# Patient Record
Sex: Female | Born: 1950
Health system: Southern US, Community
[De-identification: ages and names within clinical notes are randomized; demographics above are authoritative.]

## PROBLEM LIST (undated history)

## (undated) DIAGNOSIS — H409 Unspecified glaucoma: Secondary | ICD-10-CM

## (undated) DIAGNOSIS — G709 Myoneural disorder, unspecified: Secondary | ICD-10-CM

## (undated) DIAGNOSIS — M19042 Primary osteoarthritis, left hand: Secondary | ICD-10-CM

## (undated) DIAGNOSIS — I471 Supraventricular tachycardia: Secondary | ICD-10-CM

## (undated) DIAGNOSIS — M19041 Primary osteoarthritis, right hand: Secondary | ICD-10-CM

## (undated) DIAGNOSIS — D869 Sarcoidosis, unspecified: Secondary | ICD-10-CM

## (undated) DIAGNOSIS — M170 Bilateral primary osteoarthritis of knee: Secondary | ICD-10-CM

## (undated) DIAGNOSIS — R000 Tachycardia, unspecified: Secondary | ICD-10-CM

## (undated) DIAGNOSIS — J45909 Unspecified asthma, uncomplicated: Secondary | ICD-10-CM

## (undated) DIAGNOSIS — H3320 Serous retinal detachment, unspecified eye: Secondary | ICD-10-CM

## (undated) DIAGNOSIS — J9801 Acute bronchospasm: Principal | ICD-10-CM

## (undated) DIAGNOSIS — H209 Unspecified iridocyclitis: Secondary | ICD-10-CM

## (undated) DIAGNOSIS — I10 Essential (primary) hypertension: Secondary | ICD-10-CM

## (undated) DIAGNOSIS — T7840XA Allergy, unspecified, initial encounter: Secondary | ICD-10-CM

## (undated) DIAGNOSIS — M5136 Other intervertebral disc degeneration, lumbar region: Secondary | ICD-10-CM

## (undated) DIAGNOSIS — K219 Gastro-esophageal reflux disease without esophagitis: Secondary | ICD-10-CM

## (undated) DIAGNOSIS — I5189 Other ill-defined heart diseases: Secondary | ICD-10-CM

## (undated) HISTORY — DX: Unspecified glaucoma: H40.9

## (undated) HISTORY — DX: Serous retinal detachment, unspecified eye: H33.20

## (undated) HISTORY — DX: Tachycardia, unspecified: R00.0

## (undated) HISTORY — DX: Sarcoidosis, unspecified: D86.9

## (undated) HISTORY — DX: Myoneural disorder, unspecified: G70.9

## (undated) HISTORY — DX: Other intervertebral disc degeneration, lumbar region: M51.36

## (undated) HISTORY — DX: Primary osteoarthritis, right hand: M19.041

## (undated) HISTORY — DX: Primary osteoarthritis, left hand: M19.042

## (undated) HISTORY — PX: CYSTECTOMY: SUR359

## (undated) HISTORY — DX: Acute bronchospasm: J98.01

## (undated) HISTORY — DX: Supraventricular tachycardia: I47.1

## (undated) HISTORY — DX: Bilateral primary osteoarthritis of knee: M17.0

## (undated) HISTORY — DX: Unspecified iridocyclitis: H20.9

## (undated) HISTORY — DX: Unspecified asthma, uncomplicated: J45.909

## (undated) HISTORY — DX: Essential (primary) hypertension: I10

## (undated) HISTORY — DX: Gastro-esophageal reflux disease without esophagitis: K21.9

## (undated) HISTORY — DX: Allergy, unspecified, initial encounter: T78.40XA

## (undated) HISTORY — DX: Other ill-defined heart diseases: I51.89

---
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
## 1982-10-19 HISTORY — PX: TOTAL ABDOMINAL HYSTERECTOMY: SHX209

## 2000-09-20 ENCOUNTER — Other Ambulatory Visit: Admission: RE | Admit: 2000-09-20 | Discharge: 2000-09-20 | Payer: Self-pay | Admitting: *Deleted

## 2001-01-26 ENCOUNTER — Emergency Department (HOSPITAL_COMMUNITY): Admission: EM | Admit: 2001-01-26 | Discharge: 2001-01-27 | Payer: Self-pay | Admitting: Emergency Medicine

## 2001-01-27 ENCOUNTER — Encounter: Payer: Self-pay | Admitting: Emergency Medicine

## 2001-03-18 ENCOUNTER — Emergency Department (HOSPITAL_COMMUNITY): Admission: EM | Admit: 2001-03-18 | Discharge: 2001-03-18 | Payer: Self-pay | Admitting: Emergency Medicine

## 2001-06-29 ENCOUNTER — Encounter: Payer: Self-pay | Admitting: Chiropractic Medicine

## 2001-06-29 ENCOUNTER — Ambulatory Visit (HOSPITAL_COMMUNITY): Admission: RE | Admit: 2001-06-29 | Discharge: 2001-06-29 | Payer: Self-pay | Admitting: Chiropractic Medicine

## 2003-12-10 ENCOUNTER — Encounter
Admission: RE | Admit: 2003-12-10 | Discharge: 2004-03-09 | Payer: Self-pay | Admitting: Physical Medicine and Rehabilitation

## 2003-12-17 ENCOUNTER — Ambulatory Visit (HOSPITAL_COMMUNITY)
Admission: RE | Admit: 2003-12-17 | Discharge: 2003-12-17 | Payer: Self-pay | Admitting: Physical Medicine and Rehabilitation

## 2004-03-18 ENCOUNTER — Encounter
Admission: RE | Admit: 2004-03-18 | Discharge: 2004-06-16 | Payer: Self-pay | Admitting: Physical Medicine and Rehabilitation

## 2004-07-02 ENCOUNTER — Encounter
Admission: RE | Admit: 2004-07-02 | Discharge: 2004-09-30 | Payer: Self-pay | Admitting: Physical Medicine and Rehabilitation

## 2004-07-03 ENCOUNTER — Ambulatory Visit: Payer: Self-pay | Admitting: Physical Medicine & Rehabilitation

## 2004-08-07 ENCOUNTER — Ambulatory Visit: Payer: Self-pay | Admitting: Physical Medicine and Rehabilitation

## 2004-08-19 ENCOUNTER — Encounter
Admission: RE | Admit: 2004-08-19 | Discharge: 2004-11-17 | Payer: Self-pay | Admitting: Physical Medicine and Rehabilitation

## 2004-10-29 ENCOUNTER — Encounter
Admission: RE | Admit: 2004-10-29 | Discharge: 2005-01-27 | Payer: Self-pay | Admitting: Physical Medicine and Rehabilitation

## 2004-10-31 ENCOUNTER — Ambulatory Visit: Payer: Self-pay | Admitting: Physical Medicine and Rehabilitation

## 2004-11-11 ENCOUNTER — Encounter
Admission: RE | Admit: 2004-11-11 | Discharge: 2004-11-11 | Payer: Self-pay | Admitting: Physical Medicine and Rehabilitation

## 2005-02-24 ENCOUNTER — Encounter
Admission: RE | Admit: 2005-02-24 | Discharge: 2005-05-25 | Payer: Self-pay | Admitting: Physical Medicine and Rehabilitation

## 2005-02-26 ENCOUNTER — Ambulatory Visit: Payer: Self-pay | Admitting: Physical Medicine and Rehabilitation

## 2005-02-26 ENCOUNTER — Ambulatory Visit: Payer: Self-pay | Admitting: Internal Medicine

## 2005-03-05 ENCOUNTER — Ambulatory Visit (HOSPITAL_COMMUNITY): Admission: RE | Admit: 2005-03-05 | Discharge: 2005-03-05 | Payer: Self-pay | Admitting: Internal Medicine

## 2005-03-19 ENCOUNTER — Ambulatory Visit: Payer: Self-pay | Admitting: Internal Medicine

## 2005-04-29 ENCOUNTER — Ambulatory Visit: Payer: Self-pay | Admitting: Internal Medicine

## 2005-05-06 ENCOUNTER — Ambulatory Visit: Payer: Self-pay

## 2005-05-18 ENCOUNTER — Encounter
Admission: RE | Admit: 2005-05-18 | Discharge: 2005-08-16 | Payer: Self-pay | Admitting: Physical Medicine and Rehabilitation

## 2005-06-13 ENCOUNTER — Emergency Department (HOSPITAL_COMMUNITY): Admission: EM | Admit: 2005-06-13 | Discharge: 2005-06-13 | Payer: Self-pay | Admitting: Family Medicine

## 2005-07-08 ENCOUNTER — Ambulatory Visit: Payer: Self-pay | Admitting: Physical Medicine and Rehabilitation

## 2005-09-18 ENCOUNTER — Ambulatory Visit: Payer: Self-pay | Admitting: Internal Medicine

## 2005-10-01 ENCOUNTER — Encounter
Admission: RE | Admit: 2005-10-01 | Discharge: 2005-12-30 | Payer: Self-pay | Admitting: Physical Medicine and Rehabilitation

## 2005-12-01 ENCOUNTER — Ambulatory Visit: Payer: Self-pay | Admitting: Physical Medicine and Rehabilitation

## 2006-01-07 ENCOUNTER — Emergency Department (HOSPITAL_COMMUNITY): Admission: EM | Admit: 2006-01-07 | Discharge: 2006-01-07 | Payer: Self-pay | Admitting: Emergency Medicine

## 2006-02-23 ENCOUNTER — Encounter
Admission: RE | Admit: 2006-02-23 | Discharge: 2006-05-24 | Payer: Self-pay | Admitting: Physical Medicine and Rehabilitation

## 2006-02-23 ENCOUNTER — Ambulatory Visit: Payer: Self-pay | Admitting: Physical Medicine and Rehabilitation

## 2006-04-03 ENCOUNTER — Emergency Department (HOSPITAL_COMMUNITY): Admission: EM | Admit: 2006-04-03 | Discharge: 2006-04-03 | Payer: Self-pay | Admitting: Emergency Medicine

## 2006-04-14 ENCOUNTER — Ambulatory Visit: Payer: Self-pay | Admitting: Physical Medicine and Rehabilitation

## 2006-05-25 ENCOUNTER — Encounter
Admission: RE | Admit: 2006-05-25 | Discharge: 2006-08-23 | Payer: Self-pay | Admitting: Physical Medicine & Rehabilitation

## 2007-10-20 HISTORY — PX: LUMBAR SPINE SURGERY: SHX701

## 2008-10-15 ENCOUNTER — Encounter: Admission: RE | Admit: 2008-10-15 | Discharge: 2008-10-15 | Payer: Self-pay | Admitting: Orthopedic Surgery

## 2008-12-27 ENCOUNTER — Ambulatory Visit (HOSPITAL_COMMUNITY): Admission: RE | Admit: 2008-12-27 | Discharge: 2008-12-28 | Payer: Self-pay | Admitting: Orthopedic Surgery

## 2009-03-02 ENCOUNTER — Ambulatory Visit: Payer: Self-pay | Admitting: Diagnostic Radiology

## 2009-03-02 ENCOUNTER — Emergency Department (HOSPITAL_BASED_OUTPATIENT_CLINIC_OR_DEPARTMENT_OTHER): Admission: EM | Admit: 2009-03-02 | Discharge: 2009-03-02 | Payer: Self-pay | Admitting: Emergency Medicine

## 2009-06-07 ENCOUNTER — Emergency Department (HOSPITAL_BASED_OUTPATIENT_CLINIC_OR_DEPARTMENT_OTHER): Admission: EM | Admit: 2009-06-07 | Discharge: 2009-06-07 | Payer: Self-pay | Admitting: Emergency Medicine

## 2009-10-19 HISTORY — PX: ROTATOR CUFF REPAIR: SHX139

## 2010-11-08 ENCOUNTER — Encounter: Payer: Self-pay | Admitting: Physical Medicine and Rehabilitation

## 2011-01-29 LAB — URINALYSIS, ROUTINE W REFLEX MICROSCOPIC
Nitrite: NEGATIVE
Protein, ur: NEGATIVE mg/dL
Urobilinogen, UA: 1 mg/dL (ref 0.0–1.0)

## 2011-01-29 LAB — PROTIME-INR
INR: 0.9 (ref 0.00–1.49)
Prothrombin Time: 11.9 seconds (ref 11.6–15.2)

## 2011-01-29 LAB — COMPREHENSIVE METABOLIC PANEL
AST: 24 U/L (ref 0–37)
CO2: 27 mEq/L (ref 19–32)
Calcium: 9.4 mg/dL (ref 8.4–10.5)
Creatinine, Ser: 0.77 mg/dL (ref 0.4–1.2)
GFR calc Af Amer: >60 mL/min (ref >60)
GFR calc non Af Amer: >60 mL/min (ref >60)

## 2011-01-29 LAB — DIFFERENTIAL
Eosinophils Relative: 1 % (ref 0–5)
Lymphocytes Relative: 33 % (ref 12–46)
Lymphs Abs: 2 10*3/uL (ref 0.7–4.0)

## 2011-01-29 LAB — URINE CULTURE
Colony Count: 4000
Special Requests: NEGATIVE

## 2011-01-29 LAB — CBC
MCHC: 33.5 g/dL (ref 30.0–36.0)
MCV: 88.8 fL (ref 78.0–100.0)
Platelets: 299 10*3/uL (ref 150–400)
RBC: 4.53 MIL/uL (ref 3.87–5.11)

## 2011-01-29 LAB — APTT: aPTT: 26 seconds (ref 24–37)

## 2011-03-03 NOTE — Op Note (Signed)
NAMEKARNISHA, LEFEBRE       ACCOUNT NO.:  [id number]   MEDICAL RECORD NO.:  [id number]          PATIENT TYPE:  AMB   LOCATION:  DAY                          FACILITY:  [facility]   PHYSICIAN:  Georges Lynch. Gioffre, M.D.DATE OF BIRTH:  [birth year]   DATE OF PROCEDURE:  12/27/2008  DATE OF DISCHARGE:                               OPERATIVE REPORT   SURGEON:  Georges Lynch. Darrelyn Hillock, M.D.   ASSISTANT:  Jene Every, M.D.   PREOPERATIVE DIAGNOSES:  Spinal stenosis at L4-5 with severe left leg  pain only.  Note there was a questionable ganglion cyst.   POSTOPERATIVE DIAGNOSES:  Spinal stenosis at L4-5 with severe left leg  pain only.  Note there was a questionable ganglion cyst.  Note, spinal  stenosis at L4-5 with facet overgrowth, and there was no ganglion cyst.   OPERATION:  1. Decompressive lumbar laminectomy at L4-5.  2. Foraminotomies at L4-5 of the 4 and 5 roots.   PROCEDURE:  Under general anesthesia, routine orthopedic prep and  draping of the lower back carried out.  She had 1 g of IV Ancef.  Two  needles were placed in the back for localization purposes and an x-ray  was taken.  At this time an incision was made over the L4-5, L5-S1  space.  Bleeders were identified and cauterized.  The muscle was  stripped from the lamina at the spinous process bilaterally.  Another x-  ray was taken to verify the position.  I then inserted McCullough  retractors and identified the L4 and the L5 space and I then carried out  a central decompressive lumbar laminectomy at L4-5 in the usual fashion.  I brought the microscope in, went down and excised the ligamentum  flavum.  Great care was taken not to injure the underlying dura.  Note,  we first went far out the lateral recess to make sure we had a nice  decompression.  She had marked facet overgrowth at that time.  There was  no ganglion cyst that was thought to the earlier.  I think this is just  a defect from the facet.  Once we had totally  decompressed it  bilaterally, we did foraminotomies.  There was a question about going  down to L5-S1 that appeared on 1 view to be a small defect tear, but on  the other views the S1 root filled out quite nicely, but we were able to  easily pass a hockey-stick down after we did our decompression, all the  way down to the S1 root, and it was totally free, so we did not go into  the L5-S1 space on the left.  We went up proximally, distally, medially  and laterally with a hockey-stick.  We explored the disk.  There was no  disk herniation, and we thoroughly irrigated out the area of the dura  now and roots were free, and we felt that that was a good decompression  that we did at this time, but we did take an x-ray to make sure we were  at the 4-5 space.  That was the third x-ray.  Thoroughly irrigated out  the area,  loosely applied some thrombin-soaked Gelfoam.  I closed the  wound in layers in the usual fashion, except I left the  small deep distal part of the wound open for drainage purposes.  The  skin was closed with metal staples.  Sterile Neosporin dressing was  applied.  The patient left the operating room in satisfactory condition.  Surgeon Dr. Darrelyn Hillock, assistant Dr. Jene Every.           ______________________________  Georges Lynch. Darrelyn Hillock, M.D.     RAG/MEDQ  D:  12/27/2008  T:  12/27/2008  Job:  [id number]

## 2011-03-06 NOTE — Assessment & Plan Note (Signed)
HISTORY:  [name] is a 60 year old black female who is being seen in our  pain and rehabilitative clinic for symptoms related to mild spinal stenosis,  facet arthropathy, short pedicles.   She also has bilateral, predominately left knee pain for which she is being  seen by Dr. Cornelius Moras over at [facility].   She states she has recently had two knee injections.  They have helped her  somewhat.  Also, she has had MRI of bilateral knees and was told she has  arthritis without any meniscal issues, apparently.  This is per her history  today.  I do not have notes regarding this from Dr. Cornelius Moras.   Her average pain is about a 6, mainly localized to the low back, slightly  into the left buttock and hip region.  Pain is typically worse with walking,  standing, sitting.  She gets fair relief with the current meds that she is  on.  Pain is described as aching, dull in nature.   She can walk about 60 minutes at a time.  She is able to drive.  She is  working 40 hours a week.   No new changes in past medical, social, or family history since our last  visit.   EXAMINATION:  VITAL SIGNS:  Blood pressure 140/88, pulse 90, respirations  16, 100% saturation on room air.  GENERAL:  She is a well-developed, well-nourished female who appears her  stated age.  She is oriented x 3.  Her affect is alert, cooperative, and  pleasant.   She transitions from sit to stand without any difficulty.  Her gait is  stable in the room.  Normal heel-toe mechanics are appreciated.   Limitations are noted in lumbar range of motion in all planes.   Reflexes are symmetric and intact in the upper and lower extremities.  Motor  strength is good in the lower extremities.  Straight leg raise is negative.  No abnormal sensory deficits are appreciated.   IMPRESSION:  1.  Chronic low back pain.  2.  History of early spinal stenosis, facet arthropathy, short pedicles.  3.  No evidence of trochanteric bursitis  today.   PLAN:  Will continue the patient on Ultracet on a p.r.n. basis.  She really  takes Ultracet rather infrequently, up to two times a week, and  amitriptyline she uses about twice a week, as well.  She does use a heating  pad.  She states TENS unit has not worked well for her and she does  occasionally use Lidoderm.   For the low back and hip pain, she has undergone L4 epidural injection in  the past.  Her last injection was approximately two years ago.  She is  requesting repeat injection and we will go ahead and have this set up for  her for that left buttock and hip pain.  Will see her back in two months.           ______________________________  Brantley Stage, M.D.    DMK/MedQ  D:  04/19/2006 11:25:02  T:  04/19/2006 11:59:34  Job #:  [id number]

## 2011-03-06 NOTE — Assessment & Plan Note (Signed)
MEDICAL RECORD NUMBER:  [id number]   DATE OF BIRTH:  05-27-1951   REASON FOR EVALUATION:  [name] is a 60 year old black female who is being  seen in our pain and rehabilitative clinic for symptoms related to her mild  spinal stenosis, facet arthropathy, short pedicles; she also has some knee  pain which is being worked up currently by Dr. Charlann Boxer over at [facility].  She has recently had a steroid injection to the knee.   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   She is requesting just a refill on her medications.  She has been using  Lidoderm, occasionally some Elavil, as well as an occasional tramadol.  Over  the last couple of weeks, she has experienced a bit of a worsening in her  low back and left buttock symptoms, averaging about an 8 on a scale of 10.  She describes it as constant aching.  Pain is typically worse when she is up  walking and standing, improves a bit when she is at rest.   FUNCTIONAL STATUS:  Her functional status is very high.  She is independent  with all of her ADLs.  She works 40 hours a week; she is a Child psychotherapist.  She is able to climb stairs and drive.   REVIEW OF SYSTEMS:  No problems regarding review of systems today.   PAST MEDICAL HISTORY:  No new changes since her last visit, which was  December 02, 2005.   SOCIAL HISTORY:  No new changes since her last visit, which was December 02, 2005.   FAMILY HISTORY:  No new changes since her last visit, which was December 02, 2005.   PHYSICAL EXAMINATION:  VITAL SIGNS:  On exam today, blood pressure 110/61,  pulse 86, respirations 16, saturated 99% on room air.  GENERAL:  She is a pleasant black female who appears her stated age.  She is  oriented x3.  Her affect is quite bright, very personable and appropriate.  NEUROMUSCULAR:  She transitions from sit to stand without any difficulty.  She does not display any antalgic gait today.  She  has mild limitations in  lumbar range of motion with extension and end-range forward flexion.  Lateral flexion does not seem to bother her today.   Seated reflexes are 1+ at the patellar tendons, 0 to 1+ at the Achilles  tendons.  No abnormal tone is noted.  Motor strength is good in both lower  extremities.   IMPRESSION:  1.  Chronic low back pain.  2.  History of early spinal stenosis, facet arthropathy, short pedicles.  3.  No evidence of significant trochanteric bursitis or iliotibial band      syndrome on exam today.  No tenderness was palpated over the trochanter      on either right or left.   PLAN:  I will refill the patient's Ultracet.  We will increase it slightly,  one to two p.o. b.i.d. p.r.n. low back and left hip pain, #120.  We will see  her back in 2 months.  If she has continuing worsening in her pain  inventory, may consider epidural injections; she will assess this over the  upcoming months.           ______________________________  Brantley Stage, M.D.     DMK/MedQ  D:  02/24/2006 09:28:55  T:  02/25/2006 05:45:30  Job #:  [id number]

## 2011-06-01 ENCOUNTER — Ambulatory Visit (INDEPENDENT_AMBULATORY_CARE_PROVIDER_SITE_OTHER): Payer: Self-pay

## 2011-06-01 DIAGNOSIS — K5900 Constipation, unspecified: Secondary | ICD-10-CM

## 2011-06-15 ENCOUNTER — Ambulatory Visit (INDEPENDENT_AMBULATORY_CARE_PROVIDER_SITE_OTHER): Payer: Self-pay

## 2011-06-15 DIAGNOSIS — K5900 Constipation, unspecified: Secondary | ICD-10-CM

## 2011-07-13 ENCOUNTER — Ambulatory Visit: Payer: Self-pay

## 2011-07-20 ENCOUNTER — Other Ambulatory Visit: Payer: Self-pay | Admitting: Gastroenterology

## 2011-07-27 ENCOUNTER — Ambulatory Visit
Admission: RE | Admit: 2011-07-27 | Discharge: 2011-07-27 | Disposition: A | Discharge status: Home / Self Care | Payer: 59 | Source: Ambulatory Visit | Attending: Gastroenterology | Admitting: Gastroenterology

## 2011-08-10 ENCOUNTER — Ambulatory Visit (INDEPENDENT_AMBULATORY_CARE_PROVIDER_SITE_OTHER): Payer: Self-pay

## 2011-08-10 DIAGNOSIS — K5900 Constipation, unspecified: Secondary | ICD-10-CM

## 2011-09-07 ENCOUNTER — Ambulatory Visit (INDEPENDENT_AMBULATORY_CARE_PROVIDER_SITE_OTHER): Payer: Self-pay

## 2011-09-07 DIAGNOSIS — K5900 Constipation, unspecified: Secondary | ICD-10-CM

## 2011-09-21 ENCOUNTER — Ambulatory Visit (INDEPENDENT_AMBULATORY_CARE_PROVIDER_SITE_OTHER): Payer: Self-pay

## 2011-09-21 DIAGNOSIS — K5900 Constipation, unspecified: Secondary | ICD-10-CM

## 2011-11-09 ENCOUNTER — Ambulatory Visit: Payer: Self-pay

## 2011-11-16 ENCOUNTER — Ambulatory Visit (INDEPENDENT_AMBULATORY_CARE_PROVIDER_SITE_OTHER): Payer: Self-pay

## 2011-11-16 DIAGNOSIS — K5900 Constipation, unspecified: Secondary | ICD-10-CM

## 2011-11-30 ENCOUNTER — Ambulatory Visit: Payer: Self-pay

## 2011-12-14 ENCOUNTER — Ambulatory Visit: Payer: Self-pay

## 2011-12-28 ENCOUNTER — Ambulatory Visit: Payer: Self-pay

## 2012-01-25 ENCOUNTER — Other Ambulatory Visit: Payer: Self-pay

## 2012-02-22 ENCOUNTER — Ambulatory Visit: Payer: Self-pay

## 2012-04-13 ENCOUNTER — Emergency Department (HOSPITAL_BASED_OUTPATIENT_CLINIC_OR_DEPARTMENT_OTHER)
Admission: EM | Admit: 2012-04-13 | Discharge: 2012-04-13 | Disposition: A | Discharge status: Home / Self Care | Payer: 59 | Attending: Emergency Medicine | Admitting: Emergency Medicine

## 2012-04-13 ENCOUNTER — Encounter (HOSPITAL_BASED_OUTPATIENT_CLINIC_OR_DEPARTMENT_OTHER): Payer: Self-pay | Admitting: *Deleted

## 2012-04-13 ENCOUNTER — Emergency Department (HOSPITAL_BASED_OUTPATIENT_CLINIC_OR_DEPARTMENT_OTHER): Payer: 59

## 2012-04-13 DIAGNOSIS — F411 Generalized anxiety disorder: Secondary | ICD-10-CM | POA: Insufficient documentation

## 2012-04-13 DIAGNOSIS — R079 Chest pain, unspecified: Secondary | ICD-10-CM | POA: Insufficient documentation

## 2012-04-13 DIAGNOSIS — R0789 Other chest pain: Secondary | ICD-10-CM | POA: Insufficient documentation

## 2012-04-13 DIAGNOSIS — R000 Tachycardia, unspecified: Secondary | ICD-10-CM | POA: Insufficient documentation

## 2012-04-13 DIAGNOSIS — R911 Solitary pulmonary nodule: Secondary | ICD-10-CM | POA: Insufficient documentation

## 2012-04-13 LAB — URINALYSIS, ROUTINE W REFLEX MICROSCOPIC
Bilirubin Urine: NEGATIVE
Glucose, UA: NEGATIVE mg/dL
Hgb urine dipstick: NEGATIVE
Ketones, ur: NEGATIVE mg/dL
Protein, ur: NEGATIVE mg/dL
pH: 5.5 (ref 5.0–8.0)

## 2012-04-13 LAB — COMPREHENSIVE METABOLIC PANEL
ALT: 17 U/L (ref 0–35)
Albumin: 4.6 g/dL (ref 3.5–5.2)
Alkaline Phosphatase: 69 U/L (ref 39–117)
BUN: 12 mg/dL (ref 6–23)
Chloride: 105 mEq/L (ref 96–112)
Potassium: 3.7 mEq/L (ref 3.5–5.1)
Sodium: 140 mEq/L (ref 135–145)
Total Bilirubin: 0.3 mg/dL (ref 0.3–1.2)
Total Protein: 7.6 g/dL (ref 6.0–8.3)

## 2012-04-13 LAB — CBC
HCT: 36.4 % (ref 36.0–46.0)
Hemoglobin: 12.5 g/dL (ref 12.0–15.0)
RDW: 15.1 % (ref 11.5–15.5)
WBC: 8.1 10*3/uL (ref 4.0–10.5)

## 2012-04-13 MED ORDER — NITROGLYCERIN 0.4 MG SL SUBL
0.4000 mg | SUBLINGUAL_TABLET | Freq: Once | SUBLINGUAL | Status: AC
  Administered 2012-04-13: 0.4 mg via SUBLINGUAL
  Filled 2012-04-13: qty 25

## 2012-04-13 MED ORDER — SODIUM CHLORIDE 0.9 % IV BOLUS (SEPSIS)
1000.0000 mL | Freq: Once | INTRAVENOUS | Status: AC
  Administered 2012-04-13: 1000 mL via INTRAVENOUS

## 2012-04-13 MED ORDER — ASPIRIN 81 MG PO CHEW
324.0000 mg | CHEWABLE_TABLET | Freq: Once | ORAL | Status: AC
  Administered 2012-04-13: 324 mg via ORAL
  Filled 2012-04-13: qty 4

## 2012-04-13 MED ORDER — IOHEXOL 350 MG/ML SOLN
80.0000 mL | Freq: Once | INTRAVENOUS | Status: AC | PRN
  Administered 2012-04-13: 80 mL via INTRAVENOUS

## 2012-04-13 MED ORDER — NITROGLYCERIN 0.4 MG SL SUBL
0.4000 mg | SUBLINGUAL_TABLET | SUBLINGUAL | Status: DC | PRN
  Administered 2012-04-13: 0.4 mg via SUBLINGUAL
  Filled 2012-04-13: qty 25

## 2012-04-13 NOTE — Discharge Instructions (Signed)
Chest Pain (Nonspecific) It is often hard to give a specific diagnosis for the cause of chest pain. There is always a chance that your pain could be related to something serious, such as a heart attack or a blood clot in the lungs. You need to follow up with your caregiver for further evaluation. CAUSES   Heartburn.   Pneumonia or bronchitis.   Anxiety or stress.   Inflammation around your heart (pericarditis) or lung (pleuritis or pleurisy).   A blood clot in the lung.   A collapsed lung (pneumothorax). It can develop suddenly on its own (spontaneous pneumothorax) or from injury (trauma) to the chest.   Shingles infection (herpes zoster virus).  The chest wall is composed of bones, muscles, and cartilage. Any of these can be the source of the pain.  The bones can be bruised by injury.   The muscles or cartilage can be strained by coughing or overwork.   The cartilage can be affected by inflammation and become sore (costochondritis).  DIAGNOSIS  Lab tests or other studies, such as X-rays, electrocardiography, stress testing, or cardiac imaging, may be needed to find the cause of your pain.  TREATMENT   Treatment depends on what may be causing your chest pain. Treatment may include:   Acid blockers for heartburn.   Anti-inflammatory medicine.   Pain medicine for inflammatory conditions.   Antibiotics if an infection is present.   You may be advised to change lifestyle habits. This includes stopping smoking and avoiding alcohol, caffeine, and chocolate.   You may be advised to keep your head raised (elevated) when sleeping. This reduces the chance of acid going backward from your stomach into your esophagus.   Most of the time, nonspecific chest pain will improve within 2 to 3 days with rest and mild pain medicine.  HOME CARE INSTRUCTIONS   If antibiotics were prescribed, take your antibiotics as directed. Finish them even if you start to feel better.   For the next few  days, avoid physical activities that bring on chest pain. Continue physical activities as directed.   Do not smoke.   Avoid drinking alcohol.   Only take over-the-counter or prescription medicine for pain, discomfort, or fever as directed by your caregiver.   Follow your caregiver's suggestions for further testing if your chest pain does not go away.   Keep any follow-up appointments you made. If you do not go to an appointment, you could develop lasting (chronic) problems with pain. If there is any problem keeping an appointment, you must call to reschedule.  SEEK MEDICAL CARE IF:   You think you are having problems from the medicine you are taking. Read your medicine instructions carefully.   Your chest pain does not go away, even after treatment.   You develop a rash with blisters on your chest.  SEEK IMMEDIATE MEDICAL CARE IF:   You have increased chest pain or pain that spreads to your arm, neck, jaw, back, or abdomen.   You develop shortness of breath, an increasing cough, or you are coughing up blood.   You have severe back or abdominal pain, feel nauseous, or vomit.   You develop severe weakness, fainting, or chills.   You have a fever.  THIS IS AN EMERGENCY. Do not wait to see if the pain will go away. Get medical help at once. Call your local emergency services (911 in [place] Do not drive yourself to the hospital. MAKE SURE YOU:   Understand these instructions.     Will watch your condition.   Will get help right away if you are not doing well or get worse.  Document Released: 07/15/2005 Document Revised: 09/24/2011 Document Reviewed: 05/10/2008 [employer] Patient Information 2012 [employer]. 

## 2012-04-13 NOTE — ED Notes (Signed)
Pt reports chest pain and the sensation of her heart beating fast x 3 days. Pain is intermittant, with some sensation of "like I can't catch my breath". ekg performed while pt being triaged. Dr. Alto Denver at bedside for exam.

## 2012-04-13 NOTE — ED Provider Notes (Signed)
History     CSN: [id number]  Arrival date & time 04/13/12  1239   First MD Initiated Contact with Patient 04/13/12 1256      Chief Complaint  Patient presents with  . Tachycardia  . Chest Pain    (Consider location/radiation/quality/duration/timing/severity/associated sxs/prior treatment) HPI Patient is a [age]-year-old female who presents today complaining of 3 days of intermittent episodes of chest discomfort localized underneath the left breast. Patient reports this is worse with a deep breath. Episodes last only a few minutes. She has history of hypertension but no history of hypercholesterolemia, diabetes, or CAD. Patient has seen a cardiologist in the past for tachycardia but this is under control at this time. Patient reports that normally she has these episodes she is told to come to the cardiologist if they're greater than 125. Normally patient can perform Valsalva maneuver and these episodes stopped. Patient has not had a previous stress test or catheterization. She is unaware of her family history she is adopted. She's not a smoker or former smoker. Patient has no history of DVT or PE. She's not on exogenous estrogen. Patient is not on anything that makes her symptoms better and she reports that they feel mainly like she has some gas in her stomach that she denies abdominal pain, nausea, or vomiting. Patient has tried TUMS in addition to her dexilent for GERD without resolution.There are no other associated or modifying factors.  Past Medical History  Diagnosis Date  . Diabetes mellitus diet controlled per pt.    History reviewed. No pertinent past surgical history.  No family history on file.  History  Substance Use Topics  . Smoking status: Never Smoker   . Smokeless tobacco: Not on file  . Alcohol Use:     OB History    Grav Para Term Preterm Abortions TAB SAB Ect Mult Living                  Review of Systems  Constitutional: Negative.   HENT: Negative.     Eyes: Negative.   Respiratory: Negative.   Cardiovascular: Positive for chest pain.  Gastrointestinal: Negative.   Genitourinary: Negative.   Musculoskeletal: Negative.   Skin: Negative.   Neurological: Negative.   Hematological: Negative.   Psychiatric/Behavioral: The patient is nervous/anxious.   All other systems reviewed and are negative.    Allergies  Codeine; Erythromycin; and Sulfa antibiotics  Home Medications   Current Outpatient Rx  Name Route Sig Dispense Refill  . DEXLANSOPRAZOLE 60 MG PO CPDR Oral Take 60 mg by mouth daily.    Marland Kitchen DOXYCYCLINE HYCLATE 100 MG PO CPEP Oral Take 100 mg by mouth 2 (two) times daily.    Marland Kitchen NAPROXEN SODIUM 220 MG PO TABS Oral Take 220 mg by mouth 2 (two) times daily with a meal. Back pain    . OLMESARTAN MEDOXOMIL 20 MG PO TABS Oral Take 20 mg by mouth daily.    . TRAMADOL HCL 50 MG PO TABS Oral Take 50 mg by mouth every 6 (six) hours as needed. Back pain      BP 156/74  Pulse 97  Temp 97.8 F (36.6 C) (Oral)  Resp 18  Ht 5\' 4"  (1.626 m)  Wt 143 lb (64.864 kg)  BMI 24.55 kg/m2  SpO2 100%  Physical Exam  Nursing note and vitals reviewed. GEN: Well-developed, well-nourished female in no distress HEENT: Atraumatic, normocephalic.  EYES: PERRLA BL, no scleral icterus. NECK: Trachea midline, no meningismus CV: Though tachycardic at triage  patient is no longer tachycardic and has regular rhythm. No murmurs, rubs, or gallops PULM: No respiratory distress.  No crackles, wheezes, or rales. GI: soft, non-tender. No guarding, rebound, or tenderness. + bowel sounds  GU: deferred Neuro: cranial nerves grossly 2-12 intact, no abnormalities of strength or sensation, A and O x 3 MSK: Patient moves all 4 extremities symmetrically, no deformity, edema, or injury noted Skin: No rashes petechiae, purpura, or jaundice Psych: Patient is anxious appearing and agrees that she is currently anxious.  ED Course  Procedures (including critical care  time)   Indication: Chest pain Please note this EKG was reviewed extemporaneously by myself.   Date: 04/13/2012  Rate: 118  Rhythm: sinus tachycardia  QRS Axis: normal  Intervals: normal  ST/T Wave abnormalities: nonspecific T wave changes  Conduction Disutrbances:none  Narrative Interpretation:   Old EKG Reviewed: none available       Labs Reviewed  COMPREHENSIVE METABOLIC PANEL - Abnormal; Notable for the following:    Glucose, Bld 105 (*)     GFR calc non Af Amer 79 (*)     All other components within normal limits  CBC  URINALYSIS, ROUTINE W REFLEX MICROSCOPIC  TROPONIN I  TROPONIN I   Dg Chest 2 View  04/13/2012  *RADIOLOGY REPORT*  Clinical Data: Left chest pain radiating to the upper back.  CHEST - 2 VIEW  Comparison: None.  Findings: Vague density in the right upper chest could represent a summation shadow or tissues of the right chest wall, and is less likely to represent an intrapulmonary lesion.  The left lung appears clear.  Cardiac and mediastinal contours appear unremarkable.  No pleural effusion noted.  IMPRESSION:  1.  Vague density projects over the right upper lobe on the frontal projection.  This is subtle and probably a summation shadow or due to soft tissues of the chest wall.  I do recommend an apical lordotic view of the chest tube confirm the benign/incidental nature of this density.  Original Report Authenticated By: Dellia Cloud, M.D.   Ct Angio Chest W/cm &/or Wo Cm  04/13/2012  *RADIOLOGY REPORT*  Clinical Data: Chest pain  CT ANGIOGRAPHY CHEST  Technique:  Multidetector CT imaging of the chest using the standard protocol during bolus administration of intravenous contrast. Multiplanar reconstructed images including MIPs were obtained and reviewed to evaluate the vascular anatomy.  Contrast: 80mL OMNIPAQUE IOHEXOL 350 MG/ML SOLN  Comparison: None.  Findings: There are no filling defects in the pulmonary arterial tree to suggest acute pulmonary  thromboembolism.  Sub centimeter short axis diameter mediastinal and hilar nodes are present. There is a 12 mm short axis diameter precarinal node.  11 mm short axis diameter subcarinal node.  Small axillary nodes are noted.  Small pleural nodes.    Small ill-defined round opacities in the posterior right upper lobe are noted.  This corresponds to the radiographic abnormality. Some of these areas are nodular.  None are greater than 5 mm.  Dependent atelectasis is associated towards the lung bases.  No acute bony deformity.  IMPRESSION: No evidence of acute pulmonary thromboembolism.  Ill-defined right upper lobe nodular opacities less than 5 mm and borderline mediastinal adenopathy.  Findings may represent atypical infection however noninfectious inflammatory disease such as sarcoidosis is in the differential diagnosis.  Follow-up scan in 6 months is recommended to ensure resolution of these findings. Further testing can be performed as clinically indicated.  Original Report Authenticated By: Donavan Burnet, M.D.  1. Atypical chest pain       MDM  Patient was evaluated by myself. Based on presentation patient did have workup for possible atypical chest pain. Patient had an EKG that was unremarkable aside from tachycardia. Tachycardia had resolved by the time I entered the room. Patient reported that her anxiety was improving at that point. Patient was given aspirin 325 mg by mouth. Patient was given a dose of nitroglycerin and her pain improved with this the patient felt that her "settling herself down" is actually what improve this. Patient required no additional medications. Laboratory workup was unremarkable with no evidence of leukocytosis or anemia. Patient also had normal renal function and negative troponins at 0 and 3 hours. Patient did have finding on chest x-ray was concerning for possible right upper lobe abnormality. Because patient describe some variation in her symptoms with deep breathing  and had somewhat atypical presentation CT and she was performed. This showed no evidence of pulmonary embolus but did show some concerning nodular areas as well as lymphadenopathy. Radiology recommended 6 month followup for this. I discussed this finding with patient notified her recommendation. Patient was told to followup on this with her primary care physician. Patient also has seen cardiology in the past. She was offered admission to CDU protocol for her stress testing but preferred to wait until return from a trip scheduled for tomorrow to [place]. Patient still had a slight twinge beneath her left breast but felt that her symptoms at all but do to anxiety. She understood that I cannot say this for certain without stress testing. Patient will followup with her primary care physician. Of note medical history currently with patient being a diabetic. We discussed this and patient has been told she is prediabetic. Patient was discharged in good condition.        Cyndra Numbers, MD 04/13/12 [phone]

## 2012-04-27 ENCOUNTER — Other Ambulatory Visit: Payer: Self-pay | Admitting: Family Medicine

## 2012-04-27 DIAGNOSIS — I889 Nonspecific lymphadenitis, unspecified: Secondary | ICD-10-CM

## 2012-05-02 ENCOUNTER — Ambulatory Visit
Admission: RE | Admit: 2012-05-02 | Discharge: 2012-05-02 | Disposition: A | Discharge status: Home / Self Care | Payer: 59 | Source: Ambulatory Visit | Attending: Family Medicine | Admitting: Family Medicine

## 2012-05-02 DIAGNOSIS — I889 Nonspecific lymphadenitis, unspecified: Secondary | ICD-10-CM

## 2012-05-02 MED ORDER — IOHEXOL 300 MG/ML  SOLN
75.0000 mL | Freq: Once | INTRAMUSCULAR | Status: AC | PRN
  Administered 2012-05-02: 75 mL via INTRAVENOUS

## 2012-05-16 ENCOUNTER — Other Ambulatory Visit: Payer: Self-pay | Admitting: Otolaryngology

## 2012-05-16 ENCOUNTER — Other Ambulatory Visit (HOSPITAL_COMMUNITY)
Admission: RE | Admit: 2012-05-16 | Discharge: 2012-05-16 | Disposition: A | Discharge status: Home / Self Care | Payer: 59 | Source: Ambulatory Visit | Attending: Otolaryngology | Admitting: Otolaryngology

## 2012-05-16 DIAGNOSIS — R599 Enlarged lymph nodes, unspecified: Secondary | ICD-10-CM | POA: Insufficient documentation

## 2012-05-23 ENCOUNTER — Encounter (INDEPENDENT_AMBULATORY_CARE_PROVIDER_SITE_OTHER): Payer: 59 | Admitting: Ophthalmology

## 2012-05-23 DIAGNOSIS — H30119 Disseminated chorioretinal inflammation of posterior pole, unspecified eye: Secondary | ICD-10-CM

## 2012-05-23 DIAGNOSIS — H35359 Cystoid macular degeneration, unspecified eye: Secondary | ICD-10-CM

## 2012-05-23 DIAGNOSIS — H43819 Vitreous degeneration, unspecified eye: Secondary | ICD-10-CM

## 2012-07-04 ENCOUNTER — Encounter (INDEPENDENT_AMBULATORY_CARE_PROVIDER_SITE_OTHER): Payer: 59 | Admitting: Ophthalmology

## 2012-07-04 DIAGNOSIS — H3020 Posterior cyclitis, unspecified eye: Secondary | ICD-10-CM

## 2012-07-04 DIAGNOSIS — H2510 Age-related nuclear cataract, unspecified eye: Secondary | ICD-10-CM

## 2012-07-04 DIAGNOSIS — H35359 Cystoid macular degeneration, unspecified eye: Secondary | ICD-10-CM

## 2012-07-04 DIAGNOSIS — H43819 Vitreous degeneration, unspecified eye: Secondary | ICD-10-CM

## 2012-07-22 ENCOUNTER — Other Ambulatory Visit: Payer: Self-pay | Admitting: Otolaryngology

## 2012-09-02 ENCOUNTER — Telehealth: Payer: Self-pay | Admitting: Internal Medicine

## 2012-09-02 NOTE — Telephone Encounter (Signed)
Received 4 pages of labs from [facility] Dr. Melvenia Needles sent up for Dr. Freida Gearing to review 09/02/2012 asw

## 2012-09-12 ENCOUNTER — Encounter (INDEPENDENT_AMBULATORY_CARE_PROVIDER_SITE_OTHER): Payer: 59 | Admitting: Ophthalmology

## 2012-09-12 DIAGNOSIS — H43819 Vitreous degeneration, unspecified eye: Secondary | ICD-10-CM

## 2012-09-12 DIAGNOSIS — H3020 Posterior cyclitis, unspecified eye: Secondary | ICD-10-CM

## 2012-09-12 DIAGNOSIS — H2510 Age-related nuclear cataract, unspecified eye: Secondary | ICD-10-CM

## 2012-09-29 ENCOUNTER — Encounter: Payer: Self-pay | Admitting: *Deleted

## 2012-09-30 ENCOUNTER — Encounter: Payer: Self-pay | Admitting: Internal Medicine

## 2012-09-30 ENCOUNTER — Ambulatory Visit (INDEPENDENT_AMBULATORY_CARE_PROVIDER_SITE_OTHER): Payer: 59 | Admitting: Internal Medicine

## 2012-09-30 VITALS — BP 120/80 | HR 86 | Temp 97.6°F | Ht 63.5 in | Wt 144.4 lb

## 2012-09-30 DIAGNOSIS — R000 Tachycardia, unspecified: Secondary | ICD-10-CM | POA: Insufficient documentation

## 2012-09-30 DIAGNOSIS — D869 Sarcoidosis, unspecified: Secondary | ICD-10-CM | POA: Insufficient documentation

## 2012-09-30 DIAGNOSIS — M1480 Arthropathies in other specified diseases classified elsewhere, unspecified site: Secondary | ICD-10-CM

## 2012-09-30 DIAGNOSIS — R918 Other nonspecific abnormal finding of lung field: Secondary | ICD-10-CM | POA: Insufficient documentation

## 2012-09-30 DIAGNOSIS — D8686 Sarcoid arthropathy: Secondary | ICD-10-CM | POA: Insufficient documentation

## 2012-09-30 HISTORY — DX: Other nonspecific abnormal finding of lung field: R91.8

## 2012-09-30 NOTE — Assessment & Plan Note (Signed)
CT June 2013: There is one pulm nodule < 5mm. She is non smoker. Odds of cancer of lung very very very low. Odds of benign nodule very high; sarcoid most likely. Too small to biopsy or to be cause of symptoms. Even if sarcoid, does not meet indication for prednisone/methotrexate from pulmonary standpoint. Will need CT chest July 2014 (1 year) to ensure nodule stability for cancer surveillance; ordered

## 2012-09-30 NOTE — Assessment & Plan Note (Signed)
Uveitis Arthropathy High ACE level + No evidence of pulmonary involvement of significance Cardiac sarcoid needs to be ruled out Will leave Rx decision to Dr Corliss Skains for arthropathy

## 2012-09-30 NOTE — Progress Notes (Signed)
Subjective:    Patient ID: Renee Jones, female    DOB: December 28, 1950, 61 y.o.   MRN: [id number]  HPI  PCP Beverley Fiedler, MD Eye - Dr Jerolyn Center Rheum - Dr Pollyann Savoy ENT -Dr Jearld Fenton Cardiologist - Dr Mendel Ryder  Body mass index is 25.18 kg/(m^2).  reports that she has never smoked. She does not have any smokeless tobacco history on file.    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   Recently diagnosed sarcoidosis  And Vit D Def-   June 2013 - chest pain. [facility] - Ct angio negative but showed small nodules Aug 2013 - Noted to have neck nodes but also eye issues. Uveitis diagnosed. Reportedly stage 3. Also, follows at [facility]. 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 Lab tests: Autoimmune negative per her hx but ACE level high. Plan to start Mtx without prednisone per hx and review of outside chart  Now - referred here.   Reports  fast heart rate x 2 years. On and off. Will happen suddenly. She can feel it. Started seeing Dr Katrinka Blazing x 2 years. Gives hx of holter and stress test a year ago was normal. Of note Dr Katrinka Blazing unaware of sarcoid diagnosis  Reports chest pains x July 2013. Went to ER and PE ruled out. Location is behind left breast and radiating to back. Stress test x July/Aug per hx was normal. Pain can happen anytime. There is associated chest tightness. Sometimes bad that has resulted in taking off from work. Feels like she has to take a deep breath. Improved by relaxing and resting. Brought on by being constantly fast paced.   Reports dyspnea x Aug 2013. Brought on by exertion like walking, climbing stairs or taking a shower or even talking too fast. Improved by keeping quiet and rest. Insidious onset. Constant. Stable since onset.  There is occ cough (mild and dry) No  associated wheezing, hemoptysis, B symptoms, edema . Walking desaturation test on 09/30/2012 185 feet x 3 laps:  did NOT desaturate. Rest pulse ox was 100%, final pulse ox was 100%. HR response 83/min at rest to 94/min at peak exertion. CT chest June 2013 - essentially clear lung fields except 1 small pulm nodule    Review of Systems  Constitutional: Negative for fever and unexpected weight change.  HENT: Negative for ear pain, nosebleeds, congestion, sore throat, rhinorrhea, sneezing, trouble swallowing, dental problem, postnasal drip and sinus pressure.   Eyes: Negative for redness and itching.  Respiratory: Positive for cough and shortness of breath. Negative for chest tightness and wheezing.   Cardiovascular: Positive for chest pain and palpitations. Negative for leg swelling.  Gastrointestinal: Negative for nausea and vomiting.  Genitourinary: Negative for dysuria.  Musculoskeletal: Positive for joint swelling.  Skin: Negative for rash.  Neurological: Negative for headaches.  Hematological: Does not bruise/bleed easily.  Psychiatric/Behavioral: Negative for dysphoric mood. The patient is nervous/anxious.        Objective:   Physical Exam  Vitals reviewed. Constitutional: She is oriented to person, place, and time. She appears well-developed and well-nourished. No distress.  HENT:  Head: Normocephalic and atraumatic.  Right Ear: External ear normal.  Left Ear: External ear normal.  Mouth/Throat: Oropharynx is clear and moist. No oropharyngeal exudate.  Eyes: Conjunctivae  normal and EOM are normal. Pupils are equal, round, and reactive to light. Right eye exhibits no discharge. Left eye exhibits no discharge. No scleral icterus.  Neck: Normal range of motion. Neck supple. No JVD present. No tracheal deviation present. No thyromegaly present.  Cardiovascular: Normal rate, regular rhythm, normal heart sounds and intact distal pulses.  Exam reveals no gallop and no friction rub.   No  murmur heard. Pulmonary/Chest: Effort normal and breath sounds normal. No respiratory distress. She has no wheezes. She has no rales. She exhibits no tenderness.  Abdominal: Soft. Bowel sounds are normal. She exhibits no distension and no mass. There is no tenderness. There is no rebound and no guarding.  Musculoskeletal: Normal range of motion. She exhibits no edema and no tenderness.  Lymphadenopathy:    She has no cervical adenopathy.  Neurological: She is alert and oriented to person, place, and time. She has normal reflexes. No cranial nerve deficit. She exhibits normal muscle tone. Coordination normal.  Skin: Skin is warm and dry. No rash noted. She is not diaphoretic. No erythema. No pallor.  Psychiatric: She has a normal mood and affect. Her behavior is normal. Judgment and thought content normal.          Assessment & Plan:

## 2012-09-30 NOTE — Patient Instructions (Addendum)
#  Pulmonary nodules and lymph gland  - THere is one small pulmonary nodule and small lymph gland enlargement  - this is likely from sarcoid. Unlikely this is from cancer. Too small to biopsy. Best to watch with scan in year   - these are not the cause of your symptoms and too small to bother you right now - there is no indication to treat for sarcoidosis of lung  #Shortness of breath and chest pains  - unclear cause. Walk test normal for oxygen level. - Do PFT breathing test; will call with results  #Fast heart rate  - re-refer Dr Garnette Scheuermann of cardiology to rule out cardiac sarcoid  #Followup  dependin on PFT results

## 2012-09-30 NOTE — Assessment & Plan Note (Signed)
Per Dr Fatima Sanger

## 2012-09-30 NOTE — Assessment & Plan Note (Signed)
Dr Katrinka Blazing unaware of sarcoid diagnosis. She needs re-eval for this because she stil has these symptoms. If cardiac sarcoid present, needs defibrillatior. Ordered a re-referral with DR Katrinka Blazing

## 2012-10-18 ENCOUNTER — Ambulatory Visit (INDEPENDENT_AMBULATORY_CARE_PROVIDER_SITE_OTHER): Payer: 59 | Admitting: Internal Medicine

## 2012-10-18 DIAGNOSIS — D869 Sarcoidosis, unspecified: Secondary | ICD-10-CM

## 2012-10-18 DIAGNOSIS — R918 Other nonspecific abnormal finding of lung field: Secondary | ICD-10-CM

## 2012-10-18 LAB — PULMONARY FUNCTION TEST

## 2012-10-18 NOTE — Progress Notes (Signed)
PFT done today. 

## 2012-10-26 ENCOUNTER — Other Ambulatory Visit (HOSPITAL_COMMUNITY): Payer: Self-pay | Admitting: Interventional Cardiology

## 2012-10-26 DIAGNOSIS — D869 Sarcoidosis, unspecified: Secondary | ICD-10-CM

## 2012-10-27 ENCOUNTER — Encounter: Payer: Self-pay | Admitting: Interventional Cardiology

## 2012-11-02 ENCOUNTER — Telehealth: Payer: Self-pay | Admitting: Internal Medicine

## 2012-11-02 NOTE — Telephone Encounter (Signed)
Let her know pft 10/18/12 is normal. Good news is that sarcoid lung invovlement is not there and not issue. Bad news: do not know why she is short of breath. IF further understanding of shortness of breath isneeded, she needs bike pulmonary stress test (CPST). IF she is willing, please set it up (not ordered). If she has more questions ontest, refer back to me this message and I will call her

## 2012-11-03 ENCOUNTER — Ambulatory Visit (HOSPITAL_COMMUNITY)
Admission: RE | Admit: 2012-11-03 | Discharge: 2012-11-03 | Disposition: A | Discharge status: Home / Self Care | Payer: 59 | Source: Ambulatory Visit | Attending: Interventional Cardiology | Admitting: Interventional Cardiology

## 2012-11-03 DIAGNOSIS — D869 Sarcoidosis, unspecified: Secondary | ICD-10-CM

## 2012-11-03 MED ORDER — GADOBENATE DIMEGLUMINE 529 MG/ML IV SOLN
20.0000 mL | Freq: Once | INTRAVENOUS | Status: AC
  Administered 2012-11-03: 20 mL via INTRAVENOUS

## 2012-11-04 NOTE — Telephone Encounter (Signed)
[email or web address].Masyn Rostro, CMA  

## 2012-11-18 ENCOUNTER — Encounter: Payer: Self-pay | Admitting: Internal Medicine

## 2012-11-24 ENCOUNTER — Telehealth: Payer: Self-pay | Admitting: Internal Medicine

## 2012-11-24 NOTE — Telephone Encounter (Signed)
LMTCBx2 on home and work number. Carron Curie, CMA

## 2012-11-24 NOTE — Telephone Encounter (Signed)
Duplicate. Jennifer Castillo, CMA  

## 2012-11-25 NOTE — Telephone Encounter (Signed)
Thanks & noted

## 2012-11-25 NOTE — Telephone Encounter (Signed)
Patient returning call.

## 2012-11-25 NOTE — Telephone Encounter (Signed)
I spoke with the pt and she states she would rather monitor her SOB and if it gets worse she will call to have cpst scheduled. She does not want to proceed with Cpst at this time.  Carron Curie, CMA

## 2012-11-30 ENCOUNTER — Telehealth: Payer: Self-pay | Admitting: Internal Medicine

## 2012-11-30 NOTE — Telephone Encounter (Signed)
LMTCBx1.Alexys Gassett, CMA  

## 2012-11-30 NOTE — Telephone Encounter (Signed)
Please let Renee Jones of May 19, 1951 know that cardiac MRI on 11/03/2012 was normal and did not show any evidence of sarcoidosis of the heart. She has to follow with Dr. Verdis Prime about this

## 2012-12-06 NOTE — Telephone Encounter (Signed)
LMTCBx2. Aaron Boeh, CMA  

## 2012-12-13 NOTE — Telephone Encounter (Signed)
Pt advised. Tekeyah Santiago, CMA  

## 2013-02-08 ENCOUNTER — Emergency Department (HOSPITAL_BASED_OUTPATIENT_CLINIC_OR_DEPARTMENT_OTHER)
Admission: EM | Admit: 2013-02-08 | Discharge: 2013-02-08 | Disposition: A | Discharge status: Home / Self Care | Payer: 59 | Attending: Emergency Medicine | Admitting: Emergency Medicine

## 2013-02-08 ENCOUNTER — Emergency Department (HOSPITAL_BASED_OUTPATIENT_CLINIC_OR_DEPARTMENT_OTHER): Payer: 59

## 2013-02-08 ENCOUNTER — Encounter (HOSPITAL_BASED_OUTPATIENT_CLINIC_OR_DEPARTMENT_OTHER): Payer: Self-pay

## 2013-02-08 DIAGNOSIS — R059 Cough, unspecified: Secondary | ICD-10-CM | POA: Insufficient documentation

## 2013-02-08 DIAGNOSIS — Z8739 Personal history of other diseases of the musculoskeletal system and connective tissue: Secondary | ICD-10-CM | POA: Insufficient documentation

## 2013-02-08 DIAGNOSIS — Z8679 Personal history of other diseases of the circulatory system: Secondary | ICD-10-CM | POA: Insufficient documentation

## 2013-02-08 DIAGNOSIS — E119 Type 2 diabetes mellitus without complications: Secondary | ICD-10-CM | POA: Insufficient documentation

## 2013-02-08 DIAGNOSIS — K219 Gastro-esophageal reflux disease without esophagitis: Secondary | ICD-10-CM | POA: Insufficient documentation

## 2013-02-08 DIAGNOSIS — J3489 Other specified disorders of nose and nasal sinuses: Secondary | ICD-10-CM | POA: Insufficient documentation

## 2013-02-08 DIAGNOSIS — Z79899 Other long term (current) drug therapy: Secondary | ICD-10-CM | POA: Insufficient documentation

## 2013-02-08 DIAGNOSIS — Z8619 Personal history of other infectious and parasitic diseases: Secondary | ICD-10-CM | POA: Insufficient documentation

## 2013-02-08 DIAGNOSIS — J069 Acute upper respiratory infection, unspecified: Secondary | ICD-10-CM | POA: Insufficient documentation

## 2013-02-08 DIAGNOSIS — R0982 Postnasal drip: Secondary | ICD-10-CM | POA: Insufficient documentation

## 2013-02-08 DIAGNOSIS — I10 Essential (primary) hypertension: Secondary | ICD-10-CM | POA: Insufficient documentation

## 2013-02-08 DIAGNOSIS — R05 Cough: Secondary | ICD-10-CM | POA: Insufficient documentation

## 2013-02-08 DIAGNOSIS — R0602 Shortness of breath: Secondary | ICD-10-CM | POA: Insufficient documentation

## 2013-02-08 LAB — RAPID STREP SCREEN (MED CTR MEBANE ONLY): Streptococcus, Group A Screen (Direct): NEGATIVE

## 2013-02-08 NOTE — ED Provider Notes (Signed)
History     CSN: [id number]  Arrival date & time 02/08/13  1146   First MD Initiated Contact with Patient 02/08/13 1319      Chief Complaint  Patient presents with  . Sore Throat  . Cough    (Consider location/radiation/quality/duration/timing/severity/associated sxs/prior treatment) Patient is a 62 y.o. female presenting with pharyngitis and cough.  Sore Throat  Cough  Pt with history of sarcoidosis reports 3-4 days of nasal congestion, post-nasal drip, sore throat and cough. Minimal SOB. No CP or fever. Some improvement with OTC meds, but still coughing. She takes weekly methotrexate, but not currently on steroids.   Past Medical History  Diagnosis Date  . Diabetes mellitus diet controlled per pt.  . Hypertension   . GERD (gastroesophageal reflux disease)   . Lumbar disc disease   . Tachycardia   . Sarcoidosis     Past Surgical History  Procedure Laterality Date  . Lumbar spine surgery    . Total abdominal hysterectomy    . Rotator cuff repair      Family History  Problem Relation Age of Onset  . Adopted: Yes  . Scleroderma Daughter     History  Substance Use Topics  . Smoking status: Never Smoker   . Smokeless tobacco: Not on file  . Alcohol Use: No    OB History   Grav Para Term Preterm Abortions TAB SAB Ect Mult Living                  Review of Systems  Respiratory: Positive for cough.    All other systems reviewed and are negative except as noted in HPI.   Allergies  Codeine; Erythromycin; and Sulfa antibiotics  Home Medications   Current Outpatient Rx  Name  Route  Sig  Dispense  Refill  . Cholecalciferol (VITAMIN D3) 50000 UNITS CAPS   Oral   Take 1 capsule by mouth once a week.         Marland Kitchen dexlansoprazole (DEXILANT) 60 MG capsule   Oral   Take 60 mg by mouth daily.         . folic acid (FOLVITE) 1 MG tablet   Oral   Take 1 mg by mouth daily.         . methotrexate (RHEUMATREX) 2.5 MG tablet   Oral   Take 10 mg by mouth  once a week. Caution:Chemotherapy. Protect from light.         . olmesartan (BENICAR) 20 MG tablet   Oral   Take 20 mg by mouth daily.         . traMADol (ULTRAM) 50 MG tablet   Oral   Take 50 mg by mouth every 6 (six) hours as needed. Back pain           BP 159/99  Pulse 102  Temp(Src) 98.1 F (36.7 C) (Oral)  Resp 18  Ht 5\' 4"  (1.626 m)  Wt 144 lb (65.318 kg)  BMI 24.71 kg/m2  SpO2 97%  Physical Exam  Nursing note and vitals reviewed. Constitutional: She is oriented to person, place, and time. She appears well-developed and well-nourished.  HENT:  Head: Normocephalic and atraumatic.  Mouth/Throat: No oropharyngeal exudate.  posterior pharynx is erythematous, surgically absent tonsils  Eyes: EOM are normal. Pupils are equal, round, and reactive to light.  Neck: Normal range of motion. Neck supple.  Cardiovascular: Normal rate, normal heart sounds and intact distal pulses.   Pulmonary/Chest: Effort normal and breath sounds normal.  Abdominal: Bowel sounds are normal. She exhibits no distension. There is no tenderness.  Musculoskeletal: Normal range of motion. She exhibits no edema and no tenderness.  Neurological: She is alert and oriented to person, place, and time. She has normal strength. No cranial nerve deficit or sensory deficit.  Skin: Skin is warm and dry. No rash noted.  Psychiatric: She has a normal mood and affect.    ED Course  Procedures (including critical care time)  Labs Reviewed  RAPID STREP SCREEN   Dg Chest 2 View  02/08/2013  *RADIOLOGY REPORT*  Clinical Data: Cough and congestion.  CHEST - 2 VIEW  Comparison: CT and plain films of the chest 04/13/2012.  Findings: Lungs are clear.  Heart size normal.  No pneumothorax or pleural effusion.  IMPRESSION: Negative exam.   Original Report Authenticated By: Holley Dexter, M.D.      1. URI, acute       MDM  CXR clear, no infection or significant hilar adenopathy. Will hold off on steroids  for now. Advised continued symptomatic care at home. Tussionex for cough at night. She has Rheum follow up next week.        Charles B. Bernette Mayers, MD 02/08/13 1436

## 2013-02-08 NOTE — ED Notes (Signed)
Patient transported to X-ray 

## 2013-02-08 NOTE — ED Notes (Signed)
Pt reports cough and sore throat since Monday

## 2013-03-28 ENCOUNTER — Telehealth: Payer: Self-pay | Admitting: Internal Medicine

## 2013-03-28 NOTE — Telephone Encounter (Signed)
I received the forms from the pt and advised taht MR is not in the office until 04-03-13 and he would be the one that needs to complete the form. I advised the pt that he may require a visit since last seen on 09-2012. Also the form states that the job description is attached and for the doctor to review this to make determination about work place, but this info is not attached so I advised the pt that we will need this and she states she will fax this to me so I provided her the triage fax. Will await fax. Carron Curie, CMA

## 2013-03-30 NOTE — Telephone Encounter (Signed)
No sign of fax in triage today Jenn, did you every receive this? Will be happy to call pt for you if not

## 2013-03-31 NOTE — Telephone Encounter (Signed)
Please advise, thank you.

## 2013-03-31 NOTE — Telephone Encounter (Signed)
LMTCBx1 because I have not received the job description fax from the pt. Carron Curie, CMA

## 2013-04-03 NOTE — Telephone Encounter (Signed)
LMOMTCB x 2   Will check with Victorino Dike to see if she has received this paperwork yet. Please advise. Thanks.

## 2013-04-04 NOTE — Telephone Encounter (Signed)
lmomtcb for pt to see if she can refax form -- as of Friday, Jenn hadn't received it.

## 2013-04-05 NOTE — Telephone Encounter (Signed)
ATC - Unable to leave message - Will try back later 

## 2013-04-07 NOTE — Telephone Encounter (Signed)
Pt returned call and can be reached @ [phone]. Renee Jones

## 2013-04-07 NOTE — Telephone Encounter (Signed)
LMTCB on pt home and work number. Pt will need an appt to discuss paperwork. I have not received the job description forms. Pt last seen 09-2012, so needs an appt to f/u and discuss. Carron Curie, CMA

## 2013-04-11 NOTE — Telephone Encounter (Signed)
Spoke with pt and she states that she has seen her primary  Care in the mean time and they filled out the paperwork that she needed.  No other forms are needed for MR.

## 2013-05-01 ENCOUNTER — Ambulatory Visit (INDEPENDENT_AMBULATORY_CARE_PROVIDER_SITE_OTHER)
Admission: RE | Admit: 2013-05-01 | Discharge: 2013-05-01 | Disposition: A | Discharge status: Home / Self Care | Payer: 59 | Source: Ambulatory Visit | Attending: Internal Medicine | Admitting: Internal Medicine

## 2013-05-01 DIAGNOSIS — D869 Sarcoidosis, unspecified: Secondary | ICD-10-CM

## 2013-05-01 DIAGNOSIS — R918 Other nonspecific abnormal finding of lung field: Secondary | ICD-10-CM

## 2013-05-03 ENCOUNTER — Telehealth: Payer: Self-pay | Admitting: Internal Medicine

## 2013-05-03 NOTE — Telephone Encounter (Signed)
Ct July 2014 shows stable 3mm nodule. No new findings. So no more CT needed eso because one from a year ago was the same.   She does not need fu unless still having dyspnea. At last phone conversation despite normal CT, norml PFT and normal cardiac mri she was having dyspnea but refused cpst.   Please check wit her  Dr. Kalman Shan, M.D., [facility].C.P Pulmonary and Critical Care Medicine Staff Physician [facility] Pulmonary and Critical Care Pager: [phone], If no answer or between  15:00h - 7:00h: call [phone]  05/03/2013 5:53 AM

## 2013-05-04 ENCOUNTER — Telehealth: Payer: Self-pay | Admitting: Internal Medicine

## 2013-05-04 NOTE — Telephone Encounter (Signed)
Spoke with pt and advised MR has not reviewed the ct chest yet, but will return to the office next wk and once he reviews we will give her a call. Pt verbalized understanding Please advise on ct results, thanks!

## 2013-05-04 NOTE — Telephone Encounter (Signed)
This is a duplicate message. MR reviewed results in phone note dated 05-03-13. Pt is aware. Carron Curie, CMA

## 2013-05-04 NOTE — Telephone Encounter (Signed)
Pt aware of results. Pt is asking for a letter stating she can resume her previous activities at work since she is doing well. 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 Pt would like to go back to going to clients homes and just needs a letter stating it is ok to do so. Please advise. Carron Curie, CMA  Pt wants letter faxed to [phone].

## 2013-05-05 NOTE — Telephone Encounter (Signed)
Ok to do such a letter to go back to work   Dr. Kalman Shan, M.D., [facility].C.P Pulmonary and Critical Care Medicine Staff Physician [facility] Pulmonary and Critical Care Pager: [phone], If no answer or between  15:00h - 7:00h: call [phone]  05/05/2013 4:56 AM

## 2013-05-10 ENCOUNTER — Encounter: Payer: Self-pay | Admitting: *Deleted

## 2013-05-10 NOTE — Telephone Encounter (Signed)
Letter completed and faxed to number given. Jarelyn Bambach, CMA  

## 2013-09-18 ENCOUNTER — Ambulatory Visit (INDEPENDENT_AMBULATORY_CARE_PROVIDER_SITE_OTHER): Payer: 59 | Admitting: Ophthalmology

## 2013-10-30 ENCOUNTER — Ambulatory Visit (INDEPENDENT_AMBULATORY_CARE_PROVIDER_SITE_OTHER): Payer: 59 | Admitting: Ophthalmology

## 2013-10-30 DIAGNOSIS — H209 Unspecified iridocyclitis: Secondary | ICD-10-CM

## 2013-10-30 DIAGNOSIS — H43819 Vitreous degeneration, unspecified eye: Secondary | ICD-10-CM

## 2014-03-15 ENCOUNTER — Other Ambulatory Visit: Payer: Self-pay | Admitting: Rheumatology

## 2014-03-15 ENCOUNTER — Ambulatory Visit
Admission: RE | Admit: 2014-03-15 | Discharge: 2014-03-15 | Disposition: A | Discharge status: Home / Self Care | Payer: 59 | Source: Ambulatory Visit | Attending: Rheumatology | Admitting: Rheumatology

## 2014-03-15 DIAGNOSIS — D869 Sarcoidosis, unspecified: Secondary | ICD-10-CM

## 2014-07-07 ENCOUNTER — Encounter (HOSPITAL_BASED_OUTPATIENT_CLINIC_OR_DEPARTMENT_OTHER): Payer: Self-pay | Admitting: Emergency Medicine

## 2014-07-07 ENCOUNTER — Emergency Department (HOSPITAL_BASED_OUTPATIENT_CLINIC_OR_DEPARTMENT_OTHER)
Admission: EM | Admit: 2014-07-07 | Discharge: 2014-07-07 | Disposition: A | Discharge status: Home / Self Care | Payer: 59 | Attending: Emergency Medicine | Admitting: Emergency Medicine

## 2014-07-07 DIAGNOSIS — E119 Type 2 diabetes mellitus without complications: Secondary | ICD-10-CM | POA: Insufficient documentation

## 2014-07-07 DIAGNOSIS — Z8619 Personal history of other infectious and parasitic diseases: Secondary | ICD-10-CM | POA: Diagnosis not present

## 2014-07-07 DIAGNOSIS — I10 Essential (primary) hypertension: Secondary | ICD-10-CM | POA: Insufficient documentation

## 2014-07-07 DIAGNOSIS — L03114 Cellulitis of left upper limb: Secondary | ICD-10-CM

## 2014-07-07 DIAGNOSIS — Z79899 Other long term (current) drug therapy: Secondary | ICD-10-CM | POA: Insufficient documentation

## 2014-07-07 DIAGNOSIS — Z8739 Personal history of other diseases of the musculoskeletal system and connective tissue: Secondary | ICD-10-CM | POA: Diagnosis not present

## 2014-07-07 DIAGNOSIS — IMO0002 Reserved for concepts with insufficient information to code with codable children: Secondary | ICD-10-CM | POA: Insufficient documentation

## 2014-07-07 DIAGNOSIS — K219 Gastro-esophageal reflux disease without esophagitis: Secondary | ICD-10-CM | POA: Insufficient documentation

## 2014-07-07 MED ORDER — CEPHALEXIN 250 MG PO CAPS
500.0000 mg | ORAL_CAPSULE | Freq: Once | ORAL | Status: AC
  Administered 2014-07-07: 500 mg via ORAL
  Filled 2014-07-07: qty 2

## 2014-07-07 MED ORDER — CEPHALEXIN 500 MG PO CAPS: 500.0000 mg | ORAL_CAPSULE | Freq: Four times a day (QID) | ORAL | Status: DC

## 2014-07-07 NOTE — ED Notes (Signed)
Pt reports rash to left lower arm with insect bite that she noticed yesterday.

## 2014-07-07 NOTE — ED Provider Notes (Signed)
Medical screening examination/treatment/procedure(s) were performed by non-physician practitioner and as supervising physician I was immediately available for consultation/collaboration.  Toy Cookey, MD 07/07/14 [phone]

## 2014-07-07 NOTE — ED Notes (Signed)
Induration of cellulitis marked

## 2014-07-07 NOTE — ED Provider Notes (Signed)
CSN: [id number]     Arrival date & time 07/07/14  1745 History   First MD Initiated Contact with Patient 07/07/14 1956     Chief Complaint  Patient presents with  . Cellulitis     (Consider location/radiation/quality/duration/timing/severity/associated sxs/prior Treatment) Patient is a 63 y.o. female presenting with rash. The history is provided by the patient. No language interpreter was used.  Rash Location:  Shoulder/arm Shoulder/arm rash location:  L arm Quality: painful and redness   Severity:  Moderate Associated symptoms: no fever, no myalgias, no nausea, no shortness of breath and not vomiting   Associated symptoms comment:  Painful area of redness and swelling to left proximal forearm since yesterday. No known injury. She was concerned she might have been bitten by something that is now infected. No fever, nausea, vomiting.    Past Medical History  Diagnosis Date  . Diabetes mellitus diet controlled per pt.  . Hypertension   . GERD (gastroesophageal reflux disease)   . Lumbar disc disease   . Tachycardia   . Sarcoidosis    Past Surgical History  Procedure Laterality Date  . Lumbar spine surgery    . Total abdominal hysterectomy    . Rotator cuff repair     Family History  Problem Relation Age of Onset  . Adopted: Yes  . Scleroderma Daughter    History  Substance Use Topics  . Smoking status: Never Smoker   . Smokeless tobacco: Not on file  . Alcohol Use: No   OB History   Grav Para Term Preterm Abortions TAB SAB Ect Mult Living                 Review of Systems  Constitutional: Negative for fever and chills.  HENT: Negative.  Negative for trouble swallowing.   Respiratory: Negative.  Negative for shortness of breath.   Cardiovascular: Negative.   Gastrointestinal: Negative.  Negative for nausea and vomiting.  Musculoskeletal: Negative.  Negative for myalgias.  Skin: Positive for rash.  Neurological: Negative.  Negative for weakness and numbness.       Allergies  Codeine; Erythromycin; and Sulfa antibiotics  Home Medications   Prior to Admission medications   Medication Sig Start Date End Date Taking? Authorizing Provider  folic acid (FOLVITE) 1 MG tablet Take 2 mg by mouth daily.    Yes Historical Provider, MD  methotrexate (RHEUMATREX) 2.5 MG tablet Take 25 mg by mouth once a week. Caution:Chemotherapy. Protect from light. 10/14/12  Yes Historical Provider, MD  olmesartan (BENICAR) 20 MG tablet Take 20 mg by mouth daily.   Yes Historical Provider, MD  Cholecalciferol (VITAMIN D3) 50000 UNITS CAPS Take 1 capsule by mouth once a week.    Historical Provider, MD  dexlansoprazole (DEXILANT) 60 MG capsule Take 60 mg by mouth daily.    Historical Provider, MD  traMADol (ULTRAM) 50 MG tablet Take 50 mg by mouth every 6 (six) hours as needed. Back pain    Historical Provider, MD   BP 137/78  Pulse 75  Temp(Src) 97.9 F (36.6 C) (Oral)  Resp 16  Ht 5\' 3"  (1.6 m)  Wt 144 lb (65.318 kg)  BMI 25.51 kg/m2  SpO2 99% Physical Exam  Constitutional: She appears well-developed and well-nourished. No distress.  Musculoskeletal:  FROM left UE. No joint swelling or pain with joint movement.   Skin:  Erythematous area proximal and medial left forearm. Minimally warm to touch. Tender in the central area only. No wound or drainage. No streaking redness  away from the forearm.     ED Course  Procedures (including critical care time) Labs Review Labs Reviewed - No data to display  Imaging Review No results found.   EKG Interpretation None      MDM   Final diagnoses:  None    1. Cellulitis  DDx: cellulitis vs insect bite with inflammatory response. She is currently taking methotrexate for sarcoid. Will cover with antibiotics and treat as early cellulitis with close PCP follow up in 2 days. Return precautions given.     Arnoldo Hooker, PA-C 07/07/14 2045

## 2014-07-07 NOTE — Discharge Instructions (Signed)

## 2014-11-16 ENCOUNTER — Ambulatory Visit
Admission: RE | Admit: 2014-11-16 | Discharge: 2014-11-16 | Disposition: A | Discharge status: Home / Self Care | Payer: 59 | Source: Ambulatory Visit | Attending: Family Medicine | Admitting: Family Medicine

## 2014-11-16 ENCOUNTER — Other Ambulatory Visit: Payer: Self-pay | Admitting: Family Medicine

## 2014-11-16 DIAGNOSIS — D869 Sarcoidosis, unspecified: Secondary | ICD-10-CM

## 2014-11-29 ENCOUNTER — Ambulatory Visit (INDEPENDENT_AMBULATORY_CARE_PROVIDER_SITE_OTHER): Payer: 59

## 2014-11-29 ENCOUNTER — Ambulatory Visit (INDEPENDENT_AMBULATORY_CARE_PROVIDER_SITE_OTHER): Payer: 59 | Admitting: Family Medicine

## 2014-11-29 VITALS — BP 175/82 | HR 80 | Temp 98.1°F | Resp 20 | Ht 63.0 in | Wt 143.4 lb

## 2014-11-29 DIAGNOSIS — S161XXA Strain of muscle, fascia and tendon at neck level, initial encounter: Secondary | ICD-10-CM

## 2014-11-29 DIAGNOSIS — S39012A Strain of muscle, fascia and tendon of lower back, initial encounter: Secondary | ICD-10-CM

## 2014-11-29 DIAGNOSIS — M542 Cervicalgia: Secondary | ICD-10-CM

## 2014-11-29 MED ORDER — CYCLOBENZAPRINE HCL 5 MG PO TABS: 5.0000 mg | ORAL_TABLET | Freq: Three times a day (TID) | ORAL | Status: DC | PRN
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
## 2014-12-20 ENCOUNTER — Encounter (INDEPENDENT_AMBULATORY_CARE_PROVIDER_SITE_OTHER): Payer: 59 | Admitting: Ophthalmology

## 2014-12-20 DIAGNOSIS — I10 Essential (primary) hypertension: Secondary | ICD-10-CM | POA: Diagnosis not present

## 2014-12-20 DIAGNOSIS — H35033 Hypertensive retinopathy, bilateral: Secondary | ICD-10-CM

## 2014-12-20 DIAGNOSIS — H3093 Unspecified chorioretinal inflammation, bilateral: Secondary | ICD-10-CM | POA: Diagnosis not present

## 2014-12-20 DIAGNOSIS — H2513 Age-related nuclear cataract, bilateral: Secondary | ICD-10-CM | POA: Diagnosis not present

## 2014-12-20 DIAGNOSIS — H43813 Vitreous degeneration, bilateral: Secondary | ICD-10-CM | POA: Diagnosis not present

## 2015-03-08 ENCOUNTER — Encounter (HOSPITAL_COMMUNITY): Payer: Self-pay | Admitting: *Deleted

## 2015-03-08 ENCOUNTER — Emergency Department (HOSPITAL_COMMUNITY)
Admission: EM | Admit: 2015-03-08 | Discharge: 2015-03-08 | Disposition: A | Discharge status: Home / Self Care | Payer: PRIVATE HEALTH INSURANCE | Attending: Emergency Medicine | Admitting: Emergency Medicine

## 2015-03-08 DIAGNOSIS — H538 Other visual disturbances: Secondary | ICD-10-CM | POA: Diagnosis not present

## 2015-03-08 DIAGNOSIS — I10 Essential (primary) hypertension: Secondary | ICD-10-CM | POA: Insufficient documentation

## 2015-03-08 DIAGNOSIS — I160 Hypertensive urgency: Secondary | ICD-10-CM

## 2015-03-08 DIAGNOSIS — Z791 Long term (current) use of non-steroidal anti-inflammatories (NSAID): Secondary | ICD-10-CM | POA: Diagnosis not present

## 2015-03-08 DIAGNOSIS — Z79899 Other long term (current) drug therapy: Secondary | ICD-10-CM | POA: Diagnosis not present

## 2015-03-08 DIAGNOSIS — Z862 Personal history of diseases of the blood and blood-forming organs and certain disorders involving the immune mechanism: Secondary | ICD-10-CM | POA: Diagnosis not present

## 2015-03-08 DIAGNOSIS — J45909 Unspecified asthma, uncomplicated: Secondary | ICD-10-CM | POA: Diagnosis not present

## 2015-03-08 DIAGNOSIS — K219 Gastro-esophageal reflux disease without esophagitis: Secondary | ICD-10-CM | POA: Insufficient documentation

## 2015-03-08 LAB — BASIC METABOLIC PANEL
Anion gap: 9 (ref 5–15)
BUN: 13 mg/dL (ref 6–20)
CALCIUM: 9.4 mg/dL (ref 8.9–10.3)
CO2: 26 mmol/L (ref 22–32)
Chloride: 106 mmol/L (ref 101–111)
Creatinine, Ser: 0.88 mg/dL (ref 0.44–1.00)
Glucose, Bld: 129 mg/dL — ABNORMAL HIGH (ref 65–99)
Potassium: 3.9 mmol/L (ref 3.5–5.1)
SODIUM: 141 mmol/L (ref 135–145)

## 2015-03-08 LAB — URINALYSIS, ROUTINE W REFLEX MICROSCOPIC
BILIRUBIN URINE: NEGATIVE
Glucose, UA: NEGATIVE mg/dL
HGB URINE DIPSTICK: NEGATIVE
KETONES UR: NEGATIVE mg/dL
Leukocytes, UA: NEGATIVE
Nitrite: NEGATIVE
PH: 5.5 (ref 5.0–8.0)
Protein, ur: NEGATIVE mg/dL
SPECIFIC GRAVITY, URINE: 1.012 (ref 1.005–1.030)
Urobilinogen, UA: 1 mg/dL (ref 0.0–1.0)

## 2015-03-08 LAB — CBC WITH DIFFERENTIAL/PLATELET
BASOS PCT: 0 % (ref 0–1)
Basophils Absolute: 0 10*3/uL (ref 0.0–0.1)
EOS PCT: 1 % (ref 0–5)
Eosinophils Absolute: 0.1 10*3/uL (ref 0.0–0.7)
HCT: 41.3 % (ref 36.0–46.0)
Hemoglobin: 14.1 g/dL (ref 12.0–15.0)
LYMPHS PCT: 22 % (ref 12–46)
Lymphs Abs: 1.5 10*3/uL (ref 0.7–4.0)
MCH: 29.5 pg (ref 26.0–34.0)
MCHC: 34.1 g/dL (ref 30.0–36.0)
MCV: 86.4 fL (ref 78.0–100.0)
MONO ABS: 0.3 10*3/uL (ref 0.1–1.0)
Monocytes Relative: 5 % (ref 3–12)
NEUTROS ABS: 4.7 10*3/uL (ref 1.7–7.7)
NEUTROS PCT: 72 % (ref 43–77)
Platelets: 285 10*3/uL (ref 150–400)
RBC: 4.78 MIL/uL (ref 3.87–5.11)
RDW: 13.6 % (ref 11.5–15.5)
WBC: 6.6 10*3/uL (ref 4.0–10.5)

## 2015-03-08 MED ORDER — HYDRALAZINE HCL 20 MG/ML IJ SOLN
10.0000 mg | INTRAMUSCULAR | Status: AC
  Administered 2015-03-08: 10 mg via INTRAVENOUS
  Filled 2015-03-08: qty 1

## 2015-03-08 NOTE — ED Notes (Signed)
Pt reports going to eye dr today and sent here due to bp 197/120. Reports hx of htn and taking meds as directed. Reports mild discomfort to back of head today but no neuro deficits noted at triage.

## 2015-03-08 NOTE — Discharge Instructions (Signed)
As discussed, your evaluation today has been largely reassuring.  But, it is important that you monitor your condition carefully, and do not hesitate to return to the ED if you develop new, or concerning changes in your condition.  Otherwise, please follow-up with your physician for appropriate ongoing care.  Hypertension Hypertension, commonly called high blood pressure, is when the force of blood pumping through your arteries is too strong. Your arteries are the blood vessels that carry blood from your heart throughout your body. A blood pressure reading consists of a higher number over a lower number, such as 110/72. The higher number (systolic) is the pressure inside your arteries when your heart pumps. The lower number (diastolic) is the pressure inside your arteries when your heart relaxes. Ideally you want your blood pressure below 120/80. Hypertension forces your heart to work harder to pump blood. Your arteries may become narrow or stiff. Having hypertension puts you at risk for heart disease, stroke, and other problems.  RISK FACTORS Some risk factors for high blood pressure are controllable. Others are not.  Risk factors you cannot control include:   Race. You may be at higher risk if you are African American.  Age. Risk increases with age.  Gender. Men are at higher risk than women before age 73 years. After age 43, women are at higher risk than men. Risk factors you can control include:  Not getting enough exercise or physical activity.  Being overweight.  Getting too much fat, sugar, calories, or salt in your diet.  Drinking too much alcohol. SIGNS AND SYMPTOMS Hypertension does not usually cause signs or symptoms. Extremely high blood pressure (hypertensive crisis) may cause headache, anxiety, shortness of breath, and nosebleed. DIAGNOSIS  To check if you have hypertension, your health care provider will measure your blood pressure while you are seated, with your arm held  at the level of your heart. It should be measured at least twice using the same arm. Certain conditions can cause a difference in blood pressure between your right and left arms. A blood pressure reading that is higher than normal on one occasion does not mean that you need treatment. If one blood pressure reading is high, ask your health care provider about having it checked again. TREATMENT  Treating high blood pressure includes making lifestyle changes and possibly taking medicine. Living a healthy lifestyle can help lower high blood pressure. You may need to change some of your habits. Lifestyle changes may include:  Following the DASH diet. This diet is high in fruits, vegetables, and whole grains. It is low in salt, red meat, and added sugars.  Getting at least 2 hours of brisk physical activity every week.  Losing weight if necessary.  Not smoking.  Limiting alcoholic beverages.  Learning ways to reduce stress. If lifestyle changes are not enough to get your blood pressure under control, your health care provider may prescribe medicine. You may need to take more than one. Work closely with your health care provider to understand the risks and benefits. HOME CARE INSTRUCTIONS  Have your blood pressure rechecked as directed by your health care provider.   Take medicines only as directed by your health care provider. Follow the directions carefully. Blood pressure medicines must be taken as prescribed. The medicine does not work as well when you skip doses. Skipping doses also puts you at risk for problems.   Do not smoke.   Monitor your blood pressure at home as directed by your health care provider.  SEEK MEDICAL CARE IF:   You think you are having a reaction to medicines taken.  You have recurrent headaches or feel dizzy.  You have swelling in your ankles.  You have trouble with your vision. SEEK IMMEDIATE MEDICAL CARE IF:  You develop a severe headache or  confusion.  You have unusual weakness, numbness, or feel faint.  You have severe chest or abdominal pain.  You vomit repeatedly.  You have trouble breathing. MAKE SURE YOU:   Understand these instructions.  Will watch your condition.  Will get help right away if you are not doing well or get worse. Document Released: 10/05/2005 Document Revised: 02/19/2014 Document Reviewed: 07/28/2013 [facility] Patient Information 2015 [place], [place]. This information is not intended to replace advice given to you by your health care provider. Make sure you discuss any questions you have with your health care provider.

## 2015-03-08 NOTE — ED Provider Notes (Signed)
CSN: [id number]     Arrival date & time 03/08/15  1451 History   First MD Initiated Contact with Patient 03/08/15 1656     Chief Complaint  Patient presents with  . Hypertension     (Consider location/radiation/quality/duration/timing/severity/associated sxs/prior Treatment) HPI Patient presents with concern of hypertension. Patient has a history of hypertension, and addition to sarcoidosis. Patient today the patient was evaluated by to the physicians, primarily for progression of her sarcoidosis, including progression to the eyes bilaterally. The second physician, her ophthalmologist referred her here which was found to be hypertensive. Currently she denies chest pain, dyspnea, belly pain, nausea, vomiting, weakness. She does acknowledge mild discomfort about both eyes, with intermittent blurriness of vision. She has been diagnosed with progression of sarcoid into her eyes, with increasing fluid retention bilaterally. No new ophthalmologic medication today. No other new medication recently. She takes all medication as directed.  Past Medical History  Diagnosis Date  . Hypertension   . GERD (gastroesophageal reflux disease)   . Lumbar disc disease   . Tachycardia   . Sarcoidosis   . Allergy   . Asthma   . Neuromuscular disorder    Past Surgical History  Procedure Laterality Date  . Lumbar spine surgery    . Total abdominal hysterectomy    . Rotator cuff repair     Family History  Problem Relation Age of Onset  . Adopted: Yes  . Scleroderma Daughter    History  Substance Use Topics  . Smoking status: Never Smoker   . Smokeless tobacco: Never Used  . Alcohol Use: No   OB History    No data available     Review of Systems  Constitutional:       Per HPI, otherwise negative  HENT:       Per HPI, otherwise negative  Eyes: Positive for visual disturbance.  Respiratory:       Per HPI, otherwise negative  Cardiovascular:       Per HPI, otherwise negative   Gastrointestinal: Negative for vomiting.  Endocrine:       Negative aside from HPI  Genitourinary:       Neg aside from HPI   Musculoskeletal:       Per HPI, otherwise negative  Skin: Negative.   Neurological: Negative for syncope.      Allergies  Codeine; Erythromycin; Sulfa antibiotics; and Tramadol  Home Medications   Prior to Admission medications   Medication Sig Start Date End Date Taking? Authorizing Provider  albuterol (PROVENTIL HFA;VENTOLIN HFA) 108 (90 BASE) MCG/ACT inhaler Inhale 2 puffs into the lungs every 6 (six) hours as needed for wheezing or shortness of breath.   Yes Historical Provider, MD  cyclobenzaprine (FLEXERIL) 5 MG tablet Take 1 tablet (5 mg total) by mouth 3 (three) times daily as needed for muscle spasms. 11/29/14  Yes Ethelda Chick, MD  folic acid (FOLVITE) 1 MG tablet Take 2 mg by mouth daily.    Yes Historical Provider, MD  losartan (COZAAR) 50 MG tablet Take 50 mg by mouth daily. 02/14/15  Yes Historical Provider, MD  methotrexate (RHEUMATREX) 2.5 MG tablet Take 25 mg by mouth once a week. Caution:Chemotherapy. Protect from light. (Friday) 10/14/12  Yes Historical Provider, MD  naproxen sodium (ANAPROX) 220 MG tablet Take 440 mg by mouth daily as needed (pain).   Yes Historical Provider, MD  pantoprazole (PROTONIX) 40 MG tablet Take 40 mg by mouth daily. 02/14/15  Yes Historical Provider, MD  celecoxib (CELEBREX) 200  MG capsule Take 200 mg by mouth daily. 03/08/15   Historical Provider, MD   BP 147/66 mmHg  Pulse 106  Temp(Src) 97.6 F (36.4 C) (Oral)  Resp 22  Ht 5\' 3"  (1.6 m)  Wt 140 lb (63.504 kg)  BMI 24.81 kg/m2  SpO2 99% Physical Exam  Constitutional: She is oriented to person, place, and time. She appears well-developed and well-nourished. No distress.  HENT:  Head: Normocephalic and atraumatic.  Eyes: Conjunctivae and EOM are normal.  Cardiovascular: Normal rate and regular rhythm.   Pulmonary/Chest: Effort normal and breath sounds  normal. No stridor. No respiratory distress.  Abdominal: She exhibits no distension.  Musculoskeletal: She exhibits no edema.  Neurological: She is alert and oriented to person, place, and time. No cranial nerve deficit.  Skin: Skin is warm and dry.  Psychiatric: She has a normal mood and affect.  Nursing note and vitals reviewed.   ED Course  Procedures (including critical care time) Labs Review Labs Reviewed  BASIC METABOLIC PANEL - Abnormal; Notable for the following:    Glucose, Bld 129 (*)    All other components within normal limits  CBC WITH DIFFERENTIAL/PLATELET  URINALYSIS, ROUTINE W REFLEX MICROSCOPIC    Imaging Review No results found.   EKG Interpretation   Date/Time:  Friday Mar 08 2015 17:09:41 EDT Ventricular Rate:  92 PR Interval:  160 QRS Duration: 68 QT Interval:  380 QTC Calculation: 470 R Axis:   63 Text Interpretation:  Sinus rhythm Minimal ST elevation, inferior leads  Sinus rhythm ST-t wave abnormality Artifact Abnormal ekg Confirmed by  11-19-1988  MD ([id number]) on 03/08/2015 6:34:33 PM     Pulse ox 100% room air normal Cardiac 75 sinus rhythm normal On repeat exam the patient appears generally well.   Update: On repeat exam the patient appears better, blood pressure is diminished 25%. We had a lengthy conversation about the need to follow-up with rheumatology, and ophthalmology teams, consideration of additional antihypertensives, as needed after the sarcoid issue has improved. MDM   Final diagnoses:  Hypertensive urgency   Patient presents with ongoing concerns of progression of her sarcoidosis, as well as new concerns of hypertension. Here, no evidence for end organ effects of hypertension, and the patient improved symptomatically in the emergency department. Given his reassuring findings, and low suspicion for occult new cause for blood pressure beyond likely contribution from her circumflex would flare, the patient was discharged in  stable condition.  03/10/2015, MD 03/08/15 [phone]

## 2015-04-23 ENCOUNTER — Ambulatory Visit (INDEPENDENT_AMBULATORY_CARE_PROVIDER_SITE_OTHER): Payer: 59 | Admitting: Internal Medicine

## 2015-04-23 ENCOUNTER — Encounter: Payer: Self-pay | Admitting: Internal Medicine

## 2015-04-23 VITALS — BP 138/76 | HR 98 | Ht 63.0 in | Wt 143.0 lb

## 2015-04-23 DIAGNOSIS — R0689 Other abnormalities of breathing: Secondary | ICD-10-CM | POA: Diagnosis not present

## 2015-04-23 DIAGNOSIS — R0600 Dyspnea, unspecified: Secondary | ICD-10-CM | POA: Diagnosis not present

## 2015-04-23 NOTE — Progress Notes (Signed)
Subjective:    Patient ID: Renee Jones, female    DOB: [birth year], 64 y.o.   MRN: [id number] PCP Beverley Fiedler, MD  HPI  PCP Beverley Fiedler, MD Eye - Dr Jerolyn Center Rheum - Dr Pollyann Savoy ENT -Dr Jearld Fenton Cardiologist - Dr Mendel Ryder  Body mass index is 25.18 kg/(m^2).  reports that she has never smoked. She does not have any smokeless tobacco history on file.    IOV 09/30/2012 60 year old. 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   Recently diagnosed sarcoidosis And Vit D Def-   June 2013 - chest pain. [facility] Ct angio negative but showed small nodules Aug 2013 - Noted to have neck nodes but also eye issues. Uveitis diagnosed. Reportedly stage 3. Also, follows at [facility]. 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 Lab tests: Autoimmune negative per her hx but ACE level high. Plan to start Mtx without prednisone per hx and review of outside chart  Now - referred here.   Reports fast heart rate x 2 years. On and off. Will happen suddenly. She can feel it. Started seeing Dr Katrinka Blazing x 2 years. Gives hx of holter and stress test a year ago was normal. Of note Dr Katrinka Blazing unaware of sarcoid diagnosis  Reports chest pains x July 2013. Went to ER and PE ruled out. Location is behind left breast and radiating to back. Stress test x July/Aug per hx was normal. Pain can happen anytime. There is associated chest tightness. Sometimes bad that has resulted in taking off from work. Feels like she has to take a deep breath. Improved by relaxing and resting. Brought on by being constantly fast paced.   Reports dyspnea x Aug 2013. Brought on by exertion like walking, climbing stairs or taking a shower or even talking too fast. Improved by keeping quiet and rest. Insidious onset. Constant. Stable since onset. There is  occ cough (mild and dry) No associated wheezing, hemoptysis, B symptoms, edema . Walking desaturation test on 09/30/2012 185 feet x 3 laps: did NOT desaturate. Rest pulse ox was 100%, final pulse ox was 100%. HR response 83/min at rest to 94/min at peak exertion. CT chest June 2013 - essentially clear lung fields except 1 small pulm nodule  OV 04/23/2015  Chief Complaint  Patient presents with  . Follow-up    Pt last seen by MR on 09/30/2012. Pt had PFT after OV and pt saw cardiology in 2013. Pt referred by Dr. Corliss Skains for sarcoid. Pt stated she is going to [facility] for Sarcoid of both eyes. Pt c/o DOE. Pt denies significant cough and CP.    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 At that time poor function tests, cardiac MRI, CT scan of the chest was essentially normal. Repeat CT scan of the chest July 2014 was also normal with stable micronodule. She was still having dyspnea but deferred further workup such as  pulmonary stress test. Now she returns 2 and half years later because the dyspnea persists. Reports dyspnea is mild. She notices dyspnea when she gets tachycardic. She gets tachycardic at rest randomly and these are episodic. Unsure if it happens at night but she denies paroxysmal nocturnal dyspnea or orthopnea. She also notices  tachycardia when she exerts herself and in association with this she gets dyspneic. Since December 2013 the course is progressive although overall severe disease still mild. Most recent chest x-ray February 2016 better personally visualized is still clear. She denies any associated cough or wheezing or edema or hemoptysis  In the interim she says her ocular sarcoid is significantly worse. She is on methotrexate injections for ocular sarcoid but these administered by our local rheumatologist. She is also requiring extensive eyedrops. She says the ocular sarcoidosis worsening was  diagnosed 2 years ago after she saw local retinal specialist. She now follows at [facility] Dr Dorette Grate  She has a pending cardiac reevaluation with Dr. Garnette Scheuermann pending. Suspect she will have echocardiogram and Holter monitors  Lab work 03/08/15   - hgb normal 14gm% and creat normal 0.88mg %   Current outpatient prescriptions:  .  albuterol (PROVENTIL HFA;VENTOLIN HFA) 108 (90 BASE) MCG/ACT inhaler, Inhale 2 puffs into the lungs every 6 (six) hours as needed for wheezing or shortness of breath., Disp: , Rfl:  .  celecoxib (CELEBREX) 200 MG capsule, Take 200 mg by mouth daily., Disp: , Rfl:  .  cyclobenzaprine (FLEXERIL) 5 MG tablet, Take 1 tablet (5 mg total) by mouth 3 (three) times daily as needed for muscle spasms., Disp: 40 tablet, Rfl: 0 .  folic acid (FOLVITE) 1 MG tablet, Take 2 mg by mouth daily. , Disp: , Rfl:  .  losartan (COZAAR) 50 MG tablet, Take 50 mg by mouth daily., Disp: , Rfl: 1 .  methotrexate (50 MG/ML) 1 G injection, Inject into the vein once a week., Disp: , Rfl:  .  pantoprazole (PROTONIX) 40 MG tablet, Take 40 mg by mouth daily., Disp: , Rfl: 5 .  prednisoLONE acetate (PRED FORTE) 1 % ophthalmic suspension, 1 drop 4 (four) times daily., Disp: , Rfl:    Review of Systems  Constitutional: Negative for fever and unexpected weight change.  HENT: Negative for congestion, dental problem, ear pain, nosebleeds, postnasal drip, rhinorrhea, sinus pressure, sneezing, sore throat and trouble swallowing.   Eyes: Negative for redness and itching.  Respiratory: Positive for shortness of breath. Negative for cough, chest tightness and wheezing.   Cardiovascular: Negative for palpitations and leg swelling.  Gastrointestinal: Negative for nausea and vomiting.  Genitourinary: Negative for dysuria.  Musculoskeletal: Negative for joint swelling.  Skin: Negative for rash.  Neurological: Negative for headaches.  Hematological: Does not bruise/bleed easily.  Psychiatric/Behavioral:  Negative for dysphoric mood. The patient is not nervous/anxious.        Objective:   Physical Exam  Constitutional: She is oriented to person, place, and time. She appears well-developed and well-nourished. No distress.  HENT:  Head: Normocephalic and atraumatic.  Right Ear: External ear normal.  Left Ear: External ear normal.  Mouth/Throat: Oropharynx is clear and moist. No oropharyngeal exudate.  Eyes: Conjunctivae and EOM are normal. Pupils are equal, round, and reactive to light. Right eye exhibits no discharge. Left eye exhibits no discharge. No scleral icterus.  Neck: Normal range of motion. Neck supple. No JVD present. No tracheal deviation present. No thyromegaly present.  Cardiovascular: Normal rate, regular rhythm, normal heart sounds and intact distal pulses.  Exam reveals no gallop and no friction rub.   No murmur heard. Pulmonary/Chest: Effort normal and breath sounds normal. No respiratory distress. She has no wheezes. She has no rales. She exhibits no tenderness.  Abdominal: Soft. Bowel sounds are normal. She exhibits no distension and no mass. There is  no tenderness. There is no rebound and no guarding.  Musculoskeletal: Normal range of motion. She exhibits no edema or tenderness.  Lymphadenopathy:    She has no cervical adenopathy.  Neurological: She is alert and oriented to person, place, and time. She has normal reflexes. No cranial nerve deficit. She exhibits normal muscle tone. Coordination normal.  Skin: Skin is warm and dry. No rash noted. She is not diaphoretic. No erythema. No pallor.  Psychiatric: She has a normal mood and affect. Her behavior is normal. Judgment and thought content normal.  Vitals reviewed.   Filed Vitals:   04/23/15 1356  BP: 138/76  Pulse: 98  Height: 5\' 3"  (1.6 m)  Weight: 143 lb (64.864 kg)  SpO2: 97%         Assessment & Plan:     ICD-9-CM ICD-10-CM   1. Dyspnea and respiratory abnormality 786.09 R06.00 Cardiopulmonary  exercise test    R06.89    - worsened clinically  PLAN Do CPST test with EIB challenge by 06-02-1969 at [facility] Await cardiac workup with Dr. [facility] - will need echocardiogram for pulmonary hypertension and Holter monitor  Followup  - after above with Dr Garnette Scheuermann   Future Appointments Date Time Provider Department Center  04/30/2015 3:00 PM [facility] LAB [facility]     Dr. 07/01/2015, M.D., [facility].C.P Pulmonary and Critical Care Medicine Staff Physician [facility] [place] [facility] Pager: [phone], If no answer or between  15:00h - 7:00h: call [phone]  04/23/2015 2:24 PM

## 2015-04-23 NOTE — Patient Instructions (Addendum)
ICD-9-CM ICD-10-CM   1. Dyspnea and respiratory abnormality 786.09 R06.00     R06.89     Do CPST test with EIB challenge by Lesia Hausen at [facility] Await cardiac workup with Dr. Garnette Scheuermann - will need echocardiogram for pulmonary hypertension and Holter monitor  Followup  - after above with Dr Emanuelle Gearing

## 2015-04-30 ENCOUNTER — Ambulatory Visit (HOSPITAL_COMMUNITY): Payer: 59 | Attending: Internal Medicine

## 2015-04-30 DIAGNOSIS — R0600 Dyspnea, unspecified: Secondary | ICD-10-CM | POA: Diagnosis not present

## 2015-04-30 DIAGNOSIS — R0689 Other abnormalities of breathing: Secondary | ICD-10-CM

## 2015-05-12 ENCOUNTER — Telehealth: Payer: Self-pay | Admitting: Internal Medicine

## 2015-05-12 DIAGNOSIS — R0600 Dyspnea, unspecified: Secondary | ICD-10-CM | POA: Diagnosis not present

## 2015-05-12 NOTE — Telephone Encounter (Signed)
Giver her first avail to discuss cpst report

## 2015-05-13 NOTE — Telephone Encounter (Signed)
lmtcb for pt.  

## 2015-05-14 NOTE — Telephone Encounter (Signed)
Spoke with the pt and scheduled first available with MR 07/10/15

## 2015-05-17 ENCOUNTER — Telehealth: Payer: Self-pay | Admitting: Cardiovascular Disease

## 2015-05-17 NOTE — Telephone Encounter (Signed)
Received records from [facility] for appointment on 05/21/15 with Dr Duke Salvia.  Records given to [facility] (medical records) for Dr Leonides Sake schedule on 05/21/15. lp

## 2015-05-17 NOTE — Telephone Encounter (Signed)
Received records

## 2015-05-20 NOTE — Progress Notes (Signed)
Cardiology Office Note   Date:  05/21/2015   ID:  Renee Jones, DOB 1951-03-12, MRN [id number]  PCP:  Renee Fiedler, MD  Cardiologist:   Renee Hook, MD   Chief Complaint  Patient presents with  . New Evaluation    Renee Jones referral - tachycardia and elevated BP; vibration sensation in chest since end of May; complains of a heaviness in chest/fullness and has to take a deep breath; complains of lightheadedness/dizziness sometimes noted with fast heart rate      History of Present Illness: Renee Jones is a 64 y.o. female paroxysmal SVT, HTN and sarcoid who presents for evaluation of palpitations.  Renee Jones reports episodes of rapid heart rate that occur out of nowhere.  Episodes occur 5-6 times daily and last for about 5 minutes at a time.  She reports feeling lightheaded and SOB, but denies CP or syncope.  At times her chest feels tight when it occurs.  She checks her HR at home and the highest noted HR is 122.  She feels tired after the episode and can hear the pounding in her ear.  She especially notes it at night when laying in bed  Renee Jones reports having this episodes for as long as she can remember.  She previously took a beta blocker (?corguard), which helped her symptoms.  However she decided to take it over 20 years ago b/c she didn't think she needed it anymore.  Since then the symptoms have recurred and are becoming more frequent and noticeable.  Renee Jones was previously a patient of Dr. Michaelle Jones.  She remembers him telling her to use Valsalva maneuvers when these episodes occur.  Renee Jones was evaluated in the ED 5/20 with elevated BP to 197/120.  She received IV medication with improvement.  She followed up with her PCP, Dr. Barbaraann Jones, who switched Olmesartan 20mg   to Losartan 50mg .  Since then her BP has been better controlled.  Ms. had a cardiac MRI 10/2012 that did not reveal any delayed enhancement concerning for cardiac involvement of her  sarcoid.   Past Medical History  Diagnosis Date  . Hypertension   . GERD (gastroesophageal reflux disease)   . Lumbar disc disease   . Tachycardia   . Sarcoidosis   . Allergy   . Asthma   . Neuromuscular disorder     Past Surgical History  Procedure Laterality Date  . Lumbar spine surgery    . Total abdominal hysterectomy    . Rotator cuff repair       Current Outpatient Prescriptions  Medication Sig Dispense Refill  . albuterol (PROVENTIL HFA;VENTOLIN HFA) 108 (90 BASE) MCG/ACT inhaler Inhale 2 puffs into the lungs every 6 (six) hours as needed for wheezing or shortness of breath.    . benzonatate (TESSALON) 100 MG capsule Take by mouth 3 (three) times daily as needed for cough.    . celecoxib (CELEBREX) 200 MG capsule Take 200 mg by mouth daily.    . cyclobenzaprine (FLEXERIL) 5 MG tablet Take 1 tablet (5 mg total) by mouth 3 (three) times daily as needed for muscle spasms. 40 tablet 0  . fluticasone (FLONASE) 50 MCG/ACT nasal spray Place 2 sprays into both nostrils daily as needed for allergies or rhinitis.    . folic acid (FOLVITE) 1 MG tablet Take 2 mg by mouth daily.     Renee Jones losartan (COZAAR) 50 MG tablet Take 50 mg by mouth daily.  1  . methotrexate (50  MG/ML) 1 G injection Inject into the vein once a week.    . pantoprazole (PROTONIX) 40 MG tablet Take 40 mg by mouth daily.  5  . Polyethyl Glycol-Propyl Glycol (SYSTANE OP) Apply to eye daily.    . prednisoLONE acetate (PRED FORTE) 1 % ophthalmic suspension 1 drop 4 (four) times daily.    . tapentadol (NUCYNTA) 50 MG TABS tablet Take 50 mg by mouth 3 (three) times daily as needed.    . metoprolol tartrate (LOPRESSOR) 25 MG tablet Take 0.5 tablets (12.5 mg total) by mouth 2 (two) times daily. 30 tablet 11   No current facility-administered medications for this visit.    Allergies:   Codeine; Erythromycin; Sulfa antibiotics; and Tramadol    Social History:  The patient  reports that she has never smoked. She has never  used smokeless tobacco. She reports that she does not drink alcohol or use illicit drugs.   Family History:  The patient's family history includes Scleroderma in her daughter. She was adopted.    ROS:  Please see the history of present illness.   Otherwise, review of systems are positive for none.   All other systems are reviewed and negative.    PHYSICAL EXAM: VS:  BP 132/76 mmHg  Pulse 88  Ht 5' 3.5" (1.613 m)  Wt 64.139 kg (141 lb 6.4 oz)  BMI 24.65 kg/m2 , BMI Body mass index is 24.65 kg/(m^2). GENERAL:  Well appearing HEENT:  Pupils equal round and reactive, fundi not visualized, oral mucosa unremarkable NECK:  No jugular venous distention, waveform within normal limits, carotid upstroke brisk and symmetric, no bruits, no thyromegaly LYMPHATICS:  No cervical adenopathy LUNGS:  Clear to auscultation bilaterally CHEST:  Unremarkable HEART:  PMI not displaced or sustained,S1 and S2 within normal limits, no S3, no S4, no clicks, no rubs, no murmurs ABD:  Flat, positive bowel sounds normal in frequency in pitch, no bruits, no rebound, no guarding, no midline pulsatile mass, no hepatomegaly, no splenomegaly EXT:  2 plus pulses throughout, no edema, no cyanosis no clubbing SKIN:  No rashes no nodules NEURO:  Cranial nerves II through XII grossly intact, motor grossly intact throughout PSYCH:  Cognitively intact, oriented to person place and time    EKG:  EKG is ordered today. The ekg ordered today demonstrates sinus rhythm, 88 bpm.     Recent Labs: 03/08/2015: BUN 13; Creatinine, Ser 0.88; Hemoglobin 14.1; Platelets 285; Potassium 3.9; Sodium 141    Lipid Panel No results found for: CHOL, TRIG, HDL, CHOLHDL, VLDL, LDLCALC, LDLDIRECT    Wt Readings from Last 3 Encounters:  05/21/15 64.139 kg (141 lb 6.4 oz)  04/23/15 64.864 kg (143 lb)  03/08/15 63.504 kg (140 lb)      Other studies Reviewed: Additional studies/ records that were reviewed today include: PCP note  Review  of the above records demonstrates:  Please see elsewhere in the note.     ASSESSMENT AND PLAN:  # Palpitations/SVT: Renee Jones has a history of recurrent palpitations that are reportedly SVT, though I am unable to find documentation today.  She was previously seen by Dr. Katrinka Blazing at [facility], and these records have not been migrated.  We will obtain these records to better define her arrhythmia.  She reportedly was well-controlled with a  Beta blocker and is interested in trying this again. - prior cardiology records requested - Metoprolol tartrate 12.5mg  q12h - If the etiology/nature of her arrhythmia is not clear upon further record review, will  obtain 24h holter - Consider ablation if symptoms don't improve with beta blocker   Current medicines are reviewed at length with the patient today.  The patient does not have concerns regarding medicines.  The following changes have been made:  Start metoprolol 12.5mg  q12h  Labs/ tests ordered today include: none.  Will obtain 24h Holter if needed after outside record review. No orders of the defined types were placed in this encounter.     Disposition:   FU with Dr. Elmarie Shiley C. [place] in 3 months    Signed, Renee Hook, MD  05/21/2015 1:07 PM    [facility]

## 2015-05-21 ENCOUNTER — Encounter: Payer: Self-pay | Admitting: Cardiovascular Disease

## 2015-05-21 ENCOUNTER — Telehealth: Payer: Self-pay | Admitting: Cardiovascular Disease

## 2015-05-21 ENCOUNTER — Ambulatory Visit (INDEPENDENT_AMBULATORY_CARE_PROVIDER_SITE_OTHER): Payer: 59 | Admitting: Cardiovascular Disease

## 2015-05-21 VITALS — BP 132/76 | HR 88 | Ht 63.5 in | Wt 141.4 lb

## 2015-05-21 DIAGNOSIS — R002 Palpitations: Secondary | ICD-10-CM

## 2015-05-21 MED ORDER — METOPROLOL TARTRATE 25 MG PO TABS
12.5000 mg | ORAL_TABLET | Freq: Two times a day (BID) | ORAL | Status: DC
Start: 2015-05-21 — End: 2015-10-28

## 2015-05-21 NOTE — Telephone Encounter (Signed)
Faxed signed Release to [facility] for records requested by Dr Duke Salvia.  Faxed on 05/21/15. lp

## 2015-05-21 NOTE — Patient Instructions (Signed)
Dr Duke Salvia has recommended making the following medication changes: START Metoprolol Tartrate 25 mg - take 0.5 tablet (12.5 mg total) by mouth twice daily  Dr Duke Salvia recommends that you schedule a follow-up appointment in 3 months.

## 2015-05-23 ENCOUNTER — Telehealth: Payer: Self-pay | Admitting: Cardiovascular Disease

## 2015-05-23 NOTE — Telephone Encounter (Signed)
05/23/2015 Received medical records on patient requested from Dr. Duke Salvia from [facility] records put in box to be delivered to Dr. Duke Salvia.

## 2015-07-10 ENCOUNTER — Ambulatory Visit: Payer: 59 | Admitting: Internal Medicine

## 2015-07-19 ENCOUNTER — Ambulatory Visit (INDEPENDENT_AMBULATORY_CARE_PROVIDER_SITE_OTHER): Payer: 59 | Admitting: Internal Medicine

## 2015-07-19 ENCOUNTER — Other Ambulatory Visit (HOSPITAL_COMMUNITY): Payer: Self-pay | Admitting: *Deleted

## 2015-07-19 ENCOUNTER — Encounter: Payer: Self-pay | Admitting: Internal Medicine

## 2015-07-19 VITALS — BP 150/74 | HR 83 | Ht 63.5 in | Wt 143.0 lb

## 2015-07-19 DIAGNOSIS — R0689 Other abnormalities of breathing: Secondary | ICD-10-CM | POA: Diagnosis not present

## 2015-07-19 DIAGNOSIS — R0600 Dyspnea, unspecified: Secondary | ICD-10-CM | POA: Diagnosis not present

## 2015-07-19 NOTE — Patient Instructions (Signed)
ICD-9-CM ICD-10-CM   1. Dyspnea and respiratory abnormality 786.09 R06.00     R06.89    - shortness of breath cardiac/circulatory related - I am glad that lopressor is helping  PLAN = no active followup at [facility] - return as needed

## 2015-07-19 NOTE — Progress Notes (Signed)
Subjective:    Patient ID: Renee Jones, female    DOB: Jan 02, 1951, 64 y.o.   MRN: [id number]  HPI     OV 07/19/2015  Chief Complaint  Patient presents with  . Follow-up    Pt here after CPST. Pt states her breathing has improved since last OV. Pt states she was started on Metoprolol and now she does not feel as SOB. Pt denies CP/tightness and cough.     Follow-up dyspnea. She has known pulmonary but ocular sarcoid. Since last visit she tells me she is going to start TNF alpha blockade infusions. Her pulmonary stress test done in mid July 2016 shows a diminished work capacity to 70%. Normal anaerobic threshold. She reached peak heart rate without any heart rate reserve [off note tachycardia is a complaint follow-up]. The stroke volume was flat. This fits in with diastolic dysfunction as a common etiology. The conclusion is that she had circulatory limitation for dyspnea  In the interim review of the chart shows that she saw Dr. Duke Salvia cardiologist. Geronimo Running been started on low-dose Lopressor. She tells me this has helped her dyspnea immensely and she is back to feeling dyspnea free  No other complaints   Current outpatient prescriptions:  .  celecoxib (CELEBREX) 200 MG capsule, Take 200 mg by mouth daily., Disp: , Rfl:  .  fluticasone (FLONASE) 50 MCG/ACT nasal spray, Place 2 sprays into both nostrils daily as needed for allergies or rhinitis., Disp: , Rfl:  .  folic acid (FOLVITE) 1 MG tablet, Take 2 mg by mouth daily. , Disp: , Rfl:  .  losartan (COZAAR) 50 MG tablet, Take 50 mg by mouth daily., Disp: , Rfl: 1 .  methotrexate (50 MG/ML) 1 G injection, Inject into the vein once a week., Disp: , Rfl:  .  metoprolol tartrate (LOPRESSOR) 25 MG tablet, Take 0.5 tablets (12.5 mg total) by mouth 2 (two) times daily., Disp: 30 tablet, Rfl: 11 .  pantoprazole (PROTONIX) 40 MG tablet, Take 40 mg by mouth daily., Disp: , Rfl: 5 .  Polyethyl Glycol-Propyl Glycol (SYSTANE OP), Apply to eye  daily., Disp: , Rfl:  .  tapentadol (NUCYNTA) 50 MG TABS tablet, Take 50 mg by mouth 3 (three) times daily as needed., Disp: , Rfl:  .  albuterol (PROVENTIL HFA;VENTOLIN HFA) 108 (90 BASE) MCG/ACT inhaler, Inhale 2 puffs into the lungs every 6 (six) hours as needed for wheezing or shortness of breath., Disp: , Rfl:   Immunization History  Administered Date(s) Administered  . Influenza Split 07/19/2014  . Pneumococcal Polysaccharide-23 09/19/2012  . Pneumococcal-Unspecified 01/18/2015  . Varicella 09/19/2012       Review of Systems Discussion only visit    Objective:   Physical Exam  Filed Vitals:   07/19/15 1014  BP: 150/74  Pulse: 83  Height: 5' 3.5" (1.613 m)  Weight: 143 lb (64.864 kg)  SpO2: 98%   Discussion only visit      Assessment & Plan:     ICD-9-CM ICD-10-CM   1. Dyspnea and respiratory abnormality 786.09 R06.00     R06.89    - Cardiopulmonary stress test shows circulatory limitation for dyspnea. Coincidentally Lopressor has helped. She does not have any active pulmonary issues. We will discharge her from follow-up. I will send this note to her rheumatologist Dr D and cardiologist Dr. Duke Salvia Urgent scan  Time spent > 50% of 10 min visit spent on face to face counseling  Dr. Kalman Shan, M.D., [facility].C.P Pulmonary and Critical  Care Medicine Staff Physician [facility] [place] [facility] Pager: [phone], If no answer or between  15:00h - 7:00h: call [phone]  07/19/2015 10:29 AM

## 2015-07-22 ENCOUNTER — Encounter (HOSPITAL_COMMUNITY)
Admission: RE | Admit: 2015-07-22 | Discharge: 2015-07-22 | Disposition: A | Discharge status: Home / Self Care | Payer: 59 | Source: Ambulatory Visit | Attending: Rheumatology | Admitting: Rheumatology

## 2015-07-22 DIAGNOSIS — H20029 Recurrent acute iridocyclitis, unspecified eye: Secondary | ICD-10-CM | POA: Diagnosis not present

## 2015-07-22 LAB — COMPREHENSIVE METABOLIC PANEL
ALT: 16 U/L (ref 14–54)
AST: 24 U/L (ref 15–41)
Albumin: 4 g/dL (ref 3.5–5.0)
Alkaline Phosphatase: 59 U/L (ref 38–126)
Anion gap: 9 (ref 5–15)
BUN: 17 mg/dL (ref 6–20)
CHLORIDE: 105 mmol/L (ref 101–111)
CO2: 25 mmol/L (ref 22–32)
CREATININE: 0.98 mg/dL (ref 0.44–1.00)
Calcium: 9 mg/dL (ref 8.9–10.3)
GFR calc Af Amer: >60 mL/min (ref >60)
Glucose, Bld: 199 mg/dL — ABNORMAL HIGH (ref 65–99)
Potassium: 3.8 mmol/L (ref 3.5–5.1)
Sodium: 139 mmol/L (ref 135–145)
Total Bilirubin: 0.9 mg/dL (ref 0.3–1.2)
Total Protein: 6.6 g/dL (ref 6.5–8.1)

## 2015-07-22 LAB — CBC WITH DIFFERENTIAL/PLATELET
BASOS ABS: 0 10*3/uL (ref 0.0–0.1)
Basophils Relative: 0 %
EOS PCT: 2 %
Eosinophils Absolute: 0.1 10*3/uL (ref 0.0–0.7)
HEMATOCRIT: 39.5 % (ref 36.0–46.0)
HEMOGLOBIN: 13.3 g/dL (ref 12.0–15.0)
LYMPHS PCT: 27 %
Lymphs Abs: 1.6 10*3/uL (ref 0.7–4.0)
MCH: 29.8 pg (ref 26.0–34.0)
MCHC: 33.7 g/dL (ref 30.0–36.0)
MCV: 88.6 fL (ref 78.0–100.0)
Monocytes Absolute: 0.4 10*3/uL (ref 0.1–1.0)
Monocytes Relative: 6 %
NEUTROS ABS: 3.8 10*3/uL (ref 1.7–7.7)
NEUTROS PCT: 65 %
PLATELETS: 291 10*3/uL (ref 150–400)
RBC: 4.46 MIL/uL (ref 3.87–5.11)
RDW: 13.7 % (ref 11.5–15.5)
WBC: 5.9 10*3/uL (ref 4.0–10.5)

## 2015-07-22 MED ORDER — DIPHENHYDRAMINE HCL 25 MG PO CAPS
25.0000 mg | ORAL_CAPSULE | ORAL | Status: DC
  Administered 2015-07-22: 25 mg via ORAL

## 2015-07-22 MED ORDER — ACETAMINOPHEN 325 MG PO TABS
ORAL_TABLET | ORAL | Status: AC
  Filled 2015-07-22: qty 2

## 2015-07-22 MED ORDER — SODIUM CHLORIDE 0.9 % IV SOLN
INTRAVENOUS | Status: DC
  Administered 2015-07-22: 11:00:00 via INTRAVENOUS

## 2015-07-22 MED ORDER — DIPHENHYDRAMINE HCL 25 MG PO CAPS
ORAL_CAPSULE | ORAL | Status: AC
  Filled 2015-07-22: qty 1

## 2015-07-22 MED ORDER — INFLIXIMAB 100 MG IV SOLR
3.0000 mg/kg | INTRAVENOUS | Status: DC
  Administered 2015-07-22: 200 mg via INTRAVENOUS
  Filled 2015-07-22: qty 20

## 2015-07-22 MED ORDER — ACETAMINOPHEN 325 MG PO TABS
650.0000 mg | ORAL_TABLET | ORAL | Status: DC
  Administered 2015-07-22: 650 mg via ORAL

## 2015-08-05 ENCOUNTER — Ambulatory Visit (HOSPITAL_COMMUNITY)
Admission: RE | Admit: 2015-08-05 | Discharge: 2015-08-05 | Disposition: A | Discharge status: Home / Self Care | Payer: 59 | Source: Ambulatory Visit | Attending: Rheumatology | Admitting: Rheumatology

## 2015-08-05 DIAGNOSIS — H20029 Recurrent acute iridocyclitis, unspecified eye: Secondary | ICD-10-CM | POA: Insufficient documentation

## 2015-08-05 MED ORDER — DIPHENHYDRAMINE HCL 25 MG PO CAPS
ORAL_CAPSULE | ORAL | Status: AC
  Administered 2015-08-05: 25 mg
  Filled 2015-08-05: qty 1

## 2015-08-05 MED ORDER — ACETAMINOPHEN 325 MG PO TABS
ORAL_TABLET | ORAL | Status: AC
  Administered 2015-08-05: 650 mg
  Filled 2015-08-05: qty 2

## 2015-08-05 MED ORDER — SODIUM CHLORIDE 0.9 % IV SOLN
200.0000 mg | INTRAVENOUS | Status: DC
  Administered 2015-08-05: 200 mg via INTRAVENOUS
  Filled 2015-08-05: qty 20

## 2015-08-05 MED ORDER — ACETAMINOPHEN 325 MG PO TABS: 650.0000 mg | ORAL_TABLET | ORAL | Status: DC

## 2015-08-05 MED ORDER — SODIUM CHLORIDE 0.9 % IV SOLN: INTRAVENOUS | Status: DC

## 2015-08-05 MED ORDER — DIPHENHYDRAMINE HCL 25 MG PO CAPS: 25.0000 mg | ORAL_CAPSULE | ORAL | Status: DC

## 2015-08-27 NOTE — Progress Notes (Signed)
Cardiology Office Note   Date:  08/28/2015   ID:  Renee Jones, DOB 30-Jun-1951, MRN [id number]  PCP:  Beverley Fiedler, MD  Cardiologist:   Madilyn Hook, MD   Chief Complaint  Patient presents with  . Follow-up    pt c/o no lightheadedness or dizziness  . Chest Pain    pt states she has no pain in her chest but ometimes feels like a squeeze but it isn't painful  . Shortness of Breath    sometimes, not as much as usual  . Edema    no edema    Patient ID:   Renee Jones is a 64 y.o. female paroxysmal SVT, HTN and sarcoidosis.    Interval History 08/28/15: Upon review of Renee Jones's records from Dr. Katrinka Blazing, Renee Jones does have a documented history of SVT.  At her last appointment she was started on metoprolol. She thinks that this has improved her shortness of breath. She continues to have occasional episodes of SVT, though they last for just a few seconds at a time. She has on average one or 2 per month. In the past she has been nearly every day. When she has these episodes she feels a squeezing sensation in her chest and into her neck. This is associated with an inability to catch her breath until the episode subsides. She denies exertional chest pain or palpitations. She does continue to have some mild shortness of breath with exertion. She underwent cardiopulmonary exercise testing in July that was suggestive of diastolic dysfunction. She followed up with her pulmonologist, Dr.Ramaswamy, that her shortness of breath was unrelated to pulmonary sarcoidosis. He discharged her from pulmonary clinic.  Renee Jones walks for exercise daily for about 30 minutes each day.  Renee Jones rheumatologist checks her labs every 2 weeks. She's been getting Remicade infusions. At her last appointment she found out that her lipids and glucose was slightly elevated. She was instructed to work on her diet and is scheduled to have these repeated in one to 2 weeks.     History of  Present Illness: Renee Jones reports episodes of rapid heart rate that occur out of nowhere.  Episodes occur 5-6 times daily and last for about 5 minutes at a time.  She reports feeling lightheaded and SOB, but denies CP or syncope.  At times her chest feels tight when it occurs.  She checks her HR at home and the highest noted HR is 122.  She feels tired after the episode and can hear the pounding in her ear.  She especially notes it at night when laying in bed.  Renee Jones reports having this episodes for as long as she can remember.  She previously took a beta blocker (?corguard), which helped her symptoms.  However she decided to take it over 20 years ago b/c she didn't think she needed it anymore.  Since then the symptoms have recurred and are becoming more frequent and noticeable.  Renee Jones was previously a patient of Dr. Michaelle Copas.  She remembers him telling her to use Valsalva maneuvers when these episodes occur.  Renee Jones was evaluated in the ED 5/20 with elevated BP to 197/120.  She received IV medication with improvement.  She followed up with her PCP, Dr. Barbaraann Barthel, who switched Olmesartan 20mg   to Losartan 50mg .  Since then her BP has been better controlled.  Ms. had a cardiac MRI 10/2012 that did not reveal any delayed enhancement concerning for cardiac involvement  of her sarcoid.   Past Medical History  Diagnosis Date  . Hypertension   . GERD (gastroesophageal reflux disease)   . Lumbar disc disease   . Tachycardia   . Sarcoidosis (HCC)   . Allergy   . Asthma   . Neuromuscular disorder (HCC)   . SVT (supraventricular tachycardia) (HCC) 08/28/2015  . Essential hypertension 08/28/2015  . Diastolic dysfunction 08/28/2015    Past Surgical History  Procedure Laterality Date  . Lumbar spine surgery    . Total abdominal hysterectomy    . Rotator cuff repair       Current Outpatient Prescriptions  Medication Sig Dispense Refill  . albuterol (PROVENTIL HFA;VENTOLIN HFA) 108 (90  BASE) MCG/ACT inhaler Inhale 2 puffs into the lungs every 6 (six) hours as needed for wheezing or shortness of breath.    . celecoxib (CELEBREX) 200 MG capsule Take 200 mg by mouth daily.    . fluticasone (FLONASE) 50 MCG/ACT nasal spray Place 2 sprays into both nostrils daily as needed for allergies or rhinitis.    . folic acid (FOLVITE) 1 MG tablet Take 2 mg by mouth daily.     Marland Kitchen losartan (COZAAR) 50 MG tablet Take 50 mg by mouth daily.  1  . methotrexate (50 MG/ML) 1 G injection Inject into the vein once a week.    . metoprolol tartrate (LOPRESSOR) 25 MG tablet Take 0.5 tablets (12.5 mg total) by mouth 2 (two) times daily. 30 tablet 11  . ondansetron (ZOFRAN) 4 MG tablet Take 4 mg by mouth as needed.  1  . pantoprazole (PROTONIX) 40 MG tablet Take 40 mg by mouth daily.  5  . Polyethyl Glycol-Propyl Glycol (SYSTANE OP) Apply to eye daily.    . tapentadol (NUCYNTA) 50 MG TABS tablet Take 50 mg by mouth 3 (three) times daily as needed.     No current facility-administered medications for this visit.    Allergies:   Codeine; Erythromycin; Sulfa antibiotics; and Tramadol    Social History:  The patient  reports that she has never smoked. She has never used smokeless tobacco. She reports that she does not drink alcohol or use illicit drugs.   Family History:  The patient's family history includes Scleroderma in her daughter. She was adopted.    ROS:  Please see the history of present illness.   Otherwise, review of systems are positive for none.   All other systems are reviewed and negative.    PHYSICAL EXAM: VS:  BP 136/84 mmHg  Pulse 83  Ht 5' 3.5" (1.613 m)  Wt 64.003 kg (141 lb 1.6 oz)  BMI 24.60 kg/m2 , BMI Body mass index is 24.6 kg/(m^2). GENERAL:  Well appearing HEENT:  Pupils equal round and reactive, fundi not visualized, oral mucosa unremarkable NECK:  No jugular venous distention, waveform within normal limits, carotid upstroke brisk and symmetric, no bruits, no  thyromegaly LYMPHATICS:  No cervical adenopathy LUNGS:  Clear to auscultation bilaterally CHEST:  Unremarkable HEART:  PMI not displaced or sustained,S1 and S2 within normal limits, no S3, no S4, no clicks, no rubs, no murmurs ABD:  Flat, positive bowel sounds normal in frequency in pitch, no bruits, no rebound, no guarding, no midline pulsatile mass, no hepatomegaly, no splenomegaly EXT:  2 plus pulses throughout, no edema, no cyanosis no clubbing SKIN:  No rashes no nodules NEURO:  Cranial nerves II through XII grossly intact, motor grossly intact throughout PSYCH:  Cognitively intact, oriented to person place and time  EKG:  EKG is not ordered today.   Recent Labs: 07/22/2015: ALT 16; BUN 17; Creatinine, Ser 0.98; Hemoglobin 13.3; Platelets 291; Potassium 3.8; Sodium 139    Lipid Panel No results found for: CHOL, TRIG, HDL, CHOLHDL, VLDL, LDLCALC, LDLDIRECT    Wt Readings from Last 3 Encounters:  08/28/15 64.003 kg (141 lb 1.6 oz)  08/05/15 63.957 kg (141 lb)  07/22/15 63.957 kg (141 lb)      Other studies Reviewed: Additional studies/ records that were reviewed today include: PCP note  Review of the above records demonstrates:  Please see elsewhere in the note.     CPX 05/01/15: Conclusion: Exercise testing with gas-exchange demonstrates a mild functional impairment when compared to matched sedentary norms. There is no significant evidence of EIB. The combination of reduced PVO2, hypertensive BP, elevated VE/VCO2 slope, and flat O2 pulse suggests possible circulatory limitations (specifically diastolic dysfunction).   ASSESSMENT AND PLAN:  Chronic diastolic heart failure: CPX was suggestive of diastolic dysfunction and her symptoms have improved with metoprolol. We will echo diastolic function. She has grade 1-2 diastolic dysfunction we will likely increase her metoprolol to increase early diastolic filling time. At that time we will switch to metoprolol succinate,  likely 50 mg daily.  # SVT: Symptoms have improved and episodes occur, aspirin one since starting metoprolol. We will check an echo as above and likely increase the dose based on the degree of diastolic dysfunction is noted. Given that her symptoms are improved we will not refer her for consideration of catheter ablation at this time.    Current medicines are reviewed at length with the patient today.  The patient does not have concerns regarding medicines.  The following changes have been made:  Start metoprolol 12.5mg  q12h  Labs/ tests ordered today include: none.  Will obtain 24h Holter if needed after outside record review.  Orders Placed This Encounter  Procedures  . ECHOCARDIOGRAM COMPLETE     Disposition:   FU with Dr. Elmarie Shiley C. [place] in 6 months    Signed, Madilyn Hook, MD  08/28/2015 2:33 PM    [facility]

## 2015-08-28 ENCOUNTER — Encounter: Payer: Self-pay | Admitting: Cardiovascular Disease

## 2015-08-28 ENCOUNTER — Ambulatory Visit (INDEPENDENT_AMBULATORY_CARE_PROVIDER_SITE_OTHER): Payer: 59 | Admitting: Cardiovascular Disease

## 2015-08-28 VITALS — BP 136/84 | HR 83 | Ht 63.5 in | Wt 141.1 lb

## 2015-08-28 DIAGNOSIS — I519 Heart disease, unspecified: Secondary | ICD-10-CM

## 2015-08-28 DIAGNOSIS — R0609 Other forms of dyspnea: Secondary | ICD-10-CM

## 2015-08-28 DIAGNOSIS — I471 Supraventricular tachycardia, unspecified: Secondary | ICD-10-CM

## 2015-08-28 DIAGNOSIS — I10 Essential (primary) hypertension: Secondary | ICD-10-CM

## 2015-08-28 DIAGNOSIS — I5189 Other ill-defined heart diseases: Secondary | ICD-10-CM

## 2015-08-28 HISTORY — DX: Supraventricular tachycardia: I47.1

## 2015-08-28 HISTORY — DX: Supraventricular tachycardia, unspecified: I47.10

## 2015-08-28 HISTORY — DX: Essential (primary) hypertension: I10

## 2015-08-28 HISTORY — DX: Other ill-defined heart diseases: I51.89

## 2015-08-28 NOTE — Patient Instructions (Signed)
Your physician wants you to follow-up in 6 months with Dr  Duke Salvia.  You will receive a reminder letter in the mail two months in advance. If you don't receive a letter, please call our office to schedule the follow-up appointment.  Your physician has requested that you have an echocardiogram at [address]  -- [address]. Echocardiography is a painless test that uses sound waves to create images of your heart. It provides your doctor with information about the size and shape of your heart and how well your heart's chambers and valves are working. This procedure takes approximately one hour. There are no restrictions for this procedure.   Will contact you with results  If you need a refill on your cardiac medications before your next appointment, please call your pharmacy.

## 2015-09-02 ENCOUNTER — Ambulatory Visit (HOSPITAL_COMMUNITY)
Admission: RE | Admit: 2015-09-02 | Discharge: 2015-09-02 | Disposition: A | Discharge status: Home / Self Care | Payer: 59 | Source: Ambulatory Visit | Attending: Rheumatology | Admitting: Rheumatology

## 2015-09-02 DIAGNOSIS — H20029 Recurrent acute iridocyclitis, unspecified eye: Secondary | ICD-10-CM | POA: Diagnosis not present

## 2015-09-02 LAB — CBC WITH DIFFERENTIAL/PLATELET
BASOS ABS: 0 10*3/uL (ref 0.0–0.1)
BASOS PCT: 0 %
EOS PCT: 1 %
Eosinophils Absolute: 0.1 10*3/uL (ref 0.0–0.7)
HEMATOCRIT: 42 % (ref 36.0–46.0)
Hemoglobin: 14.5 g/dL (ref 12.0–15.0)
Lymphocytes Relative: 32 %
Lymphs Abs: 2.6 10*3/uL (ref 0.7–4.0)
MCH: 30.5 pg (ref 26.0–34.0)
MCHC: 34.5 g/dL (ref 30.0–36.0)
MCV: 88.4 fL (ref 78.0–100.0)
MONO ABS: 0.4 10*3/uL (ref 0.1–1.0)
MONOS PCT: 5 %
Neutro Abs: 5 10*3/uL (ref 1.7–7.7)
Neutrophils Relative %: 62 %
PLATELETS: 306 10*3/uL (ref 150–400)
RBC: 4.75 MIL/uL (ref 3.87–5.11)
RDW: 13.8 % (ref 11.5–15.5)
WBC: 8 10*3/uL (ref 4.0–10.5)

## 2015-09-02 LAB — COMPREHENSIVE METABOLIC PANEL
ALT: 21 U/L (ref 14–54)
ANION GAP: 8 (ref 5–15)
AST: 26 U/L (ref 15–41)
Albumin: 4.5 g/dL (ref 3.5–5.0)
Alkaline Phosphatase: 65 U/L (ref 38–126)
BILIRUBIN TOTAL: 0.9 mg/dL (ref 0.3–1.2)
BUN: 17 mg/dL (ref 6–20)
CHLORIDE: 108 mmol/L (ref 101–111)
CO2: 25 mmol/L (ref 22–32)
Calcium: 9.4 mg/dL (ref 8.9–10.3)
Creatinine, Ser: 0.9 mg/dL (ref 0.44–1.00)
GFR calc Af Amer: >60 mL/min (ref >60)
Glucose, Bld: 121 mg/dL — ABNORMAL HIGH (ref 65–99)
POTASSIUM: 3.9 mmol/L (ref 3.5–5.1)
Sodium: 141 mmol/L (ref 135–145)
TOTAL PROTEIN: 7.2 g/dL (ref 6.5–8.1)

## 2015-09-02 MED ORDER — ACETAMINOPHEN 325 MG PO TABS
ORAL_TABLET | ORAL | Status: AC
  Administered 2015-09-02: 650 mg
  Filled 2015-09-02: qty 2

## 2015-09-02 MED ORDER — ACETAMINOPHEN 325 MG PO TABS: 650.0000 mg | ORAL_TABLET | Freq: Once | ORAL | Status: DC

## 2015-09-02 MED ORDER — DIPHENHYDRAMINE HCL 25 MG PO TABS
25.0000 mg | ORAL_TABLET | Freq: Once | ORAL | Status: DC
  Filled 2015-09-02: qty 1

## 2015-09-02 MED ORDER — SODIUM CHLORIDE 0.9 % IV SOLN
3.0000 mg/kg | INTRAVENOUS | Status: DC
  Administered 2015-09-02: 200 mg via INTRAVENOUS
  Filled 2015-09-02: qty 20

## 2015-09-02 MED ORDER — DIPHENHYDRAMINE HCL 25 MG PO CAPS
ORAL_CAPSULE | ORAL | Status: AC
Start: 2015-09-02 — End: 2015-09-02
  Administered 2015-09-02: 25 mg
  Filled 2015-09-02: qty 1

## 2015-09-02 MED ORDER — LORATADINE 10 MG PO TABS: 10.0000 mg | ORAL_TABLET | Freq: Once | ORAL | Status: DC

## 2015-09-03 ENCOUNTER — Ambulatory Visit (HOSPITAL_COMMUNITY): Payer: 59 | Attending: Cardiology

## 2015-09-03 ENCOUNTER — Other Ambulatory Visit: Payer: Self-pay

## 2015-09-03 DIAGNOSIS — I340 Nonrheumatic mitral (valve) insufficiency: Secondary | ICD-10-CM | POA: Insufficient documentation

## 2015-09-03 DIAGNOSIS — R0609 Other forms of dyspnea: Secondary | ICD-10-CM | POA: Insufficient documentation

## 2015-09-03 DIAGNOSIS — I517 Cardiomegaly: Secondary | ICD-10-CM | POA: Insufficient documentation

## 2015-09-03 DIAGNOSIS — I10 Essential (primary) hypertension: Secondary | ICD-10-CM | POA: Insufficient documentation

## 2015-09-03 DIAGNOSIS — I5189 Other ill-defined heart diseases: Secondary | ICD-10-CM | POA: Diagnosis not present

## 2015-09-04 ENCOUNTER — Telehealth: Payer: Self-pay | Admitting: *Deleted

## 2015-09-04 NOTE — Telephone Encounter (Signed)
-----   Message from Chilton Si, MD sent at 09/04/2015  8:00 AM EST ----- Echo showed that her heart does not relax completely.  This is what we suspected from the CPX test.  It was otherwise normal.  Continue metoprolol as planned.

## 2015-09-04 NOTE — Telephone Encounter (Signed)
LEFT MESSAGE TO CALL BACK

## 2015-09-05 NOTE — Telephone Encounter (Signed)
Spoke to patient. Result given . Verbalized understanding  

## 2015-10-11 ENCOUNTER — Other Ambulatory Visit (HOSPITAL_COMMUNITY): Payer: Self-pay | Admitting: *Deleted

## 2015-10-15 ENCOUNTER — Encounter (HOSPITAL_COMMUNITY)
Admission: RE | Admit: 2015-10-15 | Discharge: 2015-10-15 | Disposition: A | Discharge status: Home / Self Care | Payer: 59 | Source: Ambulatory Visit | Attending: Rheumatology | Admitting: Rheumatology

## 2015-10-15 DIAGNOSIS — H20029 Recurrent acute iridocyclitis, unspecified eye: Secondary | ICD-10-CM | POA: Insufficient documentation

## 2015-10-15 MED ORDER — DIPHENHYDRAMINE HCL 25 MG PO CAPS
ORAL_CAPSULE | ORAL | Status: AC
  Administered 2015-10-15: 25 mg via ORAL
  Filled 2015-10-15: qty 1

## 2015-10-15 MED ORDER — DIPHENHYDRAMINE HCL 25 MG PO TABS
25.0000 mg | ORAL_TABLET | ORAL | Status: DC
  Administered 2015-10-15: 25 mg via ORAL
  Filled 2015-10-15: qty 1

## 2015-10-15 MED ORDER — INFLIXIMAB 100 MG IV SOLR
200.0000 mg | INTRAVENOUS | Status: DC
  Administered 2015-10-15: 200 mg via INTRAVENOUS
  Filled 2015-10-15: qty 20

## 2015-10-15 MED ORDER — SODIUM CHLORIDE 0.9 % IV SOLN
INTRAVENOUS | Status: DC
  Administered 2015-10-15: 12:00:00 via INTRAVENOUS

## 2015-10-15 MED ORDER — ACETAMINOPHEN 325 MG PO TABS
650.0000 mg | ORAL_TABLET | ORAL | Status: DC
  Administered 2015-10-15: 650 mg via ORAL

## 2015-10-15 MED ORDER — ACETAMINOPHEN 325 MG PO TABS
ORAL_TABLET | ORAL | Status: AC
  Administered 2015-10-15: 650 mg via ORAL
  Filled 2015-10-15: qty 2

## 2015-10-28 ENCOUNTER — Other Ambulatory Visit: Payer: Self-pay | Admitting: *Deleted

## 2015-10-28 MED ORDER — METOPROLOL TARTRATE 25 MG PO TABS: 12.5000 mg | ORAL_TABLET | Freq: Two times a day (BID) | ORAL | Status: DC

## 2015-11-22 ENCOUNTER — Other Ambulatory Visit (HOSPITAL_COMMUNITY): Payer: Self-pay | Admitting: *Deleted

## 2015-11-25 ENCOUNTER — Ambulatory Visit (HOSPITAL_COMMUNITY)
Admission: RE | Admit: 2015-11-25 | Discharge: 2015-11-25 | Disposition: A | Discharge status: Home / Self Care | Payer: 59 | Source: Ambulatory Visit | Attending: Rheumatology | Admitting: Rheumatology

## 2015-11-25 DIAGNOSIS — H20029 Recurrent acute iridocyclitis, unspecified eye: Secondary | ICD-10-CM | POA: Insufficient documentation

## 2015-11-25 LAB — COMPREHENSIVE METABOLIC PANEL
ALBUMIN: 4 g/dL (ref 3.5–5.0)
ALK PHOS: 68 U/L (ref 38–126)
ALT: 17 U/L (ref 14–54)
ANION GAP: 11 (ref 5–15)
AST: 23 U/L (ref 15–41)
BILIRUBIN TOTAL: 0.6 mg/dL (ref 0.3–1.2)
BUN: 15 mg/dL (ref 6–20)
CALCIUM: 9 mg/dL (ref 8.9–10.3)
CO2: 23 mmol/L (ref 22–32)
Chloride: 108 mmol/L (ref 101–111)
Creatinine, Ser: 0.93 mg/dL (ref 0.44–1.00)
GFR calc Af Amer: >60 mL/min (ref >60)
GLUCOSE: 198 mg/dL — AB (ref 65–99)
Potassium: 3.7 mmol/L (ref 3.5–5.1)
Sodium: 142 mmol/L (ref 135–145)
TOTAL PROTEIN: 6.3 g/dL — AB (ref 6.5–8.1)

## 2015-11-25 LAB — DIFFERENTIAL
BASOS PCT: 0 %
Basophils Absolute: 0 10*3/uL (ref 0.0–0.1)
EOS PCT: 3 %
Eosinophils Absolute: 0.2 10*3/uL (ref 0.0–0.7)
Lymphocytes Relative: 42 %
Lymphs Abs: 2.3 10*3/uL (ref 0.7–4.0)
MONO ABS: 0.4 10*3/uL (ref 0.1–1.0)
Monocytes Relative: 7 %
NEUTROS ABS: 2.7 10*3/uL (ref 1.7–7.7)
NEUTROS PCT: 48 %

## 2015-11-25 LAB — CBC
HEMATOCRIT: 37.2 % (ref 36.0–46.0)
HEMOGLOBIN: 12.8 g/dL (ref 12.0–15.0)
MCH: 30.6 pg (ref 26.0–34.0)
MCHC: 34.4 g/dL (ref 30.0–36.0)
MCV: 89 fL (ref 78.0–100.0)
Platelets: 247 10*3/uL (ref 150–400)
RBC: 4.18 MIL/uL (ref 3.87–5.11)
RDW: 13.7 % (ref 11.5–15.5)
WBC: 5.5 10*3/uL (ref 4.0–10.5)

## 2015-11-25 MED ORDER — SODIUM CHLORIDE 0.9 % IV SOLN
INTRAVENOUS | Status: DC
  Administered 2015-11-25: 11:00:00 via INTRAVENOUS

## 2015-11-25 MED ORDER — DIPHENHYDRAMINE HCL 25 MG PO CAPS
ORAL_CAPSULE | ORAL | Status: AC
  Filled 2015-11-25: qty 1

## 2015-11-25 MED ORDER — DIPHENHYDRAMINE HCL 25 MG PO CAPS
25.0000 mg | ORAL_CAPSULE | ORAL | Status: DC
  Administered 2015-11-25: 25 mg via ORAL

## 2015-11-25 MED ORDER — ACETAMINOPHEN 325 MG PO TABS
ORAL_TABLET | ORAL | Status: AC
  Filled 2015-11-25: qty 2

## 2015-11-25 MED ORDER — ACETAMINOPHEN 325 MG PO TABS
650.0000 mg | ORAL_TABLET | ORAL | Status: DC
  Administered 2015-11-25: 650 mg via ORAL

## 2015-11-25 MED ORDER — SODIUM CHLORIDE 0.9 % IV SOLN
3.0000 mg/kg | INTRAVENOUS | Status: DC
  Administered 2015-11-25: 200 mg via INTRAVENOUS
  Filled 2015-11-25: qty 20

## 2016-01-03 ENCOUNTER — Other Ambulatory Visit (HOSPITAL_COMMUNITY): Payer: Self-pay | Admitting: *Deleted

## 2016-01-06 ENCOUNTER — Ambulatory Visit (HOSPITAL_COMMUNITY)
Admission: RE | Admit: 2016-01-06 | Discharge: 2016-01-06 | Disposition: A | Discharge status: Home / Self Care | Payer: 59 | Source: Ambulatory Visit | Attending: Rheumatology | Admitting: Rheumatology

## 2016-01-06 DIAGNOSIS — H20029 Recurrent acute iridocyclitis, unspecified eye: Secondary | ICD-10-CM | POA: Diagnosis not present

## 2016-01-06 LAB — CBC WITH DIFFERENTIAL/PLATELET
BASOS ABS: 0 10*3/uL (ref 0.0–0.1)
Basophils Relative: 0 %
EOS PCT: 2 %
Eosinophils Absolute: 0.2 10*3/uL (ref 0.0–0.7)
HEMATOCRIT: 41.5 % (ref 36.0–46.0)
Hemoglobin: 14.2 g/dL (ref 12.0–15.0)
LYMPHS PCT: 33 %
Lymphs Abs: 2.5 10*3/uL (ref 0.7–4.0)
MCH: 30.3 pg (ref 26.0–34.0)
MCHC: 34.2 g/dL (ref 30.0–36.0)
MCV: 88.5 fL (ref 78.0–100.0)
MONO ABS: 0.2 10*3/uL (ref 0.1–1.0)
MONOS PCT: 2 %
Neutro Abs: 4.7 10*3/uL (ref 1.7–7.7)
Neutrophils Relative %: 63 %
PLATELETS: 252 10*3/uL (ref 150–400)
RBC: 4.69 MIL/uL (ref 3.87–5.11)
RDW: 13.3 % (ref 11.5–15.5)
WBC: 7.5 10*3/uL (ref 4.0–10.5)

## 2016-01-06 LAB — COMPREHENSIVE METABOLIC PANEL
ALT: 18 U/L (ref 14–54)
ANION GAP: 8 (ref 5–15)
AST: 25 U/L (ref 15–41)
Albumin: 4.1 g/dL (ref 3.5–5.0)
Alkaline Phosphatase: 69 U/L (ref 38–126)
BILIRUBIN TOTAL: 0.6 mg/dL (ref 0.3–1.2)
BUN: 17 mg/dL (ref 6–20)
CHLORIDE: 108 mmol/L (ref 101–111)
CO2: 25 mmol/L (ref 22–32)
Calcium: 9.3 mg/dL (ref 8.9–10.3)
Creatinine, Ser: 0.8 mg/dL (ref 0.44–1.00)
Glucose, Bld: 209 mg/dL — ABNORMAL HIGH (ref 65–99)
POTASSIUM: 4 mmol/L (ref 3.5–5.1)
Sodium: 141 mmol/L (ref 135–145)
Total Protein: 7.2 g/dL (ref 6.5–8.1)

## 2016-01-06 MED ORDER — ACETAMINOPHEN 325 MG PO TABS
650.0000 mg | ORAL_TABLET | ORAL | Status: DC
  Administered 2016-01-06: 650 mg via ORAL

## 2016-01-06 MED ORDER — ACETAMINOPHEN 325 MG PO TABS
ORAL_TABLET | ORAL | Status: AC
  Filled 2016-01-06: qty 2

## 2016-01-06 MED ORDER — SODIUM CHLORIDE 0.9 % IV SOLN
INTRAVENOUS | Status: DC
  Administered 2016-01-06: 11:00:00 via INTRAVENOUS

## 2016-01-06 MED ORDER — DIPHENHYDRAMINE HCL 25 MG PO CAPS
25.0000 mg | ORAL_CAPSULE | ORAL | Status: DC
  Administered 2016-01-06: 25 mg via ORAL

## 2016-01-06 MED ORDER — SODIUM CHLORIDE 0.9 % IV SOLN
3.0000 mg/kg | INTRAVENOUS | Status: DC
  Administered 2016-01-06: 200 mg via INTRAVENOUS
  Filled 2016-01-06: qty 20

## 2016-01-06 MED ORDER — DIPHENHYDRAMINE HCL 25 MG PO CAPS
ORAL_CAPSULE | ORAL | Status: AC
  Filled 2016-01-06: qty 1

## 2016-02-14 ENCOUNTER — Other Ambulatory Visit (HOSPITAL_COMMUNITY): Payer: Self-pay | Admitting: *Deleted

## 2016-02-17 ENCOUNTER — Ambulatory Visit (HOSPITAL_COMMUNITY)
Admission: RE | Admit: 2016-02-17 | Discharge: 2016-02-17 | Disposition: A | Discharge status: Home / Self Care | Payer: 59 | Source: Ambulatory Visit | Attending: Rheumatology | Admitting: Rheumatology

## 2016-02-17 DIAGNOSIS — H209 Unspecified iridocyclitis: Secondary | ICD-10-CM | POA: Diagnosis not present

## 2016-02-17 LAB — COMPREHENSIVE METABOLIC PANEL
ALT: 19 U/L (ref 14–54)
AST: 25 U/L (ref 15–41)
Albumin: 4.1 g/dL (ref 3.5–5.0)
Alkaline Phosphatase: 65 U/L (ref 38–126)
Anion gap: 9 (ref 5–15)
BILIRUBIN TOTAL: 0.9 mg/dL (ref 0.3–1.2)
BUN: 15 mg/dL (ref 6–20)
CO2: 23 mmol/L (ref 22–32)
CREATININE: 0.82 mg/dL (ref 0.44–1.00)
Calcium: 9.1 mg/dL (ref 8.9–10.3)
Chloride: 110 mmol/L (ref 101–111)
Glucose, Bld: 116 mg/dL — ABNORMAL HIGH (ref 65–99)
Potassium: 3.9 mmol/L (ref 3.5–5.1)
Sodium: 142 mmol/L (ref 135–145)
TOTAL PROTEIN: 6.9 g/dL (ref 6.5–8.1)

## 2016-02-17 LAB — CBC WITH DIFFERENTIAL/PLATELET
BASOS PCT: 0 %
Basophils Absolute: 0 10*3/uL (ref 0.0–0.1)
Eosinophils Absolute: 0.1 10*3/uL (ref 0.0–0.7)
Eosinophils Relative: 2 %
HEMATOCRIT: 38.3 % (ref 36.0–46.0)
Hemoglobin: 13 g/dL (ref 12.0–15.0)
LYMPHS ABS: 1.7 10*3/uL (ref 0.7–4.0)
LYMPHS PCT: 25 %
MCH: 29.2 pg (ref 26.0–34.0)
MCHC: 33.9 g/dL (ref 30.0–36.0)
MCV: 86.1 fL (ref 78.0–100.0)
MONO ABS: 0.5 10*3/uL (ref 0.1–1.0)
MONOS PCT: 7 %
NEUTROS ABS: 4.3 10*3/uL (ref 1.7–7.7)
NEUTROS PCT: 66 %
PLATELETS: 294 10*3/uL (ref 150–400)
RBC: 4.45 MIL/uL (ref 3.87–5.11)
RDW: 13.6 % (ref 11.5–15.5)
WBC: 6.7 10*3/uL (ref 4.0–10.5)

## 2016-02-17 MED ORDER — ACETAMINOPHEN 325 MG PO TABS
ORAL_TABLET | ORAL | Status: AC
  Administered 2016-02-17: 650 mg via ORAL
  Filled 2016-02-17: qty 2

## 2016-02-17 MED ORDER — SODIUM CHLORIDE 0.9 % IV SOLN
INTRAVENOUS | Status: DC
  Administered 2016-02-17: 12:00:00 via INTRAVENOUS

## 2016-02-17 MED ORDER — ACETAMINOPHEN 325 MG PO TABS
650.0000 mg | ORAL_TABLET | ORAL | Status: DC
  Administered 2016-02-17: 650 mg via ORAL

## 2016-02-17 MED ORDER — SODIUM CHLORIDE 0.9 % IV SOLN
3.0000 mg/kg | INTRAVENOUS | Status: DC
  Administered 2016-02-17: 200 mg via INTRAVENOUS
  Filled 2016-02-17: qty 20

## 2016-02-17 MED ORDER — DIPHENHYDRAMINE HCL 25 MG PO CAPS
25.0000 mg | ORAL_CAPSULE | ORAL | Status: DC
  Administered 2016-02-17: 25 mg via ORAL

## 2016-02-17 MED ORDER — DIPHENHYDRAMINE HCL 25 MG PO CAPS
ORAL_CAPSULE | ORAL | Status: AC
  Administered 2016-02-17: 25 mg via ORAL
  Filled 2016-02-17: qty 1

## 2016-03-27 ENCOUNTER — Other Ambulatory Visit (HOSPITAL_COMMUNITY): Payer: Self-pay | Admitting: *Deleted

## 2016-03-30 ENCOUNTER — Ambulatory Visit (HOSPITAL_COMMUNITY)
Admission: RE | Admit: 2016-03-30 | Discharge: 2016-03-30 | Disposition: A | Discharge status: Home / Self Care | Payer: 59 | Source: Ambulatory Visit | Attending: Rheumatology | Admitting: Rheumatology

## 2016-03-30 DIAGNOSIS — H209 Unspecified iridocyclitis: Secondary | ICD-10-CM | POA: Diagnosis not present

## 2016-03-30 MED ORDER — DIPHENHYDRAMINE HCL 25 MG PO CAPS
ORAL_CAPSULE | ORAL | Status: AC
  Administered 2016-03-30: 25 mg
  Filled 2016-03-30: qty 1

## 2016-03-30 MED ORDER — INFLIXIMAB 100 MG IV SOLR
3.0000 mg/kg | INTRAVENOUS | Status: DC
  Administered 2016-03-30: 200 mg via INTRAVENOUS
  Filled 2016-03-30: qty 20

## 2016-03-30 MED ORDER — ACETAMINOPHEN 325 MG PO TABS: 650.0000 mg | ORAL_TABLET | ORAL | Status: DC

## 2016-03-30 MED ORDER — DIPHENHYDRAMINE HCL 25 MG PO CAPS: 25.0000 mg | ORAL_CAPSULE | ORAL | Status: DC

## 2016-03-30 MED ORDER — ACETAMINOPHEN 325 MG PO TABS
ORAL_TABLET | ORAL | Status: AC
  Administered 2016-03-30: 650 mg
  Filled 2016-03-30: qty 2

## 2016-03-30 MED ORDER — SODIUM CHLORIDE 0.9 % IV SOLN
INTRAVENOUS | Status: DC
  Administered 2016-03-30: 11:00:00 via INTRAVENOUS

## 2016-05-04 ENCOUNTER — Ambulatory Visit (INDEPENDENT_AMBULATORY_CARE_PROVIDER_SITE_OTHER): Payer: 59 | Admitting: Cardiovascular Disease

## 2016-05-04 ENCOUNTER — Encounter: Payer: Self-pay | Admitting: Cardiovascular Disease

## 2016-05-04 VITALS — BP 140/84 | HR 74 | Ht 63.0 in | Wt 138.8 lb

## 2016-05-04 DIAGNOSIS — I5189 Other ill-defined heart diseases: Secondary | ICD-10-CM

## 2016-05-04 DIAGNOSIS — I10 Essential (primary) hypertension: Secondary | ICD-10-CM | POA: Diagnosis not present

## 2016-05-04 DIAGNOSIS — I471 Supraventricular tachycardia: Secondary | ICD-10-CM

## 2016-05-04 DIAGNOSIS — I519 Heart disease, unspecified: Secondary | ICD-10-CM

## 2016-05-04 MED ORDER — METOPROLOL TARTRATE 25 MG PO TABS: 25.0000 mg | ORAL_TABLET | Freq: Two times a day (BID) | ORAL | Status: DC

## 2016-05-04 NOTE — Patient Instructions (Signed)
Medication Instructions:  INCREASE YOUR METOPROLOL TO 25 MG TWICE A DAY   Labwork: NONE  Testing/Procedures: NONE   Follow-Up: Your physician wants you to follow-up in: 3 MONTH OV You will receive a reminder letter in the mail two months in advance. If you don't receive a letter, please call our office to schedule the follow-up appointment.  Any Other Special Instructions Will Be Listed Below (If Applicable). WILL ARRANGE FOR YOU TO SEE ONE OF THE EP PHYSICIANS AT [address] IF YOU DO NOT HEAR FROM THEM BY MID WEEK PLEASE CALL THE OFFICE   If you need a refill on your cardiac medications before your next appointment, please call your pharmacy.

## 2016-05-04 NOTE — Progress Notes (Signed)
Cardiology Office Note   Date:  05/04/2016   ID:  Renee Jones, DOB 12-17-1950, MRN [id number]  PCP:  Renee Jones  Cardiologist:   Renee Jones   Chief Complaint  Patient presents with  . Follow-up    cramping; in legs    Patient ID:   Renee Jones is a 65 y.o. female paroxysmal SVT, HTN and sarcoidosis.    History of Present Illness: [name] reports episodes of SVT that have been occurring for as long as she can remember.  She previously took a beta blocker which helped her symptoms but stopped taking it 20 years ago because she didn't think she needed it anymore.  [name] was previously a patient of Renee Jones and had a Holter monitor documenting SVT.  She remembers him telling her to use Valsalva maneuvers when these episodes occur.  [name] was evaluated in the ED 02/2015 with elevated BP to 197/120.  She received IV medication with improvement.  She followed up with her PCP, Renee Jones, who switched Olmesartan 20mg   to Losartan 50mg .  Since then her BP has been better controlled.  [name] had a cardiac MRI 10/2012 that did not reveal any delayed enhancement concerning for cardiac involvement of her sarcoid.  She underwent CPX testing to evaluate shortness of breath and it was suggestive of diastolic dysfunction.  She was started on metoprolol for SVT and it also helped her shortness of breath.  She was referred for an echo 08/2015 that revealed LVEF 55-60% with grade 1 diastolic dysfunction.  Since her last appointment her husband died. He had struggled with metastatic prostate cancer.  She had a hard time living alone after this happened so she moved in with her daughter and granddaughter.  She continues to Have episodes of SVT approximately 3-4 times per week. When she does have a mid happens 2 or 3 times per day. Each episode lasts for several minutes.  She had an episode of SVT while getting her Remicaid infusion.  The staff was alarmed because  her heart rate sustained at 1 24 bpm. She was able to get it to stop with vagal maneuvers and deep breathing.   After these episodes she feels extremely exhausted and short of breath.  She denies chest pain, lower extremity edea, orthopnea or PND.   Past Medical History  Diagnosis Date  . Hypertension   . GERD (gastroesophageal reflux disease)   . Lumbar disc disease   . Tachycardia   . Sarcoidosis (HCC)   . Allergy   . Asthma   . Neuromuscular disorder (HCC)   . SVT (supraventricular tachycardia) (HCC) 08/28/2015  . Essential hypertension 08/28/2015  . Diastolic dysfunction 08/28/2015    Past Surgical History  Procedure Laterality Date  . Lumbar spine surgery    . Total abdominal hysterectomy    . Rotator cuff repair       Current Outpatient Prescriptions  Medication Sig Dispense Refill  . albuterol (PROVENTIL HFA;VENTOLIN HFA) 108 (90 BASE) MCG/ACT inhaler Inhale 2 puffs into the lungs every 6 (six) hours as needed for wheezing or shortness of breath.    . celecoxib (CELEBREX) 200 MG capsule Take 200 mg by mouth daily.    . chlorhexidine (PERIDEX) 0.12 % solution RINSE TWICE A DAY AS DIRECTED  0  . fluticasone (FLONASE) 50 MCG/ACT nasal spray Place 2 sprays into both nostrils daily as needed for allergies or rhinitis.    . folic acid (  FOLVITE) 1 MG tablet Take 2 mg by mouth daily.     Marland Kitchen losartan (COZAAR) 50 MG tablet Take 50 mg by mouth daily.  1  . metoprolol tartrate (LOPRESSOR) 25 MG tablet Take 1 tablet (25 mg total) by mouth 2 (two) times daily. 180 tablet 1  . ondansetron (ZOFRAN) 4 MG tablet Take 4 mg by mouth as needed.  1  . pantoprazole (PROTONIX) 40 MG tablet Take 40 mg by mouth daily.  5  . Polyethyl Glycol-Propyl Glycol (SYSTANE OP) Apply to eye daily.    . tapentadol (NUCYNTA) 50 MG TABS tablet Take 50 mg by mouth 3 (three) times daily as needed.     No current facility-administered medications for this visit.    Allergies:   Codeine; Erythromycin; Sulfa  antibiotics; and Tramadol    Social History:  The patient  reports that she has never smoked. She has never used smokeless tobacco. She reports that she does not drink alcohol or use illicit drugs.   Family History:  The patient's family history includes Scleroderma in her daughter. She was adopted.    ROS:  Please see the history of present illness.   Otherwise, review of systems are positive for none.   All other systems are reviewed and negative.    PHYSICAL EXAM: VS:  BP 140/84 mmHg  Pulse 74  Ht 5\' 3"  (1.6 m)  Wt 138 lb 12.8 oz (62.959 kg)  BMI 24.59 kg/m2 , BMI Body mass index is 24.59 kg/(m^2). GENERAL:  Well appearing HEENT:  Pupils equal round and reactive, fundi not visualized, oral mucosa unremarkable NECK:  No jugular venous distention, waveform within normal limits, carotid upstroke brisk and symmetric, no bruits, no thyromegaly LYMPHATICS:  No cervical adenopathy LUNGS:  Clear to auscultation bilaterally CHEST:  Unremarkable HEART:  PMI not displaced or sustained,S1 and S2 within normal limits, no S3, no S4, no clicks, no rubs, no murmurs ABD:  Flat, positive bowel sounds normal in frequency in pitch, no bruits, no rebound, no guarding, no midline pulsatile mass, no hepatomegaly, no splenomegaly EXT:  2 plus pulses throughout, no edema, no cyanosis no clubbing SKIN:  No rashes no nodules NEURO:  Cranial nerves II through XII grossly intact, motor grossly intact throughout PSYCH:  Cognitively intact, oriented to person place and time  EKG:  EKG is ordered today.  05/04/16:Sinus rhythm.  Rate 74 bpm.  Inferior TWI.   Recent Labs: 02/17/2016: ALT 19; BUN 15; Creatinine, Ser 0.82; Hemoglobin 13.0; Platelets 294; Potassium 3.9; Sodium 142    Lipid Panel No results found for: CHOL, TRIG, HDL, CHOLHDL, VLDL, LDLCALC, LDLDIRECT    Wt Readings from Last 3 Encounters:  05/04/16 138 lb 12.8 oz (62.959 kg)  03/30/16 140 lb (63.504 kg)  02/17/16 141 lb (63.957 kg)       Other studies Reviewed: Additional studies/ records that were reviewed today include: PCP note  Review of the above records demonstrates:  Please see elsewhere in the note.     CPX 05/01/15: Conclusion: Exercise testing with gas-exchange demonstrates a mild functional impairment when compared to matched sedentary norms. There is no significant evidence of EIB. The combination of reduced PVO2, hypertensive BP, elevated VE/VCO2 slope, and flat O2 pulse suggests possible circulatory limitations (specifically diastolic dysfunction).   ASSESSMENT AND PLAN:  # SVT: Ms. 05/03/15 continues to have frequent episodes of SVT on metoprolol.   We will increase metoprolol to 25 mg twice daily.  We will also refer her to electrophysiology, and  she would like to know more about catheter ablation.   # Diastolic dysfunction: CPX was suggestive of diastolic dysfunction and her symptoms have improved with metoprolol.  However, her echo only showed grade 1 diastolic function and she does not clinically appear to have heart failure. We will increase her metoprolol as above.  # Hypertension: BP well-controlled.  Continue losartan and increase metoprolol as above.   # Sarcoidosis: No evidence of cardiac involvement on MRI 10/2012.   Current medicines are reviewed at length with the patient today.  The patient does not have concerns regarding medicines.  The following changes have been made:  Increase metoprolol to 25 mg q12h  Labs/ tests ordered today include: none.  Orders Placed This Encounter  Procedures  . Ambulatory referral to Cardiac Electrophysiology  . EKG 12-Lead     Disposition:   FU with Dr. Elmarie Shiley C. [place] in 3 months    Signed, Renee Jones  05/04/2016 2:26 PM    [facility]

## 2016-05-05 ENCOUNTER — Telehealth: Payer: Self-pay | Admitting: *Deleted

## 2016-05-05 ENCOUNTER — Encounter: Payer: Self-pay | Admitting: *Deleted

## 2016-05-08 ENCOUNTER — Other Ambulatory Visit (HOSPITAL_COMMUNITY): Payer: Self-pay | Admitting: *Deleted

## 2016-05-11 ENCOUNTER — Ambulatory Visit (HOSPITAL_COMMUNITY)
Admission: RE | Admit: 2016-05-11 | Discharge: 2016-05-11 | Disposition: A | Discharge status: Home / Self Care | Payer: 59 | Source: Ambulatory Visit | Attending: Rheumatology | Admitting: Rheumatology

## 2016-05-11 DIAGNOSIS — H209 Unspecified iridocyclitis: Secondary | ICD-10-CM | POA: Diagnosis not present

## 2016-05-11 NOTE — Progress Notes (Signed)
Pt came in today for Remicade.  Pt has been on Antibiotics for a infected tooth.  She stopped taking antibiotics last Thursday 05/07/2016.  I called Dr Corliss Skains, she said to have patient reschedule when she has been off for 2 full weeks

## 2016-05-25 ENCOUNTER — Institutional Professional Consult (permissible substitution): Payer: 59 | Admitting: Cardiology

## 2016-05-31 NOTE — Progress Notes (Signed)
Electrophysiology Office Note   Date:  06/01/2016   ID:  [name], DOB Feb 26, 1951, MRN [id number]  PCP:  Clayborn Heron, MD  Cardiologist:  Duke Salvia Primary Electrophysiologist:  Chizara Mena Jorja Loa, MD    Chief Complaint  Patient presents with  . Advice Only    SVT     History of Present Illness: Renee Jones is a 65 y.o. female who presents today for electrophysiology evaluation.   Referred for evaluation of SVT.   SVT has been occurring for as long as she can remember.  Previously took a beta blocker which helped her symptoms but stopped taking it 20 years ago because she didn't think she needed it anymore.  Had a Holter monitor documenting SVT.  She remembers him telling her to use Valsalva maneuvers when these episodes occur.  Cardiac MRI 10/2012 that did not reveal any delayed enhancement concerning for cardiac involvement of her sarcoid.  She underwent CPX testing to evaluate shortness of breath and it was suggestive of diastolic dysfunction.  She was started on metoprolol for SVT and it also helped her shortness of breath.  She was referred for an echo 08/2015 that revealed LVEF 55-60% with grade 1 diastolic dysfunction.  Recently had her husband die of prostate cancer.  Living alone at the time but moved in with her daughter. Has SVT episodes 2-3 episodes per week.  Last several minutes.  Stops with vagal maneuvers and deep breathing. She has associated SOB.    Today, she denies symptoms of palpitations, chest pain, shortness of breath, orthopnea, PND, lower extremity edema, claudication, dizziness, presyncope, syncope, bleeding, or neurologic sequela. The patient is tolerating medications without difficulties and is otherwise without complaint today. She says that she gets episodes of palpitations multiple times a week. She says that she feels like there is a fluttering in her chest almost like a vibrating sensation. She says that the only thing that makes it  feel better or when she does vagal maneuvers. When she is not having palpitations, she feels well without major complaint.   Past Medical History:  Diagnosis Date  . Allergy   . Asthma   . Diastolic dysfunction 08/28/2015  . Essential hypertension 08/28/2015  . GERD (gastroesophageal reflux disease)   . Hypertension   . Lumbar disc disease   . Neuromuscular disorder (HCC)   . Sarcoidosis (HCC)   . SVT (supraventricular tachycardia) (HCC) 08/28/2015  . Tachycardia    Past Surgical History:  Procedure Laterality Date  . LUMBAR SPINE SURGERY    . ROTATOR CUFF REPAIR    . TOTAL ABDOMINAL HYSTERECTOMY       Current Outpatient Prescriptions  Medication Sig Dispense Refill  . albuterol (PROVENTIL HFA;VENTOLIN HFA) 108 (90 BASE) MCG/ACT inhaler Inhale 2 puffs into the lungs every 6 (six) hours as needed for wheezing or shortness of breath.    . fluticasone (FLONASE) 50 MCG/ACT nasal spray Place 2 sprays into both nostrils daily as needed for allergies or rhinitis.    . folic acid (FOLVITE) 1 MG tablet Take 2 mg by mouth daily.     Marland Kitchen inFLIXimab in sodium chloride 0.9 % Inject into the vein. Every 2 weeks as directed    . losartan (COZAAR) 50 MG tablet Take 50 mg by mouth daily.  1  . ondansetron (ZOFRAN) 4 MG tablet Take 4 mg by mouth as needed for nausea or vomiting.    . pantoprazole (PROTONIX) 40 MG tablet Take 40 mg by mouth daily.  5  . Polyethyl Glycol-Propyl Glycol (SYSTANE OP) Apply to eye daily.    . tapentadol (NUCYNTA) 50 MG TABS tablet Take 50 mg by mouth 3 (three) times daily as needed.    . metoprolol (LOPRESSOR) 50 MG tablet Take 1 tablet (50 mg total) by mouth 2 (two) times daily. 180 tablet 3   No current facility-administered medications for this visit.     Allergies:   Codeine; Erythromycin; Sulfa antibiotics; and Tramadol   Social History:  The patient  reports that she has never smoked. She has never used smokeless tobacco. She reports that she does not drink  alcohol or use drugs.   Family History:  The patient's family history includes Scleroderma in her daughter. She was adopted.    ROS:  Please see the history of present illness.   Otherwise, review of systems is positive for constipation, back pain, muscle pain.   All other systems are reviewed and negative.    PHYSICAL EXAM: VS:  BP (!) 144/86   Pulse 78   Ht 5\' 3"  (1.6 m)   Wt 140 lb 12.8 oz (63.9 kg)   BMI 24.94 kg/m  , BMI Body mass index is 24.94 kg/m. GEN: Well nourished, well developed, in no acute distress  HEENT: normal  Neck: no JVD, carotid bruits, or masses Cardiac: RRR; no murmurs, rubs, or gallops,no edema  Respiratory:  clear to auscultation bilaterally, normal work of breathing GI: soft, nontender, nondistended, + BS MS: no deformity or atrophy  Skin: warm and dry Neuro:  Strength and sensation are intact Psych: euthymic mood, full affect  EKG:  EKG is not ordered today. Personal review of the ekg ordered 7/17/17shows sinus rhythm, inferior TWI  Recent Labs: 02/17/2016: ALT 19; BUN 15; Creatinine, Ser 0.82; Hemoglobin 13.0; Platelets 294; Potassium 3.9; Sodium 142    Lipid Panel  No results found for: CHOL, TRIG, HDL, CHOLHDL, VLDL, LDLCALC, LDLDIRECT   Wt Readings from Last 3 Encounters:  06/01/16 140 lb 12.8 oz (63.9 kg)  05/11/16 138 lb (62.6 kg)  05/04/16 138 lb 12.8 oz (63 kg)      Other studies Reviewed: Additional studies/ records that were reviewed today include: TTE 09/03/15  Review of the above records today demonstrates:  - Normal LV systolic function; grade 1 diastolic dysfunction; mild   MR.  2014 CMRI 1. Normal LV size and systolic function, EF 69%.  2. Normal RV size and systolic function.  3. No myocardial delayed enhancement, so no definitive evidence for prior MI, infiltrative disease, or myocarditis.  4. No evidence on this exam for cardiac sarcoidosis.  ASSESSMENT AND PLAN:  1.  SVT: on metoprolol but having  breakthrough symptoms.  Controlled at times with vagal maneuvers.  Her EKGs in the system showed episodes of heart rate of around 120 with obvious P waves and a long PR tachycardia. It is possible that this is due to either atrial tachycardia or inappropriate sinus tachycardia. At this time, she is do not feel that invasive management is appropriate. We Karron Goens increase her metoprolol to 50 mg twice a day. We'll see her back in 3 months.  2. Hypertension: Currently elevated, Celest Reitz increase metoprolol.  3. Diastolic dysfunction: Grade one, metoprolol recently increased   Current medicines are reviewed at length with the patient today.   The patient does not have concerns regarding her medicines.  The following changes were made today:  Increase metoprolol to 50 mg BID  Labs/ tests ordered today include:  No orders  of the defined types were placed in this encounter.    Disposition:   FU with Hero Mccathern 3 months  Signed, Marrianne Sica Jorja Loa, MD  06/01/2016 12:06 PM     [facility] [address] [place] [address] [phone] (office) [phone] (fax)

## 2016-06-01 ENCOUNTER — Ambulatory Visit (INDEPENDENT_AMBULATORY_CARE_PROVIDER_SITE_OTHER): Payer: 59 | Admitting: Cardiology

## 2016-06-01 ENCOUNTER — Encounter (INDEPENDENT_AMBULATORY_CARE_PROVIDER_SITE_OTHER): Payer: Self-pay

## 2016-06-01 ENCOUNTER — Encounter: Payer: Self-pay | Admitting: Cardiology

## 2016-06-01 ENCOUNTER — Other Ambulatory Visit (HOSPITAL_COMMUNITY): Payer: Self-pay | Admitting: *Deleted

## 2016-06-01 VITALS — BP 144/86 | HR 78 | Ht 63.0 in | Wt 140.8 lb

## 2016-06-01 DIAGNOSIS — I471 Supraventricular tachycardia: Secondary | ICD-10-CM

## 2016-06-01 MED ORDER — METOPROLOL TARTRATE 50 MG PO TABS: 50.0000 mg | ORAL_TABLET | Freq: Two times a day (BID) | ORAL | 3 refills | Status: DC

## 2016-06-01 NOTE — Patient Instructions (Signed)
Your physician has recommended you make the following change in your medication:  1.) change metoprolol to 50 mg twice a day  Your physician recommends that you schedule a follow-up appointment in: 3 months with Dr. Elberta Fortis.

## 2016-06-02 ENCOUNTER — Encounter (HOSPITAL_COMMUNITY)
Admission: RE | Admit: 2016-06-02 | Discharge: 2016-06-02 | Disposition: A | Discharge status: Home / Self Care | Payer: 59 | Source: Ambulatory Visit | Attending: Rheumatology | Admitting: Rheumatology

## 2016-06-02 DIAGNOSIS — H209 Unspecified iridocyclitis: Secondary | ICD-10-CM | POA: Diagnosis not present

## 2016-06-02 LAB — CBC
HCT: 41.1 % (ref 36.0–46.0)
Hemoglobin: 13.7 g/dL (ref 12.0–15.0)
MCH: 30 pg (ref 26.0–34.0)
MCHC: 33.3 g/dL (ref 30.0–36.0)
MCV: 89.9 fL (ref 78.0–100.0)
Platelets: 334 10*3/uL (ref 150–400)
RBC: 4.57 MIL/uL (ref 3.87–5.11)
RDW: 13.9 % (ref 11.5–15.5)
WBC: 6.2 10*3/uL (ref 4.0–10.5)

## 2016-06-02 LAB — DIFFERENTIAL
Basophils Absolute: 0 10*3/uL (ref 0.0–0.1)
Basophils Relative: 0 %
EOS PCT: 3 %
Eosinophils Absolute: 0.2 10*3/uL (ref 0.0–0.7)
LYMPHS PCT: 31 %
Lymphs Abs: 2 10*3/uL (ref 0.7–4.0)
MONO ABS: 0.3 10*3/uL (ref 0.1–1.0)
MONOS PCT: 5 %
NEUTROS ABS: 3.8 10*3/uL (ref 1.7–7.7)
Neutrophils Relative %: 61 %

## 2016-06-02 LAB — COMPREHENSIVE METABOLIC PANEL
ALBUMIN: 4.3 g/dL (ref 3.5–5.0)
ALK PHOS: 74 U/L (ref 38–126)
ALT: 26 U/L (ref 14–54)
ANION GAP: 9 (ref 5–15)
AST: 30 U/L (ref 15–41)
BILIRUBIN TOTAL: 0.7 mg/dL (ref 0.3–1.2)
BUN: 19 mg/dL (ref 6–20)
CO2: 24 mmol/L (ref 22–32)
Calcium: 9.3 mg/dL (ref 8.9–10.3)
Chloride: 108 mmol/L (ref 101–111)
Creatinine, Ser: 0.83 mg/dL (ref 0.44–1.00)
GFR calc Af Amer: >60 mL/min (ref >60)
GFR calc non Af Amer: >60 mL/min (ref >60)
GLUCOSE: 137 mg/dL — AB (ref 65–99)
POTASSIUM: 4.2 mmol/L (ref 3.5–5.1)
SODIUM: 141 mmol/L (ref 135–145)
TOTAL PROTEIN: 7.3 g/dL (ref 6.5–8.1)

## 2016-06-02 MED ORDER — SODIUM CHLORIDE 0.9 % IV SOLN
INTRAVENOUS | Status: DC
  Administered 2016-06-02 (×2): via INTRAVENOUS

## 2016-06-02 MED ORDER — DIPHENHYDRAMINE HCL 25 MG PO CAPS
ORAL_CAPSULE | ORAL | Status: AC
  Filled 2016-06-02: qty 1

## 2016-06-02 MED ORDER — ACETAMINOPHEN 325 MG PO TABS
650.0000 mg | ORAL_TABLET | ORAL | Status: DC
  Administered 2016-06-02: 650 mg via ORAL

## 2016-06-02 MED ORDER — SODIUM CHLORIDE 0.9 % IV SOLN
3.0000 mg/kg | INTRAVENOUS | Status: DC
  Administered 2016-06-02: 200 mg via INTRAVENOUS
  Filled 2016-06-02: qty 20

## 2016-06-02 MED ORDER — DIPHENHYDRAMINE HCL 25 MG PO CAPS
25.0000 mg | ORAL_CAPSULE | ORAL | Status: DC
  Administered 2016-06-02: 25 mg via ORAL

## 2016-06-02 MED ORDER — ACETAMINOPHEN 325 MG PO TABS
ORAL_TABLET | ORAL | Status: AC
  Filled 2016-06-02: qty 2

## 2016-06-06 ENCOUNTER — Encounter: Payer: Self-pay | Admitting: Radiology

## 2016-06-22 DIAGNOSIS — H209 Unspecified iridocyclitis: Secondary | ICD-10-CM
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
## 2016-07-13 ENCOUNTER — Other Ambulatory Visit (HOSPITAL_COMMUNITY): Payer: Self-pay | Admitting: *Deleted

## 2016-07-14 ENCOUNTER — Ambulatory Visit (HOSPITAL_COMMUNITY)
Admission: RE | Admit: 2016-07-14 | Discharge: 2016-07-14 | Disposition: A | Discharge status: Home / Self Care | Payer: 59 | Source: Ambulatory Visit | Attending: Rheumatology | Admitting: Rheumatology

## 2016-07-14 DIAGNOSIS — Z79899 Other long term (current) drug therapy: Secondary | ICD-10-CM | POA: Insufficient documentation

## 2016-07-14 DIAGNOSIS — H209 Unspecified iridocyclitis: Secondary | ICD-10-CM | POA: Insufficient documentation

## 2016-07-14 MED ORDER — SODIUM CHLORIDE 0.9 % IV SOLN
3.0000 mg/kg | INTRAVENOUS | Status: DC
  Administered 2016-07-14: 200 mg via INTRAVENOUS
  Filled 2016-07-14: qty 20

## 2016-07-14 MED ORDER — DIPHENHYDRAMINE HCL 25 MG PO CAPS
25.0000 mg | ORAL_CAPSULE | ORAL | Status: DC
  Administered 2016-07-14: 25 mg via ORAL

## 2016-07-14 MED ORDER — ACETAMINOPHEN 325 MG PO TABS
ORAL_TABLET | ORAL | Status: AC
  Filled 2016-07-14: qty 2

## 2016-07-14 MED ORDER — SODIUM CHLORIDE 0.9 % IV SOLN
INTRAVENOUS | Status: DC
  Administered 2016-07-14 (×2): via INTRAVENOUS

## 2016-07-14 MED ORDER — DIPHENHYDRAMINE HCL 25 MG PO CAPS
ORAL_CAPSULE | ORAL | Status: AC
  Filled 2016-07-14: qty 1

## 2016-07-14 MED ORDER — ACETAMINOPHEN 325 MG PO TABS
650.0000 mg | ORAL_TABLET | ORAL | Status: DC
  Administered 2016-07-14: 650 mg via ORAL

## 2016-07-17 LAB — QUANTIFERON TB GOLD ASSAY (BLOOD)

## 2016-07-17 LAB — QUANTIFERON IN TUBE
QFT TB AG MINUS NIL VALUE: <0 IU/mL
QUANTIFERON MITOGEN VALUE: 9.32 [IU]/mL
QUANTIFERON NIL VALUE: 0.08 [IU]/mL
QUANTIFERON TB AG VALUE: 0.05 [IU]/mL
QUANTIFERON TB GOLD: NEGATIVE

## 2016-07-20 DIAGNOSIS — M542 Cervicalgia: Secondary | ICD-10-CM | POA: Diagnosis not present

## 2016-07-23 DIAGNOSIS — M542 Cervicalgia: Secondary | ICD-10-CM | POA: Diagnosis not present

## 2016-07-24 DIAGNOSIS — D869 Sarcoidosis, unspecified: Secondary | ICD-10-CM | POA: Diagnosis not present

## 2016-07-24 DIAGNOSIS — E559 Vitamin D deficiency, unspecified: Secondary | ICD-10-CM | POA: Diagnosis not present

## 2016-07-24 DIAGNOSIS — Z23 Encounter for immunization: Secondary | ICD-10-CM | POA: Diagnosis not present

## 2016-07-24 DIAGNOSIS — K219 Gastro-esophageal reflux disease without esophagitis: Secondary | ICD-10-CM | POA: Diagnosis not present

## 2016-07-24 DIAGNOSIS — R7303 Prediabetes: Secondary | ICD-10-CM | POA: Diagnosis not present

## 2016-07-24 DIAGNOSIS — I10 Essential (primary) hypertension: Secondary | ICD-10-CM | POA: Diagnosis not present

## 2016-07-24 DIAGNOSIS — E7800 Pure hypercholesterolemia, unspecified: Secondary | ICD-10-CM | POA: Diagnosis not present

## 2016-07-27 DIAGNOSIS — M542 Cervicalgia: Secondary | ICD-10-CM | POA: Diagnosis not present

## 2016-07-30 DIAGNOSIS — M542 Cervicalgia: Secondary | ICD-10-CM | POA: Diagnosis not present

## 2016-08-24 ENCOUNTER — Other Ambulatory Visit (HOSPITAL_COMMUNITY): Payer: Self-pay | Admitting: *Deleted

## 2016-08-25 ENCOUNTER — Ambulatory Visit (HOSPITAL_COMMUNITY)
Admission: RE | Admit: 2016-08-25 | Discharge: 2016-08-25 | Disposition: A | Discharge status: Home / Self Care | Payer: Medicare Other | Source: Ambulatory Visit | Attending: Rheumatology | Admitting: Rheumatology

## 2016-08-25 DIAGNOSIS — M542 Cervicalgia: Secondary | ICD-10-CM | POA: Diagnosis not present

## 2016-08-25 DIAGNOSIS — H209 Unspecified iridocyclitis: Secondary | ICD-10-CM | POA: Insufficient documentation

## 2016-08-25 LAB — CBC
HEMATOCRIT: 39.6 % (ref 36.0–46.0)
Hemoglobin: 13.6 g/dL (ref 12.0–15.0)
MCH: 30.5 pg (ref 26.0–34.0)
MCHC: 34.3 g/dL (ref 30.0–36.0)
MCV: 88.8 fL (ref 78.0–100.0)
PLATELETS: 314 10*3/uL (ref 150–400)
RBC: 4.46 MIL/uL (ref 3.87–5.11)
RDW: 13.6 % (ref 11.5–15.5)
WBC: 6.4 10*3/uL (ref 4.0–10.5)

## 2016-08-25 LAB — COMPREHENSIVE METABOLIC PANEL
ALT: 16 U/L (ref 14–54)
AST: 21 U/L (ref 15–41)
Albumin: 4.2 g/dL (ref 3.5–5.0)
Alkaline Phosphatase: 58 U/L (ref 38–126)
Anion gap: 5 (ref 5–15)
BILIRUBIN TOTAL: 0.7 mg/dL (ref 0.3–1.2)
BUN: 14 mg/dL (ref 6–20)
CHLORIDE: 111 mmol/L (ref 101–111)
CO2: 26 mmol/L (ref 22–32)
CREATININE: 0.95 mg/dL (ref 0.44–1.00)
Calcium: 9.6 mg/dL (ref 8.9–10.3)
Glucose, Bld: 120 mg/dL — ABNORMAL HIGH (ref 65–99)
Potassium: 4.2 mmol/L (ref 3.5–5.1)
Sodium: 142 mmol/L (ref 135–145)
TOTAL PROTEIN: 7 g/dL (ref 6.5–8.1)

## 2016-08-25 LAB — DIFFERENTIAL
BASOS ABS: 0 10*3/uL (ref 0.0–0.1)
BASOS PCT: 0 %
EOS ABS: 0.1 10*3/uL (ref 0.0–0.7)
EOS PCT: 2 %
LYMPHS ABS: 2.4 10*3/uL (ref 0.7–4.0)
Lymphocytes Relative: 38 %
Monocytes Absolute: 0.4 10*3/uL (ref 0.1–1.0)
Monocytes Relative: 6 %
NEUTROS PCT: 54 %
Neutro Abs: 3.5 10*3/uL (ref 1.7–7.7)

## 2016-08-25 MED ORDER — ACETAMINOPHEN 325 MG PO TABS
ORAL_TABLET | ORAL | Status: AC
  Filled 2016-08-25: qty 2

## 2016-08-25 MED ORDER — SODIUM CHLORIDE 0.9 % IV SOLN
3.0000 mg/kg | INTRAVENOUS | Status: AC
  Administered 2016-08-25: 200 mg via INTRAVENOUS
  Filled 2016-08-25: qty 20

## 2016-08-25 MED ORDER — SODIUM CHLORIDE 0.9 % IV SOLN
INTRAVENOUS | Status: DC
  Administered 2016-08-25: 12:00:00 via INTRAVENOUS

## 2016-08-25 MED ORDER — DIPHENHYDRAMINE HCL 25 MG PO CAPS
25.0000 mg | ORAL_CAPSULE | ORAL | Status: AC
  Administered 2016-08-25: 25 mg via ORAL

## 2016-08-25 MED ORDER — ACETAMINOPHEN 325 MG PO TABS
650.0000 mg | ORAL_TABLET | ORAL | Status: AC
  Administered 2016-08-25: 650 mg via ORAL

## 2016-08-25 MED ORDER — DIPHENHYDRAMINE HCL 25 MG PO CAPS
ORAL_CAPSULE | ORAL | Status: AC
  Filled 2016-08-25: qty 1

## 2016-08-26 ENCOUNTER — Telehealth: Payer: Self-pay | Admitting: Radiology

## 2016-08-26 NOTE — Telephone Encounter (Signed)
I have called patient to advise labs are normal  

## 2016-08-26 NOTE — Telephone Encounter (Signed)
-----   Message from Pollyann Savoy, MD sent at 08/25/2016  1:47 PM EST ----- Labs normal, no change in treatment

## 2016-08-28 DIAGNOSIS — M5416 Radiculopathy, lumbar region: Secondary | ICD-10-CM | POA: Diagnosis not present

## 2016-08-28 DIAGNOSIS — M542 Cervicalgia: Secondary | ICD-10-CM | POA: Diagnosis not present

## 2016-08-28 DIAGNOSIS — M961 Postlaminectomy syndrome, not elsewhere classified: Secondary | ICD-10-CM | POA: Diagnosis not present

## 2016-09-01 ENCOUNTER — Encounter (INDEPENDENT_AMBULATORY_CARE_PROVIDER_SITE_OTHER): Payer: Self-pay

## 2016-09-01 ENCOUNTER — Ambulatory Visit (INDEPENDENT_AMBULATORY_CARE_PROVIDER_SITE_OTHER): Payer: Medicare Other | Admitting: Cardiology

## 2016-09-01 ENCOUNTER — Encounter: Payer: Self-pay | Admitting: Cardiology

## 2016-09-01 VITALS — BP 120/72 | HR 62 | Ht 63.0 in | Wt 145.2 lb

## 2016-09-01 DIAGNOSIS — I471 Supraventricular tachycardia: Secondary | ICD-10-CM | POA: Diagnosis not present

## 2016-09-01 NOTE — Patient Instructions (Addendum)
Medication Instructions: - Your physician recommends that you continue on your current medications as directed. Please refer to the Current Medication list given to you today.  Labwork: - none ordered  Procedures/Testing: - none ordered  Follow-Up: - Your physician wants you to follow-up in: 6 months with Dr. Camnitz. You will receive a reminder letter in the mail two months in advance. If you don't receive a letter, please call our office to schedule the follow-up appointment.  Any Additional Special Instructions Will Be Listed Below (If Applicable).     If you need a refill on your cardiac medications before your next appointment, please call your pharmacy.   

## 2016-09-01 NOTE — Progress Notes (Signed)
Electrophysiology Office Note   Date:  09/01/2016   ID:  Renee Jones, DOB 10-14-1951, MRN [id number]  PCP:  Clayborn Heron, MD  Cardiologist:  Duke Salvia Primary Electrophysiologist:  Rosebud Koenen Jorja Loa, MD    Chief Complaint  Patient presents with  . Follow-up    SVT     History of Present Illness: Renee Jones is a 65 y.o. female who presents today for electrophysiology evaluation.   Referred for evaluation of SVT. Was started on metoprolol at the last visit for a long RP tachycardia.  Since that time, she is felt well without any major complaints. She says that she only has occasional episodes of palpitations, but no further extended periods.  Today, she denies symptoms of palpitations, chest pain, shortness of breath, orthopnea, PND, lower extremity edema, claudication, dizziness, presyncope, syncope, bleeding, or neurologic sequela. The patient is tolerating medications without difficulties and is otherwise without complaint today.    Past Medical History:  Diagnosis Date  . Allergy   . Asthma   . Diastolic dysfunction 08/28/2015  . Essential hypertension 08/28/2015  . GERD (gastroesophageal reflux disease)   . Hypertension   . Lumbar disc disease   . Neuromuscular disorder (HCC)   . Sarcoidosis (HCC)   . Sarcoidosis (HCC)    with +lymph node biopsy  . SVT (supraventricular tachycardia) (HCC) 08/28/2015  . Tachycardia   . Uveitis    Past Surgical History:  Procedure Laterality Date  . LUMBAR SPINE SURGERY    . ROTATOR CUFF REPAIR    . TOTAL ABDOMINAL HYSTERECTOMY       Current Outpatient Prescriptions  Medication Sig Dispense Refill  . acetaminophen (TYLENOL) 325 MG tablet Take 650 mg by mouth once. One hour prior to infusion    . albuterol (PROVENTIL HFA;VENTOLIN HFA) 108 (90 BASE) MCG/ACT inhaler Inhale 2 puffs into the lungs every 6 (six) hours as needed for wheezing or shortness of breath.    . diphenhydrAMINE (BENADRYL) 25 mg  capsule Take 25 mg by mouth once.    . fluticasone (FLONASE) 50 MCG/ACT nasal spray Place 2 sprays into both nostrils daily as needed for allergies or rhinitis.    . folic acid (FOLVITE) 1 MG tablet Take 2 mg by mouth daily.     Marland Kitchen inFLIXimab (REMICADE) 100 MG injection Inject 3 mg/kg into the vein every 6 (six) weeks.    . inFLIXimab in sodium chloride 0.9 % Inject 3 mg/kg into the vein every 6 (six) weeks. Remicade 3mg /kg infuse 200mg  (2 vials) over 2 hours every 6 weeks for uveitis    . losartan (COZAAR) 50 MG tablet Take 50 mg by mouth daily.  1  . ondansetron (ZOFRAN) 4 MG tablet Take 4 mg by mouth as needed for nausea or vomiting.    . pantoprazole (PROTONIX) 40 MG tablet Take 40 mg by mouth daily.  5  . Polyethyl Glycol-Propyl Glycol (SYSTANE OP) Apply to eye daily.    . tapentadol (NUCYNTA) 50 MG TABS tablet Take 50 mg by mouth 3 (three) times daily as needed.    . metoprolol (LOPRESSOR) 50 MG tablet Take 1 tablet (50 mg total) by mouth 2 (two) times daily. 180 tablet 3   No current facility-administered medications for this visit.     Allergies:   Codeine; Erythromycin; Sulfa antibiotics; and Tramadol   Social History:  The patient  reports that she has never smoked. She has never used smokeless tobacco. She reports that she does not drink alcohol  or use drugs.   Family History:  The patient's family history includes Scleroderma in her daughter. She was adopted.    ROS:  Please see the history of present illness.   Otherwise, review of systems is positive for back pain.   All other systems are reviewed and negative.    PHYSICAL EXAM: VS:  BP 120/72   Pulse 62   Ht 5\' 3"  (1.6 m)   Wt 145 lb 3.2 oz (65.9 kg)   BMI 25.72 kg/m  , BMI Body mass index is 25.72 kg/m. GEN: Well nourished, well developed, in no acute distress  HEENT: normal  Neck: no JVD, carotid bruits, or masses Cardiac: RRR; no murmurs, rubs, or gallops,no edema  Respiratory:  clear to auscultation bilaterally,  normal work of breathing GI: soft, nontender, nondistended, + BS MS: no deformity or atrophy  Skin: warm and dry Neuro:  Strength and sensation are intact Psych: euthymic mood, full affect  EKG:  EKG is not ordered today. Personal review of the ekg ordered 7/17/17shows sinus rhythm, inferior TWI  Recent Labs: 08/25/2016: ALT 16; BUN 14; Creatinine, Ser 0.95; Hemoglobin 13.6; Platelets 314; Potassium 4.2; Sodium 142    Lipid Panel  No results found for: CHOL, TRIG, HDL, CHOLHDL, VLDL, LDLCALC, LDLDIRECT   Wt Readings from Last 3 Encounters:  09/01/16 145 lb 3.2 oz (65.9 kg)  08/25/16 140 lb (63.5 kg)  07/14/16 139 lb (63 kg)      Other studies Reviewed: Additional studies/ records that were reviewed today include: TTE 09/03/15  Review of the above records today demonstrates:  - Normal LV systolic function; grade 1 diastolic dysfunction; mild   MR.  2014 CMRI 1. Normal LV size and systolic function, EF 69%.  2. Normal RV size and systolic function.  3. No myocardial delayed enhancement, so no definitive evidence for prior MI, infiltrative disease, or myocarditis.  4. No evidence on this exam for cardiac sarcoidosis.  ASSESSMENT AND PLAN:  1.  SVT: No longer having symptoms from her long RP tachycardia on her metoprolol at the current dose. I have told her that if she has any further symptoms that last extended period of time, that she can take an extra half dose of metoprolol.  2. Hypertension: Currently elevated, Yuri Flener increase metoprolol.  3. Diastolic dysfunction: Grade one, metoprolol recently increased   Current medicines are reviewed at length with the patient today.   The patient does not have concerns regarding her medicines.  The following changes were made today:  none  Labs/ tests ordered today include:  No orders of the defined types were placed in this encounter.    Disposition:   FU with Ifeoluwa Bartz 6 months  Signed, Leatha Rohner 09/05/15, MD    09/01/2016 2:39 PM     [facility] [address] [place] [phone] (office) [phone] (fax)

## 2016-09-14 ENCOUNTER — Encounter: Payer: Self-pay | Admitting: *Deleted

## 2016-09-14 DIAGNOSIS — M170 Bilateral primary osteoarthritis of knee: Secondary | ICD-10-CM | POA: Insufficient documentation

## 2016-09-14 DIAGNOSIS — J9801 Acute bronchospasm: Secondary | ICD-10-CM

## 2016-09-14 DIAGNOSIS — M5136 Other intervertebral disc degeneration, lumbar region: Secondary | ICD-10-CM

## 2016-09-14 DIAGNOSIS — K219 Gastro-esophageal reflux disease without esophagitis: Secondary | ICD-10-CM | POA: Insufficient documentation

## 2016-09-14 DIAGNOSIS — M19041 Primary osteoarthritis, right hand: Secondary | ICD-10-CM

## 2016-09-14 DIAGNOSIS — M19042 Primary osteoarthritis, left hand: Secondary | ICD-10-CM | POA: Insufficient documentation

## 2016-09-14 DIAGNOSIS — M51369 Other intervertebral disc degeneration, lumbar region without mention of lumbar back pain or lower extremity pain: Secondary | ICD-10-CM | POA: Insufficient documentation

## 2016-09-14 DIAGNOSIS — H3320 Serous retinal detachment, unspecified eye: Secondary | ICD-10-CM

## 2016-09-14 DIAGNOSIS — H409 Unspecified glaucoma: Secondary | ICD-10-CM

## 2016-09-14 HISTORY — DX: Acute bronchospasm: J98.01

## 2016-09-14 HISTORY — DX: Other intervertebral disc degeneration, lumbar region: M51.36

## 2016-09-14 HISTORY — DX: Unspecified glaucoma: H40.9

## 2016-09-14 HISTORY — DX: Primary osteoarthritis, left hand: M19.042

## 2016-09-14 HISTORY — DX: Bilateral primary osteoarthritis of knee: M17.0

## 2016-09-14 HISTORY — DX: Primary osteoarthritis, right hand: M19.041

## 2016-09-14 HISTORY — DX: Serous retinal detachment, unspecified eye: H33.20

## 2016-09-14 HISTORY — DX: Other intervertebral disc degeneration, lumbar region without mention of lumbar back pain or lower extremity pain: M51.369

## 2016-09-15 ENCOUNTER — Encounter: Payer: Self-pay | Admitting: Rheumatology

## 2016-09-15 ENCOUNTER — Ambulatory Visit (INDEPENDENT_AMBULATORY_CARE_PROVIDER_SITE_OTHER): Payer: Medicare Other | Admitting: Rheumatology

## 2016-09-15 VITALS — BP 140/71 | HR 80 | Resp 14 | Ht 63.0 in | Wt 143.0 lb

## 2016-09-15 DIAGNOSIS — D869 Sarcoidosis, unspecified: Secondary | ICD-10-CM | POA: Diagnosis not present

## 2016-09-15 DIAGNOSIS — Z79899 Other long term (current) drug therapy: Secondary | ICD-10-CM

## 2016-09-15 DIAGNOSIS — D8686 Sarcoid arthropathy: Secondary | ICD-10-CM | POA: Diagnosis not present

## 2016-09-15 DIAGNOSIS — H209 Unspecified iridocyclitis: Secondary | ICD-10-CM | POA: Diagnosis not present

## 2016-09-15 MED ORDER — DICLOFENAC SODIUM 1 % TD GEL: TRANSDERMAL | 3 refills | Status: DC

## 2016-09-15 NOTE — Progress Notes (Signed)
Office Visit Note  Patient: Renee Jones             Date of Birth: 1951/02/03           MRN: [id number]             PCP: Clayborn Heron, MD Referring: Clayborn Heron, MD Visit Date: 09/15/2016 Occupation: @GUAROCC @    Subjective:  No chief complaint on file. Follow up on sarcoidosis, uveitis, high risk prescription.  History of Present Illness: Renee Jones is a 65 y.o. female  Last seen 04/28/2016 for sarcoidosis. She is doing really well with the sarcoidosis. No shortness of breath.  She has a history of uveitis and according to the patient it has flared around June 2017 for about 2 days when she was having blurred vision. She has an appointment to see her eye doctor about January 2018. Currently her vision is all normal.  She does take Remicade at 3 mg/kg every 6 weeks at [facility] and getting adequate response Also taking methotrexate 10 pills every week on Sundays and doing well with that. Taking folic acid 2 pills every day Takes pretreatment with Benadryl Tylenol and Zyrtec.  Currently having some pain with change of weather to her hands.  Also gets labs with infusions   She starting to have some knee discomfort and once Voltaren gel refill for that. She has a history of OA of the knee joint and we gave her Hyalgan 5 both knees finished a treatment 05/29/2015 and patient is doing relatively well with currently starting to have some knee discomfort. She states that she probably would benefit from repeat of the and injections and I told her we will apply for the medication late January early February. Patient is agreeable. She will be 78 and she will coordinate this with her insurance company to be sure that her planned pace 4 and she is able to afford it.  Activities of Daily Living:  Patient reports morning stiffness for 30 minutes.   Patient Denies nocturnal pain.  Difficulty dressing/grooming: Denies Difficulty climbing  stairs: Denies Difficulty getting out of chair: Denies Difficulty using hands for taps, buttons, cutlery, and/or writing: Denies   Review of Systems  Constitutional: Negative for fatigue.  HENT: Negative for mouth sores and mouth dryness.   Eyes: Negative for dryness.  Respiratory: Negative for shortness of breath.   Gastrointestinal: Negative for constipation and diarrhea.  Musculoskeletal: Negative for myalgias and myalgias.  Skin: Negative for sensitivity to sunlight.  Psychiatric/Behavioral: Negative for decreased concentration and sleep disturbance.    PMFS History:  Patient Active Problem List   Diagnosis Date Noted  . GERD (gastroesophageal reflux disease) 09/14/2016  . Bronchial spasm 09/14/2016  . Glaucoma 09/14/2016  . Detached retina 09/14/2016  . Osteoarthritis of both hands 09/14/2016  . Osteoarthritis of both knees 09/14/2016  . DDD (degenerative disc disease), lumbar 09/14/2016  . Uveitis 06/22/2016  . SVT (supraventricular tachycardia) (HCC) 08/28/2015  . Essential hypertension 08/28/2015  . Diastolic dysfunction 08/28/2015  . Dyspnea and respiratory abnormality 04/23/2015  . Sarcoidosis (HCC) 09/30/2012  . Pulmonary nodules 09/30/2012  . Tachycardia 09/30/2012  . Sarcoid arthropathy (HCC) 09/30/2012    Past Medical History:  Diagnosis Date  . Allergy   . Asthma   . Bronchial spasm 09/14/2016  . DDD (degenerative disc disease), lumbar 09/14/2016  . Detached retina 09/14/2016  . Diastolic dysfunction 08/28/2015  . Essential hypertension 08/28/2015  . GERD (gastroesophageal reflux disease)   .  Glaucoma 09/14/2016  . Hypertension   . Lumbar disc disease   . Neuromuscular disorder (HCC)   . Osteoarthritis of both hands 09/14/2016  . Osteoarthritis of both knees 09/14/2016  . Sarcoidosis (HCC)   . Sarcoidosis (HCC)    with +lymph node biopsy  . SVT (supraventricular tachycardia) (HCC) 08/28/2015  . Tachycardia   . Uveitis     Family History  Problem  Relation Age of Onset  . Adopted: Yes  . Scleroderma Daughter    Past Surgical History:  Procedure Laterality Date  . CYSTECTOMY    . LUMBAR SPINE SURGERY    . ROTATOR CUFF REPAIR    . TOTAL ABDOMINAL HYSTERECTOMY     Social History   Social History Narrative  . No narrative on file     Objective: Vital Signs: BP 140/71 (BP Location: Left Arm, Patient Position: Sitting, Cuff Size: Large)   Pulse 80   Resp 14   Ht 5\' 3"  (1.6 m)   Wt 143 lb (64.9 kg)   BMI 25.33 kg/m    Physical Exam  Constitutional: She is oriented to person, place, and time. She appears well-developed and well-nourished.  HENT:  Head: Normocephalic and atraumatic.  Eyes: EOM are normal. Pupils are equal, round, and reactive to light.  Cardiovascular: Normal rate, regular rhythm and normal heart sounds.  Exam reveals no gallop and no friction rub.   No murmur heard. Pulmonary/Chest: Effort normal and breath sounds normal. She has no wheezes. She has no rales.  Abdominal: Soft. Bowel sounds are normal. She exhibits no distension. There is no tenderness. There is no guarding. No hernia.  Musculoskeletal: Normal range of motion. She exhibits no edema, tenderness or deformity.  Lymphadenopathy:    She has no cervical adenopathy.  Neurological: She is alert and oriented to person, place, and time. Coordination normal.  Skin: Skin is warm and dry. Capillary refill takes less than 2 seconds. No rash noted.  Psychiatric: She has a normal mood and affect. Her behavior is normal.     Musculoskeletal Exam:  Full range of motion of all joints Grip strength is equal and strong bilaterally Fibromyalgia tender points are all absent  CDAI Exam: CDAI Homunculus Exam:   Joint Counts:  CDAI Tender Joint count: 0 CDAI Swollen Joint count: 0  Global Assessments:  Patient Global Assessment: 3 Provider Global Assessment: 3  CDAI Calculated Score: 6  No synovitis on exam  Investigation: No additional  findings. TB gold done 07/14/2016 was negative at [facility]. Note the patient gets Remicade infusions and she gets her TB gold as well as her labs on at the appropriate times when she gets her infusions every 6 weeks or so.  Imaging: No results found.  Speciality Comments: No specialty comments available.    Procedures:  No procedures performed Allergies: Codeine; Erythromycin; Sulfa antibiotics; and Tramadol   Assessment / Plan:     Visit Diagnoses: Sarcoid arthropathy (HCC)  Uveitis  Sarcoidosis (HCC)  High risk medications (not anticoagulants) long-term use   Patient will get heated glass from [email or web address] because she has more pain when the weather changes Sure he got copper gloves but they're not effective at all  Continue Remicade and methotrexate. Patient is doing well with that. She does not need a refill at this time.  Preauthorization for Hyalgan 5 bilateral knees in jANUARY/February and started injections in February/March of possible.  Labs done with every infusion. TB goal is negative as of September October 2017  Orders: No orders of the defined types were placed in this encounter.  Meds ordered this encounter  Medications  . diclofenac sodium (VOLTAREN) 1 % GEL    Sig: Voltaren Gel 3 grams to 3 large joints upto TID 3 TUBES with 3 refills    Dispense:  3 Tube    Refill:  3    Order Specific Question:   Supervising Provider    Answer:   Vanessa Kick    Face-to-face time spent with patient was 30 minutes. 50% of time was spent in counseling and coordination of care.  Follow-Up Instructions: Return in about 4 months (around 01/13/2017) for sarcoidosis, remicade, mtx, uveitis, oakj.   Tawni Pummel, PA-C   I examined and evaluated the patient with Tawni Pummel PA. The plan of care was discussed as noted above.  Pollyann Savoy, MD

## 2016-09-18 ENCOUNTER — Telehealth: Payer: Self-pay | Admitting: Radiology

## 2016-09-18 NOTE — Telephone Encounter (Signed)
[facility] 

## 2016-09-18 NOTE — Telephone Encounter (Signed)
Have completed PA request via cover my meds  

## 2016-09-18 NOTE — Telephone Encounter (Signed)
PA request received via fax for Voltaren gel  [phone]

## 2016-09-25 DIAGNOSIS — M961 Postlaminectomy syndrome, not elsewhere classified: Secondary | ICD-10-CM | POA: Diagnosis not present

## 2016-09-25 DIAGNOSIS — M5416 Radiculopathy, lumbar region: Secondary | ICD-10-CM | POA: Diagnosis not present

## 2016-09-25 DIAGNOSIS — G894 Chronic pain syndrome: Secondary | ICD-10-CM | POA: Diagnosis not present

## 2016-10-02 ENCOUNTER — Other Ambulatory Visit: Payer: Self-pay | Admitting: Rheumatology

## 2016-10-02 NOTE — Telephone Encounter (Signed)
Patient needs a refill of MTX and Folic acid sent to [facility] [address] in [place]

## 2016-10-02 NOTE — Telephone Encounter (Signed)
Last Visit: 09/15/16 Next Visit: 01/14/16 Labs: 08/25/16 WNL  Okay to refill MTX and Folic acid?

## 2016-10-04 ENCOUNTER — Other Ambulatory Visit: Payer: Self-pay | Admitting: Rheumatology

## 2016-10-05 ENCOUNTER — Telehealth: Payer: Self-pay | Admitting: Rheumatology

## 2016-10-05 ENCOUNTER — Other Ambulatory Visit: Payer: Self-pay | Admitting: Radiology

## 2016-10-05 DIAGNOSIS — H209 Unspecified iridocyclitis: Secondary | ICD-10-CM

## 2016-10-05 MED ORDER — METHOTREXATE 2.5 MG PO TABS: 25.0000 mg | ORAL_TABLET | ORAL | 0 refills | Status: DC

## 2016-10-05 MED ORDER — FOLIC ACID 1 MG PO TABS: 2.0000 mg | ORAL_TABLET | Freq: Every day | ORAL | 3 refills | Status: DC

## 2016-10-05 NOTE — Progress Notes (Signed)
09/15/16 last visit Next visit 01/13/17 TB 07/15/15

## 2016-10-05 NOTE — Telephone Encounter (Signed)
Orders placed in Epic by Amy L.

## 2016-10-05 NOTE — Telephone Encounter (Signed)
Renee Jones w/[facility] cb# [phone] needs [employer] infusion orders put in for tomorrows appointment

## 2016-10-06 ENCOUNTER — Ambulatory Visit (HOSPITAL_COMMUNITY)
Admission: RE | Admit: 2016-10-06 | Discharge: 2016-10-06 | Disposition: A | Discharge status: Home / Self Care | Payer: Medicare Other | Source: Ambulatory Visit | Attending: Rheumatology | Admitting: Rheumatology

## 2016-10-06 DIAGNOSIS — H209 Unspecified iridocyclitis: Secondary | ICD-10-CM | POA: Insufficient documentation

## 2016-10-06 MED ORDER — ACETAMINOPHEN 325 MG PO TABS
650.0000 mg | ORAL_TABLET | Freq: Once | ORAL | Status: AC
  Administered 2016-10-06: 650 mg via ORAL

## 2016-10-06 MED ORDER — DIPHENHYDRAMINE HCL 25 MG PO CAPS
25.0000 mg | ORAL_CAPSULE | Freq: Once | ORAL | Status: AC
  Administered 2016-10-06: 25 mg via ORAL

## 2016-10-06 MED ORDER — SODIUM CHLORIDE 0.9 % IV SOLN
3.0000 mg/kg | INTRAVENOUS | Status: DC
  Administered 2016-10-06: 200 mg via INTRAVENOUS
  Filled 2016-10-06: qty 10

## 2016-10-06 MED ORDER — ACETAMINOPHEN 325 MG PO TABS
ORAL_TABLET | ORAL | Status: AC
  Administered 2016-10-06: 650 mg via ORAL
  Filled 2016-10-06: qty 2

## 2016-10-06 MED ORDER — DIPHENHYDRAMINE HCL 25 MG PO CAPS
ORAL_CAPSULE | ORAL | Status: AC
  Administered 2016-10-06: 25 mg via ORAL
  Filled 2016-10-06: qty 1

## 2016-10-19 HISTORY — PX: EYE SURGERY: SHX253

## 2016-10-19 HISTORY — PX: CATARACT EXTRACTION, BILATERAL: SHX1313

## 2016-10-26 DIAGNOSIS — J019 Acute sinusitis, unspecified: Secondary | ICD-10-CM | POA: Diagnosis not present

## 2016-11-16 ENCOUNTER — Ambulatory Visit: Payer: Medicare Other | Admitting: Cardiovascular Disease

## 2016-11-16 ENCOUNTER — Other Ambulatory Visit: Payer: Self-pay | Admitting: *Deleted

## 2016-11-16 ENCOUNTER — Other Ambulatory Visit (HOSPITAL_COMMUNITY): Payer: Self-pay | Admitting: *Deleted

## 2016-11-16 ENCOUNTER — Telehealth: Payer: Self-pay | Admitting: Rheumatology

## 2016-11-16 DIAGNOSIS — H209 Unspecified iridocyclitis: Secondary | ICD-10-CM

## 2016-11-16 NOTE — Telephone Encounter (Signed)
Renee w/[facility], [phone] states patient is having remicaid infusion tomorrow and there isn't any orders in the system.

## 2016-11-16 NOTE — Telephone Encounter (Signed)
Orders placed in the computer. 

## 2016-11-16 NOTE — Progress Notes (Deleted)
Cardiology Office Note   Date:  11/16/2016   ID:  Kirklyn Sun, DOB 04/26/1951, MRN [id number]  PCP:  Clayborn Heron, MD  Cardiologist:   Chilton Si, MD   No chief complaint on file.   Patient ID:   Renee Jones is a 66 y.o. female paroxysmal SVT, HTN and sarcoidosis.    History of Present Illness: Renee Jones reports episodes of SVT that have been occurring for as long as she can remember.  She previously took a beta blocker which helped her symptoms but stopped taking it 20 years ago because she didn't think she needed it anymore.  Renee Jones was previously a patient of Dr. Michaelle Copas and had a Holter monitor documenting SVT.  She remembers him telling her to use Valsalva maneuvers when these episodes occur.  Renee Jones was evaluated in the ED 02/2015 with elevated BP to 197/120.  She received IV medication with improvement.  She followed up with her PCP, Dr. Barbaraann Barthel, who switched Olmesartan 20mg   to Losartan 50mg .  Since then her BP has been better controlled.  Renee Jones had a cardiac MRI 10/2012 that did not reveal any delayed enhancement concerning for cardiac involvement of her sarcoid.  She underwent CPX testing to evaluate shortness of breath and it was suggestive of diastolic dysfunction.  She was started on metoprolol for SVT and it also helped her shortness of breath.  She was referred for an echo 08/2015 that revealed LVEF 55-60% with grade 1 diastolic dysfunction.  Since her last appointment her husband died. He had struggled with metastatic prostate cancer.  She had a hard time living alone after this happened so she moved in with her daughter and granddaughter.  She continues to Have episodes of SVT approximately 3-4 times per week. When she does have a mid happens 2 or 3 times per day. Each episode lasts for several minutes.  She had an episode of SVT while getting her Remicaid infusion.  The staff was alarmed because her heart rate sustained at 1 24 bpm. She  was able to get it to stop with vagal maneuvers and deep breathing.   After these episodes she feels extremely exhausted and short of breath.  She denies chest pain, lower extremity edea, orthopnea or PND.   Since her last appointment Renee Jones has followed up with Dr. Elberta Fortis.  Her metoprolol was increased to 50 mg bid..   Past Medical History:  Diagnosis Date  . Allergy   . Asthma   . Bronchial spasm 09/14/2016  . DDD (degenerative disc disease), lumbar 09/14/2016  . Detached retina 09/14/2016  . Diastolic dysfunction 08/28/2015  . Essential hypertension 08/28/2015  . GERD (gastroesophageal reflux disease)   . Glaucoma 09/14/2016  . Hypertension   . Lumbar disc disease   . Neuromuscular disorder (HCC)   . Osteoarthritis of both hands 09/14/2016  . Osteoarthritis of both knees 09/14/2016  . Sarcoidosis (HCC)   . Sarcoidosis (HCC)    with +lymph node biopsy  . SVT (supraventricular tachycardia) (HCC) 08/28/2015  . Tachycardia   . Uveitis     Past Surgical History:  Procedure Laterality Date  . CYSTECTOMY    . LUMBAR SPINE SURGERY    . ROTATOR CUFF REPAIR    . TOTAL ABDOMINAL HYSTERECTOMY       Current Outpatient Prescriptions  Medication Sig Dispense Refill  . albuterol (PROVENTIL HFA;VENTOLIN HFA) 108 (90 BASE) MCG/ACT inhaler Inhale 2 puffs into the lungs every 6 (  six) hours as needed for wheezing or shortness of breath.    . cholecalciferol (VITAMIN D) 1000 units tablet Take 1,000 Units by mouth daily.    . diclofenac (VOLTAREN) 75 MG EC tablet Take 75 mg by mouth 2 (two) times daily.    . diclofenac sodium (VOLTAREN) 1 % GEL Voltaren Gel 3 grams to 3 large joints upto TID 3 TUBES with 3 refills 3 Tube 3  . fluticasone (FLONASE) 50 MCG/ACT nasal spray Place 2 sprays into both nostrils daily as needed for allergies or rhinitis.    . folic acid (FOLVITE) 1 MG tablet Take 2 tablets (2 mg total) by mouth daily. 180 tablet 3  . losartan (COZAAR) 50 MG tablet Take 50 mg  by mouth daily.  1  . methotrexate (RHEUMATREX) 2.5 MG tablet Take 10 tablets (25 mg total) by mouth once a week. Caution:Chemotherapy. Protect from light. 120 tablet 0  . metoprolol (LOPRESSOR) 50 MG tablet Take 1 tablet (50 mg total) by mouth 2 (two) times daily. 180 tablet 3  . Multiple Vitamin (MULTIVITAMIN WITH MINERALS) TABS tablet Take 1 tablet by mouth daily.    . naproxen sodium (ANAPROX) 220 MG tablet Take 220 mg by mouth 2 (two) times daily with a meal.    . olmesartan (BENICAR) 20 MG tablet Take 20 mg by mouth daily.    . ondansetron (ZOFRAN) 4 MG tablet Take 4 mg by mouth as needed for nausea or vomiting.    . pantoprazole (PROTONIX) 40 MG tablet Take 40 mg by mouth daily.  5  . Polyethyl Glycol-Propyl Glycol (SYSTANE OP) Apply to eye daily.    . tapentadol (NUCYNTA) 50 MG TABS tablet Take 50 mg by mouth 3 (three) times daily as needed.     No current facility-administered medications for this visit.     Allergies:   Codeine; Erythromycin; Sulfa antibiotics; and Tramadol    Social History:  The patient  reports that she has never smoked. She has never used smokeless tobacco. She reports that she does not drink alcohol or use drugs.   Family History:  The patient's family history includes Scleroderma in her daughter. She was adopted.    ROS:  Please see the history of present illness.   Otherwise, review of systems are positive for none.   All other systems are reviewed and negative.    PHYSICAL EXAM: VS:  There were no vitals taken for this visit. , BMI There is no height or weight on file to calculate BMI. GENERAL:  Well appearing HEENT:  Pupils equal round and reactive, fundi not visualized, oral mucosa unremarkable NECK:  No jugular venous distention, waveform within normal limits, carotid upstroke brisk and symmetric, no bruits, no thyromegaly LYMPHATICS:  No cervical adenopathy LUNGS:  Clear to auscultation bilaterally CHEST:  Unremarkable HEART:  PMI not displaced  or sustained,S1 and S2 within normal limits, no S3, no S4, no clicks, no rubs, no murmurs ABD:  Flat, positive bowel sounds normal in frequency in pitch, no bruits, no rebound, no guarding, no midline pulsatile mass, no hepatomegaly, no splenomegaly EXT:  2 plus pulses throughout, no edema, no cyanosis no clubbing SKIN:  No rashes no nodules NEURO:  Cranial nerves II through XII grossly intact, motor grossly intact throughout PSYCH:  Cognitively intact, oriented to person place and time  EKG:  EKG is ordered today.  05/04/16:Sinus rhythm.  Rate 74 bpm.  Inferior TWI.   Recent Labs: 08/25/2016: ALT 16; BUN 14; Creatinine, Ser 0.95;  Hemoglobin 13.6; Platelets 314; Potassium 4.2; Sodium 142    Lipid Panel No results found for: CHOL, TRIG, HDL, CHOLHDL, VLDL, LDLCALC, LDLDIRECT    Wt Readings from Last 3 Encounters:  10/06/16 63.5 kg (140 lb)  09/15/16 64.9 kg (143 lb)  04/28/16 63.5 kg (140 lb)      Other studies Reviewed: Additional studies/ records that were reviewed today include: PCP note  Review of the above records demonstrates:  Please see elsewhere in the note.     CPX 05/01/15: Conclusion: Exercise testing with gas-exchange demonstrates a mild functional impairment when compared to matched sedentary norms. There is no significant evidence of EIB. The combination of reduced PVO2, hypertensive BP, elevated VE/VCO2 slope, and flat O2 pulse suggests possible circulatory limitations (specifically diastolic dysfunction).   2014 CMRI 1. Normal LV size and systolic function, EF 69%.  2. Normal RV size and systolic function.  3. No myocardial delayed enhancement, so no definitive evidence for prior MI, infiltrative disease, or myocarditis.  4. No evidence on this exam for cardiac sarcoidosis.   ASSESSMENT AND PLAN:  # SVT: Renee Jones continues to have frequent episodes of SVT on metoprolol.   We will increase metoprolol to 25 mg twice daily.  We will also refer her to  electrophysiology, and she would like to know more about catheter ablation.   # Diastolic dysfunction: CPX was suggestive of diastolic dysfunction and her symptoms have improved with metoprolol.  However, her echo only showed grade 1 diastolic function and she does not clinically appear to have heart failure. We will increase her metoprolol as above.  # Hypertension: BP well-controlled.  Continue losartan and increase metoprolol as above.   # Sarcoidosis: No evidence of cardiac involvement on MRI 10/2012.   Current medicines are reviewed at length with the patient today.  The patient does not have concerns regarding medicines.  The following changes have been made:  Increase metoprolol to 25 mg q12h  Labs/ tests ordered today include: none.  No orders of the defined types were placed in this encounter.    Disposition:   FU with Dr. Elmarie Shiley C. [place] in 3 months    Signed, Chilton Si, MD  11/16/2016 6:02 AM     [facility]

## 2016-11-17 ENCOUNTER — Telehealth: Payer: Self-pay | Admitting: Radiology

## 2016-11-17 ENCOUNTER — Ambulatory Visit (HOSPITAL_COMMUNITY)
Admission: RE | Admit: 2016-11-17 | Discharge: 2016-11-17 | Disposition: A | Discharge status: Home / Self Care | Payer: Medicare Other | Source: Ambulatory Visit | Attending: Rheumatology | Admitting: Rheumatology

## 2016-11-17 DIAGNOSIS — H209 Unspecified iridocyclitis: Secondary | ICD-10-CM | POA: Diagnosis not present

## 2016-11-17 LAB — COMPREHENSIVE METABOLIC PANEL
ALT: 22 U/L (ref 14–54)
AST: 27 U/L (ref 15–41)
Albumin: 3.9 g/dL (ref 3.5–5.0)
Alkaline Phosphatase: 61 U/L (ref 38–126)
Anion gap: 5 (ref 5–15)
BUN: 13 mg/dL (ref 6–20)
CHLORIDE: 107 mmol/L (ref 101–111)
CO2: 26 mmol/L (ref 22–32)
Calcium: 8.9 mg/dL (ref 8.9–10.3)
Creatinine, Ser: 0.92 mg/dL (ref 0.44–1.00)
Glucose, Bld: 182 mg/dL — ABNORMAL HIGH (ref 65–99)
Potassium: 4.2 mmol/L (ref 3.5–5.1)
Sodium: 138 mmol/L (ref 135–145)
Total Bilirubin: 0.8 mg/dL (ref 0.3–1.2)
Total Protein: 6.4 g/dL — ABNORMAL LOW (ref 6.5–8.1)

## 2016-11-17 LAB — CBC
HEMATOCRIT: 38 % (ref 36.0–46.0)
HEMOGLOBIN: 12.7 g/dL (ref 12.0–15.0)
MCH: 30.1 pg (ref 26.0–34.0)
MCHC: 33.4 g/dL (ref 30.0–36.0)
MCV: 90 fL (ref 78.0–100.0)
Platelets: 269 10*3/uL (ref 150–400)
RBC: 4.22 MIL/uL (ref 3.87–5.11)
RDW: 14.6 % (ref 11.5–15.5)
WBC: 5.8 10*3/uL (ref 4.0–10.5)

## 2016-11-17 MED ORDER — ACETAMINOPHEN 325 MG PO TABS
650.0000 mg | ORAL_TABLET | ORAL | Status: DC
  Administered 2016-11-17: 650 mg via ORAL

## 2016-11-17 MED ORDER — SODIUM CHLORIDE 0.9 % IV SOLN
3.0000 mg/kg | INTRAVENOUS | Status: DC
  Administered 2016-11-17: 200 mg via INTRAVENOUS
  Filled 2016-11-17: qty 20

## 2016-11-17 MED ORDER — DIPHENHYDRAMINE HCL 25 MG PO CAPS
25.0000 mg | ORAL_CAPSULE | ORAL | Status: DC
  Administered 2016-11-17: 25 mg via ORAL

## 2016-11-17 MED ORDER — ACETAMINOPHEN 325 MG PO TABS
ORAL_TABLET | ORAL | Status: AC
  Filled 2016-11-17: qty 2

## 2016-11-17 MED ORDER — DIPHENHYDRAMINE HCL 25 MG PO CAPS
ORAL_CAPSULE | ORAL | Status: AC
  Filled 2016-11-17: qty 1

## 2016-11-17 NOTE — Telephone Encounter (Signed)
I have called patient to advise of lab results. Left message asked her to call back if she wants to discuss or needs the number.

## 2016-11-17 NOTE — Telephone Encounter (Signed)
-----   Message from Pollyann Savoy, MD sent at 11/17/2016  1:13 PM EST ----- Glucose high, rest of the labs are normal

## 2016-11-17 NOTE — Progress Notes (Signed)
Glucose high, rest of the labs are normal

## 2016-11-18 ENCOUNTER — Encounter: Payer: Self-pay | Admitting: Cardiovascular Disease

## 2016-11-18 ENCOUNTER — Ambulatory Visit (INDEPENDENT_AMBULATORY_CARE_PROVIDER_SITE_OTHER): Payer: Medicare Other | Admitting: Cardiovascular Disease

## 2016-11-18 VITALS — BP 149/82 | HR 72 | Ht 63.0 in | Wt 141.8 lb

## 2016-11-18 DIAGNOSIS — R0789 Other chest pain: Secondary | ICD-10-CM | POA: Diagnosis not present

## 2016-11-18 DIAGNOSIS — I471 Supraventricular tachycardia: Secondary | ICD-10-CM

## 2016-11-18 DIAGNOSIS — I10 Essential (primary) hypertension: Secondary | ICD-10-CM

## 2016-11-18 MED ORDER — LOSARTAN POTASSIUM 25 MG PO TABS: ORAL_TABLET | ORAL | 5 refills | Status: DC

## 2016-11-18 NOTE — Patient Instructions (Signed)
Medication Instructions:  INCREASE YOUR LOSARTAN TO 75 MG DAILY (1 50 MG AND 1 25 MG DAILY)  Labwork: NONE  Testing/Procedures: NONE  Follow-Up: Your physician recommends that you schedule a follow-up appointment in: 1 MONTH OV  If you need a refill on your cardiac medications before your next appointment, please call your pharmacy.

## 2016-11-18 NOTE — Progress Notes (Signed)
Cardiology Office Note   Date:  11/20/2016   ID:  Renee Jones, DOB March 24, 1951, MRN [id number]  PCP:  Clayborn Heron, MD  Cardiologist:   Chilton Si, MD   Chief Complaint  Patient presents with  . Chest Pain    chest pain going into back.    Patient ID:   Renee Jones is a 66 y.o. female paroxysmal SVT, HTN and sarcoidosis.    History of Present Illness: Renee Jones reports episodes of SVT that have been occurring for as long as she can remember.  She previously took a beta blocker which helped her symptoms but stopped taking it 20 years ago because she didn't think she needed it anymore.  Renee Jones was previously a patient of Dr. Michaelle Copas and had a Holter monitor documenting SVT.  Renee Jones was evaluated in the ED 02/2015 with elevated BP to 197/120.  She received IV medication with improvement.  Her ARB was chang in her blood pressure has been well controlled since that time.ed   Renee Jones had a cardiac MRI 10/2012 that did not reveal any delayed enhancement concerning for cardiac involvement of her sarcoid.  She underwent CPX testing to evaluate shortness of breath and it was suggestive of diastolic dysfunction.  She was started on metoprolol for SVT and it also helped her shortness of breath.  She was referred for an echo 08/2015 that revealed LVEF 55-60% with grade 1 diastolic dysfunction.  She is a recurrent episode of SVT while getting a R infusion. She is able to stop it with vagal maneuvers.   Since her last appointment Renee Jones has followed up with Dr. Elberta Fortis.  Her metoprolol was increased to 50 mg bid. She thinks that this has helped.  Occasionally she feels tightness and spasm in her chest.  This occurs infrequently and last time it lasted for two days.  The symptoms occurred intermittently for two days.  It occurs both at rest or with exertion. It was worse when she laid down and radiated into her back. There is no nausea, shortness of breath, or  diaphoresis. She does report a history of gastroesophageal reflux disease.   Renee Jones's husband died last year.  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 She will report to joining a gym soon.   Past Medical History:  Diagnosis Date  . Allergy   . Asthma   . Bronchial spasm 09/14/2016  . DDD (degenerative disc disease), lumbar 09/14/2016  . Detached retina 09/14/2016  . Diastolic dysfunction 08/28/2015  . Essential hypertension 08/28/2015  . GERD (gastroesophageal reflux disease)   . Glaucoma 09/14/2016  . Hypertension   . Lumbar disc disease   . Neuromuscular disorder (HCC)   . Osteoarthritis of both hands 09/14/2016  . Osteoarthritis of both knees 09/14/2016  . Sarcoidosis (HCC)   . Sarcoidosis (HCC)    with +lymph node biopsy  . SVT (supraventricular tachycardia) (HCC) 08/28/2015  . Tachycardia   . Uveitis     Past Surgical History:  Procedure Laterality Date  . CYSTECTOMY    . LUMBAR SPINE SURGERY    . ROTATOR CUFF REPAIR    . TOTAL ABDOMINAL HYSTERECTOMY       Current Outpatient Prescriptions  Medication Sig Dispense Refill  . albuterol (PROVENTIL HFA;VENTOLIN HFA) 108 (90 BASE) MCG/ACT inhaler Inhale 2 puffs into the lungs  every 6 (six) hours as needed for wheezing or shortness of breath.    . cholecalciferol (VITAMIN D) 1000 units tablet Take 1,000 Units by mouth daily.    . diclofenac sodium (VOLTAREN) 1 % GEL Voltaren Gel 3 grams to 3 large joints upto TID 3 TUBES with 3 refills 3 Tube 3  . fluticasone (FLONASE) 50 MCG/ACT nasal spray Place 2 sprays into both nostrils daily as needed for allergies or rhinitis.    . folic acid (FOLVITE) 1 MG tablet Take 2 tablets (2 mg total) by mouth daily. 180 tablet 3  . losartan (COZAAR) 50 MG tablet Take 50 mg by mouth daily.  1  . methotrexate (RHEUMATREX) 2.5 MG tablet Take 10 tablets (25 mg  total) by mouth once a week. Caution:Chemotherapy. Protect from light. 120 tablet 0  . Multiple Vitamin (MULTIVITAMIN WITH MINERALS) TABS tablet Take 1 tablet by mouth daily.    . ondansetron (ZOFRAN) 4 MG tablet Take 4 mg by mouth as needed for nausea or vomiting.    . pantoprazole (PROTONIX) 40 MG tablet Take 40 mg by mouth daily.  5  . tapentadol (NUCYNTA) 50 MG TABS tablet Take 50 mg by mouth 3 (three) times daily as needed.    Marland Kitchen losartan (COZAAR) 25 MG tablet TAKE 1 TABLET BY MOUTH DAILY IN ADDITION TO THE 50 MG TABLET 30 tablet 5  . metoprolol (LOPRESSOR) 50 MG tablet Take 1 tablet (50 mg total) by mouth 2 (two) times daily. 180 tablet 3   No current facility-administered medications for this visit.     Allergies:   Codeine; Erythromycin; Sulfa antibiotics; and Tramadol    Social History:  The patient  reports that she has never smoked. She has never used smokeless tobacco. She reports that she does not drink alcohol or use drugs.   Family History:  The patient's family history includes Scleroderma in her daughter. She was adopted.    ROS:  Please see the history of present illness.   Otherwise, review of systems are positive for none.   All other systems are reviewed and negative.    PHYSICAL EXAM: VS:  BP (!) 149/82   Pulse 72   Ht 5\' 3"  (1.6 m)   Wt 64.3 kg (141 lb 12.8 oz)   BMI 25.12 kg/m  , BMI Body mass index is 25.12 kg/m. GENERAL:  Well appearing HEENT:  Pupils equal round and reactive, fundi not visualized, oral mucosa unremarkable NECK:  No jugular venous distention, waveform within normal limits, carotid upstroke brisk and symmetric, no bruits, no thyromegaly LYMPHATICS:  No cervical adenopathy LUNGS:  Clear to auscultation bilaterally CHEST:  Unremarkable HEART:  PMI not displaced or sustained,S1 and S2 within normal limits, no S3, no S4, no clicks, no rubs, no murmurs ABD:  Flat, positive bowel sounds normal in frequency in pitch, no bruits, no rebound, no  guarding, no midline pulsatile mass, no hepatomegaly, no splenomegaly EXT:  2 plus pulses throughout, no edema, no cyanosis no clubbing SKIN:  No rashes no nodules NEURO:  Cranial nerves II through XII grossly intact, motor grossly intact throughout PSYCH:  Cognitively intact, oriented to person place and time  EKG:  EKG is ordered today. 05/04/16:Sinus rhythm.  Rate 74 bpm.  Inferior TWI. 11/18/16: Sinus rhythm. Rate 72 bpm. We will monitor   Recent Labs: 11/17/2016: ALT 22; BUN 13; Creatinine, Ser 0.92; Hemoglobin 12.7; Platelets 269; Potassium 4.2; Sodium 138    Lipid Panel No results found for: CHOL, TRIG, HDL,  CHOLHDL, VLDL, LDLCALC, LDLDIRECT    Wt Readings from Last 3 Encounters:  11/18/16 64.3 kg (141 lb 12.8 oz)  11/17/16 63.5 kg (140 lb)  10/06/16 63.5 kg (140 lb)      Other studies Reviewed: Additional studies/ records that were reviewed today include: PCP note  Review of the above records demonstrates:  Please see elsewhere in the note.     CPX 05/01/15: Conclusion: Exercise testing with gas-exchange demonstrates a mild functional impairment when compared to matched sedentary norms. There is no significant evidence of EIB. The combination of reduced PVO2, hypertensive BP, elevated VE/VCO2 slope, and flat O2 pulse suggests possible circulatory limitations (specifically diastolic dysfunction).   2014 CMRI 1. Normal LV size and systolic function, EF 69%.  2. Normal RV size and systolic function.  3. No myocardial delayed enhancement, so no definitive evidence for prior MI, infiltrative disease, or myocarditis.  4. No evidence on this exam for cardiac sarcoidosis.   ASSESSMENT AND PLAN:  # SVT: No recent episodes of SVT.  Continue metoprolol.    # Diastolic dysfunction: CPX was suggestive of diastolic dysfunction and her symptoms have improved with metoprolol.  However, her echo only showed grade 1 diastolic function and she does not clinically appear to  have heart failure. Continue metoprolol and losartan.   # Hypertension: BP is above goal. Continue metoprolol and increase losartan to 75 mg daily.  Repeat BMP at follow-up appointment.  # Sarcoidosis: No evidence of cardiac involvement on MRI 10/2012.  # Chest pain: Renee Jones symptoms seem to be most consistent with esophageal spasm. I recommended that if this occurs again she takes Protonix twice daily. Should her symptoms are nonexertional and not associated with any shortness of breath or other concerning cardiac symptoms.   Current medicines are reviewed at length with the patient today.  The patient does not have concerns regarding medicines.  The following changes have been made:  Increase losartan to 75 mg daily.  Labs/ tests ordered today include: none.  Orders Placed This Encounter  Procedures  . EKG 12-Lead     Disposition:   FU with Dr. Elmarie Shiley C. Jones in 1 months    Signed, Chilton Si, MD  11/20/2016 7:52 AM    [facility]

## 2016-11-24 DIAGNOSIS — E559 Vitamin D deficiency, unspecified: Secondary | ICD-10-CM | POA: Diagnosis not present

## 2016-11-24 DIAGNOSIS — R7303 Prediabetes: Secondary | ICD-10-CM | POA: Diagnosis not present

## 2016-11-24 DIAGNOSIS — I10 Essential (primary) hypertension: Secondary | ICD-10-CM | POA: Diagnosis not present

## 2016-11-24 DIAGNOSIS — H209 Unspecified iridocyclitis: Secondary | ICD-10-CM | POA: Diagnosis not present

## 2016-11-24 DIAGNOSIS — K219 Gastro-esophageal reflux disease without esophagitis: Secondary | ICD-10-CM | POA: Diagnosis not present

## 2016-11-24 DIAGNOSIS — D869 Sarcoidosis, unspecified: Secondary | ICD-10-CM | POA: Diagnosis not present

## 2016-11-24 DIAGNOSIS — E7800 Pure hypercholesterolemia, unspecified: Secondary | ICD-10-CM | POA: Diagnosis not present

## 2016-12-15 ENCOUNTER — Other Ambulatory Visit: Payer: Self-pay | Admitting: Radiology

## 2016-12-15 NOTE — Progress Notes (Signed)
Lab orders placed for infusions every 2 months. Previous orders with infusion now are not available.

## 2016-12-16 ENCOUNTER — Encounter: Payer: Self-pay | Admitting: Cardiovascular Disease

## 2016-12-16 ENCOUNTER — Ambulatory Visit (INDEPENDENT_AMBULATORY_CARE_PROVIDER_SITE_OTHER): Payer: Medicare Other | Admitting: Cardiovascular Disease

## 2016-12-16 VITALS — BP 142/74 | HR 67 | Ht 63.0 in | Wt 139.2 lb

## 2016-12-16 DIAGNOSIS — I471 Supraventricular tachycardia: Secondary | ICD-10-CM | POA: Diagnosis not present

## 2016-12-16 DIAGNOSIS — I10 Essential (primary) hypertension: Secondary | ICD-10-CM

## 2016-12-16 MED ORDER — HYDROCHLOROTHIAZIDE 12.5 MG PO CAPS: 12.5000 mg | ORAL_CAPSULE | Freq: Every day | ORAL | 5 refills | Status: DC

## 2016-12-16 NOTE — Progress Notes (Signed)
Cardiology Office Note   Date:  12/16/2016   ID:  Renee Jones, DOB 10-Jul-1951, MRN [id number]  PCP:  Clayborn Heron, MD  Cardiologist:   Chilton Si, MD  EP: Loman Brooklyn, MD   Chief Complaint  Patient presents with  . Follow-up    1 month; Pt states no Sx    Patient ID:   Renee Jones is a 66 y.o. female paroxysmal SVT, HTN and sarcoidosis.    History of Present Illness: Renee Jones reports episodes of SVT that have been occurring for as long as she can remember.  She previously took a beta blocker which helped her symptoms but stopped taking it 20 years ago because she didn't think she needed it anymore.  Renee Jones was previously a patient of Dr. Michaelle Copas and had a Holter monitor documenting SVT.  Renee Jones was evaluated in the ED 02/2015 with elevated BP to 197/120.  She received IV medication with improvement. Renee Jones had a cardiac MRI 10/2012 that did not reveal any delayed enhancement concerning for cardiac involvement of her sarcoid.  She underwent CPX testing to evaluate shortness of breath and it was suggestive of diastolic dysfunction.  She was started on metoprolol for SVT and it also helped her shortness of breath.  She was referred for an echo 08/2015 that revealed LVEF 55-60% with grade 1 diastolic dysfunction.  She is a recurrent episode of SVT while getting a R infusion. She is able to stop it with vagal maneuvers. She saw Dr. Elberta Fortis and has not been interested in ablation. Metoprolol was increased with an improvement in her symptoms. At her last appointment she did report chest tightness that was felt to be related to gastroesophageal reflux disease. She Followed up with PCP who recommended that she elevate the head of her bed and does not eat late in the evening. This has helped her symptoms. She denies any recurrent chest pain. She also hasn't had any palpitations lately. She brings a log of her blood pressures that have ranged from  119-155/60s-80s.  She is also started back exercising 3 times weekly. She is without complaint at this time.  Renee Jones's husband died last year.  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 She will report to joining a gym soon.   Past Medical History:  Diagnosis Date  . Allergy   . Asthma   . Bronchial spasm 09/14/2016  . DDD (degenerative disc disease), lumbar 09/14/2016  . Detached retina 09/14/2016  . Diastolic dysfunction 08/28/2015  . Essential hypertension 08/28/2015  . GERD (gastroesophageal reflux disease)   . Glaucoma 09/14/2016  . Hypertension   . Lumbar disc disease   . Neuromuscular disorder (HCC)   . Osteoarthritis of both hands 09/14/2016  . Osteoarthritis of both knees 09/14/2016  . Sarcoidosis (HCC)   . Sarcoidosis (HCC)    with +lymph node biopsy  . SVT (supraventricular tachycardia) (HCC) 08/28/2015  . Tachycardia   . Uveitis     Past Surgical History:  Procedure Laterality Date  . CYSTECTOMY    . LUMBAR SPINE SURGERY    . ROTATOR CUFF REPAIR    . TOTAL ABDOMINAL HYSTERECTOMY       Current Outpatient Prescriptions  Medication Sig Dispense Refill  . albuterol (PROVENTIL HFA;VENTOLIN HFA) 108 (90 BASE) MCG/ACT inhaler Inhale 2 puffs into the lungs every 6 (six)  hours as needed for wheezing or shortness of breath.    . cholecalciferol (VITAMIN D) 1000 units tablet Take 1,000 Units by mouth daily.    . fluticasone (FLONASE) 50 MCG/ACT nasal spray Place 2 sprays into both nostrils daily as needed for allergies or rhinitis.    . folic acid (FOLVITE) 1 MG tablet Take 2 tablets (2 mg total) by mouth daily. 180 tablet 3  . losartan (COZAAR) 50 MG tablet Take 50 mg by mouth daily.  1  . methotrexate (RHEUMATREX) 2.5 MG tablet Take 10 tablets (25 mg total) by mouth once a week. Caution:Chemotherapy. Protect from light. 120 tablet 0  .  metoprolol (LOPRESSOR) 50 MG tablet Take 1 tablet (50 mg total) by mouth 2 (two) times daily. 180 tablet 3  . Multiple Vitamin (MULTIVITAMIN WITH MINERALS) TABS tablet Take 1 tablet by mouth daily.    . ondansetron (ZOFRAN) 4 MG tablet Take 4 mg by mouth as needed for nausea or vomiting.    . pantoprazole (PROTONIX) 40 MG tablet Take 40 mg by mouth daily.  5  . hydrochlorothiazide (MICROZIDE) 12.5 MG capsule Take 1 capsule (12.5 mg total) by mouth daily. 30 capsule 5   No current facility-administered medications for this visit.     Allergies:   Codeine; Erythromycin; Sulfa antibiotics; and Tramadol    Social History:  The patient  reports that she has never smoked. She has never used smokeless tobacco. She reports that she does not drink alcohol or use drugs.   Family History:  The patient's family history includes Scleroderma in her daughter. She was adopted.    ROS:  Please see the history of present illness.   Otherwise, review of systems are positive for none.   All other systems are reviewed and negative.    PHYSICAL EXAM: VS:  BP (!) 142/74   Pulse 67   Ht 5\' 3"  (1.6 m)   Wt 63.1 kg (139 lb 3.2 oz)   BMI 24.66 kg/m  , BMI Body mass index is 24.66 kg/m. GENERAL:  Well appearing HEENT:  Pupils equal round and reactive, fundi not visualized, oral mucosa unremarkable NECK:  No jugular venous distention, waveform within normal limits, carotid upstroke brisk and symmetric, no bruits LYMPHATICS:  No cervical adenopathy LUNGS:  Clear to auscultation bilaterally CHEST:  Unremarkable HEART:  PMI not displaced or sustained,S1 and S2 within normal limits, no S3, no S4, no clicks, no rubs, no murmurs ABD:  Flat, positive bowel sounds normal in frequency in pitch, no bruits, no rebound, no guarding, no midline pulsatile mass, no hepatomegaly, no splenomegaly EXT:  2 plus pulses throughout, no edema, no cyanosis no clubbing SKIN:  No rashes no nodules NEURO:  Cranial nerves II through  XII grossly intact, motor grossly intact throughout PSYCH:  Cognitively intact, oriented to person place and time  EKG:  EKG is not ordered today. 05/04/16:Sinus rhythm.  Rate 74 bpm.  Inferior TWI. 11/18/16: Sinus rhythm. Rate 72 bpm. We will monitor   Recent Labs: 11/17/2016: ALT 22; BUN 13; Creatinine, Ser 0.92; Hemoglobin 12.7; Platelets 269; Potassium 4.2; Sodium 138    Lipid Panel No results found for: CHOL, TRIG, HDL, CHOLHDL, VLDL, LDLCALC, LDLDIRECT    Wt Readings from Last 3 Encounters:  12/16/16 63.1 kg (139 lb 3.2 oz)  11/18/16 64.3 kg (141 lb 12.8 oz)  11/17/16 63.5 kg (140 lb)      Other studies Reviewed: Additional studies/ records that were reviewed today include: PCP note  Review  of the above records demonstrates:  Please see elsewhere in the note.     CPX 05/01/15: Conclusion: Exercise testing with gas-exchange demonstrates a mild functional impairment when compared to matched sedentary norms. There is no significant evidence of EIB. The combination of reduced PVO2, hypertensive BP, elevated VE/VCO2 slope, and flat O2 pulse suggests possible circulatory limitations (specifically diastolic dysfunction).   2014 CMRI 1. Normal LV size and systolic function, EF 69%.  2. Normal RV size and systolic function.  3. No myocardial delayed enhancement, so no definitive evidence for prior MI, infiltrative disease, or myocarditis.  4. No evidence on this exam for cardiac sarcoidosis.   ASSESSMENT AND PLAN:  # SVT: No recent episodes of SVT.  Continue metoprolol.    # Diastolic dysfunction: CPX was suggestive of diastolic dysfunction and her symptoms have improved with metoprolol.  However, her echo only showed grade 1 diastolic function and she does not clinically appear to have heart failure. Continue metoprolol and losartan.   # Hypertension: BP is above goal.  Reduce losartan to 50 mg daily and add hydrochlorothiazide 12.5 mg daily.  # Sarcoidosis: No  evidence of cardiac involvement on MRI 10/2012.  # Chest pain: Resolved with treatment of GERD   Current medicines are reviewed at length with the patient today.  The patient does not have concerns regarding medicines.  The following changes have been made:  Reduce losartan to 50mg  daily and add HCTZ 12.5 mg daily   Labs/ tests ordered today include: none.  No orders of the defined types were placed in this encounter.    Disposition:   FU with Dr. Elmarie Shiley C. [place] in 1 months    Signed, Chilton Si, MD  12/16/2016 2:39 PM    [facility]

## 2016-12-16 NOTE — Patient Instructions (Signed)
Medication Instructions: Decrease Losartan to 50mg  daily Start Hydrochlorothiazide 12.5mg  daily  Labwork: None  Procedures/Testing: None  Follow-Up: Your physician recommends that you schedule a follow-up appointment in: ONE MONTH WITH DR. [place]  MONITOR YOUR BLOOD PRESSURE AT HOME DAILY. CALL THE OFFICE IN ONE WEEK IF NOT BELOW 130/80.  If you need a refill on your cardiac medications before your next appointment, please call your pharmacy.

## 2016-12-28 ENCOUNTER — Other Ambulatory Visit (HOSPITAL_COMMUNITY): Payer: Self-pay | Admitting: *Deleted

## 2016-12-29 ENCOUNTER — Ambulatory Visit (HOSPITAL_COMMUNITY)
Admission: RE | Admit: 2016-12-29 | Discharge: 2016-12-29 | Disposition: A | Discharge status: Home / Self Care | Payer: Medicare Other | Source: Ambulatory Visit | Attending: Rheumatology | Admitting: Rheumatology

## 2016-12-29 DIAGNOSIS — I10 Essential (primary) hypertension: Secondary | ICD-10-CM | POA: Diagnosis not present

## 2016-12-29 DIAGNOSIS — R079 Chest pain, unspecified: Secondary | ICD-10-CM | POA: Insufficient documentation

## 2016-12-29 DIAGNOSIS — I471 Supraventricular tachycardia: Secondary | ICD-10-CM | POA: Insufficient documentation

## 2016-12-29 DIAGNOSIS — D869 Sarcoidosis, unspecified: Secondary | ICD-10-CM | POA: Insufficient documentation

## 2016-12-29 MED ORDER — ACETAMINOPHEN 325 MG PO TABS
650.0000 mg | ORAL_TABLET | ORAL | Status: DC
  Administered 2016-12-29: 650 mg via ORAL

## 2016-12-29 MED ORDER — SODIUM CHLORIDE 0.9 % IV SOLN
3.0000 mg/kg | INTRAVENOUS | Status: AC
  Administered 2016-12-29: 200 mg via INTRAVENOUS
  Filled 2016-12-29: qty 20

## 2016-12-29 MED ORDER — ACETAMINOPHEN 325 MG PO TABS
ORAL_TABLET | ORAL | Status: AC
  Filled 2016-12-29: qty 2

## 2016-12-29 MED ORDER — DIPHENHYDRAMINE HCL 25 MG PO CAPS
25.0000 mg | ORAL_CAPSULE | ORAL | Status: DC
  Administered 2016-12-29: 25 mg via ORAL

## 2016-12-29 MED ORDER — DIPHENHYDRAMINE HCL 25 MG PO CAPS
ORAL_CAPSULE | ORAL | Status: AC
  Filled 2016-12-29: qty 1

## 2016-12-31 DIAGNOSIS — L820 Inflamed seborrheic keratosis: Secondary | ICD-10-CM | POA: Diagnosis not present

## 2017-01-01 ENCOUNTER — Other Ambulatory Visit: Payer: Self-pay | Admitting: Rheumatology

## 2017-01-01 ENCOUNTER — Telehealth: Payer: Self-pay | Admitting: Cardiovascular Disease

## 2017-01-01 NOTE — Telephone Encounter (Signed)
Patient advised prescription sent to the pharmacy.  

## 2017-01-01 NOTE — Telephone Encounter (Signed)
Last Visit: 09/15/16 Next Visit: 01/13/17 Labs: 11/17/16 Elevated glucose  Okay to refill MTX?

## 2017-01-01 NOTE — Telephone Encounter (Signed)
°*  STAT* If patient is at the pharmacy, call can be transferred to refill team.   1. Which medications need to be refilled? (please list name of each medication and dose if known) Metoprolol 50mg   2. Which pharmacy/location (including street and city if local pharmacy) is medication to be sent to?[facility] - [place], [place] - [address]  3. Do they need a 30 day or 90 day supply? 90

## 2017-01-01 NOTE — Telephone Encounter (Signed)
Patient left a message requesting a refill on her MTX.  It is due on Sunday.  CB#[phone].  Thank you.

## 2017-01-04 MED ORDER — METOPROLOL TARTRATE 50 MG PO TABS: 50.0000 mg | ORAL_TABLET | Freq: Two times a day (BID) | ORAL | 3 refills | Status: DC

## 2017-01-06 ENCOUNTER — Telehealth: Payer: Self-pay | Admitting: Rheumatology

## 2017-01-06 NOTE — Telephone Encounter (Signed)
Patient would like to start the hyalgan injection series. Please advise.

## 2017-01-07 DIAGNOSIS — R10816 Epigastric abdominal tenderness: Secondary | ICD-10-CM | POA: Diagnosis not present

## 2017-01-07 DIAGNOSIS — Z1211 Encounter for screening for malignant neoplasm of colon: Secondary | ICD-10-CM | POA: Diagnosis not present

## 2017-01-07 DIAGNOSIS — R194 Change in bowel habit: Secondary | ICD-10-CM | POA: Diagnosis not present

## 2017-01-07 DIAGNOSIS — R197 Diarrhea, unspecified: Secondary | ICD-10-CM | POA: Diagnosis not present

## 2017-01-13 ENCOUNTER — Encounter: Payer: Self-pay | Admitting: Rheumatology

## 2017-01-13 ENCOUNTER — Ambulatory Visit (INDEPENDENT_AMBULATORY_CARE_PROVIDER_SITE_OTHER): Payer: Medicare Other | Admitting: Rheumatology

## 2017-01-13 VITALS — BP 115/67 | HR 92 | Resp 15 | Ht 63.5 in | Wt 140.0 lb

## 2017-01-13 DIAGNOSIS — M170 Bilateral primary osteoarthritis of knee: Secondary | ICD-10-CM

## 2017-01-13 DIAGNOSIS — D869 Sarcoidosis, unspecified: Secondary | ICD-10-CM

## 2017-01-13 DIAGNOSIS — H209 Unspecified iridocyclitis: Secondary | ICD-10-CM | POA: Diagnosis not present

## 2017-01-13 DIAGNOSIS — Z79899 Other long term (current) drug therapy: Secondary | ICD-10-CM | POA: Diagnosis not present

## 2017-01-13 DIAGNOSIS — D8686 Sarcoid arthropathy: Secondary | ICD-10-CM | POA: Diagnosis not present

## 2017-01-13 MED ORDER — FOLIC ACID 1 MG PO TABS: 2.0000 mg | ORAL_TABLET | Freq: Every day | ORAL | 4 refills | Status: DC

## 2017-01-13 MED ORDER — FOLIC ACID 1 MG PO TABS: 2.0000 mg | ORAL_TABLET | Freq: Every day | ORAL | 3 refills | Status: DC

## 2017-01-13 NOTE — Progress Notes (Signed)
Office Visit Note  Patient: Renee Jones             Date of Birth: May 04, 1951           MRN: [id number]             PCP: Aretta Nip, MD Referring: Aretta Nip, MD Visit Date: 01/13/2017 Occupation: '@GUAROCC'$ @    Subjective:  Follow-up   History of Present Illness: Renee Jones is a 66 y.o. female  Follow up on sarcoidosis and uveitis. Taking Remicade by infusion every 6 weeks and methotrexate 10 pills per week and folic acid from over-the-counter per pharmacy technician at the advice.  Patient is doing really well with her treatment. No joint pain swelling or stiffness. No shortness of breath.  She does have bilateral knee joint pain and we are applying for Hyalgan for both knees 5 but we'll be happy with Euflexxa or Orthovisc if insurance company prefers does over Hyalgan.  Her pain to her knees are rated 4 on a scale of 0-10.  She has not seen Dr. Chase Caller in over 2 years since she is doing really well with her lungs. I did ask the patient to follow with Dr. Chase Caller to maintain her good outcome.  She is also not seen [facility] ophthalmology clinic since her eyes are doing really well. She has an appointment with her eye doctor tomorrow and if he finds any abnormalities, he usually centered directly to [facility] for evaluation and treatment. She will inform us if this happens.  Patient was prescribed folic acid 2 mg daily. When she went to pick it up at her pharmacy, the pharmacy technician advised her to use the over-the-counter product. She bought 329 g folic acid and has been taking 2 regularly. She asks [place] today if this is the adequate dose and I reminded her that she would need 5 of those small pills to equal our Rx strength prescription. She will start taking 5 over-the-counter folic acid until all gone and then she will convert to prescription strength folic acid and she'll take 2 daily.   Activities of Daily Living:    Patient reports morning stiffness for 15 minutes.   Patient Denies nocturnal pain.  Difficulty dressing/grooming: Denies Difficulty climbing stairs: Denies Difficulty getting out of chair: Denies Difficulty using hands for taps, buttons, cutlery, and/or writing: Denies   Review of Systems  Constitutional: Negative for fatigue.  HENT: Negative for mouth sores and mouth dryness.   Eyes: Negative for dryness.  Respiratory: Negative for shortness of breath.   Gastrointestinal: Negative for constipation and diarrhea.  Musculoskeletal: Negative for myalgias and myalgias.  Skin: Negative for sensitivity to sunlight.  Psychiatric/Behavioral: Negative for decreased concentration and sleep disturbance.    PMFS History:  Patient Active Problem List   Diagnosis Date Noted  . High risk medications (not anticoagulants) long-term use 01/13/2017  . GERD (gastroesophageal reflux disease) 09/14/2016  . Bronchial spasm 09/14/2016  . Glaucoma 09/14/2016  . Detached retina 09/14/2016  . Osteoarthritis of both hands 09/14/2016  . Osteoarthritis of both knees 09/14/2016  . DDD (degenerative disc disease), lumbar 09/14/2016  . Uveitis 06/22/2016  . SVT (supraventricular tachycardia) ([place]) 08/28/2015  . Essential hypertension 08/28/2015  . Diastolic dysfunction 92/42/6834  . Dyspnea and respiratory abnormality 04/23/2015  . Sarcoidosis ([place]) 09/30/2012  . Pulmonary nodules 09/30/2012  . Tachycardia 09/30/2012  . Sarcoid arthropathy ([place]) 09/30/2012    Past Medical History:  Diagnosis Date  . Allergy   .  Asthma   . Bronchial spasm 09/14/2016  . DDD (degenerative disc disease), lumbar 09/14/2016  . Detached retina 09/14/2016  . Diastolic dysfunction 81/10/5724  . Essential hypertension 08/28/2015  . GERD (gastroesophageal reflux disease)   . Glaucoma 09/14/2016  . Hypertension   . Lumbar disc disease   . Neuromuscular disorder ([place])   . Osteoarthritis of both hands 09/14/2016  .  Osteoarthritis of both knees 09/14/2016  . Sarcoidosis ([place])   . Sarcoidosis ([place])    with +lymph node biopsy  . SVT (supraventricular tachycardia) ([place]) 08/28/2015  . Tachycardia   . Uveitis     Family History  Problem Relation Age of Onset  . Adopted: Yes  . Scleroderma Daughter    Past Surgical History:  Procedure Laterality Date  . CYSTECTOMY    . LUMBAR SPINE SURGERY    . ROTATOR CUFF REPAIR    . TOTAL ABDOMINAL HYSTERECTOMY     Social History   Social History Narrative  . No narrative on file     Objective: Vital Signs: BP 115/67   Pulse 92   Resp 15   Ht 5' 3.5" (1.613 m)   Wt 140 lb (63.5 kg)   BMI 24.41 kg/m    Physical Exam  Constitutional: She is oriented to person, place, and time. She appears well-developed and well-nourished.  HENT:  Head: Normocephalic and atraumatic.  Eyes: EOM are normal. Pupils are equal, round, and reactive to light.  Cardiovascular: Normal rate, regular rhythm and normal heart sounds.  Exam reveals no gallop and no friction rub.   No murmur heard. Pulmonary/Chest: Effort normal and breath sounds normal. She has no wheezes. She has no rales.  Abdominal: Soft. Bowel sounds are normal. She exhibits no distension. There is no tenderness. There is no guarding. No hernia.  Musculoskeletal: Normal range of motion. She exhibits no edema, tenderness or deformity.  Lymphadenopathy:    She has no cervical adenopathy.  Neurological: She is alert and oriented to person, place, and time. Coordination normal.  Skin: Skin is warm and dry. Capillary refill takes less than 2 seconds. No rash noted.  Psychiatric: She has a normal mood and affect. Her behavior is normal.  Nursing note and vitals reviewed.    Musculoskeletal Exam:  Full range of motion of all joints Grip strength is equal and strong bilaterally Fiber myalgia tender points are all absent  CDAI Exam: CDAI Homunculus Exam:   Joint Counts:  CDAI Tender Joint count: 0 CDAI  Swollen Joint count: 0  Global Assessments:  Patient Global Assessment: 4 Provider Global Assessment: 4  CDAI Calculated Score: 8    Investigation: Findings:  TB gold done 07/14/2016 was negative at [facility]. Note the patient gets Remicade infusions and she gets her TB gold as well as her labs on at the appropriate times when she gets her infusions every 6 weeks or so  TB goal is negative as of September October 2017  Preauthorization for Hyalgan 5 bilateral knees in jANUARY/February and started injections in February/March of possible.   Hospital Outpatient Visit on 11/17/2016  Component Date Value Ref Range Status  . WBC 11/17/2016 5.8  4.0 - 10.5 K/uL Final  . RBC 11/17/2016 4.22  3.87 - 5.11 MIL/uL Final  . Hemoglobin 11/17/2016 12.7  12.0 - 15.0 g/dL Final  . HCT 11/17/2016 38.0  36.0 - 46.0 % Final  . MCV 11/17/2016 90.0  78.0 - 100.0 fL Final  . MCH 11/17/2016 30.1  26.0 - 34.0  pg Final  . MCHC 11/17/2016 33.4  30.0 - 36.0 g/dL Final  . RDW 11/17/2016 14.6  11.5 - 15.5 % Final  . Platelets 11/17/2016 269  150 - 400 K/uL Final  . Sodium 11/17/2016 138  135 - 145 mmol/L Final  . Potassium 11/17/2016 4.2  3.5 - 5.1 mmol/L Final  . Chloride 11/17/2016 107  101 - 111 mmol/L Final  . CO2 11/17/2016 26  22 - 32 mmol/L Final  . Glucose, Bld 11/17/2016 182* 65 - 99 mg/dL Final  . BUN 11/17/2016 13  6 - 20 mg/dL Final  . Creatinine, Ser 11/17/2016 0.92  0.44 - 1.00 mg/dL Final  . Calcium 11/17/2016 8.9  8.9 - 10.3 mg/dL Final  . Total Protein 11/17/2016 6.4* 6.5 - 8.1 g/dL Final  . Albumin 11/17/2016 3.9  3.5 - 5.0 g/dL Final  . AST 11/17/2016 27  15 - 41 U/L Final  . ALT 11/17/2016 22  14 - 54 U/L Final  . Alkaline Phosphatase 11/17/2016 61  38 - 126 U/L Final  . Total Bilirubin 11/17/2016 0.8  0.3 - 1.2 mg/dL Final  . GFR calc non Af Amer 11/17/2016 >60  >60 mL/min Final  . GFR calc Af Amer 11/17/2016 >60  >60 mL/min Final   Comment: (NOTE) The eGFR has  been calculated using the CKD EPI equation. This calculation has not been validated in all clinical situations. eGFR's persistently <60 mL/min signify possible Chronic Kidney Disease.   Georgiann Hahn gap 11/17/2016 5  5 - 15 Final  Hospital Outpatient Visit on 08/25/2016  Component Date Value Ref Range Status  . WBC 08/25/2016 6.4  4.0 - 10.5 K/uL Final  . RBC 08/25/2016 4.46  3.87 - 5.11 MIL/uL Final  . Hemoglobin 08/25/2016 13.6  12.0 - 15.0 g/dL Final  . HCT 08/25/2016 39.6  36.0 - 46.0 % Final  . MCV 08/25/2016 88.8  78.0 - 100.0 fL Final  . MCH 08/25/2016 30.5  26.0 - 34.0 pg Final  . MCHC 08/25/2016 34.3  30.0 - 36.0 g/dL Final  . RDW 08/25/2016 13.6  11.5 - 15.5 % Final  . Platelets 08/25/2016 314  150 - 400 K/uL Final  . Sodium 08/25/2016 142  135 - 145 mmol/L Final  . Potassium 08/25/2016 4.2  3.5 - 5.1 mmol/L Final  . Chloride 08/25/2016 111  101 - 111 mmol/L Final  . CO2 08/25/2016 26  22 - 32 mmol/L Final  . Glucose, Bld 08/25/2016 120* 65 - 99 mg/dL Final  . BUN 08/25/2016 14  6 - 20 mg/dL Final  . Creatinine, Ser 08/25/2016 0.95  0.44 - 1.00 mg/dL Final  . Calcium 08/25/2016 9.6  8.9 - 10.3 mg/dL Final  . Total Protein 08/25/2016 7.0  6.5 - 8.1 g/dL Final  . Albumin 08/25/2016 4.2  3.5 - 5.0 g/dL Final  . AST 08/25/2016 21  15 - 41 U/L Final  . ALT 08/25/2016 16  14 - 54 U/L Final  . Alkaline Phosphatase 08/25/2016 58  38 - 126 U/L Final  . Total Bilirubin 08/25/2016 0.7  0.3 - 1.2 mg/dL Final  . GFR calc non Af Amer 08/25/2016 >60  >60 mL/min Final  . GFR calc Af Amer 08/25/2016 >60  >60 mL/min Final   Comment: (NOTE) The eGFR has been calculated using the CKD EPI equation. This calculation has not been validated in all clinical situations. eGFR's persistently <60 mL/min signify possible Chronic Kidney Disease.   . Anion gap 08/25/2016 5  5 -  15 Final  . Neutrophils Relative % 08/25/2016 54  % Final  . Neutro Abs 08/25/2016 3.5  1.7 - 7.7 K/uL Final  . Lymphocytes  Relative 08/25/2016 38  % Final  . Lymphs Abs 08/25/2016 2.4  0.7 - 4.0 K/uL Final  . Monocytes Relative 08/25/2016 6  % Final  . Monocytes Absolute 08/25/2016 0.4  0.1 - 1.0 K/uL Final  . Eosinophils Relative 08/25/2016 2  % Final  . Eosinophils Absolute 08/25/2016 0.1  0.0 - 0.7 K/uL Final  . Basophils Relative 08/25/2016 0  % Final  . Basophils Absolute 08/25/2016 0.0  0.0 - 0.1 K/uL Final      Imaging: No results found.  Speciality Comments: No specialty comments available.    Procedures:  No procedures performed Allergies: Augmentin [amoxicillin-pot clavulanate]; Codeine; Erythromycin; Sulfa antibiotics; and Tramadol   Assessment / Plan:     Visit Diagnoses: Sarcoid arthropathy ([place])  Uveitis  Sarcoidosis ([place])  High risk medications (not anticoagulants) long-term use - 01/13/2017: MTX- 10 tabs/wk  ///Remicade q 6 wks //adequate response./// off of plq since 2017    Plan: #1: Sarcoidosis. Doing really well. Having very good relief response to Remicade 3 mg/kg every 6 weeks. Last dose was about 3 weeks ago. 12/29/2016 Also taking methotrexate, 10 tablets every week Folic acid 2 mg every day Patient has been doing so well she has not had to follow up with [facility]. She does see her ophthalmologist on a regular basis and she has an appointment tomorrow. If there is any abnormal findings in the ophthalmology visit, he sent her directly to [facility].  She has not had a uveitis flare in a long time. No shortness of breath.  Lab work from January 2018 done at the infusion time is normal. Please see today's chart #2: History of sarcoidosis. The last time she saw Dr. Chase Caller was about 2 years ago. She's been doing well but I've encouraged the patient to go back and see him on a regular basis to make sure that the sarcoidosis is also evaluated by Dr. Chase Caller since he is a pulmonologist. Patient is agreeable.  No joint pain swelling and stiffness.  Apply for  Hyalgan 5 to both knees. I have sent a message to [place].  Orders: No orders of the defined types were placed in this encounter.  No orders of the defined types were placed in this encounter.   Face-to-face time spent with patient was 30 minutes. 50% of time was spent in counseling and coordination of care.  Follow-Up Instructions: No Follow-up on file.   Eliezer Lofts, PA-C  Note - This record has been created using [employer].  Chart creation errors have been sought, but may not always  have been located. Such creation errors do not reflect on  the standard of medical care.

## 2017-01-14 DIAGNOSIS — H2513 Age-related nuclear cataract, bilateral: Secondary | ICD-10-CM | POA: Diagnosis not present

## 2017-01-14 DIAGNOSIS — H04123 Dry eye syndrome of bilateral lacrimal glands: Secondary | ICD-10-CM | POA: Diagnosis not present

## 2017-01-14 DIAGNOSIS — H11423 Conjunctival edema, bilateral: Secondary | ICD-10-CM | POA: Diagnosis not present

## 2017-01-14 DIAGNOSIS — H11153 Pinguecula, bilateral: Secondary | ICD-10-CM | POA: Diagnosis not present

## 2017-01-14 DIAGNOSIS — H18413 Arcus senilis, bilateral: Secondary | ICD-10-CM | POA: Diagnosis not present

## 2017-01-14 DIAGNOSIS — H40012 Open angle with borderline findings, low risk, left eye: Secondary | ICD-10-CM | POA: Diagnosis not present

## 2017-01-15 ENCOUNTER — Encounter: Payer: Self-pay | Admitting: Cardiovascular Disease

## 2017-01-15 ENCOUNTER — Ambulatory Visit (INDEPENDENT_AMBULATORY_CARE_PROVIDER_SITE_OTHER): Payer: Medicare Other | Admitting: Cardiovascular Disease

## 2017-01-15 VITALS — BP 112/74 | HR 62 | Ht 63.5 in | Wt 138.0 lb

## 2017-01-15 DIAGNOSIS — I519 Heart disease, unspecified: Secondary | ICD-10-CM

## 2017-01-15 DIAGNOSIS — I10 Essential (primary) hypertension: Secondary | ICD-10-CM | POA: Diagnosis not present

## 2017-01-15 DIAGNOSIS — I471 Supraventricular tachycardia: Secondary | ICD-10-CM

## 2017-01-15 DIAGNOSIS — I5189 Other ill-defined heart diseases: Secondary | ICD-10-CM

## 2017-01-15 MED ORDER — METOPROLOL TARTRATE 50 MG PO TABS: ORAL_TABLET | ORAL | 11 refills | Status: DC

## 2017-01-15 NOTE — Progress Notes (Signed)
Cardiology Office Note   Date:  01/15/2017   ID:  Renee Jones, DOB Oct 25, 1950, MRN [id number]  PCP:  Clayborn Heron, MD  Cardiologist:   Chilton Si, MD  EP: Loman Brooklyn, MD   Chief Complaint  Patient presents with  . Follow-up    Patient ID:   Renee Jones is a 66 y.o. female paroxysmal SVT, HTN, GERD, and sarcoidosis.    History of Present Illness: [name] reports episodes of SVT that have been occurring for as long as she can remember.  She previously took a beta blocker which helped her symptoms but stopped taking it 20 years ago because she didn't think she needed it anymore.  [name] was previously a patient of Dr. Michaelle Copas and had a Holter monitor documenting SVT.  [name] was evaluated in the ED 02/2015 with elevated BP to 197/120.   She had a cardiac MRI 10/2012 that did not reveal any delayed enhancement concerning for cardiac involvement of her sarcoid.  She underwent CPX testing to evaluate shortness of breath and it was suggestive of diastolic dysfunction.  She was started on metoprolol for SVT and it also helped her shortness of breath.  She was referred for an echo 08/2015 that revealed LVEF 55-60% with grade 1 diastolic dysfunction. She saw Dr. Elberta Fortis and has not been interested in ablation. Metoprolol was increased with an improvement in her symptoms.  Since her last appointment [name] has been doing well. She denies chest pain or shortness of breath. Her palpitations are much better controlled but not gone completely. She continues to exercise regularly and thinks that overall, this initially contributes to her improvement in the palpitations.  She denies lower extremity edema, orthopnea, or PND.  She is finally feeling settled after the death of her husband one year ago.  She retired from her job and is feeling less stress. She also thinks this has contributed to improvement in her palpitations and her blood pressure. At her last  appointment her blood pressure was poorly-controlled. Hydrochlorothiazide was added and losartan was reduced to 50 mg. She brings a log of her blood pressure showing that it has ranged from the 110s-150s/70s-80s.  Her heart rate ranges from the 60s-100s.  On average her BP is around 130s systolic.   [name]'s husband died last year.  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   Past Medical History:  Diagnosis Date  . Allergy   . Asthma   . Bronchial spasm 09/14/2016  . DDD (degenerative disc disease), lumbar 09/14/2016  . Detached retina 09/14/2016  . Diastolic dysfunction 08/28/2015  . Essential hypertension 08/28/2015  . GERD (gastroesophageal reflux disease)   . Glaucoma 09/14/2016  . Hypertension   . Lumbar disc disease   . Neuromuscular disorder (HCC)   . Osteoarthritis of both hands 09/14/2016  . Osteoarthritis of both knees 09/14/2016  . Sarcoidosis (HCC)   . Sarcoidosis (HCC)    with +lymph node biopsy  . SVT (supraventricular tachycardia) (HCC) 08/28/2015  . Tachycardia   . Uveitis     Past Surgical History:  Procedure Laterality Date  . CYSTECTOMY    . LUMBAR SPINE SURGERY    . ROTATOR CUFF REPAIR    . TOTAL ABDOMINAL HYSTERECTOMY       Current Outpatient Prescriptions  Medication Sig Dispense Refill  . albuterol (PROVENTIL HFA;VENTOLIN HFA) 108 (90 BASE)  MCG/ACT inhaler Inhale 2 puffs into the lungs every 6 (six) hours as needed for wheezing or shortness of breath.    . cholecalciferol (VITAMIN D) 1000 units tablet Take 1,000 Units by mouth daily.    . fluticasone (FLONASE) 50 MCG/ACT nasal spray Place 2 sprays into both nostrils daily as needed for allergies or rhinitis.    . folic acid (FOLVITE) 1 MG tablet Take 2 tablets (2 mg total) by mouth daily. 180 tablet 4  . hydrochlorothiazide (MICROZIDE) 12.5 MG capsule Take 1 capsule (12.5 mg total) by mouth daily. 30 capsule 5    . losartan (COZAAR) 50 MG tablet Take 50 mg by mouth daily.  1  . methotrexate (RHEUMATREX) 2.5 MG tablet TAKE 10 TABLETS BY MOUTH ONCE A WEEK 120 tablet 0  . metoprolol (LOPRESSOR) 50 MG tablet 1 AND 1/2 TABLET BY MOUTH TWICE A DAY 90 tablet 11  . Multiple Vitamin (MULTIVITAMIN WITH MINERALS) TABS tablet Take 1 tablet by mouth daily.    . ondansetron (ZOFRAN) 4 MG tablet Take 4 mg by mouth as needed for nausea or vomiting.    . pantoprazole (PROTONIX) 40 MG tablet Take 40 mg by mouth daily.  5   No current facility-administered medications for this visit.     Allergies:   Augmentin [amoxicillin-pot clavulanate]; Codeine; Erythromycin; Sulfa antibiotics; and Tramadol    Social History:  The patient  reports that she has never smoked. She has never used smokeless tobacco. She reports that she does not drink alcohol or use drugs.   Family History:  The patient's family history includes Scleroderma in her daughter. She was adopted.    ROS:  Please see the history of present illness.   Otherwise, review of systems are positive for none.   All other systems are reviewed and negative.    PHYSICAL EXAM: VS:  BP 112/74   Pulse 62   Ht 5' 3.5" (1.613 m)   Wt 62.6 kg (138 lb)   BMI 24.06 kg/m  , BMI Body mass index is 24.06 kg/m. GENERAL:  Well appearing HEENT:  Pupils equal round and reactive, fundi not visualized, oral mucosa unremarkable NECK:  No jugular venous distention, waveform within normal limits, carotid upstroke brisk and symmetric, no bruits LYMPHATICS:  No cervical adenopathy LUNGS:  Clear to auscultation bilaterally CHEST:  Unremarkable HEART:  PMI not displaced or sustained,S1 and S2 within normal limits, no S3, no S4, no clicks, no rubs, no murmurs ABD:  Flat, positive bowel sounds normal in frequency in pitch, no bruits, no rebound, no guarding, no midline pulsatile mass, no hepatomegaly, no splenomegaly EXT:  2 plus pulses throughout, no edema, no cyanosis no  clubbing SKIN:  No rashes no nodules NEURO:  Cranial nerves II through XII grossly intact, motor grossly intact throughout PSYCH:  Cognitively intact, oriented to person place and time  EKG:  EKG is not ordered today. 05/04/16:Sinus rhythm.  Rate 74 bpm.  Inferior TWI. 11/18/16: Sinus rhythm. Rate 72 bpm. We will monitor   Recent Labs: 11/17/2016: ALT 22; BUN 13; Creatinine, Ser 0.92; Hemoglobin 12.7; Platelets 269; Potassium 4.2; Sodium 138    Lipid Panel No results found for: CHOL, TRIG, HDL, CHOLHDL, VLDL, LDLCALC, LDLDIRECT    Wt Readings from Last 3 Encounters:  01/15/17 62.6 kg (138 lb)  01/13/17 63.5 kg (140 lb)  12/29/16 63.5 kg (140 lb)      Other studies Reviewed: Additional studies/ records that were reviewed today include: PCP note  Review of the  above records demonstrates:  Please see elsewhere in the note.     CPX 05/01/15: Conclusion: Exercise testing with gas-exchange demonstrates a mild functional impairment when compared to matched sedentary norms. There is no significant evidence of EIB. The combination of reduced PVO2, hypertensive BP, elevated VE/VCO2 slope, and flat O2 pulse suggests possible circulatory limitations (specifically diastolic dysfunction).   2014 CMRI 1. Normal LV size and systolic function, EF 69%.  2. Normal RV size and systolic function.  3. No myocardial delayed enhancement, so no definitive evidence for prior MI, infiltrative disease, or myocarditis.  4. No evidence on this exam for cardiac sarcoidosis.   ASSESSMENT AND PLAN:  # SVT: No recent episodes of persistent SVT.  She continues to have intermittent palpitations, though much better than in the past.  We will increase metoprolol to 75 mg bid.  She is not interested in ablation.   # Diastolic dysfunction: CPX was suggestive of diastolic dysfunction and her symptoms have improved with metoprolol.  However, her echo only showed grade 1 diastolic function and she does not  clinically appear to have heart failure. Continue metoprolol and losartan.   # Hypertension: BP is great today.   It has been above goal at home but labile.  Continue losartan and HCTZ.  Increase metoprolol to 75 mg bid.  # Sarcoidosis: No evidence of cardiac involvement on MRI 10/2012.  # Chest pain: Resolved with treatment of GERD   Current medicines are reviewed at length with the patient today.  The patient does not have concerns regarding medicines.  The following changes have been made:  Increase metoprolol to 75 mg bid.  Labs/ tests ordered today include: none.  No orders of the defined types were placed in this encounter.    Disposition:   FU with Dr. Elmarie Shiley C. Brice in 6 months    Signed, Chilton Si, MD  01/15/2017 12:26 PM    [facility]

## 2017-01-15 NOTE — Patient Instructions (Signed)
Medication Instructions:  INCREASE YOUR METOPROLOL TO 75 MG TWICE A DAY   Labwork: NONE  Testing/Procedures: NONE  Follow-Up: Your physician wants you to follow-up in: 6 MONTH OV You will receive a reminder letter in the mail two months in advance. If you don't receive a letter, please call our office to schedule the follow-up appointment.  If you need a refill on your cardiac medications before your next appointment, please call your pharmacy.

## 2017-02-08 ENCOUNTER — Other Ambulatory Visit (HOSPITAL_COMMUNITY): Payer: Self-pay | Admitting: *Deleted

## 2017-02-09 ENCOUNTER — Encounter (HOSPITAL_COMMUNITY)
Admission: RE | Admit: 2017-02-09 | Discharge: 2017-02-09 | Disposition: A | Discharge status: Home / Self Care | Payer: Medicare Other | Source: Ambulatory Visit | Attending: Rheumatology | Admitting: Rheumatology

## 2017-02-09 ENCOUNTER — Telehealth: Payer: Self-pay | Admitting: Radiology

## 2017-02-09 DIAGNOSIS — H209 Unspecified iridocyclitis: Secondary | ICD-10-CM | POA: Insufficient documentation

## 2017-02-09 LAB — COMPREHENSIVE METABOLIC PANEL
ALBUMIN: 4.6 g/dL (ref 3.5–5.0)
ALT: 19 U/L (ref 14–54)
AST: 24 U/L (ref 15–41)
Alkaline Phosphatase: 56 U/L (ref 38–126)
Anion gap: 6 (ref 5–15)
BILIRUBIN TOTAL: 0.5 mg/dL (ref 0.3–1.2)
BUN: 15 mg/dL (ref 6–20)
CHLORIDE: 108 mmol/L (ref 101–111)
CO2: 27 mmol/L (ref 22–32)
CREATININE: 0.87 mg/dL (ref 0.44–1.00)
Calcium: 9.4 mg/dL (ref 8.9–10.3)
GFR calc Af Amer: >60 mL/min (ref >60)
GLUCOSE: 125 mg/dL — AB (ref 65–99)
Potassium: 3.8 mmol/L (ref 3.5–5.1)
Sodium: 141 mmol/L (ref 135–145)
TOTAL PROTEIN: 7.3 g/dL (ref 6.5–8.1)

## 2017-02-09 LAB — CBC WITH DIFFERENTIAL/PLATELET
BASOS ABS: 0 10*3/uL (ref 0.0–0.1)
BASOS PCT: 0 %
Eosinophils Absolute: 0.1 10*3/uL (ref 0.0–0.7)
Eosinophils Relative: 2 %
HEMATOCRIT: 40.8 % (ref 36.0–46.0)
Hemoglobin: 14 g/dL (ref 12.0–15.0)
LYMPHS PCT: 48 %
Lymphs Abs: 3 10*3/uL (ref 0.7–4.0)
MCH: 30.4 pg (ref 26.0–34.0)
MCHC: 34.3 g/dL (ref 30.0–36.0)
MCV: 88.5 fL (ref 78.0–100.0)
Monocytes Absolute: 0.2 10*3/uL (ref 0.1–1.0)
Monocytes Relative: 4 %
NEUTROS ABS: 2.8 10*3/uL (ref 1.7–7.7)
NEUTROS PCT: 46 %
Platelets: 333 10*3/uL (ref 150–400)
RBC: 4.61 MIL/uL (ref 3.87–5.11)
RDW: 13.9 % (ref 11.5–15.5)
WBC: 6.1 10*3/uL (ref 4.0–10.5)

## 2017-02-09 MED ORDER — DIPHENHYDRAMINE HCL 25 MG PO CAPS
25.0000 mg | ORAL_CAPSULE | Freq: Once | ORAL | Status: AC
  Administered 2017-02-09: 25 mg via ORAL

## 2017-02-09 MED ORDER — SODIUM CHLORIDE 0.9 % IV SOLN
Freq: Once | INTRAVENOUS | Status: AC
  Administered 2017-02-09: 11:00:00 via INTRAVENOUS

## 2017-02-09 MED ORDER — SODIUM CHLORIDE 0.9 % IV SOLN
3.0000 mg/kg | Freq: Once | INTRAVENOUS | Status: AC
  Administered 2017-02-09: 200 mg via INTRAVENOUS
  Filled 2017-02-09: qty 20

## 2017-02-09 MED ORDER — ACETAMINOPHEN 325 MG PO TABS
650.0000 mg | ORAL_TABLET | Freq: Once | ORAL | Status: AC
  Administered 2017-02-09: 650 mg via ORAL

## 2017-02-09 MED ORDER — DIPHENHYDRAMINE HCL 25 MG PO CAPS
ORAL_CAPSULE | ORAL | Status: AC
  Filled 2017-02-09: qty 1

## 2017-02-09 MED ORDER — ACETAMINOPHEN 325 MG PO TABS
ORAL_TABLET | ORAL | Status: AC
  Filled 2017-02-09: qty 2

## 2017-02-09 NOTE — Telephone Encounter (Signed)
I have called patient to advise labs are normal  

## 2017-02-09 NOTE — Progress Notes (Signed)
Labs normal.

## 2017-02-09 NOTE — Telephone Encounter (Signed)
-----   Message from Pollyann Savoy, MD sent at 02/09/2017 12:17 PM EDT ----- Labs normal

## 2017-02-23 ENCOUNTER — Ambulatory Visit: Payer: Medicare Other | Admitting: Internal Medicine

## 2017-02-23 DIAGNOSIS — Z1211 Encounter for screening for malignant neoplasm of colon: Secondary | ICD-10-CM | POA: Diagnosis not present

## 2017-02-23 DIAGNOSIS — K640 First degree hemorrhoids: Secondary | ICD-10-CM | POA: Diagnosis not present

## 2017-02-24 ENCOUNTER — Encounter: Payer: Self-pay | Admitting: Internal Medicine

## 2017-02-24 ENCOUNTER — Telehealth: Payer: Self-pay | Admitting: Internal Medicine

## 2017-02-24 ENCOUNTER — Ambulatory Visit (INDEPENDENT_AMBULATORY_CARE_PROVIDER_SITE_OTHER): Payer: Medicare Other | Admitting: Internal Medicine

## 2017-02-24 ENCOUNTER — Ambulatory Visit (INDEPENDENT_AMBULATORY_CARE_PROVIDER_SITE_OTHER)
Admission: RE | Admit: 2017-02-24 | Discharge: 2017-02-24 | Disposition: A | Discharge status: Home / Self Care | Payer: Medicare Other | Source: Ambulatory Visit | Attending: Internal Medicine | Admitting: Internal Medicine

## 2017-02-24 DIAGNOSIS — D869 Sarcoidosis, unspecified: Secondary | ICD-10-CM

## 2017-02-24 DIAGNOSIS — I10 Essential (primary) hypertension: Secondary | ICD-10-CM | POA: Diagnosis not present

## 2017-02-24 NOTE — Patient Instructions (Signed)
Sarcoidosis Clinically no evidence of pulmonary sarcoid  Plan cxr 2 view in light of immune suppression  Followup Based on  cxr results; if clear then only as needed

## 2017-02-24 NOTE — Assessment & Plan Note (Addendum)
Clinically no evidence of pulmonary sarcoid  Plan cxr 2 view in light of immune suppression  Followup Based on  cxr results; if clear then only as needed

## 2017-02-24 NOTE — Progress Notes (Signed)
Subjective:     Patient ID: Renee Jones, female   DOB: 03/30/1951, 66 y.o.   MRN: [id number]  HPI    OV 07/19/2015  Chief Complaint  Patient presents with  . Follow-up    Pt here after CPST. Pt states her breathing has improved since last OV. Pt states she was started on Metoprolol and now she does not feel as SOB. Pt denies CP/tightness and cough.     Follow-up dyspnea. She has known pulmonary but ocular sarcoid. Since last visit she tells me she is going to start TNF alpha blockade infusions. Her pulmonary stress test done in mid July 2016 shows a diminished work capacity to 70%. Normal anaerobic threshold. She reached peak heart rate without any heart rate reserve [off note tachycardia is a complaint follow-up]. The stroke volume was flat. This fits in with diastolic dysfunction as a common etiology. The conclusion is that she had circulatory limitation for dyspnea  In the interim review of the chart shows that she saw Dr. Duke Salvia cardiologist. Geronimo Running been started on low-dose Lopressor. She tells me this has helped her dyspnea immensely and she is back to feeling dyspnea free  No other complaints   OV 02/24/2017  Chief Complaint  Patient presents with  . Follow-up    Pt last seen in 2016 and advised to f/u as needed. Pt states Dr. Corliss Skains wanted her to d/u with MR since it has been a while. Pt denies any complaints at this time.     Follow-up sarcoidosis  66 year old female who states that her sarcoidosis started in the pulmonary region with nodules but subsequently pulmonary sarcoidosis went into remission at some point in time in the past. Her main issues with sarcoidosis uveitis and arthropathy that is severe enough that she is now on TNF alpha blockade along with methotrexate again remission. She is followed by Dr. D rheumatologist whom maintains eye immune modulator therapy. I last saw the patient September 2016. Since then she's been in excellent health without any  respiratory complaints or systemic complaints. She feels a sarcoidosis under remission because of her treatment regimen. She's been sent back test to make sure pulmonary issues are normal. She's not had a chest x-ray since January 2016 and she is open to having one particularly in the setting of immunosuppression.   has a past medical history of Allergy; Asthma; Bronchial spasm (09/14/2016); DDD (degenerative disc disease), lumbar (09/14/2016); Detached retina (09/14/2016); Diastolic dysfunction (08/28/2015); Essential hypertension (08/28/2015); GERD (gastroesophageal reflux disease); Glaucoma (09/14/2016); Hypertension; Lumbar disc disease; Neuromuscular disorder (HCC); Osteoarthritis of both hands (09/14/2016); Osteoarthritis of both knees (09/14/2016); Sarcoidosis; Sarcoidosis; SVT (supraventricular tachycardia) (HCC) (08/28/2015); Tachycardia; and Uveitis.   reports that she has never smoked. She has never used smokeless tobacco.  Past Surgical History:  Procedure Laterality Date  . CYSTECTOMY    . LUMBAR SPINE SURGERY    . ROTATOR CUFF REPAIR    . TOTAL ABDOMINAL HYSTERECTOMY      Allergies  Allergen Reactions  . Augmentin [Amoxicillin-Pot Clavulanate]   . Codeine Itching  . Erythromycin Itching  . Sulfa Antibiotics Itching     vomiting  . Tramadol Itching    Immunization History  Administered Date(s) Administered  . DTaP 07/26/2010  . Influenza Split 07/19/2014  . Influenza,inj,Quad PF,36+ Mos 07/19/2016  . Pneumococcal Polysaccharide-23 09/19/2012  . Pneumococcal-Unspecified 07/26/2012, 01/18/2015  . Varicella 09/19/2012  . Zoster 07/26/2012    Family History  Problem Relation Age of Onset  . Adopted: Yes  .  Scleroderma Daughter      Current Outpatient Prescriptions:  .  albuterol (PROVENTIL HFA;VENTOLIN HFA) 108 (90 BASE) MCG/ACT inhaler, Inhale 2 puffs into the lungs every 6 (six) hours as needed for wheezing or shortness of breath., Disp: , Rfl:  .  cholecalciferol  (VITAMIN D) 1000 units tablet, Take 1,000 Units by mouth daily., Disp: , Rfl:  .  fluticasone (FLONASE) 50 MCG/ACT nasal spray, Place 2 sprays into both nostrils daily as needed for allergies or rhinitis., Disp: , Rfl:  .  folic acid (FOLVITE) 1 MG tablet, Take 2 tablets (2 mg total) by mouth daily., Disp: 180 tablet, Rfl: 4 .  hydrochlorothiazide (MICROZIDE) 12.5 MG capsule, Take 1 capsule (12.5 mg total) by mouth daily., Disp: 30 capsule, Rfl: 5 .  InFLIXimab (REMICADE IV), Inject into the vein every 6 (six) weeks., Disp: , Rfl:  .  losartan (COZAAR) 50 MG tablet, Take 50 mg by mouth daily., Disp: , Rfl: 1 .  methotrexate (RHEUMATREX) 2.5 MG tablet, TAKE 10 TABLETS BY MOUTH ONCE A WEEK, Disp: 120 tablet, Rfl: 0 .  metoprolol (LOPRESSOR) 50 MG tablet, 1 AND 1/2 TABLET BY MOUTH TWICE A DAY, Disp: 90 tablet, Rfl: 11 .  Multiple Vitamin (MULTIVITAMIN WITH MINERALS) TABS tablet, Take 1 tablet by mouth daily., Disp: , Rfl:  .  ondansetron (ZOFRAN) 4 MG tablet, Take 4 mg by mouth as needed for nausea or vomiting., Disp: , Rfl:  .  pantoprazole (PROTONIX) 40 MG tablet, Take 40 mg by mouth daily., Disp: , Rfl: 5   Review of Systems     Objective:   Physical Exam  Constitutional: She is oriented to person, place, and time. She appears well-developed and well-nourished. No distress.  HENT:  Head: Normocephalic and atraumatic.  Right Ear: External ear normal.  Left Ear: External ear normal.  Mouth/Throat: Oropharynx is clear and moist. No oropharyngeal exudate.  Eyes: Conjunctivae and EOM are normal. Pupils are equal, round, and reactive to light. Right eye exhibits no discharge. Left eye exhibits no discharge. No scleral icterus.  Neck: Normal range of motion. Neck supple. No JVD present. No tracheal deviation present. No thyromegaly present.  Cardiovascular: Normal rate, regular rhythm, normal heart sounds and intact distal pulses.  Exam reveals no gallop and no friction rub.   No murmur  heard. Pulmonary/Chest: Effort normal and breath sounds normal. No respiratory distress. She has no wheezes. She has no rales. She exhibits no tenderness.  Abdominal: Soft. Bowel sounds are normal. She exhibits no distension and no mass. There is no tenderness. There is no rebound and no guarding.  Musculoskeletal: Normal range of motion. She exhibits no edema or tenderness.  Lymphadenopathy:    She has no cervical adenopathy.  Neurological: She is alert and oriented to person, place, and time. She has normal reflexes. No cranial nerve deficit. She exhibits normal muscle tone. Coordination normal.  Skin: Skin is warm and dry. No rash noted. She is not diaphoretic. No erythema. No pallor.  Psychiatric: She has a normal mood and affect. Her behavior is normal. Judgment and thought content normal.  Vitals reviewed.  Vitals:   02/24/17 1342  BP: 130/70  Pulse: 70  SpO2: 97%  Weight: 138 lb 6.4 oz (62.8 kg)  Height: 5\' 3"  (1.6 m)    Estimated body mass index is 24.52 kg/m as calculated from the following:   Height as of this encounter: 5\' 3"  (1.6 m).   Weight as of this encounter: 138 lb 6.4 oz (62.8  kg).     Assessment:       ICD-9-CM ICD-10-CM   1. Sarcoidosis 135 D86.9 DG Chest 2 View       Plan:     Sarcoidosis Clinically no evidence of pulmonary sarcoid  Plan cxr 2 view in light of immune suppression  Followup Based on  cxr results; if clear then only as needed    Dr. Kalman Shan, M.D., [facility].C.P Pulmonary and Critical Care Medicine Staff Physician [facility]  Pulmonary and Critical Care Pager: [phone], If no answer or between  15:00h - 7:00h: call [phone]  02/24/2017 2:05 PM

## 2017-02-24 NOTE — Telephone Encounter (Signed)
cxr clear - let her know and follow as needed  Dr. Kalman Shan, M.D., [facility].C.P Pulmonary and Critical Care Medicine Staff Physician  [facility] and Critical Care Pager: [phone], If no answer or between  15:00h - 7:00h: call [phone]  02/24/2017 2:40 PM

## 2017-02-25 NOTE — Telephone Encounter (Signed)
LMOMTCB x 1 

## 2017-02-26 NOTE — Telephone Encounter (Signed)
Pt called back and she is aware of results  

## 2017-03-05 ENCOUNTER — Telehealth (INDEPENDENT_AMBULATORY_CARE_PROVIDER_SITE_OTHER): Payer: Self-pay | Admitting: Radiology

## 2017-03-05 NOTE — Telephone Encounter (Signed)
Ok for buy and bill.

## 2017-03-05 NOTE — Telephone Encounter (Signed)
Is this B/B, or P/P? Thank you.

## 2017-03-05 NOTE — Telephone Encounter (Signed)
Please call to schedule Hyalgan injections, bilateral knees, thanks.   Medicare covers 80% and secondary covers 20%.  Ok to schedule.

## 2017-03-09 NOTE — Telephone Encounter (Signed)
Left message on machine for patient to call back to schedule injections.  

## 2017-03-22 ENCOUNTER — Other Ambulatory Visit (HOSPITAL_COMMUNITY): Payer: Self-pay | Admitting: *Deleted

## 2017-03-22 ENCOUNTER — Other Ambulatory Visit: Payer: Self-pay | Admitting: Radiology

## 2017-03-22 ENCOUNTER — Telehealth: Payer: Self-pay | Admitting: Rheumatology

## 2017-03-22 DIAGNOSIS — H209 Unspecified iridocyclitis: Secondary | ICD-10-CM

## 2017-03-22 NOTE — Telephone Encounter (Signed)
Renee from [facility] called this morning and is needing orders for the patient's Iv Remicade,  CB#[phone]

## 2017-03-22 NOTE — Progress Notes (Signed)
Infusion orders updated today including CBC CMP Tylenol and Benadryl appointments are up to date and follow up appointment is scheduled TB gold is due on 07/14/16

## 2017-03-23 ENCOUNTER — Ambulatory Visit (HOSPITAL_COMMUNITY)
Admission: RE | Admit: 2017-03-23 | Discharge: 2017-03-23 | Disposition: A | Discharge status: Home / Self Care | Payer: Medicare Other | Source: Ambulatory Visit | Attending: Rheumatology | Admitting: Rheumatology

## 2017-03-23 DIAGNOSIS — H209 Unspecified iridocyclitis: Secondary | ICD-10-CM | POA: Insufficient documentation

## 2017-03-23 LAB — CBC WITH DIFFERENTIAL/PLATELET
BASOS ABS: 0 10*3/uL (ref 0.0–0.1)
Basophils Relative: 0 %
EOS ABS: 0.1 10*3/uL (ref 0.0–0.7)
EOS PCT: 2 %
HCT: 40.4 % (ref 36.0–46.0)
HEMOGLOBIN: 13.6 g/dL (ref 12.0–15.0)
LYMPHS ABS: 2.5 10*3/uL (ref 0.7–4.0)
LYMPHS PCT: 38 %
MCH: 30.3 pg (ref 26.0–34.0)
MCHC: 33.7 g/dL (ref 30.0–36.0)
MCV: 90 fL (ref 78.0–100.0)
Monocytes Absolute: 0.6 10*3/uL (ref 0.1–1.0)
Monocytes Relative: 9 %
Neutro Abs: 3.3 10*3/uL (ref 1.7–7.7)
Neutrophils Relative %: 51 %
PLATELETS: 360 10*3/uL (ref 150–400)
RBC: 4.49 MIL/uL (ref 3.87–5.11)
RDW: 14.2 % (ref 11.5–15.5)
WBC: 6.6 10*3/uL (ref 4.0–10.5)

## 2017-03-23 LAB — COMPREHENSIVE METABOLIC PANEL
ALK PHOS: 54 U/L (ref 38–126)
ALT: 16 U/L (ref 14–54)
AST: 24 U/L (ref 15–41)
Albumin: 4.2 g/dL (ref 3.5–5.0)
Anion gap: 8 (ref 5–15)
BUN: 15 mg/dL (ref 6–20)
CHLORIDE: 106 mmol/L (ref 101–111)
CO2: 26 mmol/L (ref 22–32)
CREATININE: 0.91 mg/dL (ref 0.44–1.00)
Calcium: 9.1 mg/dL (ref 8.9–10.3)
GFR calc Af Amer: >60 mL/min (ref >60)
GFR calc non Af Amer: >60 mL/min (ref >60)
Glucose, Bld: 129 mg/dL — ABNORMAL HIGH (ref 65–99)
Potassium: 3.8 mmol/L (ref 3.5–5.1)
SODIUM: 140 mmol/L (ref 135–145)
Total Bilirubin: 1.1 mg/dL (ref 0.3–1.2)
Total Protein: 6.7 g/dL (ref 6.5–8.1)

## 2017-03-23 MED ORDER — DIPHENHYDRAMINE HCL 25 MG PO CAPS
ORAL_CAPSULE | ORAL | Status: AC
  Administered 2017-03-23: 25 mg via ORAL
  Filled 2017-03-23: qty 1

## 2017-03-23 MED ORDER — ACETAMINOPHEN 325 MG PO TABS
650.0000 mg | ORAL_TABLET | ORAL | Status: DC
  Administered 2017-03-23: 650 mg via ORAL

## 2017-03-23 MED ORDER — DIPHENHYDRAMINE HCL 25 MG PO CAPS
25.0000 mg | ORAL_CAPSULE | ORAL | Status: DC
  Administered 2017-03-23: 25 mg via ORAL

## 2017-03-23 MED ORDER — ACETAMINOPHEN 325 MG PO TABS
ORAL_TABLET | ORAL | Status: AC
  Administered 2017-03-23: 650 mg via ORAL
  Filled 2017-03-23: qty 2

## 2017-03-23 MED ORDER — SODIUM CHLORIDE 0.9 % IV SOLN
3.0000 mg/kg | INTRAVENOUS | Status: DC
  Administered 2017-03-23: 200 mg via INTRAVENOUS
  Filled 2017-03-23: qty 20

## 2017-03-23 NOTE — Progress Notes (Signed)
WNL. Glu elevated

## 2017-03-26 ENCOUNTER — Telehealth: Payer: Self-pay | Admitting: Radiology

## 2017-03-26 NOTE — Telephone Encounter (Signed)
-----   Message from Pollyann Savoy, MD sent at 03/23/2017 12:36 PM EDT ----- WNL. Glu elevated

## 2017-03-26 NOTE — Telephone Encounter (Signed)
I have called patient to advise labs are normal except for elevation of non fasting glucose

## 2017-04-02 ENCOUNTER — Other Ambulatory Visit: Payer: Self-pay | Admitting: Rheumatology

## 2017-04-02 NOTE — Telephone Encounter (Signed)
ok 

## 2017-04-02 NOTE — Telephone Encounter (Signed)
Last Visit: 03/23/17 Next Visit:06/15/17 Labs: 03/23/17 WNL elevated glucose   Okay to refill MTX?

## 2017-04-27 ENCOUNTER — Other Ambulatory Visit: Payer: Self-pay | Admitting: Radiology

## 2017-04-27 DIAGNOSIS — H209 Unspecified iridocyclitis: Secondary | ICD-10-CM

## 2017-04-29 DIAGNOSIS — E559 Vitamin D deficiency, unspecified: Secondary | ICD-10-CM | POA: Diagnosis not present

## 2017-04-29 DIAGNOSIS — H209 Unspecified iridocyclitis: Secondary | ICD-10-CM | POA: Diagnosis not present

## 2017-04-29 DIAGNOSIS — I10 Essential (primary) hypertension: Secondary | ICD-10-CM | POA: Diagnosis not present

## 2017-04-29 DIAGNOSIS — D869 Sarcoidosis, unspecified: Secondary | ICD-10-CM | POA: Diagnosis not present

## 2017-04-29 DIAGNOSIS — E7800 Pure hypercholesterolemia, unspecified: Secondary | ICD-10-CM | POA: Diagnosis not present

## 2017-04-29 DIAGNOSIS — R7303 Prediabetes: Secondary | ICD-10-CM | POA: Diagnosis not present

## 2017-05-04 ENCOUNTER — Encounter (HOSPITAL_COMMUNITY)
Admission: RE | Admit: 2017-05-04 | Discharge: 2017-05-04 | Disposition: A | Discharge status: Home / Self Care | Payer: Medicare Other | Source: Ambulatory Visit | Attending: Rheumatology | Admitting: Rheumatology

## 2017-05-04 DIAGNOSIS — H209 Unspecified iridocyclitis: Secondary | ICD-10-CM | POA: Diagnosis not present

## 2017-05-04 MED ORDER — DIPHENHYDRAMINE HCL 25 MG PO CAPS
ORAL_CAPSULE | ORAL | Status: AC
  Filled 2017-05-04: qty 1

## 2017-05-04 MED ORDER — DIPHENHYDRAMINE HCL 25 MG PO CAPS
25.0000 mg | ORAL_CAPSULE | ORAL | Status: DC
  Administered 2017-05-04: 25 mg via ORAL

## 2017-05-04 MED ORDER — ACETAMINOPHEN 325 MG PO TABS
650.0000 mg | ORAL_TABLET | ORAL | Status: DC
  Administered 2017-05-04: 650 mg via ORAL

## 2017-05-04 MED ORDER — ACETAMINOPHEN 325 MG PO TABS
ORAL_TABLET | ORAL | Status: AC
  Filled 2017-05-04: qty 2

## 2017-05-04 MED ORDER — SODIUM CHLORIDE 0.9 % IV SOLN
3.0000 mg/kg | INTRAVENOUS | Status: DC
  Administered 2017-05-04: 200 mg via INTRAVENOUS
  Filled 2017-05-04: qty 20

## 2017-05-25 ENCOUNTER — Other Ambulatory Visit: Payer: Self-pay | Admitting: Radiology

## 2017-05-25 DIAGNOSIS — H18411 Arcus senilis, right eye: Secondary | ICD-10-CM | POA: Diagnosis not present

## 2017-05-25 DIAGNOSIS — H02839 Dermatochalasis of unspecified eye, unspecified eyelid: Secondary | ICD-10-CM | POA: Diagnosis not present

## 2017-05-25 DIAGNOSIS — H18412 Arcus senilis, left eye: Secondary | ICD-10-CM | POA: Diagnosis not present

## 2017-05-25 DIAGNOSIS — H2513 Age-related nuclear cataract, bilateral: Secondary | ICD-10-CM | POA: Diagnosis not present

## 2017-05-25 DIAGNOSIS — H2511 Age-related nuclear cataract, right eye: Secondary | ICD-10-CM | POA: Diagnosis not present

## 2017-05-25 DIAGNOSIS — H209 Unspecified iridocyclitis: Secondary | ICD-10-CM

## 2017-05-25 NOTE — Progress Notes (Signed)
06/15/2017 next visit  Infusion orders updated today including Tylenol and Benadryl appointments are up to date and a follow up appointment is scheduled/ TB gold is due/ so it was also ordered   CBC CMP were still active from July

## 2017-05-27 NOTE — Progress Notes (Signed)
   Procedure Note  Patient: Renee Jones             Date of Birth: 12-Oct-1951           MRN: [id number]             Visit Date: 06/02/2017  Procedures: Visit Diagnoses: Primary osteoarthritis of both knees - Plan: Large Joint Injection/Arthrocentesis, Large Joint Injection/Arthrocentesis  Bilateral knees Hyalgan #1 Large Joint Inj Date/Time: 06/02/2017 2:59 PM Performed by: Pollyann Savoy Authorized by: Pollyann Savoy   Consent Given by:  Patient Site marked: the procedure site was marked   Timeout: prior to procedure the correct patient, procedure, and site was verified   Indications:  Pain Location:  Knee Site:  L knee Prep: patient was prepped and draped in usual sterile fashion   Needle Size:  27 G Needle Length:  1.5 inches Ultrasound Guidance: No   Fluoroscopic Guidance: No   Arthrogram: No   Medications:  20 mg Sodium Hyaluronate 20 MG/2ML; 1.5 mL lidocaine 1 % Aspiration Attempted: Yes   Aspirate amount (mL):  0 Patient tolerance:  Patient tolerated the procedure well with no immediate complications Large Joint Inj Date/Time: 06/02/2017 3:00 PM Performed by: Pollyann Savoy Authorized by: Pollyann Savoy   Consent Given by:  Patient Site marked: the procedure site was marked   Timeout: prior to procedure the correct patient, procedure, and site was verified   Indications:  Pain Location:  Knee Site:  R knee Prep: patient was prepped and draped in usual sterile fashion   Needle Size:  27 G Needle Length:  1.5 inches Ultrasound Guidance: No   Fluoroscopic Guidance: No   Arthrogram: No   Medications:  20 mg Sodium Hyaluronate 20 MG/2ML; 1.5 mL lidocaine 1 % Aspiration Attempted: Yes   Aspirate amount (mL):  0 Patient tolerance:  Patient tolerated the procedure well with no immediate complications   Pollyann Savoy, MD

## 2017-06-02 ENCOUNTER — Ambulatory Visit (INDEPENDENT_AMBULATORY_CARE_PROVIDER_SITE_OTHER): Payer: Medicare Other | Admitting: Rheumatology

## 2017-06-02 ENCOUNTER — Ambulatory Visit: Payer: PRIVATE HEALTH INSURANCE | Admitting: Rheumatology

## 2017-06-02 DIAGNOSIS — M170 Bilateral primary osteoarthritis of knee: Secondary | ICD-10-CM

## 2017-06-02 MED ORDER — SODIUM HYALURONATE (VISCOSUP) 20 MG/2ML IX SOSY
20.0000 mg | PREFILLED_SYRINGE | INTRA_ARTICULAR | Status: AC | PRN
  Administered 2017-06-02: 20 mg via INTRA_ARTICULAR

## 2017-06-02 MED ORDER — LIDOCAINE HCL 1 % IJ SOLN
1.5000 mL | INTRAMUSCULAR | Status: AC | PRN
  Administered 2017-06-02: 1.5 mL

## 2017-06-09 ENCOUNTER — Ambulatory Visit: Payer: PRIVATE HEALTH INSURANCE | Admitting: Rheumatology

## 2017-06-09 ENCOUNTER — Encounter: Payer: Self-pay | Admitting: Rheumatology

## 2017-06-09 ENCOUNTER — Ambulatory Visit (INDEPENDENT_AMBULATORY_CARE_PROVIDER_SITE_OTHER): Payer: Medicare Other | Admitting: Rheumatology

## 2017-06-09 VITALS — BP 129/73 | HR 65 | Resp 14 | Wt 140.0 lb

## 2017-06-09 DIAGNOSIS — G8929 Other chronic pain: Secondary | ICD-10-CM | POA: Diagnosis not present

## 2017-06-09 DIAGNOSIS — M170 Bilateral primary osteoarthritis of knee: Secondary | ICD-10-CM | POA: Diagnosis not present

## 2017-06-09 DIAGNOSIS — H209 Unspecified iridocyclitis: Secondary | ICD-10-CM

## 2017-06-09 DIAGNOSIS — D8686 Sarcoid arthropathy: Secondary | ICD-10-CM

## 2017-06-09 DIAGNOSIS — Z79899 Other long term (current) drug therapy: Secondary | ICD-10-CM

## 2017-06-09 DIAGNOSIS — M25561 Pain in right knee: Secondary | ICD-10-CM | POA: Diagnosis not present

## 2017-06-09 DIAGNOSIS — M25562 Pain in left knee: Secondary | ICD-10-CM | POA: Diagnosis not present

## 2017-06-09 MED ORDER — LIDOCAINE HCL 1 % IJ SOLN
1.5000 mL | INTRAMUSCULAR | Status: AC | PRN
  Administered 2017-06-09: 1.5 mL

## 2017-06-09 MED ORDER — METHOTREXATE 2.5 MG PO TABS: 25.0000 mg | ORAL_TABLET | ORAL | 0 refills | Status: DC

## 2017-06-09 MED ORDER — SODIUM HYALURONATE (VISCOSUP) 20 MG/2ML IX SOSY
20.0000 mg | PREFILLED_SYRINGE | INTRA_ARTICULAR | Status: AC | PRN
  Administered 2017-06-09: 20 mg via INTRA_ARTICULAR

## 2017-06-09 MED ORDER — FOLIC ACID 1 MG PO TABS: 2.0000 mg | ORAL_TABLET | Freq: Every day | ORAL | 4 refills | Status: DC

## 2017-06-09 NOTE — Progress Notes (Signed)
Office Visit Note  Patient: Renee Jones             Date of Birth: 01/28/1951           MRN: [id number]             PCP: Aretta Nip, MD Referring: Aretta Nip, MD Visit Date: 06/09/2017 Occupation: _0 @    Subjective:  Medication Management   History of Present Illness: Renee Jones is a 66 y.o. female  Was last seen in our office May 2018 for sarcoidosis and high-risk prescription. Currently patient is taking Remicade infusions every 6 weeks She's taking methotrexate 10 pills every week Folic acid 2 mg every day. Adequate response. No problem. She also reports that she has not had a flare for uveitis. No shortness of breath.  Last visit with pulmonologist, ramaswamy, was about may 2018.  Exam w/ pulmonologist was fine.  Patient reports that she is having cataract surgery approximately September. They will do one eye and then if that all goes well built of the opposite eye.  Last TB gold was July 14 2016; patient is getting Remicade infusion next week and will get TB gold as well as her CBC with differential and CMP with GFR at that time.  Activities of Daily Living:  Patient reports morning stiffness for 10 minutes.   Patient Reports nocturnal pain.  Difficulty dressing/grooming: Denies Difficulty climbing stairs: Reports Difficulty getting out of chair: Reports Difficulty using hands for taps, buttons, cutlery, and/or writing: Reports   Review of Systems  Constitutional: Negative for fatigue.  HENT: Negative for mouth sores and mouth dryness.   Eyes: Negative for redness and dryness.  Respiratory: Negative for shortness of breath.   Gastrointestinal: Negative for constipation and diarrhea.  Musculoskeletal: Negative for myalgias and myalgias.  Skin: Negative for sensitivity to sunlight.  Psychiatric/Behavioral: Negative for decreased concentration and sleep disturbance.    PMFS History:  Patient Active Problem List     Diagnosis Date Noted  . High risk medications (not anticoagulants) long-term use 01/13/2017  . GERD (gastroesophageal reflux disease) 09/14/2016  . Bronchial spasm 09/14/2016  . Glaucoma 09/14/2016  . Detached retina 09/14/2016  . Osteoarthritis of both hands 09/14/2016  . Osteoarthritis of both knees 09/14/2016  . DDD (degenerative disc disease), lumbar 09/14/2016  . Uveitis 06/22/2016  . SVT (supraventricular tachycardia) ([place]) 08/28/2015  . Essential hypertension 08/28/2015  . Diastolic dysfunction 53/64/6803  . Sarcoidosis 09/30/2012  . Pulmonary nodules 09/30/2012  . Tachycardia 09/30/2012  . Sarcoid arthropathy 09/30/2012    Past Medical History:  Diagnosis Date  . Allergy   . Asthma   . Bronchial spasm 09/14/2016  . DDD (degenerative disc disease), lumbar 09/14/2016  . Detached retina 09/14/2016  . Diastolic dysfunction 21/11/2480  . Essential hypertension 08/28/2015  . GERD (gastroesophageal reflux disease)   . Glaucoma 09/14/2016  . Hypertension   . Lumbar disc disease   . Neuromuscular disorder ([place])   . Osteoarthritis of both hands 09/14/2016  . Osteoarthritis of both knees 09/14/2016  . Sarcoidosis   . Sarcoidosis    with +lymph node biopsy  . SVT (supraventricular tachycardia) ([place]) 08/28/2015  . Tachycardia   . Uveitis     Family History  Problem Relation Age of Onset  . Adopted: Yes  . Scleroderma Daughter    Past Surgical History:  Procedure Laterality Date  . CYSTECTOMY    . LUMBAR SPINE SURGERY    . ROTATOR CUFF REPAIR    .  TOTAL ABDOMINAL HYSTERECTOMY     Social History   Social History Narrative  . No narrative on file     Objective: Vital Signs: BP 129/73   Pulse 65   Resp 14   Wt 140 lb (63.5 kg)   BMI 24.80 kg/m    Physical Exam  Constitutional: She is oriented to person, place, and time. She appears well-developed and well-nourished.  HENT:  Head: Normocephalic and atraumatic.  Eyes: Pupils are equal, round, and reactive  to light. EOM are normal.  Cardiovascular: Normal rate, regular rhythm and normal heart sounds.  Exam reveals no gallop and no friction rub.   No murmur heard. Pulmonary/Chest: Effort normal and breath sounds normal. She has no wheezes. She has no rales.  Abdominal: Soft. Bowel sounds are normal. She exhibits no distension. There is no tenderness. There is no guarding. No hernia.  Musculoskeletal: Normal range of motion. She exhibits no edema, tenderness or deformity.  Lymphadenopathy:    She has no cervical adenopathy.  Neurological: She is alert and oriented to person, place, and time. Coordination normal.  Skin: Skin is warm and dry. Capillary refill takes less than 2 seconds. No rash noted.  Psychiatric: She has a normal mood and affect. Her behavior is normal.  Nursing note and vitals reviewed.    Musculoskeletal Exam:  Full range of motion of all joints Grip strength is equal and strong bilaterally Fiber myalgia points are all absent  CDAI Exam: CDAI Homunculus Exam:   Joint Counts:  CDAI Tender Joint count: 0 CDAI Swollen Joint count: 0  Global Assessments:  Patient Global Assessment: 4 Provider Global Assessment: 4  CDAI Calculated Score: 8    Investigation: No additional findings. Hospital Outpatient Visit on 03/23/2017  Component Date Value Ref Range Status  . WBC 03/23/2017 6.6  4.0 - 10.5 K/uL Final  . RBC 03/23/2017 4.49  3.87 - 5.11 MIL/uL Final  . Hemoglobin 03/23/2017 13.6  12.0 - 15.0 g/dL Final  . HCT 03/23/2017 40.4  36.0 - 46.0 % Final  . MCV 03/23/2017 90.0  78.0 - 100.0 fL Final  . MCH 03/23/2017 30.3  26.0 - 34.0 pg Final  . MCHC 03/23/2017 33.7  30.0 - 36.0 g/dL Final  . RDW 03/23/2017 14.2  11.5 - 15.5 % Final  . Platelets 03/23/2017 360  150 - 400 K/uL Final  . Neutrophils Relative % 03/23/2017 51  % Final  . Neutro Abs 03/23/2017 3.3  1.7 - 7.7 K/uL Final  . Lymphocytes Relative 03/23/2017 38  % Final  . Lymphs Abs 03/23/2017 2.5  0.7 - 4.0  K/uL Final  . Monocytes Relative 03/23/2017 9  % Final  . Monocytes Absolute 03/23/2017 0.6  0.1 - 1.0 K/uL Final  . Eosinophils Relative 03/23/2017 2  % Final  . Eosinophils Absolute 03/23/2017 0.1  0.0 - 0.7 K/uL Final  . Basophils Relative 03/23/2017 0  % Final  . Basophils Absolute 03/23/2017 0.0  0.0 - 0.1 K/uL Final  . Sodium 03/23/2017 140  135 - 145 mmol/L Final  . Potassium 03/23/2017 3.8  3.5 - 5.1 mmol/L Final  . Chloride 03/23/2017 106  101 - 111 mmol/L Final  . CO2 03/23/2017 26  22 - 32 mmol/L Final  . Glucose, Bld 03/23/2017 129* 65 - 99 mg/dL Final  . BUN 03/23/2017 15  6 - 20 mg/dL Final  . Creatinine, Ser 03/23/2017 0.91  0.44 - 1.00 mg/dL Final  . Calcium 03/23/2017 9.1  8.9 - 10.3 mg/dL  Final  . Total Protein 03/23/2017 6.7  6.5 - 8.1 g/dL Final  . Albumin 03/23/2017 4.2  3.5 - 5.0 g/dL Final  . AST 03/23/2017 24  15 - 41 U/L Final  . ALT 03/23/2017 16  14 - 54 U/L Final  . Alkaline Phosphatase 03/23/2017 54  38 - 126 U/L Final  . Total Bilirubin 03/23/2017 1.1  0.3 - 1.2 mg/dL Final  . GFR calc non Af Amer 03/23/2017 >60  >60 mL/min Final  . GFR calc Af Amer 03/23/2017 >60  >60 mL/min Final   Comment: (NOTE) The eGFR has been calculated using the CKD EPI equation. This calculation has not been validated in all clinical situations. eGFR's persistently <60 mL/min signify possible Chronic Kidney Disease.   Georgiann Hahn gap 03/23/2017 8  5 - 15 Final  Hospital Outpatient Visit on 02/09/2017  Component Date Value Ref Range Status  . WBC 02/09/2017 6.1  4.0 - 10.5 K/uL Final  . RBC 02/09/2017 4.61  3.87 - 5.11 MIL/uL Final  . Hemoglobin 02/09/2017 14.0  12.0 - 15.0 g/dL Final  . HCT 02/09/2017 40.8  36.0 - 46.0 % Final  . MCV 02/09/2017 88.5  78.0 - 100.0 fL Final  . MCH 02/09/2017 30.4  26.0 - 34.0 pg Final  . MCHC 02/09/2017 34.3  30.0 - 36.0 g/dL Final  . RDW 02/09/2017 13.9  11.5 - 15.5 % Final  . Platelets 02/09/2017 333  150 - 400 K/uL Final  . Neutrophils  Relative % 02/09/2017 46  % Final  . Neutro Abs 02/09/2017 2.8  1.7 - 7.7 K/uL Final  . Lymphocytes Relative 02/09/2017 48  % Final  . Lymphs Abs 02/09/2017 3.0  0.7 - 4.0 K/uL Final  . Monocytes Relative 02/09/2017 4  % Final  . Monocytes Absolute 02/09/2017 0.2  0.1 - 1.0 K/uL Final  . Eosinophils Relative 02/09/2017 2  % Final  . Eosinophils Absolute 02/09/2017 0.1  0.0 - 0.7 K/uL Final  . Basophils Relative 02/09/2017 0  % Final  . Basophils Absolute 02/09/2017 0.0  0.0 - 0.1 K/uL Final  . Sodium 02/09/2017 141  135 - 145 mmol/L Final  . Potassium 02/09/2017 3.8  3.5 - 5.1 mmol/L Final  . Chloride 02/09/2017 108  101 - 111 mmol/L Final  . CO2 02/09/2017 27  22 - 32 mmol/L Final  . Glucose, Bld 02/09/2017 125* 65 - 99 mg/dL Final  . BUN 02/09/2017 15  6 - 20 mg/dL Final  . Creatinine, Ser 02/09/2017 0.87  0.44 - 1.00 mg/dL Final  . Calcium 02/09/2017 9.4  8.9 - 10.3 mg/dL Final  . Total Protein 02/09/2017 7.3  6.5 - 8.1 g/dL Final  . Albumin 02/09/2017 4.6  3.5 - 5.0 g/dL Final  . AST 02/09/2017 24  15 - 41 U/L Final  . ALT 02/09/2017 19  14 - 54 U/L Final  . Alkaline Phosphatase 02/09/2017 56  38 - 126 U/L Final  . Total Bilirubin 02/09/2017 0.5  0.3 - 1.2 mg/dL Final  . GFR calc non Af Amer 02/09/2017 >60  >60 mL/min Final  . GFR calc Af Amer 02/09/2017 >60  >60 mL/min Final   Comment: (NOTE) The eGFR has been calculated using the CKD EPI equation. This calculation has not been validated in all clinical situations. eGFR's persistently <60 mL/min signify possible Chronic Kidney Disease.   . Anion gap 02/09/2017 6  5 - 15 Final     Imaging: No results found.  Speciality Comments: No specialty  comments available.    Procedures:  Large Joint Inj Date/Time: 06/09/2017 4:22 PM Performed by: Eliezer Lofts Authorized by: Eliezer Lofts   Consent Given by:  Patient Site marked: the procedure site was marked   Timeout: prior to procedure the correct patient, procedure,  and site was verified   Indications:  Pain and joint swelling Location:  Knee Site:  R knee Prep: patient was prepped and draped in usual sterile fashion   Needle Size:  27 G Needle Length:  1.5 inches Approach:  Medial Ultrasound Guidance: No   Fluoroscopic Guidance: No   Arthrogram: No   Medications:  20 mg Sodium Hyaluronate 20 MG/2ML; 1.5 mL lidocaine 1 % Aspiration Attempted: Yes   Aspirate amount (mL):  0 Patient tolerance:  Patient tolerated the procedure well with no immediate complications  Large Joint Inj Date/Time: 06/09/2017 4:49 PM Performed by: Eliezer Lofts Authorized by: Eliezer Lofts   Consent Given by:  Patient Site marked: the procedure site was marked   Timeout: prior to procedure the correct patient, procedure, and site was verified   Indications:  Pain and joint swelling Location:  Knee Site:  L knee Prep: patient was prepped and draped in usual sterile fashion   Needle Size:  27 G Needle Length:  1.5 inches Approach:  Medial Ultrasound Guidance: No   Fluoroscopic Guidance: No   Arthrogram: No   Medications:  1.5 mL lidocaine 1 %; 20 mg Sodium Hyaluronate 20 MG/2ML Aspiration Attempted: Yes   Aspirate amount (mL):  0 Patient tolerance:  Patient tolerated the procedure well with no immediate complications   Allergies: Augmentin [amoxicillin-pot clavulanate]; Codeine; Erythromycin; Sulfa antibiotics; and Tramadol   Assessment / Plan:     Visit Diagnoses: Sarcoid arthropathy  Uveitis  High risk medications (not anticoagulants) long-term use  Primary osteoarthritis of both knees  Bilateral chronic knee pain   Plan: #1: Sarcoidosis. No flare. Doing well. No shortness of breath; saw Dr. Chase Caller in May 2018 and sarcoidosis was normal according to the patient.  #2: High risk prescription Remicade infusion every 6 weeks And methotrexate 10 pills per week Folic acid 2 mg daily. Adequate response. No flare.  #3: Uveitis. No  flare.  #4: OA of bilateral knees. Today, patient will be getting her second Hyalgan injections. Buy and Bill Hyalgan.  #5: Return to clinic next week for Hyalgan No. 3 both knees  #6: Return to clinic in 5 months for sarcoidosis follow-up.   Orders: Orders Placed This Encounter  Procedures  . Large Joint Injection/Arthrocentesis   Meds ordered this encounter  Medications  . methotrexate (RHEUMATREX) 2.5 MG tablet    Sig: Take 10 tablets (25 mg total) by mouth once a week. Caution:Chemotherapy. Protect from light.    Dispense:  120 tablet    Refill:  0    Order Specific Question:   Supervising Provider    Answer:   Bo Merino [[id number]]  . folic acid (FOLVITE) 1 MG tablet    Sig: Take 2 tablets (2 mg total) by mouth daily.    Dispense:  180 tablet    Refill:  4    Order Specific Question:   Supervising Provider    Answer:   Bo Merino [phone]    Face-to-face time spent with patient was 40 minutes. 50% of time was spent in counseling and coordination of care.  Follow-Up Instructions: No Follow-up on file.   Eliezer Lofts, PA-C  Note - This record has been created using [employer].  Chart  creation errors have been sought, but may not always  have been located. Such creation errors do not reflect on  the standard of medical care.

## 2017-06-15 ENCOUNTER — Ambulatory Visit: Payer: Medicare Other | Admitting: Rheumatology

## 2017-06-15 ENCOUNTER — Other Ambulatory Visit (HOSPITAL_COMMUNITY): Payer: Self-pay | Admitting: *Deleted

## 2017-06-15 ENCOUNTER — Encounter (HOSPITAL_COMMUNITY): Payer: Medicare Other

## 2017-06-16 ENCOUNTER — Ambulatory Visit: Payer: PRIVATE HEALTH INSURANCE | Admitting: Rheumatology

## 2017-06-16 ENCOUNTER — Encounter (HOSPITAL_COMMUNITY)
Admission: RE | Admit: 2017-06-16 | Discharge: 2017-06-16 | Disposition: A | Discharge status: Home / Self Care | Payer: Medicare Other | Source: Ambulatory Visit | Attending: Rheumatology | Admitting: Rheumatology

## 2017-06-16 ENCOUNTER — Ambulatory Visit (INDEPENDENT_AMBULATORY_CARE_PROVIDER_SITE_OTHER): Payer: Medicare Other | Admitting: Rheumatology

## 2017-06-16 DIAGNOSIS — M170 Bilateral primary osteoarthritis of knee: Secondary | ICD-10-CM

## 2017-06-16 DIAGNOSIS — H209 Unspecified iridocyclitis: Secondary | ICD-10-CM

## 2017-06-16 LAB — COMPREHENSIVE METABOLIC PANEL
ALK PHOS: 53 U/L (ref 38–126)
ALT: 21 U/L (ref 14–54)
AST: 28 U/L (ref 15–41)
Albumin: 4.4 g/dL (ref 3.5–5.0)
Anion gap: 7 (ref 5–15)
BILIRUBIN TOTAL: 1 mg/dL (ref 0.3–1.2)
BUN: 14 mg/dL (ref 6–20)
CALCIUM: 9.4 mg/dL (ref 8.9–10.3)
CO2: 24 mmol/L (ref 22–32)
Chloride: 109 mmol/L (ref 101–111)
Creatinine, Ser: 0.83 mg/dL (ref 0.44–1.00)
GFR calc Af Amer: >60 mL/min (ref >60)
Glucose, Bld: 109 mg/dL — ABNORMAL HIGH (ref 65–99)
Potassium: 4.4 mmol/L (ref 3.5–5.1)
Sodium: 140 mmol/L (ref 135–145)
TOTAL PROTEIN: 7.1 g/dL (ref 6.5–8.1)

## 2017-06-16 LAB — CBC WITH DIFFERENTIAL/PLATELET
Basophils Absolute: 0 10*3/uL (ref 0.0–0.1)
Basophils Relative: 0 %
EOS PCT: 2 %
Eosinophils Absolute: 0.1 10*3/uL (ref 0.0–0.7)
HCT: 41.1 % (ref 36.0–46.0)
Hemoglobin: 14.2 g/dL (ref 12.0–15.0)
LYMPHS ABS: 3.2 10*3/uL (ref 0.7–4.0)
Lymphocytes Relative: 44 %
MCH: 30.7 pg (ref 26.0–34.0)
MCHC: 34.5 g/dL (ref 30.0–36.0)
MCV: 88.8 fL (ref 78.0–100.0)
Monocytes Absolute: 0.3 10*3/uL (ref 0.1–1.0)
Monocytes Relative: 4 %
Neutro Abs: 3.6 10*3/uL (ref 1.7–7.7)
Neutrophils Relative %: 50 %
Platelets: 300 10*3/uL (ref 150–400)
RBC: 4.63 MIL/uL (ref 3.87–5.11)
RDW: 13.7 % (ref 11.5–15.5)
WBC: 7.2 10*3/uL (ref 4.0–10.5)

## 2017-06-16 MED ORDER — DIPHENHYDRAMINE HCL 25 MG PO CAPS
25.0000 mg | ORAL_CAPSULE | ORAL | Status: DC
  Administered 2017-06-16: 25 mg via ORAL

## 2017-06-16 MED ORDER — LIDOCAINE HCL 1 % IJ SOLN
1.5000 mL | INTRAMUSCULAR | Status: AC | PRN
  Administered 2017-06-16: 1.5 mL

## 2017-06-16 MED ORDER — ACETAMINOPHEN 325 MG PO TABS
ORAL_TABLET | ORAL | Status: AC
  Filled 2017-06-16: qty 2

## 2017-06-16 MED ORDER — SODIUM HYALURONATE (VISCOSUP) 20 MG/2ML IX SOSY
20.0000 mg | PREFILLED_SYRINGE | INTRA_ARTICULAR | Status: AC | PRN
  Administered 2017-06-16: 20 mg via INTRA_ARTICULAR

## 2017-06-16 MED ORDER — ACETAMINOPHEN 325 MG PO TABS
650.0000 mg | ORAL_TABLET | ORAL | Status: DC
  Administered 2017-06-16: 650 mg via ORAL

## 2017-06-16 MED ORDER — DIPHENHYDRAMINE HCL 25 MG PO CAPS
ORAL_CAPSULE | ORAL | Status: AC
  Filled 2017-06-16: qty 1

## 2017-06-16 MED ORDER — SODIUM CHLORIDE 0.9 % IV SOLN
3.0000 mg/kg | INTRAVENOUS | Status: DC
  Administered 2017-06-16: 200 mg via INTRAVENOUS
  Filled 2017-06-16: qty 20

## 2017-06-16 NOTE — Progress Notes (Signed)
   Procedure Note  Patient: Renee Jones             Date of Birth: [birth year]           MRN: [id number]             Visit Date: 06/16/2017  Procedures: Visit Diagnoses: Primary osteoarthritis of both knees - Plan: Large Joint Injection/Arthrocentesis, Large Joint Injection/Arthrocentesis Hyalgan #3 bilateral knees  Large Joint Inj Date/Time: 06/16/2017 3:40 PM Performed by: Pollyann Savoy Authorized by: Pollyann Savoy   Consent Given by:  Patient Site marked: the procedure site was marked   Timeout: prior to procedure the correct patient, procedure, and site was verified   Indications:  Pain Location:  Knee Site:  R knee Prep: patient was prepped and draped in usual sterile fashion   Needle Size:  27 G Needle Length:  1.5 inches Ultrasound Guidance: No   Fluoroscopic Guidance: No   Arthrogram: No   Medications:  1.5 mL lidocaine 1 %; 20 mg Sodium Hyaluronate 20 MG/2ML Aspiration Attempted: Yes   Patient tolerance:  Patient tolerated the procedure well with no immediate complications Large Joint Inj Date/Time: 06/16/2017 3:40 PM Performed by: Pollyann Savoy Authorized by: Pollyann Savoy   Consent Given by:  Patient Site marked: the procedure site was marked   Timeout: prior to procedure the correct patient, procedure, and site was verified   Indications:  Pain Location:  Knee Site:  L knee Prep: patient was prepped and draped in usual sterile fashion   Needle Size:  27 G Needle Length:  1.5 inches Ultrasound Guidance: No   Fluoroscopic Guidance: No   Arthrogram: No   Medications:  1.5 mL lidocaine 1 %; 20 mg Sodium Hyaluronate 20 MG/2ML Aspiration Attempted: Yes   Patient tolerance:  Patient tolerated the procedure well with no immediate complications   Pollyann Savoy, MD

## 2017-06-18 NOTE — Progress Notes (Signed)
   Procedure Note  Patient: Renee Jones             Date of Birth: 01-09-1951           MRN: [id number]             Visit Date: 06/23/2017  Procedures: Visit Diagnoses: Primary osteoarthritis of both knees - Plan: Large Joint Injection/Arthrocentesis, Large Joint Injection/Arthrocentesis Hyalgan #4 bilateral knees  Large Joint Inj Date/Time: 06/23/2017 1:59 PM Performed by: Pollyann Savoy Authorized by: Pollyann Savoy   Consent Given by:  Patient Site marked: the procedure site was marked   Timeout: prior to procedure the correct patient, procedure, and site was verified   Indications:  Pain Location:  Knee Site:  R knee Prep: patient was prepped and draped in usual sterile fashion   Needle Size:  27 G Needle Length:  1.5 inches Ultrasound Guidance: No   Fluoroscopic Guidance: No   Arthrogram: No   Medications:  1.5 mL lidocaine 1 %; 20 mg Sodium Hyaluronate 20 MG/2ML Aspiration Attempted: Yes   Patient tolerance:  Patient tolerated the procedure well with no immediate complications Large Joint Inj Date/Time: 06/23/2017 2:00 PM Performed by: Pollyann Savoy Authorized by: Pollyann Savoy   Consent Given by:  Patient Site marked: the procedure site was marked   Timeout: prior to procedure the correct patient, procedure, and site was verified   Indications:  Pain Location:  Knee Site:  L knee Prep: patient was prepped and draped in usual sterile fashion   Needle Size:  27 G Needle Length:  1.5 inches Ultrasound Guidance: No   Fluoroscopic Guidance: No   Arthrogram: No   Medications:  1.5 mL lidocaine 1 %; 20 mg Sodium Hyaluronate 20 MG/2ML Aspiration Attempted: Yes   Patient tolerance:  Patient tolerated the procedure well with no immediate complications   Pollyann Savoy, MD

## 2017-06-21 LAB — QUANTIFERON IN TUBE
QFT TB AG MINUS NIL VALUE: <0 IU/mL
QUANTIFERON MITOGEN VALUE: >10 IU/mL
QUANTIFERON NIL VALUE: 0.06 [IU]/mL
QUANTIFERON TB AG VALUE: 0.05 IU/mL
QUANTIFERON TB GOLD: NEGATIVE

## 2017-06-21 LAB — QUANTIFERON TB GOLD ASSAY (BLOOD)

## 2017-06-22 NOTE — Progress Notes (Signed)
WNL is stressed 64

## 2017-06-23 ENCOUNTER — Ambulatory Visit (INDEPENDENT_AMBULATORY_CARE_PROVIDER_SITE_OTHER): Payer: Medicare Other | Admitting: Rheumatology

## 2017-06-23 ENCOUNTER — Ambulatory Visit: Payer: PRIVATE HEALTH INSURANCE | Admitting: Rheumatology

## 2017-06-23 DIAGNOSIS — M170 Bilateral primary osteoarthritis of knee: Secondary | ICD-10-CM | POA: Diagnosis not present

## 2017-06-23 MED ORDER — SODIUM HYALURONATE (VISCOSUP) 20 MG/2ML IX SOSY
20.0000 mg | PREFILLED_SYRINGE | INTRA_ARTICULAR | Status: AC | PRN
  Administered 2017-06-23: 20 mg via INTRA_ARTICULAR

## 2017-06-23 MED ORDER — LIDOCAINE HCL 1 % IJ SOLN
1.5000 mL | INTRAMUSCULAR | Status: AC | PRN
  Administered 2017-06-23: 1.5 mL

## 2017-06-23 MED ORDER — LIDOCAINE HCL 1 % IJ SOLN
1.5000 mL | INTRAMUSCULAR | Status: AC | PRN
Start: 2017-06-23 — End: 2017-06-23
  Administered 2017-06-23: 1.5 mL

## 2017-06-30 ENCOUNTER — Ambulatory Visit (INDEPENDENT_AMBULATORY_CARE_PROVIDER_SITE_OTHER): Payer: Medicare Other | Admitting: Rheumatology

## 2017-06-30 DIAGNOSIS — M170 Bilateral primary osteoarthritis of knee: Secondary | ICD-10-CM

## 2017-06-30 MED ORDER — SODIUM HYALURONATE (VISCOSUP) 20 MG/2ML IX SOSY
20.0000 mg | PREFILLED_SYRINGE | INTRA_ARTICULAR | Status: AC | PRN
  Administered 2017-06-30: 20 mg via INTRA_ARTICULAR

## 2017-06-30 MED ORDER — LIDOCAINE HCL 1 % IJ SOLN
1.5000 mL | INTRAMUSCULAR | Status: AC | PRN
Start: 2017-06-30 — End: 2017-06-30
  Administered 2017-06-30: 1.5 mL

## 2017-06-30 NOTE — Progress Notes (Signed)
   Procedure Note  Patient: Renee Jones             Date of Birth: 22-Aug-1951           MRN: [id number]             Visit Date: 06/30/2017  Procedures: Visit Diagnoses: Primary osteoarthritis of both knees Hyalgan # 5 bilateral knee joints B/B Large Joint Inj Date/Time: 06/30/2017 4:33 PM Performed by: Pollyann Savoy Authorized by: Pollyann Savoy   Consent Given by:  Patient Site marked: the procedure site was marked   Timeout: prior to procedure the correct patient, procedure, and site was verified   Indications:  Pain and joint swelling Location:  Knee Site:  R knee Prep: patient was prepped and draped in usual sterile fashion   Needle Size:  27 G Needle Length:  1.5 inches Approach:  Medial Ultrasound Guidance: No   Fluoroscopic Guidance: No   Arthrogram: No   Medications:  1.5 mL lidocaine 1 %; 20 mg Sodium Hyaluronate 20 MG/2ML Aspiration Attempted: Yes   Aspirate amount (mL):  0 Patient tolerance:  Patient tolerated the procedure well with no immediate complications Large Joint Inj Date/Time: 06/30/2017 4:34 PM Performed by: Pollyann Savoy Authorized by: Pollyann Savoy   Consent Given by:  Patient Site marked: the procedure site was marked   Timeout: prior to procedure the correct patient, procedure, and site was verified   Indications:  Pain and joint swelling Location:  Knee Site:  L knee Prep: patient was prepped and draped in usual sterile fashion   Needle Size:  27 G Needle Length:  1.5 inches Approach:  Medial Ultrasound Guidance: No   Fluoroscopic Guidance: No   Arthrogram: No   Medications:  1.5 mL lidocaine 1 %; 20 mg Sodium Hyaluronate 20 MG/2ML Aspiration Attempted: Yes   Aspirate amount (mL):  0 Patient tolerance:  Patient tolerated the procedure well with no immediate complications   Pollyann Savoy, MD

## 2017-07-05 DIAGNOSIS — H2511 Age-related nuclear cataract, right eye: Secondary | ICD-10-CM | POA: Diagnosis not present

## 2017-07-06 DIAGNOSIS — H2512 Age-related nuclear cataract, left eye: Secondary | ICD-10-CM | POA: Diagnosis not present

## 2017-07-14 ENCOUNTER — Encounter: Payer: Self-pay | Admitting: Cardiology

## 2017-07-14 ENCOUNTER — Encounter (INDEPENDENT_AMBULATORY_CARE_PROVIDER_SITE_OTHER): Payer: Self-pay

## 2017-07-14 ENCOUNTER — Ambulatory Visit (INDEPENDENT_AMBULATORY_CARE_PROVIDER_SITE_OTHER): Payer: Medicare Other | Admitting: Cardiology

## 2017-07-14 VITALS — BP 134/76 | HR 67 | Ht 63.0 in | Wt 140.4 lb

## 2017-07-14 DIAGNOSIS — I10 Essential (primary) hypertension: Secondary | ICD-10-CM

## 2017-07-14 DIAGNOSIS — I5032 Chronic diastolic (congestive) heart failure: Secondary | ICD-10-CM

## 2017-07-14 DIAGNOSIS — I471 Supraventricular tachycardia: Secondary | ICD-10-CM | POA: Diagnosis not present

## 2017-07-14 NOTE — Patient Instructions (Signed)
Medication Instructions:  Your physician recommends that you continue on your current medications as directed. Please refer to the Current Medication list given to you today.  If you need a refill on your cardiac medications before your next appointment, please call your pharmacy.   Labwork: None ordered  Testing/Procedures: None ordered  Follow-Up: Your physician wants you to follow-up in: 1 year with Dr. Camnitz.  You will receive a reminder letter in the mail two months in advance. If you don't receive a letter, please call our office to schedule the follow-up appointment.  Thank you for choosing [facility]!!   Raydin Bielinski, RN [phone]  Any Other Special Instructions Will Be Listed Below (If Applicable).        

## 2017-07-14 NOTE — Progress Notes (Signed)
Electrophysiology Office Note   Date:  07/14/2017   ID:  Renee Jones, DOB 09/21/1951, MRN [id number]  PCP:  Clayborn Heron, MD  Cardiologist:  Duke Salvia Primary Electrophysiologist:  Tomasita Beevers Jorja Loa, MD    Chief Complaint  Patient presents with  . Follow-up    SVT     History of Present Illness: Renee Jones is a 66 y.o. female who presents today for electrophysiology evaluation.   Referred for evaluation of SVT. She was started on metoprolol for a long RP tachycardia.   Today, denies symptoms of palpitations, chest pain, shortness of breath, orthopnea, PND, lower extremity edema, claudication, dizziness, presyncope, syncope, bleeding, or neurologic sequela. The patient is tolerating medications without difficulties.  She has not had any further symptoms of palpitations. She is tolerating her medications without issue. She continues to take her metoprolol without complaint. She says that she is sleeping much better lately.    Past Medical History:  Diagnosis Date  . Allergy   . Asthma   . Bronchial spasm 09/14/2016  . DDD (degenerative disc disease), lumbar 09/14/2016  . Detached retina 09/14/2016  . Diastolic dysfunction 08/28/2015  . Essential hypertension 08/28/2015  . GERD (gastroesophageal reflux disease)   . Glaucoma 09/14/2016  . Hypertension   . Lumbar disc disease   . Neuromuscular disorder (HCC)   . Osteoarthritis of both hands 09/14/2016  . Osteoarthritis of both knees 09/14/2016  . Sarcoidosis   . Sarcoidosis    with +lymph node biopsy  . SVT (supraventricular tachycardia) (HCC) 08/28/2015  . Tachycardia   . Uveitis    Past Surgical History:  Procedure Laterality Date  . CYSTECTOMY    . LUMBAR SPINE SURGERY    . ROTATOR CUFF REPAIR    . TOTAL ABDOMINAL HYSTERECTOMY       Current Outpatient Prescriptions  Medication Sig Dispense Refill  . albuterol (PROVENTIL HFA;VENTOLIN HFA) 108 (90 BASE) MCG/ACT inhaler Inhale 2 puffs  into the lungs every 6 (six) hours as needed for wheezing or shortness of breath.    . cholecalciferol (VITAMIN D) 1000 units tablet Take 1,000 Units by mouth daily.    . fluticasone (FLONASE) 50 MCG/ACT nasal spray Place 2 sprays into both nostrils daily as needed for allergies or rhinitis.    . folic acid (FOLVITE) 1 MG tablet Take 2 tablets (2 mg total) by mouth daily. 180 tablet 4  . hydrochlorothiazide (MICROZIDE) 12.5 MG capsule Take 1 capsule (12.5 mg total) by mouth daily. 30 capsule 5  . hydrochlorothiazide (MICROZIDE) 12.5 MG capsule   5  . InFLIXimab (REMICADE IV) Inject into the vein every 6 (six) weeks.    . methotrexate (RHEUMATREX) 2.5 MG tablet Take 10 tablets (25 mg total) by mouth once a week. Caution:Chemotherapy. Protect from light. 120 tablet 0  . metoprolol (LOPRESSOR) 50 MG tablet 1 AND 1/2 TABLET BY MOUTH TWICE A DAY 90 tablet 11  . Multiple Vitamin (MULTIVITAMIN WITH MINERALS) TABS tablet Take 1 tablet by mouth daily.    . ondansetron (ZOFRAN) 4 MG tablet Take 4 mg by mouth as needed for nausea or vomiting.    . pantoprazole (PROTONIX) 40 MG tablet Take 40 mg by mouth daily.  5  . losartan (COZAAR) 50 MG tablet Take 50 mg by mouth daily.  1   No current facility-administered medications for this visit.     Allergies:   Augmentin [amoxicillin-pot clavulanate]; Codeine; Erythromycin; Sulfa antibiotics; and Tramadol   Social History:  The patient  reports that she has never smoked. She has never used smokeless tobacco. She reports that she does not drink alcohol or use drugs.   Family History:  The patient's family history includes Scleroderma in her daughter. She was adopted.    ROS:  Please see the history of present illness.   Otherwise, review of systems is positive for visual changes.   All other systems are reviewed and negative.   PHYSICAL EXAM: VS:  BP 134/76   Pulse 67   Ht 5\' 3"  (1.6 m)   Wt 140 lb 6.4 oz (63.7 kg)   SpO2 97%   BMI 24.87 kg/m  , BMI  Body mass index is 24.87 kg/m. GEN: Well nourished, well developed, in no acute distress  HEENT: normal  Neck: no JVD, carotid bruits, or masses Cardiac: RRR; no murmurs, rubs, or gallops,no edema  Respiratory:  clear to auscultation bilaterally, normal work of breathing GI: soft, nontender, nondistended, + BS MS: no deformity or atrophy  Skin: warm and dry Neuro:  Strength and sensation are intact Psych: euthymic mood, full affect  EKG:  EKG is not ordered today. Personal review of the ekg ordered 11/18/16 shows SR, rate 72   Recent Labs: 06/16/2017: ALT 21; BUN 14; Creatinine, Ser 0.83; Hemoglobin 14.2; Platelets 300; Potassium 4.4; Sodium 140    Lipid Panel  No results found for: CHOL, TRIG, HDL, CHOLHDL, VLDL, LDLCALC, LDLDIRECT   Wt Readings from Last 3 Encounters:  07/14/17 140 lb 6.4 oz (63.7 kg)  06/16/17 140 lb (63.5 kg)  06/09/17 140 lb (63.5 kg)      Other studies Reviewed: Additional studies/ records that were reviewed today include: TTE 09/03/15  Review of the above records today demonstrates:  - Normal LV systolic function; grade 1 diastolic dysfunction; mild   MR.  2014 CMRI 1. Normal LV size and systolic function, EF 69%.  2. Normal RV size and systolic function.  3. No myocardial delayed enhancement, so no definitive evidence for prior MI, infiltrative disease, or myocarditis.  4. No evidence on this exam for cardiac sarcoidosis.  ASSESSMENT AND PLAN:  1.  SVT: She is not had symptoms from her long RP tachycardia since last being seen. She is on metoprolol that appears to be controlling her issues. No changes at this time.  2. Hypertension: Would pressure well controlled today. No changes at this time.  3. Diastolic dysfunction: Grade 1 currently on metoprolol. No signs of volume overload. No changes at this time.    Current medicines are reviewed at length with the patient today.   The patient does not have concerns regarding her medicines.   The following changes were made today: none  Labs/ tests ordered today include:  No orders of the defined types were placed in this encounter.    Disposition:   FU with Alioune Hodgkin  12  months  Signed, Trent Theisen 09/05/15, MD  07/14/2017 11:12 AM     [facility] [address] [place] [phone] (office) [phone] (fax)

## 2017-07-18 ENCOUNTER — Other Ambulatory Visit: Payer: Self-pay | Admitting: Rheumatology

## 2017-07-19 NOTE — Telephone Encounter (Signed)
Last Visit: 06/09/17 Next Visit: 08/02/17 Labs: 06/16/17 WNL  Okay to refill per Dr. Corliss Skains

## 2017-07-23 ENCOUNTER — Telehealth: Payer: Self-pay | Admitting: Rheumatology

## 2017-07-23 NOTE — Telephone Encounter (Signed)
Patient advised prescription was sent in on 07/19/17. Patient will contact the pharmacy

## 2017-07-23 NOTE — Telephone Encounter (Signed)
Patient needs a refill on MTX. Patient is due to take it this weekend. [facility] uses [facility] [address] in [place].

## 2017-07-26 DIAGNOSIS — H2512 Age-related nuclear cataract, left eye: Secondary | ICD-10-CM | POA: Diagnosis not present

## 2017-07-27 ENCOUNTER — Other Ambulatory Visit: Payer: Self-pay | Admitting: *Deleted

## 2017-07-27 DIAGNOSIS — H209 Unspecified iridocyclitis: Secondary | ICD-10-CM

## 2017-08-02 ENCOUNTER — Ambulatory Visit (HOSPITAL_COMMUNITY)
Admission: RE | Admit: 2017-08-02 | Discharge: 2017-08-02 | Disposition: A | Discharge status: Home / Self Care | Payer: Medicare Other | Source: Ambulatory Visit | Attending: Rheumatology | Admitting: Rheumatology

## 2017-08-02 ENCOUNTER — Telehealth: Payer: Self-pay | Admitting: *Deleted

## 2017-08-02 NOTE — Telephone Encounter (Signed)
[facility] called about patient's Remicade infusion. Patient was scheduled today 08/02/17 for infusion but has had recent cataract surgery on 07/26/17 and goes for  follow up on 08/03/17. Patient will need to obtain clearance from surgeon before proceeding with the  infusion. [facility] states they will reschedule patient's infusion.

## 2017-08-02 NOTE — Progress Notes (Signed)
Pt arrives to medical day for remicade.  She reports cataract surgery 06/2017 and 07/26/2017, with follow up scheduled with surgeon tomorrow 08/02/2017. Spoke with Maricia in Dr Kathie Rhodes. [facility] office.  Pt should be cleared from surgeon before receiving remicade.  Pt instructed to call back tomorrow after followup to reschedule remicade infusion

## 2017-08-09 DIAGNOSIS — Z1231 Encounter for screening mammogram for malignant neoplasm of breast: Secondary | ICD-10-CM | POA: Diagnosis not present

## 2017-08-10 DIAGNOSIS — Z0000 Encounter for general adult medical examination without abnormal findings: Secondary | ICD-10-CM | POA: Diagnosis not present

## 2017-08-26 DIAGNOSIS — H524 Presbyopia: Secondary | ICD-10-CM | POA: Diagnosis not present

## 2017-08-26 DIAGNOSIS — H16223 Keratoconjunctivitis sicca, not specified as Sjogren's, bilateral: Secondary | ICD-10-CM | POA: Diagnosis not present

## 2017-08-26 DIAGNOSIS — H52223 Regular astigmatism, bilateral: Secondary | ICD-10-CM | POA: Diagnosis not present

## 2017-08-26 DIAGNOSIS — Z961 Presence of intraocular lens: Secondary | ICD-10-CM | POA: Diagnosis not present

## 2017-08-26 DIAGNOSIS — H5202 Hypermetropia, left eye: Secondary | ICD-10-CM | POA: Diagnosis not present

## 2017-08-26 DIAGNOSIS — H04123 Dry eye syndrome of bilateral lacrimal glands: Secondary | ICD-10-CM | POA: Diagnosis not present

## 2017-08-26 DIAGNOSIS — H16143 Punctate keratitis, bilateral: Secondary | ICD-10-CM | POA: Diagnosis not present

## 2017-08-26 DIAGNOSIS — H40012 Open angle with borderline findings, low risk, left eye: Secondary | ICD-10-CM | POA: Diagnosis not present

## 2017-09-06 ENCOUNTER — Other Ambulatory Visit: Payer: Self-pay | Admitting: *Deleted

## 2017-09-06 NOTE — Progress Notes (Signed)
Infusion orders are current for patient CBC CMP Tylenol Benadryl appointments are up to date and follow up appointment  is scheduled TB gold not due yet.  

## 2017-09-07 ENCOUNTER — Ambulatory Visit (HOSPITAL_COMMUNITY)
Admission: RE | Admit: 2017-09-07 | Discharge: 2017-09-07 | Disposition: A | Discharge status: Home / Self Care | Payer: Medicare Other | Source: Ambulatory Visit | Attending: Rheumatology | Admitting: Rheumatology

## 2017-09-07 DIAGNOSIS — H209 Unspecified iridocyclitis: Secondary | ICD-10-CM | POA: Diagnosis not present

## 2017-09-07 DIAGNOSIS — I10 Essential (primary) hypertension: Secondary | ICD-10-CM | POA: Diagnosis not present

## 2017-09-07 DIAGNOSIS — E559 Vitamin D deficiency, unspecified: Secondary | ICD-10-CM | POA: Diagnosis not present

## 2017-09-07 DIAGNOSIS — E7800 Pure hypercholesterolemia, unspecified: Secondary | ICD-10-CM | POA: Diagnosis not present

## 2017-09-07 DIAGNOSIS — D869 Sarcoidosis, unspecified: Secondary | ICD-10-CM | POA: Diagnosis not present

## 2017-09-07 DIAGNOSIS — R7303 Prediabetes: Secondary | ICD-10-CM | POA: Diagnosis not present

## 2017-09-07 LAB — COMPREHENSIVE METABOLIC PANEL
ALT: 16 U/L (ref 14–54)
AST: 19 U/L (ref 15–41)
Albumin: 4.2 g/dL (ref 3.5–5.0)
Alkaline Phosphatase: 52 U/L (ref 38–126)
Anion gap: 5 (ref 5–15)
BILIRUBIN TOTAL: 1.1 mg/dL (ref 0.3–1.2)
BUN: 13 mg/dL (ref 6–20)
CO2: 29 mmol/L (ref 22–32)
CREATININE: 0.96 mg/dL (ref 0.44–1.00)
Calcium: 9.3 mg/dL (ref 8.9–10.3)
Chloride: 106 mmol/L (ref 101–111)
GFR calc Af Amer: >60 mL/min (ref >60)
GLUCOSE: 119 mg/dL — AB (ref 65–99)
Potassium: 3.9 mmol/L (ref 3.5–5.1)
Sodium: 140 mmol/L (ref 135–145)
TOTAL PROTEIN: 7 g/dL (ref 6.5–8.1)

## 2017-09-07 LAB — CBC
HCT: 40.4 % (ref 36.0–46.0)
HEMOGLOBIN: 13.6 g/dL (ref 12.0–15.0)
MCH: 30.4 pg (ref 26.0–34.0)
MCHC: 33.7 g/dL (ref 30.0–36.0)
MCV: 90.4 fL (ref 78.0–100.0)
Platelets: 306 10*3/uL (ref 150–400)
RBC: 4.47 MIL/uL (ref 3.87–5.11)
RDW: 14 % (ref 11.5–15.5)
WBC: 6.1 10*3/uL (ref 4.0–10.5)

## 2017-09-07 MED ORDER — DIPHENHYDRAMINE HCL 25 MG PO CAPS
ORAL_CAPSULE | ORAL | Status: AC
  Filled 2017-09-07: qty 1

## 2017-09-07 MED ORDER — DIPHENHYDRAMINE HCL 25 MG PO CAPS
25.0000 mg | ORAL_CAPSULE | ORAL | Status: DC
  Administered 2017-09-07: 25 mg via ORAL

## 2017-09-07 MED ORDER — SODIUM CHLORIDE 0.9 % IV SOLN
3.0000 mg/kg | INTRAVENOUS | Status: DC
  Administered 2017-09-07: 200 mg via INTRAVENOUS
  Filled 2017-09-07: qty 20

## 2017-09-07 MED ORDER — ACETAMINOPHEN 325 MG PO TABS
ORAL_TABLET | ORAL | Status: AC
  Filled 2017-09-07: qty 2

## 2017-09-07 MED ORDER — ACETAMINOPHEN 325 MG PO TABS
650.0000 mg | ORAL_TABLET | ORAL | Status: DC
  Administered 2017-09-07: 650 mg via ORAL

## 2017-09-23 ENCOUNTER — Telehealth: Payer: Self-pay

## 2017-09-23 NOTE — Telephone Encounter (Signed)
Called the patient to verify benefits for 2019. Pt currently received infusions that may require a pre-certification. Spoke to patient who states that her insurance will not changed (Medicare and [employer]). Will submit an annual reverification for 2019 through [employer].   Renee Jones, [place], CPhT 2:50 PM

## 2017-10-04 DIAGNOSIS — M7542 Impingement syndrome of left shoulder: Secondary | ICD-10-CM | POA: Diagnosis not present

## 2017-10-20 ENCOUNTER — Other Ambulatory Visit: Payer: Self-pay | Admitting: Cardiovascular Disease

## 2017-10-20 NOTE — Telephone Encounter (Signed)
Please review for refill, Thanks !  

## 2017-10-21 ENCOUNTER — Other Ambulatory Visit: Payer: Self-pay | Admitting: *Deleted

## 2017-10-21 DIAGNOSIS — D8686 Sarcoid arthropathy: Secondary | ICD-10-CM

## 2017-10-26 ENCOUNTER — Ambulatory Visit (HOSPITAL_COMMUNITY)
Admission: RE | Admit: 2017-10-26 | Discharge: 2017-10-26 | Disposition: A | Discharge status: Home / Self Care | Payer: Medicare Other | Source: Ambulatory Visit | Attending: Rheumatology | Admitting: Rheumatology

## 2017-10-26 DIAGNOSIS — D8686 Sarcoid arthropathy: Secondary | ICD-10-CM | POA: Insufficient documentation

## 2017-10-26 LAB — COMPREHENSIVE METABOLIC PANEL
ALT: 14 U/L (ref 14–54)
ANION GAP: 9 (ref 5–15)
AST: 20 U/L (ref 15–41)
Albumin: 3.7 g/dL (ref 3.5–5.0)
Alkaline Phosphatase: 52 U/L (ref 38–126)
BILIRUBIN TOTAL: 1 mg/dL (ref 0.3–1.2)
BUN: 18 mg/dL (ref 6–20)
CHLORIDE: 106 mmol/L (ref 101–111)
CO2: 24 mmol/L (ref 22–32)
Calcium: 8.8 mg/dL — ABNORMAL LOW (ref 8.9–10.3)
Creatinine, Ser: 0.95 mg/dL (ref 0.44–1.00)
Glucose, Bld: 160 mg/dL — ABNORMAL HIGH (ref 65–99)
POTASSIUM: 3.6 mmol/L (ref 3.5–5.1)
Sodium: 139 mmol/L (ref 135–145)
TOTAL PROTEIN: 6.5 g/dL (ref 6.5–8.1)

## 2017-10-26 LAB — CBC
HEMATOCRIT: 38.6 % (ref 36.0–46.0)
Hemoglobin: 12.5 g/dL (ref 12.0–15.0)
MCH: 29.7 pg (ref 26.0–34.0)
MCHC: 32.4 g/dL (ref 30.0–36.0)
MCV: 91.7 fL (ref 78.0–100.0)
PLATELETS: 269 10*3/uL (ref 150–400)
RBC: 4.21 MIL/uL (ref 3.87–5.11)
RDW: 13.9 % (ref 11.5–15.5)
WBC: 6.1 10*3/uL (ref 4.0–10.5)

## 2017-10-26 MED ORDER — SODIUM CHLORIDE 0.9 % IV SOLN
INTRAVENOUS | Status: DC
  Administered 2017-10-26: 12:00:00 via INTRAVENOUS

## 2017-10-26 MED ORDER — ACETAMINOPHEN 325 MG PO TABS
650.0000 mg | ORAL_TABLET | ORAL | Status: DC
  Administered 2017-10-26: 650 mg via ORAL

## 2017-10-26 MED ORDER — DIPHENHYDRAMINE HCL 25 MG PO CAPS: 25.0000 mg | ORAL_CAPSULE | ORAL | Status: DC

## 2017-10-26 MED ORDER — SODIUM CHLORIDE 0.9 % IV SOLN
3.0000 mg/kg | INTRAVENOUS | Status: DC
  Administered 2017-10-26: 200 mg via INTRAVENOUS
  Filled 2017-10-26: qty 20

## 2017-10-26 MED ORDER — DIPHENHYDRAMINE HCL 25 MG PO CAPS
ORAL_CAPSULE | ORAL | Status: AC
  Administered 2017-10-26: 25 mg
  Filled 2017-10-26: qty 1

## 2017-10-26 MED ORDER — ACETAMINOPHEN 325 MG PO TABS
ORAL_TABLET | ORAL | Status: AC
  Filled 2017-10-26: qty 2

## 2017-10-26 NOTE — Progress Notes (Signed)
Recommend calcium supplement

## 2017-10-28 ENCOUNTER — Other Ambulatory Visit: Payer: Self-pay | Admitting: Rheumatology

## 2017-10-28 NOTE — Telephone Encounter (Signed)
Last Visit: 06/09/17 Next Visit: 01/04/18 Labs: 10/26/17 elevated glucose low calcium   Okay to refill per Dr. Corliss Skains

## 2017-11-01 DIAGNOSIS — M7542 Impingement syndrome of left shoulder: Secondary | ICD-10-CM | POA: Diagnosis not present

## 2017-11-04 ENCOUNTER — Telehealth: Payer: Self-pay | Admitting: Rheumatology

## 2017-11-04 NOTE — Telephone Encounter (Signed)
I LMOM for patient to call, and schedule rov. °

## 2017-11-04 NOTE — Telephone Encounter (Signed)
-----   Message from Henriette Combs, LPN sent at 05/23/1659  8:32 AM EST ----- Regarding: Please schedule patient a follow up visit Please schedule patient a follow up visit. Patient due January 2019. Thanks!

## 2017-11-30 NOTE — Progress Notes (Deleted)
Office Visit Note  Patient: Renee Jones             Date of Birth: 1951/03/07           MRN: [id number]             PCP: Clayborn Heron, MD Referring: Clayborn Heron, MD Visit Date: 12/13/2017 Occupation: @GUAROCC @    Subjective:  No chief complaint on file.   History of Present Illness: Renee Jones is a 67 y.o. female ***   Activities of Daily Living:  Patient reports morning stiffness for *** {minute/hour:19697}.   Patient {ACTIONS;DENIES/REPORTS:21021675::"Denies"} nocturnal pain.  Difficulty dressing/grooming: {ACTIONS;DENIES/REPORTS:21021675::"Denies"} Difficulty climbing stairs: {ACTIONS;DENIES/REPORTS:21021675::"Denies"} Difficulty getting out of chair: {ACTIONS;DENIES/REPORTS:21021675::"Denies"} Difficulty using hands for taps, buttons, cutlery, and/or writing: {ACTIONS;DENIES/REPORTS:21021675::"Denies"}   No Rheumatology ROS completed.   PMFS History:  Patient Active Problem List   Diagnosis Date Noted  . High risk medications (not anticoagulants) long-term use 01/13/2017  . GERD (gastroesophageal reflux disease) 09/14/2016  . Bronchial spasm 09/14/2016  . Glaucoma 09/14/2016  . Detached retina 09/14/2016  . Osteoarthritis of both hands 09/14/2016  . Osteoarthritis of both knees 09/14/2016  . DDD (degenerative disc disease), lumbar 09/14/2016  . Uveitis 06/22/2016  . SVT (supraventricular tachycardia) (HCC) 08/28/2015  . Essential hypertension 08/28/2015  . Diastolic dysfunction 08/28/2015  . Sarcoidosis 09/30/2012  . Pulmonary nodules 09/30/2012  . Tachycardia 09/30/2012  . Sarcoid arthropathy 09/30/2012    Past Medical History:  Diagnosis Date  . Allergy   . Asthma   . Bronchial spasm 09/14/2016  . DDD (degenerative disc disease), lumbar 09/14/2016  . Detached retina 09/14/2016  . Diastolic dysfunction 08/28/2015  . Essential hypertension 08/28/2015  . GERD (gastroesophageal reflux disease)   . Glaucoma 09/14/2016  .  Hypertension   . Lumbar disc disease   . Neuromuscular disorder (HCC)   . Osteoarthritis of both hands 09/14/2016  . Osteoarthritis of both knees 09/14/2016  . Sarcoidosis   . Sarcoidosis    with +lymph node biopsy  . SVT (supraventricular tachycardia) (HCC) 08/28/2015  . Tachycardia   . Uveitis     Family History  Adopted: Yes  Problem Relation Age of Onset  . Scleroderma Daughter    Past Surgical History:  Procedure Laterality Date  . CYSTECTOMY    . LUMBAR SPINE SURGERY    . ROTATOR CUFF REPAIR    . TOTAL ABDOMINAL HYSTERECTOMY     Social History   Social History Narrative  . Not on file     Objective: Vital Signs: There were no vitals taken for this visit.   Physical Exam   Musculoskeletal Exam: ***  CDAI Exam: No CDAI exam completed.    Investigation: No additional findings. CBC Latest Ref Rng & Units 10/26/2017 09/07/2017 06/16/2017  WBC 4.0 - 10.5 K/uL 6.1 6.1 7.2  Hemoglobin 12.0 - 15.0 g/dL 06/18/2017 54.6 50.3  Hematocrit 36.0 - 46.0 % 38.6 40.4 41.1  Platelets 150 - 400 K/uL 269 306 300   CMP Latest Ref Rng & Units 10/26/2017 09/07/2017 06/16/2017  Glucose 65 - 99 mg/dL 06/18/2017) 568(L) 275(T)  BUN 6 - 20 mg/dL 18 13 14   Creatinine 0.44 - 1.00 mg/dL 700(F 7.49  Sodium 135 - 145 mmol/L 139 140 140  Potassium 3.5 - 5.1 mmol/L 3.6 3.9 4.4  Chloride 101 - 111 mmol/L 106 106 109  CO2 22 - 32 mmol/L 24 29 24   Calcium 8.9 - 10.3 mg/dL 4.49) 9.3 9.4  Total Protein 6.5 -  8.1 g/dL 6.5 7.0 7.1  Total Bilirubin 0.3 - 1.2 mg/dL 1.0 1.1 1.0  Alkaline Phos 38 - 126 U/L 52 52 53  AST 15 - 41 U/L 20 19 28   ALT 14 - 54 U/L 14 16 21     Imaging: No results found.  Speciality Comments: REMICADE 3 mg/kg x 6 weeks TB GOLD negative 06/16/2017    Procedures:  No procedures performed Allergies: Augmentin [amoxicillin-pot clavulanate]; Codeine; Erythromycin; Sulfa antibiotics; and Tramadol   Assessment / Plan:     Visit Diagnoses: Sarcoidosis  High risk  medications (not anticoagulants) long-term use - Remicade IV, MTX, folic acid   Primary osteoarthritis of both hands  Primary osteoarthritis of both knees  DDD (degenerative disc disease), lumbar  Uveitis  Diastolic dysfunction  Glaucoma, unspecified glaucoma type, unspecified laterality  Retinal detachment, unspecified laterality    Orders: No orders of the defined types were placed in this encounter.  No orders of the defined types were placed in this encounter.   Face-to-face time spent with patient was *** minutes. 50% of time was spent in counseling and coordination of care.  Follow-Up Instructions: No Follow-up on file.   , PA-C  Note - This record has been created using Dragon software.  Chart creation errors have been sought, but may not always  have been located. Such creation errors do not reflect on  the standard of medical care.

## 2017-12-01 ENCOUNTER — Other Ambulatory Visit: Payer: Self-pay | Admitting: *Deleted

## 2017-12-01 DIAGNOSIS — D8686 Sarcoid arthropathy: Secondary | ICD-10-CM

## 2017-12-07 ENCOUNTER — Encounter (HOSPITAL_COMMUNITY)
Admission: RE | Admit: 2017-12-07 | Discharge: 2017-12-07 | Disposition: A | Discharge status: Home / Self Care | Payer: Medicare Other | Source: Ambulatory Visit | Attending: Rheumatology | Admitting: Rheumatology

## 2017-12-07 DIAGNOSIS — D8686 Sarcoid arthropathy: Secondary | ICD-10-CM | POA: Diagnosis not present

## 2017-12-07 MED ORDER — SODIUM CHLORIDE 0.9 % IV SOLN
3.0000 mg/kg | INTRAVENOUS | Status: DC
  Administered 2017-12-07: 200 mg via INTRAVENOUS
  Filled 2017-12-07: qty 20

## 2017-12-07 MED ORDER — ACETAMINOPHEN 325 MG PO TABS
ORAL_TABLET | ORAL | Status: AC
  Filled 2017-12-07: qty 2

## 2017-12-07 MED ORDER — ACETAMINOPHEN 325 MG PO TABS
650.0000 mg | ORAL_TABLET | ORAL | Status: DC
  Administered 2017-12-07: 650 mg via ORAL

## 2017-12-07 MED ORDER — DIPHENHYDRAMINE HCL 25 MG PO CAPS
25.0000 mg | ORAL_CAPSULE | ORAL | Status: DC
  Administered 2017-12-07: 25 mg via ORAL

## 2017-12-07 MED ORDER — DIPHENHYDRAMINE HCL 25 MG PO CAPS
ORAL_CAPSULE | ORAL | Status: AC
  Filled 2017-12-07: qty 1

## 2017-12-13 ENCOUNTER — Ambulatory Visit: Payer: Medicare Other | Admitting: Physician Assistant

## 2017-12-15 NOTE — Progress Notes (Signed)
Office Visit Note  Patient: Renee Jones             Date of Birth: 03/01/1951           MRN: [id number]             PCP: Clayborn Heron, MD Referring: Clayborn Heron, MD Visit Date: 12/17/2017 Occupation: @GUAROCC @    Subjective:  Hand pain    History of Present Illness: Dominik Lauricella is a 67 y.o. female with history of uveitis, sarcoidosis, and osteoarthritis.  Patient states she continues to get her Remicade infusions every 6 weeks.  Her next infusion is January 18, 2018.  She also is taking MTX 10 tablets weekly and folic acid 2 mg daily.  She is curious if she can decrease her MTX dose.  She denies any flares of uveitis recently.  She is going to see her Ophthalmologist in March.  She states follows up with him every 6 months.  She states that she has not had any pulmonary symptoms.  She denies any coughing, shortness of breath, difficulty breathing, hemoptysis, or swollen lymph nodes. She reports that she occasionally experiences hand pain.  She describes the pain as aching.  She denies any joint swelling.  She uses battery operated gloves for comfort and takes Aleve if the pain is severe. Patient states that her right knee is doing very well after Hyalgan injections.  She states that her left knee injection only last about 4 months.  She is having increased discomfort in her left knee.  She denies any joint swelling.  She uses voltaren gel on her knees.  She needs a refill of voltaren gel.    Activities of Daily Living:  Patient reports morning stiffness for 10 minutes.   Patient Reports nocturnal pain.  Difficulty dressing/grooming: Denies Difficulty climbing stairs: Denies Difficulty getting out of chair: Denies Difficulty using hands for taps, buttons, cutlery, and/or writing: Denies   Review of Systems  Constitutional: Negative for fatigue, fever and weakness.  HENT: Negative for mouth sores, trouble swallowing, trouble swallowing, mouth dryness and  nose dryness.   Eyes: Positive for dryness. Negative for pain, redness and visual disturbance.  Respiratory: Negative for cough and shortness of breath.   Cardiovascular: Negative for chest pain, palpitations, hypertension and swelling in legs/feet.  Gastrointestinal: Negative for abdominal pain, blood in stool, constipation and diarrhea.  Endocrine: Negative for heat intolerance.  Genitourinary: Negative for pelvic pain.  Musculoskeletal: Positive for arthralgias, joint pain and morning stiffness. Negative for joint swelling, myalgias, muscle weakness, muscle tenderness and myalgias.  Skin: Positive for hair loss. Negative for rash, redness and sensitivity to sunlight.  Allergic/Immunologic: Negative for susceptible to infections.  Neurological: Positive for light-headedness. Negative for dizziness, numbness and headaches.  Hematological: Negative for bruising/bleeding tendency.  Psychiatric/Behavioral: Negative for depressed mood and sleep disturbance. The patient is not nervous/anxious.     PMFS History:  Patient Active Problem List   Diagnosis Date Noted  . High risk medications (not anticoagulants) long-term use 01/13/2017  . GERD (gastroesophageal reflux disease) 09/14/2016  . Bronchial spasm 09/14/2016  . Glaucoma 09/14/2016  . Detached retina 09/14/2016  . Osteoarthritis of both hands 09/14/2016  . Osteoarthritis of both knees 09/14/2016  . DDD (degenerative disc disease), lumbar 09/14/2016  . Uveitis 06/22/2016  . SVT (supraventricular tachycardia) (HCC) 08/28/2015  . Essential hypertension 08/28/2015  . Diastolic dysfunction 08/28/2015  . Sarcoidosis 09/30/2012  . Pulmonary nodules 09/30/2012  . Tachycardia 09/30/2012  .  Sarcoid arthropathy 09/30/2012    Past Medical History:  Diagnosis Date  . Allergy   . Asthma   . Bronchial spasm 09/14/2016  . DDD (degenerative disc disease), lumbar 09/14/2016  . Detached retina 09/14/2016  . Diastolic dysfunction 08/28/2015  .  Essential hypertension 08/28/2015  . GERD (gastroesophageal reflux disease)   . Glaucoma 09/14/2016  . Hypertension   . Lumbar disc disease   . Neuromuscular disorder (HCC)   . Osteoarthritis of both hands 09/14/2016  . Osteoarthritis of both knees 09/14/2016  . Sarcoidosis   . Sarcoidosis    with +lymph node biopsy  . SVT (supraventricular tachycardia) (HCC) 08/28/2015  . Tachycardia   . Uveitis     Family History  Adopted: Yes  Problem Relation Age of Onset  . Scleroderma Daughter    Past Surgical History:  Procedure Laterality Date  . CYSTECTOMY    . EYE SURGERY Bilateral 2018   cataract removal   . LUMBAR SPINE SURGERY    . ROTATOR CUFF REPAIR    . TOTAL ABDOMINAL HYSTERECTOMY     Social History   Social History Narrative  . Not on file     Objective: Vital Signs: BP 126/74 (BP Location: Left Arm, Patient Position: Sitting, Cuff Size: Normal)   Pulse 65   Resp 17   Ht 5\' 3"  (1.6 m)   Wt 142 lb (64.4 kg)   BMI 25.15 kg/m    Physical Exam  Constitutional: She is oriented to person, place, and time. She appears well-developed and well-nourished.  HENT:  Head: Normocephalic and atraumatic.  Eyes: Conjunctivae and EOM are normal.  Neck: Normal range of motion.  Cardiovascular: Normal rate, regular rhythm, normal heart sounds and intact distal pulses.  Pulmonary/Chest: Effort normal and breath sounds normal.  Abdominal: Soft. Bowel sounds are normal.  Lymphadenopathy:    She has no cervical adenopathy.  Neurological: She is alert and oriented to person, place, and time.  Skin: Skin is warm and dry. Capillary refill takes less than 2 seconds.  Psychiatric: She has a normal mood and affect. Her behavior is normal.  Nursing note and vitals reviewed.    Musculoskeletal Exam: C-spine, thoracic, and lumbar spine good ROM.  Shoulder joints, elbow joints, wrist joints, MCPs, PIPs, and DIPs good ROM with no synovitis. She has PIP thickening of bilateral thumbs. Hip  joints, knee joints, ankle joints, MTPs, PIPs, and DIPs good ROM with no synovitis.  She has bilateral knee crepitus.  No warmth or effusion.  No tenderness of trochanteric bursa.   CDAI Exam: No CDAI exam completed.    Investigation: No additional findings. CBC Latest Ref Rng & Units 10/26/2017 09/07/2017 06/16/2017  WBC 4.0 - 10.5 K/uL 6.1 6.1 7.2  Hemoglobin 12.0 - 15.0 g/dL 06/18/2017 40.9 81.1  Hematocrit 36.0 - 46.0 % 38.6 40.4 41.1  Platelets 150 - 400 K/uL 269 306 300   CMP Latest Ref Rng & Units 10/26/2017 09/07/2017 06/16/2017  Glucose 65 - 99 mg/dL 06/18/2017) 782(N) 562(Z)  BUN 6 - 20 mg/dL 18 13 14   Creatinine 0.44 - 1.00 mg/dL 308(M 5.78  Sodium 135 - 145 mmol/L 139 140 140  Potassium 3.5 - 5.1 mmol/L 3.6 3.9 4.4  Chloride 101 - 111 mmol/L 106 106 109  CO2 22 - 32 mmol/L 24 29 24   Calcium 8.9 - 10.3 mg/dL 4.69) 9.3 9.4  Total Protein 6.5 - 8.1 g/dL 6.5 7.0 7.1  Total Bilirubin 0.3 - 1.2 mg/dL 1.0 1.1 1.0  Alkaline  Phos 38 - 126 U/L 52 52 53  AST 15 - 41 U/L 20 19 28   ALT 14 - 54 U/L 14 16 21     Imaging: No results found.  Speciality Comments: REMICADE 3 mg/kg x 6 weeks TB GOLD negative 06/16/2017    Procedures:  No procedures performed Allergies: Augmentin [amoxicillin-pot clavulanate]; Codeine; Erythromycin; Sulfa antibiotics; and Tramadol   Assessment / Plan:     Visit Diagnoses: Uveitis: She denies any recent flares of uveitis.  She is following up with her ophthamologist in March 2019.  She continues to have Remicade infusions every 6 weeks. She is due for her next infusion January 18, 2018.  She has been taking Methotrexate 10 tablets on sundays and folic acid 2 mg.  She is going to decrease her MTX to 8 tablets weekly since she is clinically doing so well.  If she continues to do well and has no flares, we will consider spacing out her infusions or further decreasing her MTX dose.  She has labs with her infusions.  TB gold is due in August 2019.   Sarcoidosis: She has  no pulmonary symptoms or lymphadenopathy.  Her lungs are clear on exam.  She will continue on her current treatment regimen.    Pulmonary nodules  High risk medications (not anticoagulants) long-term use - MTX 8 tablets weekly and folic acid 2 mg daily, Remicade IV every 6 weeks. Next infusion is January 18, 2018. CBC/CMP: 10/26/17.  Her labs will be performed with her next infusion. TB gold 06/16/18.  She will have TB gold drawn in August 2019.   Primary osteoarthritis of both hands: She has mild osteoarthritic changes in her hands.  She has no synovitis or tenderness on exam.  She has complete fist formation.  Joint protection and muscle strengthening were discussed.  She wears battery operated gloves and uses Voltaren gel.  She was given a refill of Voltaren gel.    Primary osteoarthritis of both knees: She has bilateral knee crepitus.  No warmth or effusion.  She had hyalgan injections in August 2018.  Right knee responded well and is still doing good.  Her left knee injection only provided about 4 months of relief.  She would like to wait to apply for hyalgan until Summer 2019.   DDD (degenerative disc disease), lumbar: She has no midline spinal tenderness on exam.   Essential hypertension: She was advised to monitor her blood pressure closely.  She reports her blood pressure was elevated yesterday while at home in bed.  She was also lightheaded.  She was advised to follow up with PCP.  Her BP was well controlled in the office today.   Other medical conditions are listed as follows:   Gastroesophageal reflux disease without esophagitis  SVT (supraventricular tachycardia) (HCC)    Orders: No orders of the defined types were placed in this encounter.  Meds ordered this encounter  Medications  . diclofenac sodium (VOLTAREN) 1 % GEL    Sig: Apply three grams to three large joints up to three times daily.    Dispense:  3 Tube    Refill:  3    Face-to-face time spent with patient was 30   minutes. Greater than 50% of time was spent in counseling and coordination of care.  Follow-Up Instructions: Return in about 6 months (around 06/19/2018) for Uveitis, Sarcoidosis, Osteoarthritis.   Gearldine Bienenstock, PA-C  Note - This record has been created using Dragon software.  Chart creation errors have  been sought, but may not always  have been located. Such creation errors do not reflect on  the standard of medical care.

## 2017-12-17 ENCOUNTER — Ambulatory Visit (INDEPENDENT_AMBULATORY_CARE_PROVIDER_SITE_OTHER): Payer: Medicare Other | Admitting: Physician Assistant

## 2017-12-17 ENCOUNTER — Encounter: Payer: Self-pay | Admitting: Physician Assistant

## 2017-12-17 VITALS — BP 126/74 | HR 65 | Resp 17 | Ht 63.0 in | Wt 142.0 lb

## 2017-12-17 DIAGNOSIS — Z79899 Other long term (current) drug therapy: Secondary | ICD-10-CM | POA: Diagnosis not present

## 2017-12-17 DIAGNOSIS — I471 Supraventricular tachycardia: Secondary | ICD-10-CM

## 2017-12-17 DIAGNOSIS — M5136 Other intervertebral disc degeneration, lumbar region: Secondary | ICD-10-CM

## 2017-12-17 DIAGNOSIS — M19042 Primary osteoarthritis, left hand: Secondary | ICD-10-CM | POA: Diagnosis not present

## 2017-12-17 DIAGNOSIS — H209 Unspecified iridocyclitis: Secondary | ICD-10-CM | POA: Diagnosis not present

## 2017-12-17 DIAGNOSIS — M170 Bilateral primary osteoarthritis of knee: Secondary | ICD-10-CM

## 2017-12-17 DIAGNOSIS — K219 Gastro-esophageal reflux disease without esophagitis: Secondary | ICD-10-CM

## 2017-12-17 DIAGNOSIS — I10 Essential (primary) hypertension: Secondary | ICD-10-CM

## 2017-12-17 DIAGNOSIS — R918 Other nonspecific abnormal finding of lung field: Secondary | ICD-10-CM

## 2017-12-17 DIAGNOSIS — M19041 Primary osteoarthritis, right hand: Secondary | ICD-10-CM

## 2017-12-17 DIAGNOSIS — D869 Sarcoidosis, unspecified: Secondary | ICD-10-CM

## 2017-12-17 MED ORDER — DICLOFENAC SODIUM 1 % TD GEL
TRANSDERMAL | 3 refills | Status: DC
Start: 2017-12-17 — End: 2018-06-24

## 2018-01-04 ENCOUNTER — Inpatient Hospital Stay (HOSPITAL_COMMUNITY): Admission: RE | Admit: 2018-01-04 | Payer: Medicare Other | Source: Ambulatory Visit

## 2018-01-14 ENCOUNTER — Other Ambulatory Visit: Payer: Self-pay | Admitting: *Deleted

## 2018-01-14 NOTE — Progress Notes (Signed)
Infusion orders are current for patient CBC CMP Tylenol Benadryl appointments are up to date and follow up appointment  is scheduled TB gold not due yet.  

## 2018-01-18 ENCOUNTER — Encounter (HOSPITAL_COMMUNITY)
Admission: RE | Admit: 2018-01-18 | Discharge: 2018-01-18 | Disposition: A | Discharge status: Home / Self Care | Payer: Medicare Other | Source: Ambulatory Visit | Attending: Rheumatology | Admitting: Rheumatology

## 2018-01-18 DIAGNOSIS — R739 Hyperglycemia, unspecified: Secondary | ICD-10-CM | POA: Diagnosis not present

## 2018-01-18 DIAGNOSIS — D8686 Sarcoid arthropathy: Secondary | ICD-10-CM | POA: Diagnosis not present

## 2018-01-18 LAB — COMPREHENSIVE METABOLIC PANEL
ALT: 19 U/L (ref 14–54)
ANION GAP: 9 (ref 5–15)
AST: 27 U/L (ref 15–41)
Albumin: 4.1 g/dL (ref 3.5–5.0)
Alkaline Phosphatase: 53 U/L (ref 38–126)
BILIRUBIN TOTAL: 1 mg/dL (ref 0.3–1.2)
BUN: 15 mg/dL (ref 6–20)
CHLORIDE: 106 mmol/L (ref 101–111)
CO2: 22 mmol/L (ref 22–32)
Calcium: 9.1 mg/dL (ref 8.9–10.3)
Creatinine, Ser: 0.83 mg/dL (ref 0.44–1.00)
Glucose, Bld: 129 mg/dL — ABNORMAL HIGH (ref 65–99)
POTASSIUM: 4.2 mmol/L (ref 3.5–5.1)
Sodium: 137 mmol/L (ref 135–145)
TOTAL PROTEIN: 6.8 g/dL (ref 6.5–8.1)

## 2018-01-18 LAB — CBC
HCT: 41.2 % (ref 36.0–46.0)
Hemoglobin: 13.9 g/dL (ref 12.0–15.0)
MCH: 30.6 pg (ref 26.0–34.0)
MCHC: 33.7 g/dL (ref 30.0–36.0)
MCV: 90.7 fL (ref 78.0–100.0)
PLATELETS: 305 10*3/uL (ref 150–400)
RBC: 4.54 MIL/uL (ref 3.87–5.11)
RDW: 14.1 % (ref 11.5–15.5)
WBC: 6.4 10*3/uL (ref 4.0–10.5)

## 2018-01-18 MED ORDER — ACETAMINOPHEN 325 MG PO TABS
ORAL_TABLET | ORAL | Status: AC
  Filled 2018-01-18: qty 2

## 2018-01-18 MED ORDER — DIPHENHYDRAMINE HCL 25 MG PO CAPS
25.0000 mg | ORAL_CAPSULE | ORAL | Status: DC
  Administered 2018-01-18: 25 mg via ORAL

## 2018-01-18 MED ORDER — SODIUM CHLORIDE 0.9 % IV SOLN
3.0000 mg/kg | INTRAVENOUS | Status: DC
  Administered 2018-01-18: 200 mg via INTRAVENOUS
  Filled 2018-01-18: qty 20

## 2018-01-18 MED ORDER — DIPHENHYDRAMINE HCL 25 MG PO CAPS
ORAL_CAPSULE | ORAL | Status: AC
  Filled 2018-01-18: qty 1

## 2018-01-18 MED ORDER — ACETAMINOPHEN 325 MG PO TABS
650.0000 mg | ORAL_TABLET | ORAL | Status: DC
  Administered 2018-01-18: 650 mg via ORAL

## 2018-01-18 NOTE — Progress Notes (Signed)
Glucose is mildly elevated.

## 2018-02-09 ENCOUNTER — Other Ambulatory Visit: Payer: Self-pay | Admitting: Cardiovascular Disease

## 2018-02-09 ENCOUNTER — Other Ambulatory Visit: Payer: Self-pay | Admitting: Rheumatology

## 2018-02-09 NOTE — Telephone Encounter (Signed)
Last Visit: 12/17/17 Next Visit: 06/24/18 Labs: 01/18/18 Glucose is mildly elevated. Rest WNL  Okay to refill per Dr. Corliss Skains

## 2018-02-09 NOTE — Telephone Encounter (Signed)
REFILL 

## 2018-02-09 NOTE — Telephone Encounter (Signed)
Please review for refill. Thanks!  

## 2018-02-25 ENCOUNTER — Other Ambulatory Visit: Payer: Self-pay | Admitting: *Deleted

## 2018-02-25 DIAGNOSIS — D8686 Sarcoid arthropathy: Secondary | ICD-10-CM

## 2018-03-01 ENCOUNTER — Encounter (HOSPITAL_COMMUNITY)
Admission: RE | Admit: 2018-03-01 | Discharge: 2018-03-01 | Disposition: A | Discharge status: Home / Self Care | Payer: Medicare Other | Source: Ambulatory Visit | Attending: Rheumatology | Admitting: Rheumatology

## 2018-03-01 DIAGNOSIS — D8686 Sarcoid arthropathy: Secondary | ICD-10-CM | POA: Insufficient documentation

## 2018-03-01 MED ORDER — DIPHENHYDRAMINE HCL 25 MG PO CAPS: 25.0000 mg | ORAL_CAPSULE | ORAL | Status: DC

## 2018-03-01 MED ORDER — ACETAMINOPHEN 325 MG PO TABS: 650.0000 mg | ORAL_TABLET | ORAL | Status: DC

## 2018-03-01 MED ORDER — INFLIXIMAB 100 MG IV SOLR
3.0000 mg/kg | INTRAVENOUS | Status: DC
  Administered 2018-03-01: 200 mg via INTRAVENOUS
  Filled 2018-03-01: qty 20

## 2018-03-01 MED ORDER — DIPHENHYDRAMINE HCL 25 MG PO CAPS
ORAL_CAPSULE | ORAL | Status: AC
  Administered 2018-03-01: 25 mg
  Filled 2018-03-01: qty 1

## 2018-03-01 MED ORDER — ACETAMINOPHEN 325 MG PO TABS
ORAL_TABLET | ORAL | Status: AC
  Administered 2018-03-01: 650 mg
  Filled 2018-03-01: qty 2

## 2018-03-15 ENCOUNTER — Telehealth: Payer: Self-pay | Admitting: Rheumatology

## 2018-03-15 NOTE — Telephone Encounter (Signed)
Patient left a voicemail stating that she is going to [place] in 2 weeks and was told by her travel agent that she should contact her doctor and ask if she should get a Hepatitis injection.

## 2018-03-16 NOTE — Telephone Encounter (Signed)
Attempted to call patient and left message on machine with information below.

## 2018-03-16 NOTE — Telephone Encounter (Signed)
Patient is on MTX and folic acid. Should patient receive hep vaccine since she will be traveling to [place]?

## 2018-03-16 NOTE — Telephone Encounter (Signed)
She cannot take any live vaccines.  She may discuss with her PCP about which vaccines she can take.

## 2018-03-17 DIAGNOSIS — H04123 Dry eye syndrome of bilateral lacrimal glands: Secondary | ICD-10-CM | POA: Diagnosis not present

## 2018-03-17 DIAGNOSIS — H11423 Conjunctival edema, bilateral: Secondary | ICD-10-CM | POA: Diagnosis not present

## 2018-03-17 DIAGNOSIS — H11153 Pinguecula, bilateral: Secondary | ICD-10-CM | POA: Diagnosis not present

## 2018-03-17 DIAGNOSIS — H5713 Ocular pain, bilateral: Secondary | ICD-10-CM | POA: Diagnosis not present

## 2018-03-17 DIAGNOSIS — H16223 Keratoconjunctivitis sicca, not specified as Sjogren's, bilateral: Secondary | ICD-10-CM | POA: Diagnosis not present

## 2018-03-17 DIAGNOSIS — Z961 Presence of intraocular lens: Secondary | ICD-10-CM | POA: Diagnosis not present

## 2018-03-17 DIAGNOSIS — H16143 Punctate keratitis, bilateral: Secondary | ICD-10-CM | POA: Diagnosis not present

## 2018-03-17 DIAGNOSIS — Z9849 Cataract extraction status, unspecified eye: Secondary | ICD-10-CM | POA: Diagnosis not present

## 2018-03-17 DIAGNOSIS — H18413 Arcus senilis, bilateral: Secondary | ICD-10-CM | POA: Diagnosis not present

## 2018-03-21 ENCOUNTER — Telehealth: Payer: Self-pay | Admitting: Rheumatology

## 2018-03-21 ENCOUNTER — Telehealth (INDEPENDENT_AMBULATORY_CARE_PROVIDER_SITE_OTHER): Payer: Self-pay

## 2018-03-21 MED ORDER — FOLIC ACID 1 MG PO TABS: 2.0000 mg | ORAL_TABLET | Freq: Every day | ORAL | 4 refills | Status: DC

## 2018-03-21 NOTE — Telephone Encounter (Signed)
Faxed completed Hyalgan application to [phone] for left knee.

## 2018-03-21 NOTE — Telephone Encounter (Signed)
Patient also wants to proceed with gel injections for Lt Knee. Please call if there is a question or problem.

## 2018-03-21 NOTE — Telephone Encounter (Signed)
Last Visit: 12/17/17 Next Visit: 06/24/18  Okay to refill per Dr. Corliss Skains

## 2018-03-21 NOTE — Telephone Encounter (Signed)
Patient requesting a refill on Folic Acid sent to [facility] in [place].

## 2018-03-21 NOTE — Telephone Encounter (Signed)
Noted  

## 2018-03-22 DIAGNOSIS — M7542 Impingement syndrome of left shoulder: Secondary | ICD-10-CM | POA: Diagnosis not present

## 2018-04-01 ENCOUNTER — Telehealth (INDEPENDENT_AMBULATORY_CARE_PROVIDER_SITE_OTHER): Payer: Self-pay

## 2018-04-01 NOTE — Telephone Encounter (Signed)
Talked with Renee Jones with Hyalgan and advised her that patient only has Medicare part A & B for her primary insurance, no secondary insurance and [employer] for medication.

## 2018-04-04 DIAGNOSIS — M25512 Pain in left shoulder: Secondary | ICD-10-CM | POA: Diagnosis not present

## 2018-04-04 DIAGNOSIS — M7542 Impingement syndrome of left shoulder: Secondary | ICD-10-CM | POA: Diagnosis not present

## 2018-04-08 ENCOUNTER — Other Ambulatory Visit: Payer: Self-pay | Admitting: *Deleted

## 2018-04-08 DIAGNOSIS — M7542 Impingement syndrome of left shoulder: Secondary | ICD-10-CM | POA: Diagnosis not present

## 2018-04-08 NOTE — Progress Notes (Signed)
Infusion orders are current for patient CBC CMP Tylenol Benadryl appointments are up to date and follow up appointment  is scheduled TB gold not due yet.  

## 2018-04-12 ENCOUNTER — Ambulatory Visit (HOSPITAL_COMMUNITY)
Admission: RE | Admit: 2018-04-12 | Discharge: 2018-04-12 | Disposition: A | Discharge status: Home / Self Care | Payer: Medicare Other | Source: Ambulatory Visit | Attending: Rheumatology | Admitting: Rheumatology

## 2018-04-12 DIAGNOSIS — D8686 Sarcoid arthropathy: Secondary | ICD-10-CM

## 2018-04-12 LAB — COMPREHENSIVE METABOLIC PANEL
ALT: 17 U/L (ref 0–44)
ANION GAP: 7 (ref 5–15)
AST: 21 U/L (ref 15–41)
Albumin: 4 g/dL (ref 3.5–5.0)
Alkaline Phosphatase: 52 U/L (ref 38–126)
BUN: 14 mg/dL (ref 8–23)
CO2: 25 mmol/L (ref 22–32)
Calcium: 9.1 mg/dL (ref 8.9–10.3)
Chloride: 110 mmol/L (ref 98–111)
Creatinine, Ser: 0.87 mg/dL (ref 0.44–1.00)
GFR calc non Af Amer: >60 mL/min (ref >60)
Glucose, Bld: 115 mg/dL — ABNORMAL HIGH (ref 70–99)
POTASSIUM: 3.7 mmol/L (ref 3.5–5.1)
SODIUM: 142 mmol/L (ref 135–145)
Total Bilirubin: 0.8 mg/dL (ref 0.3–1.2)
Total Protein: 6.7 g/dL (ref 6.5–8.1)

## 2018-04-12 LAB — CBC
HCT: 39.5 % (ref 36.0–46.0)
Hemoglobin: 13.1 g/dL (ref 12.0–15.0)
MCH: 30.2 pg (ref 26.0–34.0)
MCHC: 33.2 g/dL (ref 30.0–36.0)
MCV: 91 fL (ref 78.0–100.0)
PLATELETS: 319 10*3/uL (ref 150–400)
RBC: 4.34 MIL/uL (ref 3.87–5.11)
RDW: 13 % (ref 11.5–15.5)
WBC: 5.4 10*3/uL (ref 4.0–10.5)

## 2018-04-12 MED ORDER — SODIUM CHLORIDE 0.9 % IV SOLN
3.0000 mg/kg | INTRAVENOUS | Status: DC
  Administered 2018-04-12: 200 mg via INTRAVENOUS
  Filled 2018-04-12: qty 20

## 2018-04-12 MED ORDER — ACETAMINOPHEN 325 MG PO TABS: 650.0000 mg | ORAL_TABLET | ORAL | Status: DC

## 2018-04-12 MED ORDER — ACETAMINOPHEN 325 MG PO TABS
ORAL_TABLET | ORAL | Status: AC
  Administered 2018-04-12: 650 mg
  Filled 2018-04-12: qty 2

## 2018-04-12 MED ORDER — DIPHENHYDRAMINE HCL 25 MG PO CAPS: 25.0000 mg | ORAL_CAPSULE | ORAL | Status: DC

## 2018-04-12 MED ORDER — SODIUM CHLORIDE 0.9 % IV SOLN
INTRAVENOUS | Status: DC
  Administered 2018-04-12: 12:00:00 via INTRAVENOUS

## 2018-04-12 MED ORDER — DIPHENHYDRAMINE HCL 25 MG PO CAPS
ORAL_CAPSULE | ORAL | Status: AC
  Administered 2018-04-12: 25 mg
  Filled 2018-04-12: qty 1

## 2018-04-15 ENCOUNTER — Telehealth (INDEPENDENT_AMBULATORY_CARE_PROVIDER_SITE_OTHER): Payer: Self-pay

## 2018-04-15 NOTE — Telephone Encounter (Signed)
Can you please schedule patient appointment for  Hyalgan, 5 series, Left Knee Buy & Bill No PA required. Patient's plan covers 80% and patient will be responsible for 20%OOP.  Thank You.

## 2018-05-06 NOTE — Telephone Encounter (Signed)
I LMOM for patient to call, and schedule knee injection appts with Ladona Ridgel.

## 2018-05-09 ENCOUNTER — Other Ambulatory Visit: Payer: Self-pay | Admitting: *Deleted

## 2018-05-09 NOTE — Progress Notes (Signed)
Infusion orders are current for patient CBC CMP Tylenol Benadryl appointments are up to date and follow up appointment  is scheduled TB gold not due yet.  

## 2018-05-11 ENCOUNTER — Ambulatory Visit: Payer: Medicare Other | Admitting: Rheumatology

## 2018-05-12 NOTE — Telephone Encounter (Signed)
Patient called stating she will schedule the Hyalgan injections in September once she has her work and surgery schedule.  Patient states she has an appointment on 06/24/18 and will schedule them at that time.

## 2018-05-17 ENCOUNTER — Ambulatory Visit: Payer: Medicare Other | Admitting: Rheumatology

## 2018-05-23 ENCOUNTER — Other Ambulatory Visit (HOSPITAL_COMMUNITY): Payer: Self-pay | Admitting: *Deleted

## 2018-05-24 ENCOUNTER — Ambulatory Visit (HOSPITAL_COMMUNITY)
Admission: RE | Admit: 2018-05-24 | Discharge: 2018-05-24 | Disposition: A | Discharge status: Home / Self Care | Payer: Medicare Other | Source: Ambulatory Visit | Attending: Rheumatology | Admitting: Rheumatology

## 2018-05-24 DIAGNOSIS — D8686 Sarcoid arthropathy: Secondary | ICD-10-CM | POA: Diagnosis not present

## 2018-05-24 LAB — CBC
HCT: 41.1 % (ref 36.0–46.0)
HEMOGLOBIN: 13.6 g/dL (ref 12.0–15.0)
MCH: 30.4 pg (ref 26.0–34.0)
MCHC: 33.1 g/dL (ref 30.0–36.0)
MCV: 91.9 fL (ref 78.0–100.0)
Platelets: 294 10*3/uL (ref 150–400)
RBC: 4.47 MIL/uL (ref 3.87–5.11)
RDW: 13.4 % (ref 11.5–15.5)
WBC: 6.6 10*3/uL (ref 4.0–10.5)

## 2018-05-24 LAB — COMPREHENSIVE METABOLIC PANEL
ALBUMIN: 4.1 g/dL (ref 3.5–5.0)
ALK PHOS: 61 U/L (ref 38–126)
ALT: 18 U/L (ref 0–44)
AST: 23 U/L (ref 15–41)
Anion gap: 8 (ref 5–15)
BUN: 15 mg/dL (ref 8–23)
CALCIUM: 9.2 mg/dL (ref 8.9–10.3)
CHLORIDE: 106 mmol/L (ref 98–111)
CO2: 28 mmol/L (ref 22–32)
CREATININE: 0.79 mg/dL (ref 0.44–1.00)
GFR calc Af Amer: >60 mL/min (ref >60)
GFR calc non Af Amer: >60 mL/min (ref >60)
GLUCOSE: 165 mg/dL — AB (ref 70–99)
Potassium: 3.5 mmol/L (ref 3.5–5.1)
SODIUM: 142 mmol/L (ref 135–145)
Total Bilirubin: 1 mg/dL (ref 0.3–1.2)
Total Protein: 6.7 g/dL (ref 6.5–8.1)

## 2018-05-24 MED ORDER — ACETAMINOPHEN 325 MG PO TABS
ORAL_TABLET | ORAL | Status: AC
  Filled 2018-05-24: qty 2

## 2018-05-24 MED ORDER — ACETAMINOPHEN 325 MG PO TABS
650.0000 mg | ORAL_TABLET | ORAL | Status: DC
  Administered 2018-05-24: 650 mg via ORAL

## 2018-05-24 MED ORDER — DIPHENHYDRAMINE HCL 25 MG PO CAPS
ORAL_CAPSULE | ORAL | Status: AC
  Filled 2018-05-24: qty 1

## 2018-05-24 MED ORDER — SODIUM CHLORIDE 0.9 % IV SOLN: INTRAVENOUS | Status: DC

## 2018-05-24 MED ORDER — SODIUM CHLORIDE 0.9 % IV SOLN
3.0000 mg/kg | INTRAVENOUS | Status: AC
  Administered 2018-05-24: 200 mg via INTRAVENOUS
  Filled 2018-05-24: qty 20

## 2018-05-24 MED ORDER — DIPHENHYDRAMINE HCL 25 MG PO CAPS
25.0000 mg | ORAL_CAPSULE | ORAL | Status: DC
  Administered 2018-05-24: 25 mg via ORAL

## 2018-05-24 NOTE — Progress Notes (Signed)
Glucose is elevated.  Please advise patient.  Please fax results to her PCP.

## 2018-06-03 ENCOUNTER — Other Ambulatory Visit: Payer: Self-pay | Admitting: Rheumatology

## 2018-06-06 NOTE — Telephone Encounter (Signed)
Last Visit: 12/17/17 Next Visit: 06/24/18 Labs: 05/24/18 Glucose is 165. All other labs are WNL.  Okay to refill per Dr. Corliss Skains

## 2018-06-10 NOTE — Progress Notes (Signed)
Office Visit Note  Patient: Renee Jones             Date of Birth: January 10, 1951           MRN: [id number]             PCP: Clayborn Heron, MD Referring: Clayborn Heron, MD Visit Date: 06/24/2018 Occupation: @GUAROCC @  Subjective:  Left shoulder pain   History of Present Illness: Renee Jones is a 67 y.o. female with history of uveitis, sarcoidosis, osteoarthritis, and DDD. She is on MTX 10 tablets po once a week, folic acid 2 mg daily, and Remicade IV every 6 weeks.   She reports she is having a left rotator cuff repair 1 week from today.  She states her last dose of MTX was last week and her last infusion of Remicade was over 1 month ago.   Her left shoulder has been causing significant pain at night.  She has very limited ROM.  She has been having increased arthralgias.  She is having pain in her left knee and bilateral hands.  She denies any joint swelling.  She has no joint stiffness.  She states that she would be scheduling her Hyalgan bilateral knee injections after she has had her rotator cuff repair.  She denies any recent flares of uveitis.  She states she continues to have significant eye dryness and had plugging as well as using drops in the daily basis.  She denies any eye redness or pain at this time.  She denies any new pulmonary symptoms.  She denies any shortness of breath or coughing.  She states that she does have a swollen lymph node on the left side of her neck that is tender and has been aching for the past 2 weeks. She continues to have lower back pain and stiffness.  She has intermittent symptoms of left-sided sciatica.  She opted out of having an final cord stimulator placed.  She has been using heating pad on Voltaren gel.  She has been taking Aleve as needed.   Activities of Daily Living:  Patient reports morning stiffness for 0  minutes.   Patient Reports nocturnal pain.  Difficulty dressing/grooming: Denies Difficulty climbing stairs:  Reports Difficulty getting out of chair: Reports Difficulty using hands for taps, buttons, cutlery, and/or writing: Denies  Review of Systems  Constitutional: Negative for fatigue.  HENT: Negative for mouth sores, mouth dryness and nose dryness.   Eyes: Negative for pain, visual disturbance and dryness.  Respiratory: Negative for cough, hemoptysis, shortness of breath and difficulty breathing.   Cardiovascular: Negative for chest pain, palpitations, hypertension and swelling in legs/feet.  Gastrointestinal: Negative for blood in stool, constipation and diarrhea.  Endocrine: Negative for increased urination.  Genitourinary: Negative for painful urination.  Musculoskeletal: Negative for arthralgias, joint pain, joint swelling, myalgias, muscle weakness, morning stiffness, muscle tenderness and myalgias.  Skin: Negative for color change, pallor, rash, hair loss, nodules/bumps, skin tightness, ulcers and sensitivity to sunlight.  Allergic/Immunologic: Negative for susceptible to infections.  Neurological: Negative for dizziness, numbness, headaches and weakness.  Hematological: Negative for swollen glands.  Psychiatric/Behavioral: Negative for depressed mood and sleep disturbance. The patient is not nervous/anxious.     PMFS History:  Patient Active Problem List   Diagnosis Date Noted  . High risk medications (not anticoagulants) long-term use 01/13/2017  . GERD (gastroesophageal reflux disease) 09/14/2016  . Bronchial spasm 09/14/2016  . Glaucoma 09/14/2016  . Detached retina 09/14/2016  . Osteoarthritis of  both hands 09/14/2016  . Osteoarthritis of both knees 09/14/2016  . DDD (degenerative disc disease), lumbar 09/14/2016  . Uveitis 06/22/2016  . SVT (supraventricular tachycardia) (HCC) 08/28/2015  . Essential hypertension 08/28/2015  . Diastolic dysfunction 08/28/2015  . Sarcoidosis 09/30/2012  . Pulmonary nodules 09/30/2012  . Tachycardia 09/30/2012  . Sarcoid arthropathy  09/30/2012    Past Medical History:  Diagnosis Date  . Allergy   . Asthma   . Bronchial spasm 09/14/2016  . DDD (degenerative disc disease), lumbar 09/14/2016  . Detached retina 09/14/2016  . Diastolic dysfunction 08/28/2015  . Essential hypertension 08/28/2015  . GERD (gastroesophageal reflux disease)   . Glaucoma 09/14/2016  . Hypertension   . Lumbar disc disease   . Neuromuscular disorder (HCC)   . Osteoarthritis of both hands 09/14/2016  . Osteoarthritis of both knees 09/14/2016  . Sarcoidosis   . Sarcoidosis    with +lymph node biopsy  . SVT (supraventricular tachycardia) (HCC) 08/28/2015  . Tachycardia   . Uveitis     Family History  Adopted: Yes  Problem Relation Age of Onset  . Scleroderma Daughter    Past Surgical History:  Procedure Laterality Date  . CYSTECTOMY    . EYE SURGERY Bilateral 2018   cataract removal   . LUMBAR SPINE SURGERY    . ROTATOR CUFF REPAIR    . TOTAL ABDOMINAL HYSTERECTOMY     Social History   Social History Narrative  . Not on file    Objective: Vital Signs: BP (!) 141/79 (BP Location: Right Arm, Patient Position: Sitting, Cuff Size: Normal)   Pulse 71   Resp 13   Ht 5' 3.5" (1.613 m)   Wt 139 lb (63 kg)   BMI 24.24 kg/m    Physical Exam  Constitutional: She is oriented to person, place, and time. She appears well-developed and well-nourished.  HENT:  Head: Normocephalic and atraumatic.  Eyes: Conjunctivae and EOM are normal.  Neck: Normal range of motion.  Cardiovascular: Normal rate, regular rhythm, normal heart sounds and intact distal pulses.  Pulmonary/Chest: Effort normal and breath sounds normal.  Abdominal: Soft. Bowel sounds are normal.  Lymphadenopathy:    She has cervical adenopathy.  Neurological: She is alert and oriented to person, place, and time.  Skin: Skin is warm and dry. Capillary refill takes less than 2 seconds.  Psychiatric: She has a normal mood and affect. Her behavior is normal.  Nursing note  and vitals reviewed.    Musculoskeletal Exam: C-spine limited range of motion with lateral rotation.  Thoracic and lumbar spine good range of motion.  No midline spinal tenderness.  She has mild SI joint tenderness on the left side.  Right shoulder full range of motion with no discomfort.  Left shoulder very limited range of motion with severe pain.  Elbow joints, wrist joints, MCPs, PIPs, DIPs good range of motion with no synovitis.  She is complete fist formation bilaterally.  She has mild PIP and DIP synovial thickening consistent with osteoarthritis of bilateral hands.  Hip joints, knee joints, ankle joints, MTPs, PIPs, DIPs good range of motion with no synovitis.  No warmth or effusion of bilateral knee joints.  No tenderness of trochanteric bursa on exam.  CDAI Exam: CDAI Score: Not documented Patient Global Assessment: Not documented; Provider Global Assessment: Not documented Swollen: Not documented; Tender: Not documented Joint Exam   Not documented   There is currently no information documented on the homunculus. Go to the Rheumatology activity and complete the homunculus  joint exam.  Investigation: No additional findings.  Imaging: No results found.  Recent Labs: Lab Results  Component Value Date   WBC 6.6 05/24/2018   HGB 13.6 05/24/2018   PLT 294 05/24/2018   NA 142 05/24/2018   K 3.5 05/24/2018   CL 106 05/24/2018   CO2 28 05/24/2018   GLUCOSE 165 (H) 05/24/2018   BUN 15 05/24/2018   CREATININE 0.79 05/24/2018   BILITOT 1.0 05/24/2018   ALKPHOS 61 05/24/2018   AST 23 05/24/2018   ALT 18 05/24/2018   PROT 6.7 05/24/2018   ALBUMIN 4.1 05/24/2018   CALCIUM 9.2 05/24/2018   GFRAA >60 05/24/2018   QFTBGOLD Negative 06/16/2017    Speciality Comments: REMICADE 3 mg/kg x 6 weeks TB GOLD negative 06/16/2017  Procedures:  No procedures performed Allergies: Augmentin [amoxicillin-pot clavulanate]; Codeine; Erythromycin; Sulfa antibiotics; and Tramadol    Assessment / Plan:     Visit Diagnoses: Uveitis: She has not had any recent flares of uveitis.  She has significant eye dryness and has had plugging in the past as well as using eyedrops several times a day.  She denies any eye pain or eye redness at this time.  She continues to follow-up with her ophthalmologist on a regular basis.  Her last Remicade infusion was over 1 month ago and her last dose of methotrexate was last week.  She is scheduled for a left rotator cuff repair 1 week from today she was advised to hold her medications.  She will resume her medications when she is cleared by Dr. Rennis Chris.  Sarcoidosis: She has no new or worsening pulmonary symptoms.  Lungs are clear to auscultation. She has tender swollen lymph node on the left side of her neck.  She will resume Remicade infusions every 6 weeks and methotrexate 8 tablets by mouth once weekly when she is cleared by her orthopedic surgeon.  High risk medications (not anticoagulants) long-term use - MTX 8 tablets po once a week, folic acid 2 mg daily, Remicade IV every 6 weeks.  CBC and CMP were drawn on 05/24/2018.  TB gold was negative on 06/16/2017.  Future order for TB gold was placed.  Refills of methotrexate and folic acid were sent to the pharmacy today.  Pulmonary nodules  Primary osteoarthritis of both hands: Mild PIP and DIP synovial thickening consistent with osteoarthritis of bilateral hands.  She is been having increased discomfort in bilateral hands but has not noticed any joint swelling.  No synovitis was noted.  Joint protection and muscle strengthening were discussed.  Primary osteoarthritis of both knees: She is good range of motion with no discomfort.  No warmth or effusion noted.  She is been having increased left knee pain.  She would like to proceed with Hyalgan injections in bilateral knee joints following her left rotator cuff repair surgery.  She will notify us when she would like to schedule the appointment for the  injections.  A refill of Voltaren gel was sent to the pharmacy today.  DDD (degenerative disc disease), lumbar: Chronic pain.  She opted out of having a spinal cord stimulator placed.  She takes Aleve as needed for pain relief.  She also uses heating pad of Voltaren gel.  A prescription for Voltaren gel sent to the pharmacy today.  Complete tear of left rotator cuff, unspecified whether traumatic: She has a left rotator cuff repair surgery scheduled 1 week from today.  Dr. Rennis Chris will be performing the surgery.  Her last dose of MTX  was last week and her last Remicade infusion was over 1 month ago.   History of repair of right rotator cuff: Dr. Rennis Chris, Doing well.  She has good ROM with no discomfort at this time.    Other medical conditions are listed as follows:   SVT (supraventricular tachycardia) (HCC)  Essential hypertension  Gastroesophageal reflux disease without esophagitis    Orders: No orders of the defined types were placed in this encounter.  Meds ordered this encounter  Medications  . diclofenac sodium (VOLTAREN) 1 % GEL    Sig: Apply three grams to three large joints up to three times daily.    Dispense:  3 Tube    Refill:  3  . methotrexate (RHEUMATREX) 2.5 MG tablet    Sig: TAKE 10 TABLETS BY MOUTH ONCE A WEEK    Dispense:  120 tablet    Refill:  0  . folic acid (FOLVITE) 1 MG tablet    Sig: Take 2 tablets (2 mg total) by mouth daily.    Dispense:  180 tablet    Refill:  4    Face-to-face time spent with patient was 30 minutes. Greater than 50% of time was spent in counseling and coordination of care.  Follow-Up Instructions: Return in about 5 months (around 11/24/2018) for Uveitis, Sarcoidosis, osteoarthritis, and DDD.   Gearldine Bienenstock, PA-C  Note - This record has been created using Dragon software.  Chart creation errors have been sought, but may not always  have been located. Such creation errors do not reflect on  the standard of medical care.

## 2018-06-11 ENCOUNTER — Other Ambulatory Visit: Payer: Self-pay | Admitting: Cardiovascular Disease

## 2018-06-14 ENCOUNTER — Other Ambulatory Visit: Payer: Self-pay | Admitting: *Deleted

## 2018-06-14 DIAGNOSIS — D8686 Sarcoid arthropathy: Secondary | ICD-10-CM

## 2018-06-19 HISTORY — PX: ROTATOR CUFF REPAIR: SHX139

## 2018-06-24 ENCOUNTER — Encounter: Payer: Self-pay | Admitting: Physician Assistant

## 2018-06-24 ENCOUNTER — Ambulatory Visit (INDEPENDENT_AMBULATORY_CARE_PROVIDER_SITE_OTHER): Payer: Medicare Other | Admitting: Physician Assistant

## 2018-06-24 ENCOUNTER — Ambulatory Visit: Payer: PRIVATE HEALTH INSURANCE | Admitting: Physician Assistant

## 2018-06-24 VITALS — BP 141/79 | HR 71 | Resp 13 | Ht 63.5 in | Wt 139.0 lb

## 2018-06-24 DIAGNOSIS — Z9889 Other specified postprocedural states: Secondary | ICD-10-CM | POA: Diagnosis not present

## 2018-06-24 DIAGNOSIS — I10 Essential (primary) hypertension: Secondary | ICD-10-CM | POA: Diagnosis not present

## 2018-06-24 DIAGNOSIS — H209 Unspecified iridocyclitis: Secondary | ICD-10-CM | POA: Diagnosis not present

## 2018-06-24 DIAGNOSIS — M5136 Other intervertebral disc degeneration, lumbar region: Secondary | ICD-10-CM

## 2018-06-24 DIAGNOSIS — M170 Bilateral primary osteoarthritis of knee: Secondary | ICD-10-CM

## 2018-06-24 DIAGNOSIS — Z79899 Other long term (current) drug therapy: Secondary | ICD-10-CM

## 2018-06-24 DIAGNOSIS — R918 Other nonspecific abnormal finding of lung field: Secondary | ICD-10-CM

## 2018-06-24 DIAGNOSIS — I471 Supraventricular tachycardia, unspecified: Secondary | ICD-10-CM

## 2018-06-24 DIAGNOSIS — D869 Sarcoidosis, unspecified: Secondary | ICD-10-CM | POA: Diagnosis not present

## 2018-06-24 DIAGNOSIS — K219 Gastro-esophageal reflux disease without esophagitis: Secondary | ICD-10-CM | POA: Diagnosis not present

## 2018-06-24 DIAGNOSIS — M75122 Complete rotator cuff tear or rupture of left shoulder, not specified as traumatic: Secondary | ICD-10-CM | POA: Diagnosis not present

## 2018-06-24 DIAGNOSIS — M19041 Primary osteoarthritis, right hand: Secondary | ICD-10-CM | POA: Diagnosis not present

## 2018-06-24 DIAGNOSIS — M19042 Primary osteoarthritis, left hand: Secondary | ICD-10-CM

## 2018-06-24 DIAGNOSIS — M51369 Other intervertebral disc degeneration, lumbar region without mention of lumbar back pain or lower extremity pain: Secondary | ICD-10-CM

## 2018-06-24 MED ORDER — FOLIC ACID 1 MG PO TABS: 2.0000 mg | ORAL_TABLET | Freq: Every day | ORAL | 4 refills | Status: DC

## 2018-06-24 MED ORDER — METHOTREXATE 2.5 MG PO TABS: ORAL_TABLET | ORAL | 0 refills | Status: DC

## 2018-06-24 MED ORDER — DICLOFENAC SODIUM 1 % TD GEL: TRANSDERMAL | 3 refills | Status: DC

## 2018-06-28 ENCOUNTER — Other Ambulatory Visit: Payer: Self-pay | Admitting: Cardiovascular Disease

## 2018-06-28 MED ORDER — METOPROLOL TARTRATE 50 MG PO TABS: ORAL_TABLET | ORAL | 0 refills | Status: DC

## 2018-06-28 NOTE — Telephone Encounter (Signed)
New message    *STAT* If patient is at the pharmacy, call can be transferred to refill team.   1. Which medications need to be refilled? (please list name of each medication and dose if known) metoprolol tartrate (LOPRESSOR) 50 MG tablet  2. Which pharmacy/location (including street and city if local pharmacy) is medication to be sent to?[id number] - [place], [place] - [address] OF [address]  3. Do they need a 30 day or 90 day supply? 90

## 2018-06-28 NOTE — Addendum Note (Signed)
Addended by: Raelyn Number on: 06/28/2018 02:35 PM   Modules accepted: Orders

## 2018-07-01 DIAGNOSIS — M7542 Impingement syndrome of left shoulder: Secondary | ICD-10-CM | POA: Diagnosis not present

## 2018-07-01 DIAGNOSIS — M75122 Complete rotator cuff tear or rupture of left shoulder, not specified as traumatic: Secondary | ICD-10-CM | POA: Diagnosis not present

## 2018-07-01 DIAGNOSIS — M75112 Incomplete rotator cuff tear or rupture of left shoulder, not specified as traumatic: Secondary | ICD-10-CM | POA: Diagnosis not present

## 2018-07-01 DIAGNOSIS — M24112 Other articular cartilage disorders, left shoulder: Secondary | ICD-10-CM | POA: Diagnosis not present

## 2018-07-01 DIAGNOSIS — M19012 Primary osteoarthritis, left shoulder: Secondary | ICD-10-CM | POA: Diagnosis not present

## 2018-07-01 DIAGNOSIS — G8918 Other acute postprocedural pain: Secondary | ICD-10-CM | POA: Diagnosis not present

## 2018-07-01 DIAGNOSIS — S43432A Superior glenoid labrum lesion of left shoulder, initial encounter: Secondary | ICD-10-CM | POA: Diagnosis not present

## 2018-07-05 ENCOUNTER — Encounter (HOSPITAL_COMMUNITY): Payer: Medicare Other

## 2018-07-11 DIAGNOSIS — M25512 Pain in left shoulder: Secondary | ICD-10-CM | POA: Diagnosis not present

## 2018-07-19 DIAGNOSIS — M25512 Pain in left shoulder: Secondary | ICD-10-CM | POA: Diagnosis not present

## 2018-07-25 ENCOUNTER — Ambulatory Visit: Payer: Medicare Other | Admitting: Cardiology

## 2018-07-27 ENCOUNTER — Other Ambulatory Visit: Payer: Self-pay | Admitting: Cardiovascular Disease

## 2018-07-27 ENCOUNTER — Ambulatory Visit (HOSPITAL_COMMUNITY)
Admission: RE | Admit: 2018-07-27 | Discharge: 2018-07-27 | Disposition: A | Discharge status: Home / Self Care | Payer: Medicare Other | Source: Ambulatory Visit | Attending: Rheumatology | Admitting: Rheumatology

## 2018-07-27 ENCOUNTER — Other Ambulatory Visit: Payer: Self-pay | Admitting: Cardiology

## 2018-07-27 DIAGNOSIS — D8686 Sarcoid arthropathy: Secondary | ICD-10-CM | POA: Diagnosis not present

## 2018-07-27 DIAGNOSIS — M25512 Pain in left shoulder: Secondary | ICD-10-CM | POA: Diagnosis not present

## 2018-07-27 LAB — COMPREHENSIVE METABOLIC PANEL
ALBUMIN: 3.8 g/dL (ref 3.5–5.0)
ALT: 20 U/L (ref 0–44)
ANION GAP: 8 (ref 5–15)
AST: 23 U/L (ref 15–41)
Alkaline Phosphatase: 56 U/L (ref 38–126)
BILIRUBIN TOTAL: 0.8 mg/dL (ref 0.3–1.2)
BUN: 16 mg/dL (ref 8–23)
CO2: 23 mmol/L (ref 22–32)
Calcium: 9 mg/dL (ref 8.9–10.3)
Chloride: 110 mmol/L (ref 98–111)
Creatinine, Ser: 0.74 mg/dL (ref 0.44–1.00)
GLUCOSE: 114 mg/dL — AB (ref 70–99)
Potassium: 3.7 mmol/L (ref 3.5–5.1)
Sodium: 141 mmol/L (ref 135–145)
TOTAL PROTEIN: 6.6 g/dL (ref 6.5–8.1)

## 2018-07-27 LAB — CBC
HEMATOCRIT: 39.9 % (ref 36.0–46.0)
Hemoglobin: 12.9 g/dL (ref 12.0–15.0)
MCH: 29.3 pg (ref 26.0–34.0)
MCHC: 32.3 g/dL (ref 30.0–36.0)
MCV: 90.5 fL (ref 80.0–100.0)
Platelets: 312 10*3/uL (ref 150–400)
RBC: 4.41 MIL/uL (ref 3.87–5.11)
RDW: 13.2 % (ref 11.5–15.5)
WBC: 5.8 10*3/uL (ref 4.0–10.5)
nRBC: 0 % (ref 0.0–0.2)

## 2018-07-27 MED ORDER — DIPHENHYDRAMINE HCL 25 MG PO CAPS
ORAL_CAPSULE | ORAL | Status: AC
  Filled 2018-07-27: qty 1

## 2018-07-27 MED ORDER — ACETAMINOPHEN 325 MG PO TABS
ORAL_TABLET | ORAL | Status: AC
  Filled 2018-07-27: qty 2

## 2018-07-27 MED ORDER — SODIUM CHLORIDE 0.9 % IV SOLN
3.0000 mg/kg | INTRAVENOUS | Status: DC
  Administered 2018-07-27: 200 mg via INTRAVENOUS
  Filled 2018-07-27: qty 20

## 2018-07-27 MED ORDER — ACETAMINOPHEN 325 MG PO TABS
650.0000 mg | ORAL_TABLET | ORAL | Status: DC
  Administered 2018-07-27: 650 mg via ORAL

## 2018-07-27 MED ORDER — DIPHENHYDRAMINE HCL 25 MG PO CAPS
25.0000 mg | ORAL_CAPSULE | ORAL | Status: DC
  Administered 2018-07-27: 25 mg via ORAL

## 2018-07-27 NOTE — Telephone Encounter (Signed)
This is Dr. Ambrose's pt.  °

## 2018-07-29 LAB — QUANTIFERON-TB GOLD PLUS (RQFGPL)
QUANTIFERON NIL VALUE: 0.04 [IU]/mL
QUANTIFERON TB2 AG VALUE: 0.04 [IU]/mL
QuantiFERON TB1 Ag Value: 0.04 IU/mL

## 2018-07-29 LAB — QUANTIFERON-TB GOLD PLUS: QuantiFERON-TB Gold Plus: NEGATIVE

## 2018-08-02 ENCOUNTER — Encounter: Payer: Self-pay | Admitting: Cardiology

## 2018-08-02 ENCOUNTER — Ambulatory Visit (INDEPENDENT_AMBULATORY_CARE_PROVIDER_SITE_OTHER): Payer: Medicare Other | Admitting: Cardiology

## 2018-08-02 VITALS — BP 124/64 | HR 64 | Ht 63.0 in | Wt 139.0 lb

## 2018-08-02 DIAGNOSIS — I471 Supraventricular tachycardia: Secondary | ICD-10-CM

## 2018-08-02 DIAGNOSIS — I10 Essential (primary) hypertension: Secondary | ICD-10-CM | POA: Diagnosis not present

## 2018-08-02 DIAGNOSIS — M25512 Pain in left shoulder: Secondary | ICD-10-CM | POA: Diagnosis not present

## 2018-08-02 DIAGNOSIS — I5032 Chronic diastolic (congestive) heart failure: Secondary | ICD-10-CM

## 2018-08-02 NOTE — Progress Notes (Signed)
Electrophysiology Office Note   Date:  08/02/2018   ID:  Renee Jones, DOB 1951-03-02, MRN [id number]  PCP:  Renee Lemming, MD  Cardiologist:  Renee Jones Primary Electrophysiologist:  Renee Aron Jorja Loa, MD    No chief complaint on file.    History of Present Illness: Renee Jones is a 67 y.o. female who presents today for electrophysiology evaluation.   Referred for evaluation of SVT. She was started on metoprolol for a long RP tachycardia.   Today, denies symptoms of palpitations, chest pain, shortness of breath, orthopnea, PND, lower extremity edema, claudication, dizziness, presyncope, syncope, bleeding, or neurologic sequela. The patient is tolerating medications without difficulties.  She is overall feeling well.  She is noted no further episodes of SVT.  She recently had rotator cuff surgery.  I told her that when she comes back to see me in clinic she Renee Jones be expected to hold her left arm above her head.   Past Medical History:  Diagnosis Date  . Allergy   . Asthma   . Bronchial spasm 09/14/2016  . DDD (degenerative disc disease), lumbar 09/14/2016  . Detached retina 09/14/2016  . Diastolic dysfunction 08/28/2015  . Essential hypertension 08/28/2015  . GERD (gastroesophageal reflux disease)   . Glaucoma 09/14/2016  . Hypertension   . Lumbar disc disease   . Neuromuscular disorder (HCC)   . Osteoarthritis of both hands 09/14/2016  . Osteoarthritis of both knees 09/14/2016  . Sarcoidosis   . Sarcoidosis    with +lymph node biopsy  . SVT (supraventricular tachycardia) (HCC) 08/28/2015  . Tachycardia   . Uveitis    Past Surgical History:  Procedure Laterality Date  . CYSTECTOMY    . EYE SURGERY Bilateral 2018   cataract removal   . LUMBAR SPINE SURGERY    . ROTATOR CUFF REPAIR    . TOTAL ABDOMINAL HYSTERECTOMY       Current Outpatient Medications  Medication Sig Dispense Refill  . albuterol (PROVENTIL HFA;VENTOLIN HFA) 108 (90 BASE)  MCG/ACT inhaler Inhale 2 puffs into the lungs every 6 (six) hours as needed for wheezing or shortness of breath.    . cholecalciferol (VITAMIN D) 1000 units tablet Take 1,000 Units by mouth daily.    . diclofenac sodium (VOLTAREN) 1 % GEL Apply three grams to three large joints up to three times daily. 3 Tube 3  . fluticasone (FLONASE) 50 MCG/ACT nasal spray Place 2 sprays into both nostrils daily as needed for allergies or rhinitis.    . folic acid (FOLVITE) 1 MG tablet Take 2 tablets (2 mg total) by mouth daily. 180 tablet 4  . hydrochlorothiazide (MICROZIDE) 12.5 MG capsule TAKE 1 CAPSULE(12.5 MG) BY MOUTH DAILY 90 capsule 0  . InFLIXimab (REMICADE IV) Inject into the vein every 6 (six) weeks.    . methotrexate (RHEUMATREX) 2.5 MG tablet Take 10 tablets (25 mg total) by mouth once a week. Caution:Chemotherapy. Protect from light. 120 tablet 0  . metoprolol tartrate (LOPRESSOR) 50 MG tablet TAKE 1 1/2 TABLETS BY MOUTH TWICE DAILY 270 tablet 0  . Multiple Vitamin (MULTIVITAMIN WITH MINERALS) TABS tablet Take 1 tablet by mouth daily.    . ondansetron (ZOFRAN) 4 MG tablet Take 4 mg by mouth as needed for nausea or vomiting.    . pantoprazole (PROTONIX) 40 MG tablet Take 40 mg by mouth daily.  5   No current facility-administered medications for this visit.     Allergies:   Augmentin [amoxicillin-pot clavulanate]; Codeine; Erythromycin; Sulfa  antibiotics; and Tramadol   Social History:  The patient  reports that she has never smoked. She has never used smokeless tobacco. She reports that she does not drink alcohol or use drugs.   Family History:  The patient's family history includes Scleroderma in her daughter. She was adopted.    ROS:  Please see the history of present illness.   Otherwise, review of systems is positive for back pain.   All other systems are reviewed and negative.   PHYSICAL EXAM: VS:  BP 124/64   Pulse 64   Ht 5\' 3"  (1.6 m)   Wt 139 lb (63 kg)   BMI 24.62 kg/m  , BMI  Body mass index is 24.62 kg/m. GEN: Well nourished, well developed, in no acute distress  HEENT: normal  Neck: no JVD, carotid bruits, or masses Cardiac: RRR; no murmurs, rubs, or gallops,no edema  Respiratory:  clear to auscultation bilaterally, normal work of breathing GI: soft, nontender, nondistended, + BS MS: no deformity or atrophy  Skin: warm and dry Neuro:  Strength and sensation are intact Psych: euthymic mood, full affect  EKG:  EKG is ordered today. Personal review of the ekg ordered shows sinus rhythm, rate 64, poor R wave progression  Recent Labs: 07/27/2018: ALT 20; BUN 16; Creatinine, Ser 0.74; Hemoglobin 12.9; Platelets 312; Potassium 3.7; Sodium 141    Lipid Panel  No results found for: CHOL, TRIG, HDL, CHOLHDL, VLDL, LDLCALC, LDLDIRECT   Wt Readings from Last 3 Encounters:  08/02/18 139 lb (63 kg)  07/27/18 139 lb (63 kg)  06/24/18 139 lb (63 kg)      Other studies Reviewed: Additional studies/ records that were reviewed today include: TTE 09/03/15  Review of the above records today demonstrates:  - Normal LV systolic function; grade 1 diastolic dysfunction; mild   MR.  2014 CMRI 1. Normal LV size and systolic function, EF 69%.  2. Normal RV size and systolic function.  3. No myocardial delayed enhancement, so no definitive evidence for prior MI, infiltrative disease, or myocarditis.  4. No evidence on this exam for cardiac sarcoidosis.  ASSESSMENT AND PLAN:  1.  SVT: Times as last being seen.  Continue with metoprolol.  2. Hypertension: Controlled.  No changes.  3. Diastolic dysfunction: Grade 1 on metoprolol.  No signs of volume overload   Current medicines are reviewed at length with the patient today.   The patient does not have concerns regarding her medicines.  The following changes were made today: None  Labs/ tests ordered today include:  Orders Placed This Encounter  Procedures  . EKG 12-Lead     Disposition:   FU with  Renee Jones  12 months  Signed, Renee Jones 09/05/15, MD  08/02/2018 4:12 PM     [facility] [address] [place] [phone] (office) [phone] (fax)

## 2018-08-02 NOTE — Patient Instructions (Addendum)
Medication Instructions:  Your physician recommends that you continue on your current medications as directed. Please refer to the Current Medication list given to you today.  If you need a refill on your cardiac medications before your next appointment, please call your pharmacy.   Lab work: None ordered  Testing/Procedures: None ordered  Follow-Up: At [facility], you and your health needs are our priority.  As part of our continuing mission to provide you with exceptional heart care, we have created designated Provider Care Teams.  These Care Teams include your primary Cardiologist (physician) and Advanced Practice Providers (APPs -  Physician Assistants and Nurse Practitioners) who all work together to provide you with the care you need, when you need it. You will need a follow up appointment in 1 years.  Please call our office 2 months in advance to schedule this appointment.  You may see Dr. Camnitz or one of the following Advanced Practice Providers on your designated Care Team:   Amber Seiler, NP . Renee Ursuy, PA-C  Thank you for choosing [facility]!!   Shiron Whetsel, RN [phone]      

## 2018-08-09 DIAGNOSIS — M25512 Pain in left shoulder: Secondary | ICD-10-CM | POA: Diagnosis not present

## 2018-08-16 DIAGNOSIS — Z1231 Encounter for screening mammogram for malignant neoplasm of breast: Secondary | ICD-10-CM | POA: Diagnosis not present

## 2018-08-17 DIAGNOSIS — M25512 Pain in left shoulder: Secondary | ICD-10-CM | POA: Diagnosis not present

## 2018-08-23 DIAGNOSIS — M25512 Pain in left shoulder: Secondary | ICD-10-CM | POA: Diagnosis not present

## 2018-08-25 DIAGNOSIS — M25512 Pain in left shoulder: Secondary | ICD-10-CM | POA: Diagnosis not present

## 2018-08-30 DIAGNOSIS — M25512 Pain in left shoulder: Secondary | ICD-10-CM | POA: Diagnosis not present

## 2018-09-01 DIAGNOSIS — M25512 Pain in left shoulder: Secondary | ICD-10-CM | POA: Diagnosis not present

## 2018-09-02 ENCOUNTER — Other Ambulatory Visit: Payer: Self-pay | Admitting: *Deleted

## 2018-09-02 NOTE — Progress Notes (Signed)
Infusion orders are current for patient CBC CMP Tylenol Benadryl appointments are up to date and follow up appointment  is scheduled TB gold not due yet.  

## 2018-09-06 ENCOUNTER — Ambulatory Visit (HOSPITAL_COMMUNITY)
Admission: RE | Admit: 2018-09-06 | Discharge: 2018-09-06 | Disposition: A | Discharge status: Home / Self Care | Payer: Medicare Other | Source: Ambulatory Visit | Attending: Rheumatology | Admitting: Rheumatology

## 2018-09-06 DIAGNOSIS — D8686 Sarcoid arthropathy: Secondary | ICD-10-CM | POA: Diagnosis not present

## 2018-09-06 DIAGNOSIS — M25512 Pain in left shoulder: Secondary | ICD-10-CM | POA: Diagnosis not present

## 2018-09-06 MED ORDER — SODIUM CHLORIDE 0.9 % IV SOLN
3.0000 mg/kg | INTRAVENOUS | Status: DC
  Administered 2018-09-06: 200 mg via INTRAVENOUS
  Filled 2018-09-06: qty 20

## 2018-09-06 MED ORDER — SODIUM CHLORIDE 0.9 % IV SOLN
3.0000 mg/kg | INTRAVENOUS | Status: DC
  Filled 2018-09-06: qty 20

## 2018-09-06 MED ORDER — ACETAMINOPHEN 325 MG PO TABS
650.0000 mg | ORAL_TABLET | ORAL | Status: DC
  Administered 2018-09-06: 650 mg via ORAL

## 2018-09-06 MED ORDER — DIPHENHYDRAMINE HCL 25 MG PO CAPS
ORAL_CAPSULE | ORAL | Status: AC
  Administered 2018-09-06: 25 mg via ORAL
  Filled 2018-09-06: qty 1

## 2018-09-06 MED ORDER — DIPHENHYDRAMINE HCL 25 MG PO CAPS
25.0000 mg | ORAL_CAPSULE | ORAL | Status: DC
  Administered 2018-09-06: 25 mg via ORAL

## 2018-09-06 MED ORDER — ACETAMINOPHEN 325 MG PO TABS
ORAL_TABLET | ORAL | Status: AC
  Administered 2018-09-06: 650 mg via ORAL
  Filled 2018-09-06: qty 2

## 2018-09-07 ENCOUNTER — Encounter (HOSPITAL_COMMUNITY): Payer: Medicare Other

## 2018-09-08 DIAGNOSIS — M25512 Pain in left shoulder: Secondary | ICD-10-CM | POA: Diagnosis not present

## 2018-09-13 DIAGNOSIS — M25512 Pain in left shoulder: Secondary | ICD-10-CM | POA: Diagnosis not present

## 2018-09-18 ENCOUNTER — Other Ambulatory Visit: Payer: Self-pay | Admitting: Rheumatology

## 2018-09-19 NOTE — Telephone Encounter (Signed)
Last Visit: 06/24/18 Next visit: 11/24/18 Labs: 07/27/18 Glucose is 114. All other labs are WNL.  Okay to refill per Dr. Corliss Skains

## 2018-09-20 DIAGNOSIS — M25512 Pain in left shoulder: Secondary | ICD-10-CM | POA: Diagnosis not present

## 2018-09-22 DIAGNOSIS — M25512 Pain in left shoulder: Secondary | ICD-10-CM | POA: Diagnosis not present

## 2018-09-26 DIAGNOSIS — M25512 Pain in left shoulder: Secondary | ICD-10-CM | POA: Diagnosis not present

## 2018-09-30 DIAGNOSIS — M25512 Pain in left shoulder: Secondary | ICD-10-CM | POA: Diagnosis not present

## 2018-10-07 ENCOUNTER — Telehealth: Payer: Self-pay | Admitting: Pharmacy Technician

## 2018-10-07 NOTE — Telephone Encounter (Signed)
Left message for patient to see if she is expecting any insurance changes for 2020. Patient is receiving infusions that may require a pre-certification.  3:03 PM Dorthula Nettles, CPhT

## 2018-10-13 ENCOUNTER — Other Ambulatory Visit: Payer: Self-pay | Admitting: *Deleted

## 2018-10-13 NOTE — Progress Notes (Signed)
Infusion orders are current for patient CBC CMP Tylenol Benadryl appointments are up to date and follow up appointment  is scheduled TB gold not due yet.  

## 2018-10-18 ENCOUNTER — Ambulatory Visit (HOSPITAL_COMMUNITY)
Admission: RE | Admit: 2018-10-18 | Discharge: 2018-10-18 | Disposition: A | Discharge status: Home / Self Care | Payer: Medicare Other | Source: Ambulatory Visit | Attending: Rheumatology | Admitting: Rheumatology

## 2018-10-18 DIAGNOSIS — D8686 Sarcoid arthropathy: Secondary | ICD-10-CM | POA: Diagnosis not present

## 2018-10-18 LAB — COMPREHENSIVE METABOLIC PANEL
ALT: 24 U/L (ref 0–44)
ANION GAP: 10 (ref 5–15)
AST: 26 U/L (ref 15–41)
Albumin: 4.1 g/dL (ref 3.5–5.0)
Alkaline Phosphatase: 49 U/L (ref 38–126)
BUN: 19 mg/dL (ref 8–23)
CHLORIDE: 107 mmol/L (ref 98–111)
CO2: 23 mmol/L (ref 22–32)
CREATININE: 0.81 mg/dL (ref 0.44–1.00)
Calcium: 8.9 mg/dL (ref 8.9–10.3)
GFR calc non Af Amer: >60 mL/min (ref >60)
Glucose, Bld: 132 mg/dL — ABNORMAL HIGH (ref 70–99)
Potassium: 3.7 mmol/L (ref 3.5–5.1)
SODIUM: 140 mmol/L (ref 135–145)
Total Bilirubin: 0.8 mg/dL (ref 0.3–1.2)
Total Protein: 6.5 g/dL (ref 6.5–8.1)

## 2018-10-18 LAB — CBC
HCT: 39 % (ref 36.0–46.0)
Hemoglobin: 13 g/dL (ref 12.0–15.0)
MCH: 30.1 pg (ref 26.0–34.0)
MCHC: 33.3 g/dL (ref 30.0–36.0)
MCV: 90.3 fL (ref 80.0–100.0)
NRBC: 0 % (ref 0.0–0.2)
PLATELETS: 285 10*3/uL (ref 150–400)
RBC: 4.32 MIL/uL (ref 3.87–5.11)
RDW: 13.4 % (ref 11.5–15.5)
WBC: 6.4 10*3/uL (ref 4.0–10.5)

## 2018-10-18 MED ORDER — ACETAMINOPHEN 325 MG PO TABS
650.0000 mg | ORAL_TABLET | ORAL | Status: DC
  Administered 2018-10-18: 650 mg via ORAL

## 2018-10-18 MED ORDER — DIPHENHYDRAMINE HCL 25 MG PO CAPS
ORAL_CAPSULE | ORAL | Status: AC
  Filled 2018-10-18: qty 1

## 2018-10-18 MED ORDER — ACETAMINOPHEN 325 MG PO TABS
ORAL_TABLET | ORAL | Status: AC
  Filled 2018-10-18: qty 2

## 2018-10-18 MED ORDER — DIPHENHYDRAMINE HCL 25 MG PO CAPS
25.0000 mg | ORAL_CAPSULE | ORAL | Status: DC
  Administered 2018-10-18: 25 mg via ORAL

## 2018-10-18 MED ORDER — SODIUM CHLORIDE 0.9 % IV SOLN
3.0000 mg/kg | INTRAVENOUS | Status: DC
  Administered 2018-10-18: 200 mg via INTRAVENOUS
  Filled 2018-10-18: qty 20

## 2018-10-20 DIAGNOSIS — M25512 Pain in left shoulder: Secondary | ICD-10-CM | POA: Diagnosis not present

## 2018-10-26 DIAGNOSIS — M7502 Adhesive capsulitis of left shoulder: Secondary | ICD-10-CM | POA: Diagnosis not present

## 2018-10-26 DIAGNOSIS — M25512 Pain in left shoulder: Secondary | ICD-10-CM | POA: Diagnosis not present

## 2018-10-26 NOTE — Telephone Encounter (Signed)
Submitted benefits investigation through [place] [employer]:   Patient's insurance plans will not change for 2020. Medicare covers 80% of the infusion and no authorization is required, and the supplement would cover the 100% of the cost that was not paid for by Medicare as long as Medicare covered the medication, after pt pays her $198 deductible .

## 2018-11-02 DIAGNOSIS — M25512 Pain in left shoulder: Secondary | ICD-10-CM | POA: Diagnosis not present

## 2018-11-09 DIAGNOSIS — M25512 Pain in left shoulder: Secondary | ICD-10-CM | POA: Diagnosis not present

## 2018-11-11 NOTE — Progress Notes (Deleted)
Office Visit Note  Patient: Renee Jones             Date of Birth: 1951-01-26           MRN: [id number]             PCP: Regan Lemmingamnitz, Will Martin, MD Referring: Clayborn Heronankins, Victoria R, MD Visit Date: 11/24/2018 Occupation: @GUAROCC @  Subjective:  No chief complaint on file.   History of Present Illness: Renee Jones is a 68 y.o. female ***   Activities of Daily Living:  Patient reports morning stiffness for *** {minute/hour:19697}.   Patient {ACTIONS;DENIES/REPORTS:21021675::"Denies"} nocturnal pain.  Difficulty dressing/grooming: {ACTIONS;DENIES/REPORTS:21021675::"Denies"} Difficulty climbing stairs: {ACTIONS;DENIES/REPORTS:21021675::"Denies"} Difficulty getting out of chair: {ACTIONS;DENIES/REPORTS:21021675::"Denies"} Difficulty using hands for taps, buttons, cutlery, and/or writing: {ACTIONS;DENIES/REPORTS:21021675::"Denies"}  No Rheumatology ROS completed.   PMFS History:  Patient Active Problem List   Diagnosis Date Noted  . High risk medications (not anticoagulants) long-term use 01/13/2017  . GERD (gastroesophageal reflux disease) 09/14/2016  . Bronchial spasm 09/14/2016  . Glaucoma 09/14/2016  . Detached retina 09/14/2016  . Osteoarthritis of both hands 09/14/2016  . Osteoarthritis of both knees 09/14/2016  . DDD (degenerative disc disease), lumbar 09/14/2016  . Uveitis 06/22/2016  . SVT (supraventricular tachycardia) (HCC) 08/28/2015  . Essential hypertension 08/28/2015  . Diastolic dysfunction 08/28/2015  . Sarcoidosis 09/30/2012  . Pulmonary nodules 09/30/2012  . Tachycardia 09/30/2012  . Sarcoid arthropathy 09/30/2012    Past Medical History:  Diagnosis Date  . Allergy   . Asthma   . Bronchial spasm 09/14/2016  . DDD (degenerative disc disease), lumbar 09/14/2016  . Detached retina 09/14/2016  . Diastolic dysfunction 08/28/2015  . Essential hypertension 08/28/2015  . GERD (gastroesophageal reflux disease)   . Glaucoma 09/14/2016  .  Hypertension   . Lumbar disc disease   . Neuromuscular disorder (HCC)   . Osteoarthritis of both hands 09/14/2016  . Osteoarthritis of both knees 09/14/2016  . Sarcoidosis   . Sarcoidosis    with +lymph node biopsy  . SVT (supraventricular tachycardia) (HCC) 08/28/2015  . Tachycardia   . Uveitis     Family History  Adopted: Yes  Problem Relation Age of Onset  . Scleroderma Daughter    Past Surgical History:  Procedure Laterality Date  . CYSTECTOMY    . EYE SURGERY Bilateral 2018   cataract removal   . LUMBAR SPINE SURGERY    . ROTATOR CUFF REPAIR    . TOTAL ABDOMINAL HYSTERECTOMY     Social History   Social History Narrative  . Not on file   Immunization History  Administered Date(s) Administered  . DTaP 07/26/2010  . Influenza Split 07/19/2014  . Influenza,inj,Quad PF,6+ Mos 07/19/2016  . Pneumococcal Polysaccharide-23 09/19/2012  . Pneumococcal-Unspecified 07/26/2012, 01/18/2015  . Varicella 09/19/2012  . Zoster 07/26/2012     Objective: Vital Signs: There were no vitals taken for this visit.   Physical Exam   Musculoskeletal Exam: ***  CDAI Exam: CDAI Score: Not documented Patient Global Assessment: Not documented; Provider Global Assessment: Not documented Swollen: Not documented; Tender: Not documented Joint Exam   Not documented   There is currently no information documented on the homunculus. Go to the Rheumatology activity and complete the homunculus joint exam.  Investigation: No additional findings.  Imaging: No results found.  Recent Labs: Lab Results  Component Value Date   WBC 6.4 10/18/2018   HGB 13.0 10/18/2018   PLT 285 10/18/2018   NA 140 10/18/2018   K 3.7 10/18/2018   CL  107 10/18/2018   CO2 23 10/18/2018   GLUCOSE 132 (H) 10/18/2018   BUN 19 10/18/2018   CREATININE 0.81 10/18/2018   BILITOT 0.8 10/18/2018   ALKPHOS 49 10/18/2018   AST 26 10/18/2018   ALT 24 10/18/2018   PROT 6.5 10/18/2018   ALBUMIN 4.1 10/18/2018     CALCIUM 8.9 10/18/2018   GFRAA >60 10/18/2018   QFTBGOLD Negative 06/16/2017   QFTBGOLDPLUS Negative 07/27/2018    Speciality Comments: REMICADE 3 mg/kg x 6 weeks TB GOLD negative 06/16/2017  Procedures:  No procedures performed Allergies: Augmentin [amoxicillin-pot clavulanate]; Codeine; Erythromycin; Sulfa antibiotics; and Tramadol   Assessment / Plan:     Visit Diagnoses: No diagnosis found.   Orders: No orders of the defined types were placed in this encounter.  No orders of the defined types were placed in this encounter.   Face-to-face time spent with patient was *** minutes. Greater than 50% of time was spent in counseling and coordination of care.  Follow-Up Instructions: No follow-ups on file.   Gearldine Bienenstockaylor M Ekin Pilar, PA-C  Note - This record has been created using Dragon software.  Chart creation errors have been sought, but may not always  have been located. Such creation errors do not reflect on  the standard of medical care.

## 2018-11-16 DIAGNOSIS — M25512 Pain in left shoulder: Secondary | ICD-10-CM | POA: Diagnosis not present

## 2018-11-17 ENCOUNTER — Telehealth: Payer: Self-pay | Admitting: Rheumatology

## 2018-11-17 NOTE — Telephone Encounter (Signed)
TB Gold results faxed.

## 2018-11-17 NOTE — Telephone Encounter (Signed)
Patient called stating she needs a copy of her TB gold labwork results from 07/27/18  faxed to her at [phone].

## 2018-11-24 ENCOUNTER — Ambulatory Visit: Payer: PRIVATE HEALTH INSURANCE | Admitting: Physician Assistant

## 2018-11-24 NOTE — Progress Notes (Signed)
Office Visit Note  Patient: Renee Jones             Date of Birth: Mar 24, 1951           MRN: [id number]             PCP: Regan Lemming, MD Referring: Clayborn Heron, MD Visit Date: 12/01/2018 Occupation: @GUAROCC @  Subjective:  Pain in both hands.    History of Present Illness: Renee Jones is a 68 y.o. female with history of uveitis and sarcoidosis.  She states she continues to have pain and discomfort in her hands.  She denies any joint swelling.  She does not have much discomfort in her knees currently.  Lower back pain continues.  She states she has been getting Remicade infusions and she is taking methotrexate on regular basis.  She has not had no recurrence of uveitis.  She had left rotator cuff tear repair in September 2019.  She is still gradually recovering .  Activities of Daily Living:  Patient reports morning stiffness for 0 minute.   Patient Denies nocturnal pain.  Difficulty dressing/grooming: Reports Difficulty climbing stairs: Reports Difficulty getting out of chair: Denies Difficulty using hands for taps, buttons, cutlery, and/or writing: Denies  Review of Systems  Constitutional: Negative for fatigue, night sweats, weight gain and weight loss.  HENT: Positive for mouth dryness. Negative for mouth sores, trouble swallowing, trouble swallowing and nose dryness.   Eyes: Positive for dryness. Negative for pain, redness and visual disturbance.  Respiratory: Positive for shortness of breath. Negative for cough and difficulty breathing.        On exertion  Cardiovascular: Positive for palpitations. Negative for chest pain, hypertension, irregular heartbeat and swelling in legs/feet.  Gastrointestinal: Negative for blood in stool, constipation and diarrhea.  Endocrine: Negative for increased urination.  Genitourinary: Negative for vaginal dryness.  Musculoskeletal: Positive for arthralgias and joint pain. Negative for joint swelling,  myalgias, muscle weakness, morning stiffness, muscle tenderness and myalgias.  Skin: Negative for color change, rash, hair loss, skin tightness, ulcers and sensitivity to sunlight.  Allergic/Immunologic: Negative for susceptible to infections.  Neurological: Negative for dizziness, memory loss, night sweats and weakness.  Hematological: Negative for swollen glands.  Psychiatric/Behavioral: Negative for depressed mood and sleep disturbance. The patient is not nervous/anxious.     PMFS History:  Patient Active Problem List   Diagnosis Date Noted  . High risk medications (not anticoagulants) long-term use 01/13/2017  . GERD (gastroesophageal reflux disease) 09/14/2016  . Bronchial spasm 09/14/2016  . Glaucoma 09/14/2016  . Detached retina 09/14/2016  . Osteoarthritis of both hands 09/14/2016  . Osteoarthritis of both knees 09/14/2016  . DDD (degenerative disc disease), lumbar 09/14/2016  . Uveitis 06/22/2016  . SVT (supraventricular tachycardia) (HCC) 08/28/2015  . Essential hypertension 08/28/2015  . Diastolic dysfunction 08/28/2015  . Sarcoidosis 09/30/2012  . Pulmonary nodules 09/30/2012  . Tachycardia 09/30/2012  . Sarcoid arthropathy 09/30/2012    Past Medical History:  Diagnosis Date  . Allergy   . Asthma   . Bronchial spasm 09/14/2016  . DDD (degenerative disc disease), lumbar 09/14/2016  . Detached retina 09/14/2016  . Diastolic dysfunction 08/28/2015  . Essential hypertension 08/28/2015  . GERD (gastroesophageal reflux disease)   . Glaucoma 09/14/2016  . Hypertension   . Lumbar disc disease   . Neuromuscular disorder (HCC)   . Osteoarthritis of both hands 09/14/2016  . Osteoarthritis of both knees 09/14/2016  . Sarcoidosis   . Sarcoidosis  with +lymph node biopsy  . SVT (supraventricular tachycardia) (HCC) 08/28/2015  . Tachycardia   . Uveitis     Family History  Adopted: Yes  Problem Relation Age of Onset  . Scleroderma Daughter    Past Surgical History:    Procedure Laterality Date  . CYSTECTOMY    . EYE SURGERY Bilateral 2018   cataract removal   . LUMBAR SPINE SURGERY    . ROTATOR CUFF REPAIR    . TOTAL ABDOMINAL HYSTERECTOMY     Social History   Social History Narrative  . Not on file   Immunization History  Administered Date(s) Administered  . DTaP 07/26/2010  . Influenza Split 07/19/2014  . Influenza,inj,Quad PF,6+ Mos 07/19/2016  . Pneumococcal Polysaccharide-23 09/19/2012  . Pneumococcal-Unspecified 07/26/2012, 01/18/2015  . Varicella 09/19/2012  . Zoster 07/26/2012     Objective: Vital Signs: BP 140/80 (BP Location: Left Arm, Patient Position: Sitting, Cuff Size: Normal)   Pulse 60   Resp 13   Ht 5\' 3"  (1.6 m)   Wt 143 lb (64.9 kg)   BMI 25.33 kg/m    Physical Exam Vitals signs and nursing note reviewed.  Constitutional:      Appearance: She is well-developed.  HENT:     Head: Normocephalic and atraumatic.  Eyes:     Conjunctiva/sclera: Conjunctivae normal.  Neck:     Musculoskeletal: Normal range of motion.  Cardiovascular:     Rate and Rhythm: Normal rate and regular rhythm.     Heart sounds: Normal heart sounds.  Pulmonary:     Effort: Pulmonary effort is normal.     Breath sounds: Normal breath sounds.  Abdominal:     General: Bowel sounds are normal.     Palpations: Abdomen is soft.  Lymphadenopathy:     Cervical: No cervical adenopathy.  Skin:    General: Skin is warm and dry.     Capillary Refill: Capillary refill takes less than 2 seconds.  Neurological:     Mental Status: She is alert and oriented to person, place, and time.  Psychiatric:        Behavior: Behavior normal.      Musculoskeletal Exam: C-spine thoracic lumbar spine good range of motion.  Shoulder joints elbow joints wrist joint MCPs PIPs been good range of motion.  She has DIP PIP thickening in her hands consistent with osteoarthritis.  Hip joints and knee joints with good range of motion.  No warmth swelling or effusion  was noted on examination.  CDAI Exam: CDAI Score: Not documented Patient Global Assessment: Not documented; Provider Global Assessment: Not documented Swollen: Not documented; Tender: Not documented Joint Exam   Not documented   There is currently no information documented on the homunculus. Go to the Rheumatology activity and complete the homunculus joint exam.  Investigation: No additional findings.  Imaging: No results found.  Recent Labs: Lab Results  Component Value Date   WBC 6.3 11/29/2018   HGB 13.3 11/29/2018   PLT 281 11/29/2018   NA 143 11/29/2018   K 3.7 11/29/2018   CL 109 11/29/2018   CO2 26 11/29/2018   GLUCOSE 135 (H) 11/29/2018   BUN 13 11/29/2018   CREATININE 0.95 11/29/2018   BILITOT 0.8 11/29/2018   ALKPHOS 51 11/29/2018   AST 18 11/29/2018   ALT 15 11/29/2018   PROT 6.7 11/29/2018   ALBUMIN 3.9 11/29/2018   CALCIUM 8.8 (L) 11/29/2018   GFRAA >60 11/29/2018   QFTBGOLD Negative 06/16/2017   QFTBGOLDPLUS Negative 07/27/2018  Speciality Comments: REMICADE 3 mg/kg x 6 weeks TB GOLD negative 06/16/2017  Procedures:  No procedures performed Allergies: Augmentin [amoxicillin-pot clavulanate]; Codeine; Erythromycin; Sulfa antibiotics; and Tramadol   Assessment / Plan:     Visit Diagnoses: Uveitis-patient has had no recurrence of uveitis.  She had seen ophthalmologist last year and will have appointment to see her.  Sarcoidosis-she has chronic mild shortness of breath with exertion.  She seen Dr. Mykala Gearing couple of years ago and there was no involvement of lungs at the time.  The chest x-ray was unremarkable.  High risk medications (not anticoagulants) long-term use -  Remicade IV infusion (last infusion 11/29/2018), methotrexate 10 tablets weekly, folic acid 2 mg daily.  Last TB gold negative on 07/27/2018.  Most recent CBC/CMP within normal limits except borderline low calcium on 11/29/2018.  Pulmonary nodules-stable  Primary osteoarthritis of  both hands-joint protection muscle strengthening was discussed.  Primary osteoarthritis of both knees-she is currently not having much discomfort in her knees.  DDD (degenerative disc disease), lumbar-she continues to have some lower back pain.  Weight loss diet and exercise was discussed.  A handout on back exercises was given. Complete tear of left rotator cuff, unspecified whether traumatic  SVT (supraventricular tachycardia) (HCC)  Essential hypertension-her systolic blood pressure is elevated.  Have advised her to monitor blood pressure closely.  Gastroesophageal reflux disease without esophagitis   Orders: No orders of the defined types were placed in this encounter.  No orders of the defined types were placed in this encounter.   Follow-Up Instructions: Return in about 5 months (around 05/01/2019) for Uveitis, Sarcoidosis, OA.   Pollyann Savoy, MD  Note - This record has been created using Animal nutritionist.  Chart creation errors have been sought, but may not always  have been located. Such creation errors do not reflect on  the standard of medical care.

## 2018-11-25 ENCOUNTER — Other Ambulatory Visit: Payer: Self-pay | Admitting: *Deleted

## 2018-11-25 NOTE — Progress Notes (Signed)
Infusion orders are current for patient CBC CMP Tylenol Benadryl appointments are up to date and follow up appointment  is scheduled TB gold not due yet.  

## 2018-11-28 DIAGNOSIS — Z4789 Encounter for other orthopedic aftercare: Secondary | ICD-10-CM | POA: Diagnosis not present

## 2018-11-28 DIAGNOSIS — M7502 Adhesive capsulitis of left shoulder: Secondary | ICD-10-CM | POA: Diagnosis not present

## 2018-11-29 ENCOUNTER — Ambulatory Visit (HOSPITAL_COMMUNITY)
Admission: RE | Admit: 2018-11-29 | Discharge: 2018-11-29 | Disposition: A | Discharge status: Home / Self Care | Payer: Medicare Other | Source: Ambulatory Visit | Attending: Rheumatology | Admitting: Rheumatology

## 2018-11-29 DIAGNOSIS — D8686 Sarcoid arthropathy: Secondary | ICD-10-CM | POA: Diagnosis not present

## 2018-11-29 LAB — CBC
HEMATOCRIT: 39.5 % (ref 36.0–46.0)
Hemoglobin: 13.3 g/dL (ref 12.0–15.0)
MCH: 31.1 pg (ref 26.0–34.0)
MCHC: 33.7 g/dL (ref 30.0–36.0)
MCV: 92.5 fL (ref 80.0–100.0)
Platelets: 281 10*3/uL (ref 150–400)
RBC: 4.27 MIL/uL (ref 3.87–5.11)
RDW: 13.4 % (ref 11.5–15.5)
WBC: 6.3 10*3/uL (ref 4.0–10.5)
nRBC: 0 % (ref 0.0–0.2)

## 2018-11-29 LAB — COMPREHENSIVE METABOLIC PANEL
ALT: 15 U/L (ref 0–44)
AST: 18 U/L (ref 15–41)
Albumin: 3.9 g/dL (ref 3.5–5.0)
Alkaline Phosphatase: 51 U/L (ref 38–126)
Anion gap: 8 (ref 5–15)
BILIRUBIN TOTAL: 0.8 mg/dL (ref 0.3–1.2)
BUN: 13 mg/dL (ref 8–23)
CHLORIDE: 109 mmol/L (ref 98–111)
CO2: 26 mmol/L (ref 22–32)
Calcium: 8.8 mg/dL — ABNORMAL LOW (ref 8.9–10.3)
Creatinine, Ser: 0.95 mg/dL (ref 0.44–1.00)
GFR calc Af Amer: >60 mL/min (ref >60)
Glucose, Bld: 135 mg/dL — ABNORMAL HIGH (ref 70–99)
POTASSIUM: 3.7 mmol/L (ref 3.5–5.1)
Sodium: 143 mmol/L (ref 135–145)
Total Protein: 6.7 g/dL (ref 6.5–8.1)

## 2018-11-29 MED ORDER — DIPHENHYDRAMINE HCL 25 MG PO CAPS
ORAL_CAPSULE | ORAL | Status: AC
  Filled 2018-11-29: qty 1

## 2018-11-29 MED ORDER — DIPHENHYDRAMINE HCL 25 MG PO CAPS
25.0000 mg | ORAL_CAPSULE | ORAL | Status: DC
  Administered 2018-11-29: 25 mg via ORAL

## 2018-11-29 MED ORDER — SODIUM CHLORIDE 0.9 % IV SOLN
3.0000 mg/kg | INTRAVENOUS | Status: AC
  Administered 2018-11-29: 200 mg via INTRAVENOUS
  Filled 2018-11-29: qty 20

## 2018-11-29 MED ORDER — ACETAMINOPHEN 325 MG PO TABS
ORAL_TABLET | ORAL | Status: AC
  Filled 2018-11-29: qty 2

## 2018-11-29 MED ORDER — ACETAMINOPHEN 325 MG PO TABS
650.0000 mg | ORAL_TABLET | ORAL | Status: DC
  Administered 2018-11-29: 650 mg via ORAL

## 2018-12-01 ENCOUNTER — Ambulatory Visit (INDEPENDENT_AMBULATORY_CARE_PROVIDER_SITE_OTHER): Payer: Medicare Other | Admitting: Rheumatology

## 2018-12-01 ENCOUNTER — Encounter: Payer: Self-pay | Admitting: Physician Assistant

## 2018-12-01 VITALS — BP 140/80 | HR 60 | Resp 13 | Ht 63.0 in | Wt 143.0 lb

## 2018-12-01 DIAGNOSIS — I10 Essential (primary) hypertension: Secondary | ICD-10-CM | POA: Diagnosis not present

## 2018-12-01 DIAGNOSIS — M19042 Primary osteoarthritis, left hand: Secondary | ICD-10-CM | POA: Diagnosis not present

## 2018-12-01 DIAGNOSIS — H209 Unspecified iridocyclitis: Secondary | ICD-10-CM

## 2018-12-01 DIAGNOSIS — M5136 Other intervertebral disc degeneration, lumbar region: Secondary | ICD-10-CM | POA: Diagnosis not present

## 2018-12-01 DIAGNOSIS — M75122 Complete rotator cuff tear or rupture of left shoulder, not specified as traumatic: Secondary | ICD-10-CM

## 2018-12-01 DIAGNOSIS — K219 Gastro-esophageal reflux disease without esophagitis: Secondary | ICD-10-CM

## 2018-12-01 DIAGNOSIS — R918 Other nonspecific abnormal finding of lung field: Secondary | ICD-10-CM

## 2018-12-01 DIAGNOSIS — Z79899 Other long term (current) drug therapy: Secondary | ICD-10-CM

## 2018-12-01 DIAGNOSIS — M19041 Primary osteoarthritis, right hand: Secondary | ICD-10-CM

## 2018-12-01 DIAGNOSIS — I471 Supraventricular tachycardia: Secondary | ICD-10-CM

## 2018-12-01 DIAGNOSIS — D869 Sarcoidosis, unspecified: Secondary | ICD-10-CM | POA: Diagnosis not present

## 2018-12-01 DIAGNOSIS — M170 Bilateral primary osteoarthritis of knee: Secondary | ICD-10-CM | POA: Diagnosis not present

## 2018-12-01 NOTE — Patient Instructions (Signed)

## 2018-12-18 ENCOUNTER — Other Ambulatory Visit: Payer: Self-pay | Admitting: Cardiovascular Disease

## 2018-12-19 DIAGNOSIS — H40013 Open angle with borderline findings, low risk, bilateral: Secondary | ICD-10-CM | POA: Diagnosis not present

## 2018-12-21 ENCOUNTER — Ambulatory Visit (INDEPENDENT_AMBULATORY_CARE_PROVIDER_SITE_OTHER): Payer: Medicare Other | Admitting: Primary Care

## 2018-12-21 ENCOUNTER — Encounter: Payer: Self-pay | Admitting: Primary Care

## 2018-12-21 ENCOUNTER — Other Ambulatory Visit: Payer: Self-pay | Admitting: Primary Care

## 2018-12-21 VITALS — BP 140/82 | HR 58 | Temp 98.0°F | Ht 63.0 in | Wt 142.5 lb

## 2018-12-21 DIAGNOSIS — M19041 Primary osteoarthritis, right hand: Secondary | ICD-10-CM

## 2018-12-21 DIAGNOSIS — D869 Sarcoidosis, unspecified: Secondary | ICD-10-CM | POA: Diagnosis not present

## 2018-12-21 DIAGNOSIS — M5136 Other intervertebral disc degeneration, lumbar region: Secondary | ICD-10-CM

## 2018-12-21 DIAGNOSIS — E1165 Type 2 diabetes mellitus with hyperglycemia: Secondary | ICD-10-CM | POA: Insufficient documentation

## 2018-12-21 DIAGNOSIS — R739 Hyperglycemia, unspecified: Secondary | ICD-10-CM

## 2018-12-21 DIAGNOSIS — I471 Supraventricular tachycardia, unspecified: Secondary | ICD-10-CM

## 2018-12-21 DIAGNOSIS — I10 Essential (primary) hypertension: Secondary | ICD-10-CM | POA: Diagnosis not present

## 2018-12-21 DIAGNOSIS — H209 Unspecified iridocyclitis: Secondary | ICD-10-CM

## 2018-12-21 DIAGNOSIS — R7303 Prediabetes: Secondary | ICD-10-CM

## 2018-12-21 DIAGNOSIS — Z23 Encounter for immunization: Secondary | ICD-10-CM

## 2018-12-21 DIAGNOSIS — M19042 Primary osteoarthritis, left hand: Secondary | ICD-10-CM

## 2018-12-21 DIAGNOSIS — K219 Gastro-esophageal reflux disease without esophagitis: Secondary | ICD-10-CM

## 2018-12-21 DIAGNOSIS — R918 Other nonspecific abnormal finding of lung field: Secondary | ICD-10-CM | POA: Diagnosis not present

## 2018-12-21 DIAGNOSIS — M51369 Other intervertebral disc degeneration, lumbar region without mention of lumbar back pain or lower extremity pain: Secondary | ICD-10-CM

## 2018-12-21 LAB — HEMOGLOBIN A1C: Hgb A1c MFr Bld: 6.1 % (ref 4.6–6.5)

## 2018-12-21 LAB — LIPID PANEL
CHOL/HDL RATIO: 4
Cholesterol: 190 mg/dL (ref 0–200)
HDL: 47.9 mg/dL (ref >39.00)
LDL Cholesterol: 108 mg/dL — ABNORMAL HIGH (ref 0–99)
NonHDL: 142.17
Triglycerides: 171 mg/dL — ABNORMAL HIGH (ref 0.0–149.0)
VLDL: 34.2 mg/dL (ref 0.0–40.0)

## 2018-12-21 MED ORDER — PANTOPRAZOLE SODIUM 40 MG PO TBEC: 40.0000 mg | DELAYED_RELEASE_TABLET | Freq: Every day | ORAL | 3 refills | Status: DC

## 2018-12-21 NOTE — Assessment & Plan Note (Addendum)
Following with rheumatology, doing well on Remicade and methotrexate. No pulmonary involvement per visit notes from rheumatology in February 2020. Continue current regimen.  Exam today unremarkable.

## 2018-12-21 NOTE — Assessment & Plan Note (Signed)
Borderline but stable in the office today. Continue current regimen and cardiology evaluation.

## 2018-12-21 NOTE — Assessment & Plan Note (Signed)
A1c today of 6.1. Discussed today to continue to work on diet, regular exercise. Repeat A1c in 6 months.

## 2018-12-21 NOTE — Assessment & Plan Note (Signed)
Following with electrophysiology. Continue metoprolol and blood pressure control.

## 2018-12-21 NOTE — Assessment & Plan Note (Signed)
Chronic.  Following with rheumatology/orthopedics.

## 2018-12-21 NOTE — Patient Instructions (Addendum)
Stop by the lab prior to leaving today. I will notify you of your results once received.   I sent refills for pantoprazole 40 mg to your pharmacy.  It was a pleasure to meet you today! Please don't hesitate to call or message me with any questions. Welcome to [facility]!

## 2018-12-21 NOTE — Assessment & Plan Note (Signed)
No recent flares in 2 years per patient. Doing well on Remicade and methotrexate.

## 2018-12-21 NOTE — Assessment & Plan Note (Signed)
No further imaging required as of July 2014 based off of repeat CT chest. Nodules determined to be benign. No smoking history.

## 2018-12-21 NOTE — Assessment & Plan Note (Signed)
Chronic Following with rheumatology 

## 2018-12-21 NOTE — Progress Notes (Signed)
Subjective:    Patient ID: Renee Jones, female    DOB: [birth year], 68 y.o.   MRN: [id number]  HPI  Renee Jones is a 68 year old female who presents today to establish care and discuss the problems mentioned below. Will obtain old records.  1) SVT/Hypertension: Currently managed on HCTZ 12.5 mg, metoprolol tartrate 75 mg BID. She is currently following with Dr. Elberta Fortis every 6 months on average.  She denies chest pain, palpitations, shortness of breath.  BP Readings from Last 3 Encounters:  12/21/18 140/82  12/01/18 140/80  11/29/18 (!) 143/77     2) Sarcoidosis/Osteoarthritis: Diagnosed in 2011 and is managed on Remicaid and Methotrexate. Currently following with Dr. Corliss Skains (rheumatology). Also with a history of uveitis, no problems in over two years. She is managed on albuterol inhaler for which she uses infrequently. She will use ondansetron once weekly when taking both Remicade and methotrexate at once.   3) GERD: Currently managed on pantoprazole 40 mg once daily. She underwent endoscopy in 2016 with confirmation. No Barrett's esophagus.  She is compliant to pantoprazole and denies breakthrough GERD symptoms.  4) Hyperglycemia: Noted on fasting labs via records for the last one year. Last BMP fasting glucose of 135 two weeks ago. She has cut back on sugars and carbohydrates over the last two weeks. She is adopted so she is unsure of her family history.  She denies polyuria, polydipsia, polyphagia.  Review of Systems  Respiratory: Negative for shortness of breath.   Cardiovascular: Negative for chest pain and palpitations.  Gastrointestinal:       Denies recent GERD symptoms  Endocrine: Negative for polydipsia, polyphagia and polyuria.  Musculoskeletal: Positive for arthralgias.       Past Medical History:  Diagnosis Date  . Allergy   . Asthma   . Bronchial spasm 09/14/2016  . DDD (degenerative disc disease), lumbar 09/14/2016  . Detached retina 09/14/2016  .  Diastolic dysfunction 08/28/2015  . Essential hypertension 08/28/2015  . GERD (gastroesophageal reflux disease)   . Glaucoma 09/14/2016  . Hypertension   . Lumbar disc disease   . Neuromuscular disorder (HCC)   . Osteoarthritis of both hands 09/14/2016  . Osteoarthritis of both knees 09/14/2016  . Sarcoidosis   . Sarcoidosis    with +lymph node biopsy  . SVT (supraventricular tachycardia) (HCC) 08/28/2015  . Tachycardia   . Uveitis      Social History   Socioeconomic History  . Marital status: Married    Spouse name: Not on file  . Number of children: Not on file  . Years of education: Not on file  . Highest education level: Not on file  Occupational History  . Not on file  Social Needs  . Financial resource strain: Not on file  . Food insecurity:    Worry: Not on file    Inability: Not on file  . Transportation needs:    Medical: Not on file    Non-medical: Not on file  Tobacco Use  . Smoking status: Never Smoker  . Smokeless tobacco: Never Used  Substance and Sexual Activity  . Alcohol use: No    Alcohol/week: 0.0 standard drinks  . Drug use: No  . Sexual activity: Not on file  Lifestyle  . Physical activity:    Days per week: Not on file    Minutes per session: Not on file  . Stress: Not on file  Relationships  . Social connections:    Talks on phone: Not on  file    Gets together: Not on file    Attends religious service: Not on file    Active member of club or organization: Not on file    Attends meetings of clubs or organizations: Not on file    Relationship status: Not on file  . Intimate partner violence:    Fear of current or ex partner: Not on file    Emotionally abused: Not on file    Physically abused: Not on file    Forced sexual activity: Not on file  Other Topics Concern  . Not on file  Social History Narrative  . Not on file    Past Surgical History:  Procedure Laterality Date  . CYSTECTOMY    . EYE SURGERY Bilateral 2018   cataract  removal   . LUMBAR SPINE SURGERY    . ROTATOR CUFF REPAIR    . TOTAL ABDOMINAL HYSTERECTOMY      Family History  Adopted: Yes  Problem Relation Age of Onset  . Scleroderma Daughter     Allergies  Allergen Reactions  . Augmentin [Amoxicillin-Pot Clavulanate]   . Codeine Itching  . Erythromycin Itching  . Sulfa Antibiotics Itching     vomiting  . Tramadol Itching    Current Outpatient Medications on File Prior to Visit  Medication Sig Dispense Refill  . albuterol (PROVENTIL HFA;VENTOLIN HFA) 108 (90 BASE) MCG/ACT inhaler Inhale 2 puffs into the lungs every 6 (six) hours as needed for wheezing or shortness of breath.    . cholecalciferol (VITAMIN D) 1000 units tablet Take 1,000 Units by mouth daily.    . diclofenac sodium (VOLTAREN) 1 % GEL Apply three grams to three large joints up to three times daily. 3 Tube 3  . fluticasone (FLONASE) 50 MCG/ACT nasal spray Place 2 sprays into both nostrils daily as needed for allergies or rhinitis.    . folic acid (FOLVITE) 1 MG tablet Take 2 tablets (2 mg total) by mouth daily. 180 tablet 4  . hydrochlorothiazide (MICROZIDE) 12.5 MG capsule TAKE 1 CAPSULE(12.5 MG) BY MOUTH DAILY 90 capsule 0  . InFLIXimab (REMICADE IV) Inject into the vein every 6 (six) weeks.    . methotrexate (RHEUMATREX) 2.5 MG tablet TAKE 10 TABLETS BY MOUTH ONCE A WEEK 120 tablet 0  . metoprolol tartrate (LOPRESSOR) 50 MG tablet TAKE 1 AND 1/2 TABLETS BY MOUTH TWICE DAILY 270 tablet 0  . Multiple Vitamin (MULTIVITAMIN WITH MINERALS) TABS tablet Take 1 tablet by mouth daily.    . ondansetron (ZOFRAN) 4 MG tablet Take 4 mg by mouth as needed for nausea or vomiting.     No current facility-administered medications on file prior to visit.     BP 140/82   Pulse (!) 58   Temp 98 F (36.7 C) (Oral)   Ht 5\' 3"  (1.6 m)   Wt 142 lb 8 oz (64.6 kg)   SpO2 99%   BMI 25.24 kg/m    Objective:   Physical Exam  Constitutional: She appears well-nourished.  Neck: Neck supple.   Cardiovascular: Normal rate and regular rhythm.  Respiratory: Effort normal and breath sounds normal.  Skin: Skin is warm and dry.  Psychiatric: She has a normal mood and affect.           Assessment & Plan:

## 2018-12-21 NOTE — Assessment & Plan Note (Signed)
Doing well on pantoprazole to 40 mg.  Continue same.  Refill sent to pharmacy.

## 2018-12-23 ENCOUNTER — Encounter: Payer: Self-pay | Admitting: *Deleted

## 2018-12-23 ENCOUNTER — Other Ambulatory Visit: Payer: Self-pay | Admitting: *Deleted

## 2018-12-23 NOTE — Progress Notes (Signed)
Infusion orders are current for patient CBC CMP Tylenol Benadryl appointments are up to date and follow up appointment  is scheduled TB gold not due yet.  

## 2018-12-28 ENCOUNTER — Telehealth: Payer: Self-pay | Admitting: Rheumatology

## 2018-12-28 NOTE — Telephone Encounter (Signed)
Patient left a message requesting a call back to discuss patients medications, and the Coronavirus. Patient would like to know if she needs to take extra precautions, wear a mask when going out, etc. Please call patient to discuss.

## 2018-12-28 NOTE — Telephone Encounter (Signed)
It is best to limit your contact with those who are sick and to practice good hand hygiene with soap and warm water or hand sanitizer when soap and water are not available. The infection is spread through respiratory droplets. It is important for persons who are sick to wear mask to prevent the spread of infections through respiratory droplets. Wearing a mask will not prevent you from getting the infection and is not necessary. Patient verbalized understanding.

## 2019-01-05 ENCOUNTER — Other Ambulatory Visit: Payer: Self-pay | Admitting: Rheumatology

## 2019-01-06 ENCOUNTER — Other Ambulatory Visit: Payer: Self-pay

## 2019-01-06 NOTE — Telephone Encounter (Signed)
Last Visit: 12/01/18 Next Visit: 05/02/19 Labs: 11/29/18 Glucose is 135. Calcium is borderline low. Rest of CBC and CMP WNL  Okay to refill per Dr. Corliss Skains

## 2019-01-09 ENCOUNTER — Other Ambulatory Visit: Payer: Self-pay

## 2019-01-09 MED ORDER — HYDROCHLOROTHIAZIDE 12.5 MG PO CAPS: ORAL_CAPSULE | ORAL | 3 refills | Status: DC

## 2019-01-10 ENCOUNTER — Ambulatory Visit (HOSPITAL_COMMUNITY)
Admission: RE | Admit: 2019-01-10 | Discharge: 2019-01-10 | Disposition: A | Discharge status: Home / Self Care | Payer: Medicare Other | Source: Ambulatory Visit | Attending: Rheumatology | Admitting: Rheumatology

## 2019-01-10 DIAGNOSIS — D8686 Sarcoid arthropathy: Secondary | ICD-10-CM | POA: Diagnosis not present

## 2019-01-10 MED ORDER — ACETAMINOPHEN 325 MG PO TABS
650.0000 mg | ORAL_TABLET | ORAL | Status: DC
  Administered 2019-01-10: 650 mg via ORAL

## 2019-01-10 MED ORDER — DIPHENHYDRAMINE HCL 25 MG PO CAPS
25.0000 mg | ORAL_CAPSULE | ORAL | Status: DC
  Administered 2019-01-10: 25 mg via ORAL

## 2019-01-10 MED ORDER — DIPHENHYDRAMINE HCL 25 MG PO CAPS
ORAL_CAPSULE | ORAL | Status: AC
  Administered 2019-01-10: 25 mg via ORAL
  Filled 2019-01-10: qty 1

## 2019-01-10 MED ORDER — ACETAMINOPHEN 325 MG PO TABS
ORAL_TABLET | ORAL | Status: AC
  Administered 2019-01-10: 650 mg via ORAL
  Filled 2019-01-10: qty 2

## 2019-01-10 MED ORDER — SODIUM CHLORIDE 0.9 % IV SOLN
3.0000 mg/kg | INTRAVENOUS | Status: DC
  Administered 2019-01-10: 200 mg via INTRAVENOUS
  Filled 2019-01-10: qty 20

## 2019-01-11 ENCOUNTER — Ambulatory Visit: Payer: Medicare Other | Admitting: Internal Medicine

## 2019-01-19 ENCOUNTER — Telehealth: Payer: Self-pay | Admitting: Rheumatology

## 2019-01-19 NOTE — Telephone Encounter (Signed)
Patient called requesting prescription refill of Zolfran to be sent to [facility] at [address]  Patient states she just ran out of medication and the last time it was refilled was in 2017.

## 2019-01-19 NOTE — Telephone Encounter (Signed)
Ok to refill zofran 

## 2019-01-19 NOTE — Telephone Encounter (Signed)
Last Visit: 12/01/18 Next Visit: 05/02/19  We last prescribed in 2017  Okay to refill Zofran?

## 2019-01-20 MED ORDER — ONDANSETRON HCL 4 MG PO TABS: 4.0000 mg | ORAL_TABLET | Freq: Four times a day (QID) | ORAL | 0 refills | Status: DC | PRN

## 2019-02-03 ENCOUNTER — Telehealth: Payer: Self-pay | Admitting: Rheumatology

## 2019-02-03 MED ORDER — DICLOFENAC SODIUM 1 % TD GEL: TRANSDERMAL | 3 refills | Status: DC

## 2019-02-03 NOTE — Telephone Encounter (Signed)
Last Visit: 12/01/18 Next Visit: 05/02/19  Okay to refill per Dr. Corliss Skains

## 2019-02-03 NOTE — Telephone Encounter (Signed)
Patient request refill for Voltaren Gel sent to [facility] on [address]. In [place].

## 2019-02-17 ENCOUNTER — Other Ambulatory Visit: Payer: Self-pay | Admitting: *Deleted

## 2019-02-17 NOTE — Progress Notes (Signed)
Infusion orders are current for patient CBC CMP Tylenol Benadryl appointments are up to date and follow up appointment  is scheduled TB gold not due yet.  

## 2019-02-21 ENCOUNTER — Ambulatory Visit (HOSPITAL_COMMUNITY)
Admission: RE | Admit: 2019-02-21 | Discharge: 2019-02-21 | Disposition: A | Discharge status: Home / Self Care | Payer: Medicare Other | Source: Ambulatory Visit | Attending: Rheumatology | Admitting: Rheumatology

## 2019-02-21 ENCOUNTER — Other Ambulatory Visit: Payer: Self-pay

## 2019-02-21 DIAGNOSIS — D8686 Sarcoid arthropathy: Secondary | ICD-10-CM | POA: Insufficient documentation

## 2019-02-21 LAB — CBC
HCT: 43.9 % (ref 36.0–46.0)
Hemoglobin: 14.7 g/dL (ref 12.0–15.0)
MCH: 30.8 pg (ref 26.0–34.0)
MCHC: 33.5 g/dL (ref 30.0–36.0)
MCV: 92 fL (ref 80.0–100.0)
Platelets: 332 10*3/uL (ref 150–400)
RBC: 4.77 MIL/uL (ref 3.87–5.11)
RDW: 13.5 % (ref 11.5–15.5)
WBC: 6.8 10*3/uL (ref 4.0–10.5)
nRBC: 0 % (ref 0.0–0.2)

## 2019-02-21 LAB — COMPREHENSIVE METABOLIC PANEL
ALT: 29 U/L (ref 0–44)
AST: 39 U/L (ref 15–41)
Albumin: 4.3 g/dL (ref 3.5–5.0)
Alkaline Phosphatase: 55 U/L (ref 38–126)
Anion gap: 8 (ref 5–15)
BUN: 14 mg/dL (ref 8–23)
CO2: 27 mmol/L (ref 22–32)
Calcium: 9.2 mg/dL (ref 8.9–10.3)
Chloride: 105 mmol/L (ref 98–111)
Creatinine, Ser: 0.99 mg/dL (ref 0.44–1.00)
GFR calc Af Amer: >60 mL/min (ref >60)
GFR calc non Af Amer: 59 mL/min — ABNORMAL LOW (ref >60)
Glucose, Bld: 126 mg/dL — ABNORMAL HIGH (ref 70–99)
Potassium: 4.1 mmol/L (ref 3.5–5.1)
Sodium: 140 mmol/L (ref 135–145)
Total Bilirubin: 1.1 mg/dL (ref 0.3–1.2)
Total Protein: 7.1 g/dL (ref 6.5–8.1)

## 2019-02-21 MED ORDER — ACETAMINOPHEN 325 MG PO TABS
650.0000 mg | ORAL_TABLET | ORAL | Status: AC
  Administered 2019-02-21: 650 mg via ORAL

## 2019-02-21 MED ORDER — DIPHENHYDRAMINE HCL 25 MG PO CAPS
25.0000 mg | ORAL_CAPSULE | ORAL | Status: AC
  Administered 2019-02-21: 25 mg via ORAL

## 2019-02-21 MED ORDER — DIPHENHYDRAMINE HCL 25 MG PO CAPS
ORAL_CAPSULE | ORAL | Status: AC
  Filled 2019-02-21: qty 1

## 2019-02-21 MED ORDER — ACETAMINOPHEN 325 MG PO TABS
ORAL_TABLET | ORAL | Status: AC
  Filled 2019-02-21: qty 2

## 2019-02-21 MED ORDER — SODIUM CHLORIDE 0.9 % IV SOLN
3.0000 mg/kg | INTRAVENOUS | Status: AC
  Administered 2019-02-21: 200 mg via INTRAVENOUS
  Filled 2019-02-21: qty 20

## 2019-03-01 ENCOUNTER — Telehealth: Payer: Self-pay | Admitting: Rheumatology

## 2019-03-01 NOTE — Telephone Encounter (Signed)
We will examine her at the fu visit.

## 2019-03-01 NOTE — Telephone Encounter (Signed)
Attempted to contact the patient and left message for patient to call the office.  

## 2019-03-01 NOTE — Telephone Encounter (Signed)
Patient states she has developed indentation in her fingernail on the 3rd finger on right hand. Patient states she has several other fingernails that have ridges that have started. Patient states this has been going on for about 2-3 weeks. Patient states she has not noticed any other new symptoms. Patient states she is currently on 10 tablets of MTX weekly and Remicade infusions. Patient states she had her last infusion about 2 weeks ago. Patient would like to know if she should be concerned about the indentation in her fingernails. Please advise.

## 2019-03-01 NOTE — Telephone Encounter (Signed)
Patient called stating approximately 2 weeks ago she began to notice her fingernails had indentations in both hands middle fingers, thumbs, and ring finger and pinky on the right hand.  Patient states she is currently taking Methotrexate and Remicade and is worried this could be the cause.  Patient requested a return call.

## 2019-03-15 NOTE — Telephone Encounter (Signed)
Patient advised Dr. Corliss Skains will examine at her follow up visit. Patient advised to contact the office if she has any additional symptoms arise.

## 2019-03-27 ENCOUNTER — Other Ambulatory Visit: Payer: Self-pay | Admitting: *Deleted

## 2019-03-27 DIAGNOSIS — D869 Sarcoidosis, unspecified: Secondary | ICD-10-CM

## 2019-03-27 NOTE — Addendum Note (Signed)
Addended by: Carole Binning on: 03/27/2019 01:57 PM   Modules accepted: Orders

## 2019-04-04 ENCOUNTER — Other Ambulatory Visit: Payer: Self-pay

## 2019-04-04 ENCOUNTER — Ambulatory Visit (HOSPITAL_COMMUNITY)
Admission: RE | Admit: 2019-04-04 | Discharge: 2019-04-04 | Disposition: A | Discharge status: Home / Self Care | Payer: Medicare Other | Source: Ambulatory Visit | Attending: Rheumatology | Admitting: Rheumatology

## 2019-04-04 DIAGNOSIS — D869 Sarcoidosis, unspecified: Secondary | ICD-10-CM | POA: Diagnosis not present

## 2019-04-04 MED ORDER — SODIUM CHLORIDE 0.9 % IV SOLN
3.0000 mg/kg | INTRAVENOUS | Status: DC
  Administered 2019-04-04: 200 mg via INTRAVENOUS
  Filled 2019-04-04: qty 20

## 2019-04-04 MED ORDER — DIPHENHYDRAMINE HCL 25 MG PO CAPS
25.0000 mg | ORAL_CAPSULE | ORAL | Status: DC
  Administered 2019-04-04: 25 mg via ORAL

## 2019-04-04 MED ORDER — DIPHENHYDRAMINE HCL 25 MG PO CAPS
ORAL_CAPSULE | ORAL | Status: AC
  Filled 2019-04-04: qty 1

## 2019-04-04 MED ORDER — ACETAMINOPHEN 325 MG PO TABS
ORAL_TABLET | ORAL | Status: AC
  Filled 2019-04-04: qty 2

## 2019-04-04 MED ORDER — ACETAMINOPHEN 325 MG PO TABS
650.0000 mg | ORAL_TABLET | ORAL | Status: DC
  Administered 2019-04-04: 650 mg via ORAL

## 2019-04-13 ENCOUNTER — Other Ambulatory Visit: Payer: Self-pay | Admitting: Rheumatology

## 2019-04-13 NOTE — Telephone Encounter (Signed)
Last Visit: 12/01/18 Next Visit: 05/02/19 Labs:02/21/19 Glucose is elevated-126. Rest of lab work is WNL.  Okay to refill per Dr. Estanislado Pandy

## 2019-04-19 NOTE — Progress Notes (Signed)
Office Visit Note  Patient: Renee Jones             Date of Birth: January 30, 1951           MRN: [id number]             PCP: Pleas Koch, NP Referring: Constance Haw, MD Visit Date: 05/02/2019 Occupation: @GUAROCC @  Subjective:  Pain in both hands   History of Present Illness: Aarianna Hoadley is a 68 y.o. female with history of uveitis, sarcoidosis, and osteoarthritis.  Patient is on Remicade IV infusions 3 mg/kg every 6 weeks, methotrexate 10 tablets by mouth once weekly, folic acid 2 mg a mouth daily. She is due for her next infusion next Tuesday.  She presents today with increased pain in bilateral hands.  She describes the pain as an aching sensation.  She has intermittent joint swelling in both hands.  She states that the pain is posterior in the right hand.  She has tried wearing copper gloves as well as battery operated hand gloves.  She applies Voltaren gel topically as needed for pain relief.  She denies any signs or symptoms of a sarcoidosis flare.  She has no coughing or new pulmonary symptoms.  She has not had to see the pulmonologist recently.  She continues to follow closely with her ophthalmologist and has not had any uveitis flares.  She states that she occasionally has right eye pain use about once or twice a month.  She describes it as aching sensation.  She denies any redness but continues to have eye dryness bilaterally.  According to the patient her ophthalmologist recommended continuing on the current treatment regimen.  Activities of Daily Living:  Patient reports morning stiffness for 0 minutes.   Patient Reports nocturnal pain.  Difficulty dressing/grooming: Denies Difficulty climbing stairs: Denies Difficulty getting out of chair: Denies Difficulty using hands for taps, buttons, cutlery, and/or writing: Denies  Review of Systems  Constitutional: Negative for fatigue.  HENT: Negative for mouth sores, mouth dryness and nose dryness.     Eyes: Positive for dryness. Negative for pain and visual disturbance.  Respiratory: Negative for cough, hemoptysis, shortness of breath and difficulty breathing.   Cardiovascular: Negative for chest pain, palpitations, hypertension and swelling in legs/feet.  Gastrointestinal: Negative for blood in stool, constipation and diarrhea.  Endocrine: Negative for increased urination.  Genitourinary: Negative for painful urination.  Musculoskeletal: Positive for arthralgias, joint pain and joint swelling. Negative for myalgias, muscle weakness, morning stiffness, muscle tenderness and myalgias.  Skin: Negative for color change, pallor, rash, hair loss, nodules/bumps, skin tightness, ulcers and sensitivity to sunlight.  Allergic/Immunologic: Negative for susceptible to infections.  Neurological: Negative for dizziness, numbness, headaches and weakness.  Hematological: Negative for swollen glands.  Psychiatric/Behavioral: Positive for sleep disturbance. Negative for depressed mood. The patient is not nervous/anxious.     PMFS History:  Patient Active Problem List   Diagnosis Date Noted   Prediabetes 12/21/2018   High risk medications (not anticoagulants) long-term use 01/13/2017   GERD (gastroesophageal reflux disease) 09/14/2016   Bronchial spasm 09/14/2016   Glaucoma 09/14/2016   Detached retina 09/14/2016   Osteoarthritis of both hands 09/14/2016   Osteoarthritis of both knees 09/14/2016   DDD (degenerative disc disease), lumbar 09/14/2016   Uveitis 06/22/2016   SVT (supraventricular tachycardia) ([place]) 08/28/2015   Essential hypertension 43/32/9518   Diastolic dysfunction 84/16/6063   Sarcoidosis 09/30/2012   Pulmonary nodules 09/30/2012   Tachycardia 09/30/2012   Sarcoid arthropathy 09/30/2012  Past Medical History:  Diagnosis Date   Allergy    Asthma    Bronchial spasm 09/14/2016   DDD (degenerative disc disease), lumbar 09/14/2016   Detached retina  09/14/2016   Diastolic dysfunction 08/28/2015   Essential hypertension 08/28/2015   GERD (gastroesophageal reflux disease)    Glaucoma 09/14/2016   Neuromuscular disorder (HCC)    Osteoarthritis of both hands 09/14/2016   Osteoarthritis of both knees 09/14/2016   Sarcoidosis    with +lymph node biopsy   SVT (supraventricular tachycardia) (HCC) 08/28/2015   Tachycardia    Uveitis     Family History  Adopted: Yes  Problem Relation Age of Onset   Scleroderma Daughter    Past Surgical History:  Procedure Laterality Date   CATARACT EXTRACTION, BILATERAL  2018   CYSTECTOMY     EYE SURGERY Bilateral 2018   cataract removal    LUMBAR SPINE SURGERY  2009   ROTATOR CUFF REPAIR Right 2011   ROTATOR CUFF REPAIR Left 06/2018   TONSILLECTOMY AND ADENOIDECTOMY  1962   TOTAL ABDOMINAL HYSTERECTOMY  1984   Social History   Social History Narrative   Widower.   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   Immunization History  Administered Date(s) Administered   DTaP 07/26/2010   Influenza Split 07/19/2014   Influenza,inj,Quad PF,6+ Mos 07/19/2016, 12/21/2018   Pneumococcal Polysaccharide-23 09/19/2012   Pneumococcal-Unspecified 07/26/2012, 01/18/2015   Td 03/31/2010   Varicella 09/19/2012   Zoster 07/26/2012     Objective: Vital Signs: BP 120/74 (BP Location: Left Arm, Patient Position: Sitting, Cuff Size: Normal)    Pulse 66    Resp 12    Ht 5\' 3"  (1.6 m)    Wt 144 lb 6.4 oz (65.5 kg)    BMI 25.58 kg/m    Physical Exam Vitals signs and nursing note reviewed.  Constitutional:      Appearance: She is well-developed.  HENT:     Head: Normocephalic and atraumatic.  Eyes:     Conjunctiva/sclera: Conjunctivae normal.  Neck:     Musculoskeletal: Normal range of motion.  Cardiovascular:     Rate and Rhythm: Normal rate and regular rhythm.     Heart sounds: Normal heart sounds.  Pulmonary:     Effort: Pulmonary effort is normal.      Breath sounds: Normal breath sounds.  Abdominal:     General: Bowel sounds are normal.     Palpations: Abdomen is soft.  Lymphadenopathy:     Cervical: No cervical adenopathy.  Skin:    General: Skin is warm and dry.     Capillary Refill: Capillary refill takes less than 2 seconds.  Neurological:     Mental Status: She is alert and oriented to person, place, and time.  Psychiatric:        Behavior: Behavior normal.      Musculoskeletal Exam: C-spine, thoracic spine, lumbar spine good range of motion.  No midline spinal tenderness.  No SI joint tenderness.  Right shoulder has full range of motion with no discomfort.  She has discomfort with internal rotation of the left shoulder status post rotator cuff repair.  Elbow joints, wrist joints, MCPs, PIPs, DIPs good range of motion no synovitis.  She has PIP and DIP synovial thickening consistent with osteoarthritis of bilateral hands.  She has right CMC joint tenderness.  She has tenderness of the first MCP joint and third and fourth PIP joints of the right hand.  Hip joints, knee joints,  ankle joints, MTPs, PIPs and DIPs good range of motion no synovitis.  No warmth or effusion of bilateral knee joints were noted.  She has bilateral knee crepitus.  No tenderness or swelling of ankle joints.  She has PIP and DIP synovial thickening consistent with osteoarthritis of bilateral feet.  CDAI Exam: CDAI Score: -- Patient Global: --; Provider Global: -- Swollen: 0 ; Tender: 4  Joint Exam      Right  Left  CMC   Tender     MCP 1   Tender     PIP 3   Tender     PIP 4   Tender        Investigation: No additional findings.  Imaging: No results found.  Recent Labs: Lab Results  Component Value Date   WBC 6.8 02/21/2019   HGB 14.7 02/21/2019   PLT 332 02/21/2019   NA 140 02/21/2019   K 4.1 02/21/2019   CL 105 02/21/2019   CO2 27 02/21/2019   GLUCOSE 126 (H) 02/21/2019   BUN 14 02/21/2019   CREATININE 0.99 02/21/2019   BILITOT 1.1  02/21/2019   ALKPHOS 55 02/21/2019   AST 39 02/21/2019   ALT 29 02/21/2019   PROT 7.1 02/21/2019   ALBUMIN 4.3 02/21/2019   CALCIUM 9.2 02/21/2019   GFRAA >60 02/21/2019   QFTBGOLD Negative 06/16/2017   QFTBGOLDPLUS Negative 07/27/2018    Speciality Comments: REMICADE 3 mg/kg x 6 weeks TB GOLD negative 06/16/2017  Procedures:  No procedures performed Allergies: Augmentin [amoxicillin-pot clavulanate], Codeine, Erythromycin, Sulfa antibiotics, and Tramadol   Assessment / Plan:     Visit Diagnoses: Uveitis -She has not had any recent uveitis flares.  She states that 1-2 times per month she does experience some right eye pain denies any floaters or photophobia at this time.  She has chronic eye dryness bilaterally.  She continues to see her ophthalmologist on a regular basis.  According to the patient her ophthalmologist recommended staying on the current treatment regimen.  She will continue and Remicade 3mg /kg infusions every 6 weeks, methotrexate 10 tablets by mouth once weekly, folic acid 2 mg by mouth daily.  She does not need any refills at this time.  She is advised to notify if show signs or symptoms of uveitis flare.  She will follow-up in the office in 5 months.  Sarcoidosis - She was previously evaluated by Dr. [place] a couple of years ago and there was no involvement of the lungs at the time.  She had a chest x-ray on 02/24/2017 that did not reveal any edema or consolidation.  Interstitium was unremarkable.  No adenopathy was noted.  She does not have any new or worsening pulmonary symptoms at this time.   High risk medications (not anticoagulants) long-term use -Remicade IV infusion 3 mg/kg every 6 weeks, methotrexate 2.5 mg 10 tablets every 7 days, and folic acid 1 mg 2 tablets daily.  Last TB gold negative on 07/27/2018 and will monitor yearly.  Most recent CBC/CMP within normal limits on 02/21/2019 and will monitor with every other infusion.  She received the flu vaccine in March  and is up-to-date with the pneumonia vaccines.  I discussed the importance of holding Remicade infusions and methotrexate tablets if she develops signs or symptoms of an infection and to resume once infection is cleared.  I also discussed the importance of having yearly skin exams while on Remicade due to the increased risk of melanoma  Pulmonary nodules  Primary osteoarthritis  of both hands -She has PIP and DIP synovial thickening consistent with osteoarthritis of bilateral hands.  She is complete fist motion bilaterally.  She has tenderness as described above.  No obvious synovitis was noted.  X-rays of both hands were obtained today.  We will call with x-ray and lab results.  Pain in both hands -She presents today with increased pain in bilateral hands.  She gives a history of intermittent swelling and increased deafness in both hands.  She has been experiencing nocturnal pain.  She has complete fist formation bilaterally.  She has tenderness of the right third and fourth PIP joints and right CMC and first MCP joint.  She is unsure if her hand pain and swelling improves after Remicade infusions.  Her next infusion is on Tuesday and she is going to see if she has any response after the infusion.  No synovitis was noted on exam today.  She has been applying Voltaren gel topically as needed and has been wearing copper/battery-operated gloves.  X-rays of both hands were obtained today.  We will call her with these results.  We will obtain the following lab work including RF, CCP, 14 3 3  eta, and sed rate.  She can continue using Voltaren gel topically as needed.  She is given a handout of hand exercises to perform.  Joint protection and muscle strengthening were discussed plan: XR Hand 2 View Left, XR Hand 2 View Right, Cyclic citrul peptide antibody, IgG, 14-3-3 eta Protein, Rheumatoid factor, Sedimentation rate  Primary osteoarthritis of both knees - She has good range of motion with no discomfort.  No  warmth or effusion was noted.  She has bilateral knee crepitus.  She has no discomfort at this time  DDD (degenerative disc disease), lumbar -No midline spinal tenderness.    S/P left rotator cuff repair: Dr. Supple-September 2019, She has good ROM with some discomfort with internal rotation.   Other medical conditions are listed as follows:   Gastroesophageal reflux disease without esophagitis   SVT (supraventricular tachycardia) (HCC)  Essential hypertension    Orders: Orders Placed This Encounter  Procedures   XR Hand 2 View Left   XR Hand 2 View Right   Cyclic citrul peptide antibody, IgG   14-3-3 eta Protein   Rheumatoid factor   Sedimentation rate   No orders of the defined types were placed in this encounter.   Face-to-face time spent with patient was 30 minutes. Greater than 50% of time was spent in counseling and coordination of care.  Follow-Up Instructions: Return in about 5 months (around 10/02/2019) for Sarcoidosis, Uveitis .   Gearldine Bienenstockaylor M Kaitlyne Friedhoff, PA-C  Note - This record has been created using Dragon software.  Chart creation errors have been sought, but may not always  have been located. Such creation errors do not reflect on  the standard of medical care.

## 2019-04-27 ENCOUNTER — Other Ambulatory Visit: Payer: Self-pay | Admitting: *Deleted

## 2019-04-27 NOTE — Progress Notes (Signed)
Infusion orders are current for patient CBC CMP Tylenol Benadryl appointments are up to date and follow up appointment  is scheduled TB gold not due yet.  

## 2019-05-02 ENCOUNTER — Encounter: Payer: Self-pay | Admitting: Physician Assistant

## 2019-05-02 ENCOUNTER — Ambulatory Visit (INDEPENDENT_AMBULATORY_CARE_PROVIDER_SITE_OTHER): Payer: Medicare Other | Admitting: Physician Assistant

## 2019-05-02 ENCOUNTER — Ambulatory Visit (INDEPENDENT_AMBULATORY_CARE_PROVIDER_SITE_OTHER): Payer: Medicare Other

## 2019-05-02 ENCOUNTER — Other Ambulatory Visit: Payer: Self-pay

## 2019-05-02 ENCOUNTER — Ambulatory Visit: Payer: Self-pay

## 2019-05-02 VITALS — BP 120/74 | HR 66 | Resp 12 | Ht 63.0 in | Wt 144.4 lb

## 2019-05-02 DIAGNOSIS — I10 Essential (primary) hypertension: Secondary | ICD-10-CM | POA: Diagnosis not present

## 2019-05-02 DIAGNOSIS — M79642 Pain in left hand: Secondary | ICD-10-CM | POA: Diagnosis not present

## 2019-05-02 DIAGNOSIS — M19041 Primary osteoarthritis, right hand: Secondary | ICD-10-CM

## 2019-05-02 DIAGNOSIS — K219 Gastro-esophageal reflux disease without esophagitis: Secondary | ICD-10-CM

## 2019-05-02 DIAGNOSIS — Z79899 Other long term (current) drug therapy: Secondary | ICD-10-CM

## 2019-05-02 DIAGNOSIS — M79641 Pain in right hand: Secondary | ICD-10-CM | POA: Diagnosis not present

## 2019-05-02 DIAGNOSIS — M170 Bilateral primary osteoarthritis of knee: Secondary | ICD-10-CM

## 2019-05-02 DIAGNOSIS — D869 Sarcoidosis, unspecified: Secondary | ICD-10-CM | POA: Diagnosis not present

## 2019-05-02 DIAGNOSIS — M19042 Primary osteoarthritis, left hand: Secondary | ICD-10-CM

## 2019-05-02 DIAGNOSIS — H209 Unspecified iridocyclitis: Secondary | ICD-10-CM

## 2019-05-02 DIAGNOSIS — Z9889 Other specified postprocedural states: Secondary | ICD-10-CM | POA: Diagnosis not present

## 2019-05-02 DIAGNOSIS — R918 Other nonspecific abnormal finding of lung field: Secondary | ICD-10-CM

## 2019-05-02 DIAGNOSIS — I471 Supraventricular tachycardia: Secondary | ICD-10-CM

## 2019-05-02 DIAGNOSIS — M5136 Other intervertebral disc degeneration, lumbar region: Secondary | ICD-10-CM

## 2019-05-02 NOTE — Patient Instructions (Addendum)
Standing Labs We placed an order today for your standing lab work.    Please come back and get your standing labs in August and every 3 months   We have open lab daily Monday through Thursday from 8:30-12:30 PM and 1:30-4:30 PM and Friday from 8:30-12:30 PM and 1:30 -4:00 PM at the office of Dr. Bo Merino.   You may experience shorter wait times on Monday and Friday afternoons. The office is located at [address] [place], [place], [place] [address] No appointment is necessary.   Labs are drawn by [facility].  You may receive a bill from [facility] for your lab work.  If you wish to have your labs drawn at another location, please call the office 24 hours in advance to send orders.  If you have any questions regarding directions or hours of operation,  please call [phone].   Just as a reminder please drink plenty of water prior to coming for your lab work. Thanks!   Hand Exercises Hand exercises can be helpful for almost anyone. These exercises can strengthen the hands, improve flexibility and movement, and increase blood flow to the hands. These results can make work and daily tasks easier. Hand exercises can be especially helpful for people who have joint pain from arthritis or have nerve damage from overuse (carpal tunnel syndrome). These exercises can also help people who have injured a hand. Exercises Most of these hand exercises are gentle stretching and motion exercises. It is usually safe to do them often throughout the day. Warming up your hands before exercise may help to reduce stiffness. You can do this with gentle massage or by placing your hands in warm water for 10-15 minutes. It is normal to feel some stretching, pulling, tightness, or mild discomfort as you begin new exercises. This will gradually improve. Stop an exercise right away if you feel sudden, severe pain or your pain gets worse. Ask your health care provider which exercises are best for you. Knuckle  bend or "claw" fist 1. Stand or sit with your arm, hand, and all five fingers pointed straight up. Make sure to keep your wrist straight during the exercise. 2. Gently bend your fingers down toward your palm until the tips of your fingers are touching the top of your palm. Keep your big knuckle straight and just bend the small knuckles in your fingers. 3. Hold this position for __________ seconds. 4. Straighten (extend) your fingers back to the starting position. Repeat this exercise 5-10 times with each hand. Full finger fist 1. Stand or sit with your arm, hand, and all five fingers pointed straight up. Make sure to keep your wrist straight during the exercise. 2. Gently bend your fingers into your palm until the tips of your fingers are touching the middle of your palm. 3. Hold this position for __________ seconds. 4. Extend your fingers back to the starting position, stretching every joint fully. Repeat this exercise 5-10 times with each hand. Straight fist 1. Stand or sit with your arm, hand, and all five fingers pointed straight up. Make sure to keep your wrist straight during the exercise. 2. Gently bend your fingers at the big knuckle, where your fingers meet your hand, and the middle knuckle. Keep the knuckle at the tips of your fingers straight and try to touch the bottom of your palm. 3. Hold this position for __________ seconds. 4. Extend your fingers back to the starting position, stretching every joint fully. Repeat this exercise 5-10 times with  each hand. Tabletop 1. Stand or sit with your arm, hand, and all five fingers pointed straight up. Make sure to keep your wrist straight during the exercise. 2. Gently bend your fingers at the big knuckle, where your fingers meet your hand, as far down as you can while keeping the small knuckles in your fingers straight. Think of forming a tabletop with your fingers. 3. Hold this position for __________ seconds. 4. Extend your fingers back  to the starting position, stretching every joint fully. Repeat this exercise 5-10 times with each hand. Finger spread 1. Place your hand flat on a table with your palm facing down. Make sure your wrist stays straight as you do this exercise. 2. Spread your fingers and thumb apart from each other as far as you can until you feel a gentle stretch. Hold this position for __________ seconds. 3. Bring your fingers and thumb tight together again. Hold this position for __________ seconds. Repeat this exercise 5-10 times with each hand. Making circles 1. Stand or sit with your arm, hand, and all five fingers pointed straight up. Make sure to keep your wrist straight during the exercise. 2. Make a circle by touching the tip of your thumb to the tip of your index finger. 3. Hold for __________ seconds. Then open your hand wide. 4. Repeat this motion with your thumb and each finger on your hand. Repeat this exercise 5-10 times with each hand. Thumb motion 1. Sit with your forearm resting on a table and your wrist straight. Your thumb should be facing up toward the ceiling. Keep your fingers relaxed as you move your thumb. 2. Lift your thumb up as high as you can toward the ceiling. Hold for __________ seconds. 3. Bend your thumb across your palm as far as you can, reaching the tip of your thumb for the small finger (pinkie) side of your palm. Hold for __________ seconds. Repeat this exercise 5-10 times with each hand. Grip strengthening  1. Hold a stress ball or other soft ball in the middle of your hand. 2. Slowly increase the pressure, squeezing the ball as much as you can without causing pain. Think of bringing the tips of your fingers into the middle of your palm. All of your finger joints should bend when doing this exercise. 3. Hold your squeeze for __________ seconds, then relax. Repeat this exercise 5-10 times with each hand. Contact a health care provider if:  Your hand pain or discomfort  gets much worse when you do an exercise.  Your hand pain or discomfort does not improve within 2 hours after you exercise. If you have any of these problems, stop doing these exercises right away. Do not do them again unless your health care provider says that you can. Get help right away if:  You develop sudden, severe hand pain or swelling. If this happens, stop doing these exercises right away. Do not do them again unless your health care provider says that you can. This information is not intended to replace advice given to you by your health care provider. Make sure you discuss any questions you have with your health care provider. Document Released: 09/16/2015 Document Revised: 01/26/2019 Document Reviewed: 10/06/2018 Elsevier Patient Education  2020 [employer].

## 2019-05-03 NOTE — Progress Notes (Signed)
RF negative, CCP negative, and sed rate WNL.

## 2019-05-03 NOTE — Progress Notes (Signed)
I left a voicemail for the patient to return our call to discuss hand x-rays.  Please review x-rays results of both hands from 05/02/19.  Findings were consistent with osteoarthritis.  There were no MCP joint narrowing or erosive changes.

## 2019-05-06 LAB — RHEUMATOID FACTOR: Rheumatoid fact SerPl-aCnc: <14 IU/mL (ref <14)

## 2019-05-06 LAB — CYCLIC CITRUL PEPTIDE ANTIBODY, IGG: Cyclic Citrullin Peptide Ab: <16 UNITS

## 2019-05-06 LAB — SEDIMENTATION RATE: Sed Rate: 2 mm/h (ref 0–30)

## 2019-05-06 LAB — 14-3-3 ETA PROTEIN: 14-3-3 eta Protein: <0.2 ng/mL (ref <0.2)

## 2019-05-08 NOTE — Progress Notes (Signed)
14-3-3 eta negative.

## 2019-05-11 ENCOUNTER — Other Ambulatory Visit: Payer: Self-pay | Admitting: Cardiovascular Disease

## 2019-05-12 NOTE — Telephone Encounter (Signed)
Rx is prescribed by Dr Oval Linsey

## 2019-05-16 ENCOUNTER — Other Ambulatory Visit: Payer: Self-pay

## 2019-05-16 ENCOUNTER — Ambulatory Visit (HOSPITAL_COMMUNITY)
Admission: RE | Admit: 2019-05-16 | Discharge: 2019-05-16 | Disposition: A | Discharge status: Home / Self Care | Payer: Medicare Other | Source: Ambulatory Visit | Attending: Rheumatology | Admitting: Rheumatology

## 2019-05-16 DIAGNOSIS — D869 Sarcoidosis, unspecified: Secondary | ICD-10-CM

## 2019-05-16 LAB — COMPREHENSIVE METABOLIC PANEL
ALT: 17 U/L (ref 0–44)
AST: 21 U/L (ref 15–41)
Albumin: 3.9 g/dL (ref 3.5–5.0)
Alkaline Phosphatase: 53 U/L (ref 38–126)
Anion gap: 10 (ref 5–15)
BUN: 13 mg/dL (ref 8–23)
CO2: 23 mmol/L (ref 22–32)
Calcium: 9 mg/dL (ref 8.9–10.3)
Chloride: 104 mmol/L (ref 98–111)
Creatinine, Ser: 0.91 mg/dL (ref 0.44–1.00)
GFR calc Af Amer: >60 mL/min (ref >60)
GFR calc non Af Amer: >60 mL/min (ref >60)
Glucose, Bld: 125 mg/dL — ABNORMAL HIGH (ref 70–99)
Potassium: 3.4 mmol/L — ABNORMAL LOW (ref 3.5–5.1)
Sodium: 137 mmol/L (ref 135–145)
Total Bilirubin: 1 mg/dL (ref 0.3–1.2)
Total Protein: 6.5 g/dL (ref 6.5–8.1)

## 2019-05-16 LAB — CBC
HCT: 38.4 % (ref 36.0–46.0)
Hemoglobin: 13.1 g/dL (ref 12.0–15.0)
MCH: 30.8 pg (ref 26.0–34.0)
MCHC: 34.1 g/dL (ref 30.0–36.0)
MCV: 90.1 fL (ref 80.0–100.0)
Platelets: 285 10*3/uL (ref 150–400)
RBC: 4.26 MIL/uL (ref 3.87–5.11)
RDW: 13.6 % (ref 11.5–15.5)
WBC: 5.2 10*3/uL (ref 4.0–10.5)
nRBC: 0 % (ref 0.0–0.2)

## 2019-05-16 MED ORDER — ACETAMINOPHEN 325 MG PO TABS
ORAL_TABLET | ORAL | Status: AC
  Administered 2019-05-16: 650 mg via ORAL
  Filled 2019-05-16: qty 2

## 2019-05-16 MED ORDER — SODIUM CHLORIDE 0.9 % IV SOLN
3.0000 mg/kg | INTRAVENOUS | Status: DC
  Administered 2019-05-16: 200 mg via INTRAVENOUS
  Filled 2019-05-16: qty 20

## 2019-05-16 MED ORDER — DIPHENHYDRAMINE HCL 25 MG PO CAPS
25.0000 mg | ORAL_CAPSULE | ORAL | Status: DC
  Administered 2019-05-16: 25 mg via ORAL

## 2019-05-16 MED ORDER — DIPHENHYDRAMINE HCL 25 MG PO CAPS
ORAL_CAPSULE | ORAL | Status: AC
  Administered 2019-05-16: 25 mg via ORAL
  Filled 2019-05-16: qty 1

## 2019-05-16 MED ORDER — ACETAMINOPHEN 325 MG PO TABS
650.0000 mg | ORAL_TABLET | ORAL | Status: DC
  Administered 2019-05-16: 11:00:00 650 mg via ORAL

## 2019-05-30 ENCOUNTER — Telehealth: Payer: Self-pay | Admitting: Cardiovascular Disease

## 2019-05-30 NOTE — Telephone Encounter (Signed)
Recall -  lmtcb 05-30-19 °

## 2019-06-27 ENCOUNTER — Other Ambulatory Visit: Payer: Self-pay

## 2019-06-27 ENCOUNTER — Other Ambulatory Visit: Payer: Self-pay | Admitting: *Deleted

## 2019-06-27 ENCOUNTER — Ambulatory Visit (HOSPITAL_COMMUNITY)
Admission: RE | Admit: 2019-06-27 | Discharge: 2019-06-27 | Disposition: A | Discharge status: Home / Self Care | Payer: Medicare Other | Source: Ambulatory Visit | Attending: Rheumatology | Admitting: Rheumatology

## 2019-06-27 DIAGNOSIS — H209 Unspecified iridocyclitis: Secondary | ICD-10-CM

## 2019-06-27 DIAGNOSIS — D869 Sarcoidosis, unspecified: Secondary | ICD-10-CM

## 2019-06-27 LAB — COMPREHENSIVE METABOLIC PANEL
ALT: 14 U/L (ref 0–44)
AST: 22 U/L (ref 15–41)
Albumin: 4.2 g/dL (ref 3.5–5.0)
Alkaline Phosphatase: 53 U/L (ref 38–126)
Anion gap: 8 (ref 5–15)
BUN: 14 mg/dL (ref 8–23)
CO2: 26 mmol/L (ref 22–32)
Calcium: 9.1 mg/dL (ref 8.9–10.3)
Chloride: 106 mmol/L (ref 98–111)
Creatinine, Ser: 0.84 mg/dL (ref 0.44–1.00)
GFR calc Af Amer: >60 mL/min (ref >60)
GFR calc non Af Amer: >60 mL/min (ref >60)
Glucose, Bld: 134 mg/dL — ABNORMAL HIGH (ref 70–99)
Potassium: 3.3 mmol/L — ABNORMAL LOW (ref 3.5–5.1)
Sodium: 140 mmol/L (ref 135–145)
Total Bilirubin: 0.9 mg/dL (ref 0.3–1.2)
Total Protein: 7 g/dL (ref 6.5–8.1)

## 2019-06-27 LAB — CBC
HCT: 41.4 % (ref 36.0–46.0)
Hemoglobin: 13.7 g/dL (ref 12.0–15.0)
MCH: 30.4 pg (ref 26.0–34.0)
MCHC: 33.1 g/dL (ref 30.0–36.0)
MCV: 91.8 fL (ref 80.0–100.0)
Platelets: 306 10*3/uL (ref 150–400)
RBC: 4.51 MIL/uL (ref 3.87–5.11)
RDW: 13.7 % (ref 11.5–15.5)
WBC: 6.4 10*3/uL (ref 4.0–10.5)
nRBC: 0 % (ref 0.0–0.2)

## 2019-06-27 MED ORDER — ACETAMINOPHEN 325 MG PO TABS
ORAL_TABLET | ORAL | Status: AC
  Filled 2019-06-27: qty 2

## 2019-06-27 MED ORDER — DIPHENHYDRAMINE HCL 25 MG PO CAPS
ORAL_CAPSULE | ORAL | Status: AC
  Filled 2019-06-27: qty 1

## 2019-06-27 MED ORDER — ACETAMINOPHEN 325 MG PO TABS
650.0000 mg | ORAL_TABLET | ORAL | Status: AC
  Administered 2019-06-27: 11:00:00 650 mg via ORAL

## 2019-06-27 MED ORDER — DIPHENHYDRAMINE HCL 25 MG PO CAPS
25.0000 mg | ORAL_CAPSULE | ORAL | Status: AC
  Administered 2019-06-27: 25 mg via ORAL

## 2019-06-27 MED ORDER — SODIUM CHLORIDE 0.9 % IV SOLN
3.0000 mg/kg | INTRAVENOUS | Status: DC
  Administered 2019-06-27: 200 mg via INTRAVENOUS
  Filled 2019-06-27: qty 20

## 2019-06-27 NOTE — Progress Notes (Signed)
Potassium is low.  Please notify patient and forward results to her PCP.

## 2019-07-03 ENCOUNTER — Other Ambulatory Visit: Payer: Self-pay | Admitting: Rheumatology

## 2019-07-03 NOTE — Telephone Encounter (Signed)
Last Visit: 05/02/19 Next Visit: 10/05/19 Labs: 06/27/19 Potassium is low  Okay to refill per Dr. Estanislado Pandy

## 2019-07-05 ENCOUNTER — Encounter: Payer: Self-pay | Admitting: Primary Care

## 2019-07-05 ENCOUNTER — Other Ambulatory Visit: Payer: Self-pay

## 2019-07-05 ENCOUNTER — Ambulatory Visit (INDEPENDENT_AMBULATORY_CARE_PROVIDER_SITE_OTHER): Payer: Medicare Other | Admitting: Primary Care

## 2019-07-05 ENCOUNTER — Other Ambulatory Visit: Payer: Self-pay | Admitting: Physician Assistant

## 2019-07-05 VITALS — BP 124/80 | HR 60 | Temp 97.8°F | Ht 63.0 in | Wt 144.5 lb

## 2019-07-05 DIAGNOSIS — I10 Essential (primary) hypertension: Secondary | ICD-10-CM | POA: Diagnosis not present

## 2019-07-05 MED ORDER — AMLODIPINE BESYLATE 5 MG PO TABS: 5.0000 mg | ORAL_TABLET | Freq: Every day | ORAL | 0 refills | Status: DC

## 2019-07-05 NOTE — Progress Notes (Signed)
Subjective:    Patient ID: Renee Jones, female    DOB: 12-18-1950, 68 y.o.   MRN: [id number]  HPI  Renee Jones is a 68 year old female with a history of hypertension managed on HCTZ, SVT, osteoarthritis on immune suppressive treatment, diastolic dysfunction, glaucoma who presents today for hypokalemia.  She was sent to day by her rheumatologist for a potassium level of 3.3 from labs drawn on 06/27/19. Labs also include glucose reading of 134. CBC and other electrolytes were within normal range. Labs from July 2020 with potassium level of 3.4. Potassium in May 2020 of 4.1 and 3.7 in February 2020, December 2019, October 2019.  BP Readings from Last 3 Encounters:  07/05/19 124/80  06/27/19 140/60  05/16/19 136/66   She does not take a potassium supplement, she does take a multi-vitamin. She checks her BP at home which runs 130's/70's with occasional "elevations". Her goal BP is below 130/80.   She has some myalgias recently. Denies   Review of Systems  Eyes: Negative for visual disturbance.  Respiratory: Negative for shortness of breath.   Cardiovascular: Negative for chest pain.  Musculoskeletal:       Chronic myalgias  Neurological: Negative for dizziness and headaches.       Past Medical History:  Diagnosis Date  . Allergy   . Asthma   . Bronchial spasm 09/14/2016  . DDD (degenerative disc disease), lumbar 09/14/2016  . Detached retina 09/14/2016  . Diastolic dysfunction 40/12/4740  . Essential hypertension 08/28/2015  . GERD (gastroesophageal reflux disease)   . Glaucoma 09/14/2016  . Neuromuscular disorder ([place])   . Osteoarthritis of both hands 09/14/2016  . Osteoarthritis of both knees 09/14/2016  . Sarcoidosis    with +lymph node biopsy  . SVT (supraventricular tachycardia) ([place]) 08/28/2015  . Tachycardia   . Uveitis      Social History   Socioeconomic History  . Marital status: Married    Spouse name: Not on file  . Number of children: Not on file   . Years of education: Not on file  . Highest education level: Not on file  Occupational History  . Not on file  Social Needs  . Financial resource strain: Not on file  . Food insecurity    Worry: Not on file    Inability: Not on file  . Transportation needs    Medical: Not on file    Non-medical: Not on file  Tobacco Use  . Smoking status: Never Smoker  . Smokeless tobacco: Never Used  Substance and Sexual Activity  . Alcohol use: No    Alcohol/week: 0.0 standard drinks  . Drug use: No  . Sexual activity: Not on file  Lifestyle  . Physical activity    Days per week: Not on file    Minutes per session: Not on file  . Stress: Not on file  Relationships  . Social Herbalist on phone: Not on file    Gets together: Not on file    Attends religious service: Not on file    Active member of club or organization: Not on file    Attends meetings of clubs or organizations: Not on file    Relationship status: Not on file  . Intimate partner violence    Fear of current or ex partner: Not on file    Emotionally abused: Not on file    Physically abused: Not on file    Forced sexual activity: Not on file  Other Topics Concern  . Not on file  Social History Narrative   Widower.   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    Past Surgical History:  Procedure Laterality Date  . CATARACT EXTRACTION, BILATERAL  2018  . CYSTECTOMY    . EYE SURGERY Bilateral 2018   cataract removal   . LUMBAR SPINE SURGERY  2009  . ROTATOR CUFF REPAIR Right 2011  . ROTATOR CUFF REPAIR Left 06/2018  . TONSILLECTOMY AND ADENOIDECTOMY  1962  . TOTAL ABDOMINAL HYSTERECTOMY  1984    Family History  Adopted: Yes  Problem Relation Age of Onset  . Scleroderma Daughter     Allergies  Allergen Reactions  . Augmentin [Amoxicillin-Pot Clavulanate]   . Codeine Itching  . Erythromycin Itching  . Sulfa Antibiotics Itching     vomiting  . Tramadol Itching    Current  Outpatient Medications on File Prior to Visit  Medication Sig Dispense Refill  . albuterol (PROVENTIL HFA;VENTOLIN HFA) 108 (90 BASE) MCG/ACT inhaler Inhale 2 puffs into the lungs every 6 (six) hours as needed for wheezing or shortness of breath.    . cholecalciferol (VITAMIN D) 1000 units tablet Take 1,000 Units by mouth daily.    . diclofenac sodium (VOLTAREN) 1 % GEL Apply three grams to three large joints up to three times daily. 3 Tube 3  . fluticasone (FLONASE) 50 MCG/ACT nasal spray Place 2 sprays into both nostrils daily as needed for allergies or rhinitis.    . folic acid (FOLVITE) 1 MG tablet Take 2 tablets (2 mg total) by mouth daily. 180 tablet 4  . InFLIXimab (REMICADE IV) Inject into the vein every 6 (six) weeks.    . methotrexate (RHEUMATREX) 2.5 MG tablet TAKE 10 TABLETS BY MOUTH ONCE A WEEK 120 tablet 0  . metoprolol tartrate (LOPRESSOR) 50 MG tablet TAKE 1 AND 1/2 TABLETS BY MOUTH TWICE DAILY 270 tablet 0  . Multiple Vitamin (MULTIVITAMIN WITH MINERALS) TABS tablet Take 1 tablet by mouth daily.    . naproxen sodium (ALEVE) 220 MG tablet Take 220 mg by mouth as needed.    . ondansetron (ZOFRAN) 4 MG tablet Take 1 tablet (4 mg total) by mouth every 6 (six) hours as needed for nausea or vomiting. 30 tablet 0  . pantoprazole (PROTONIX) 40 MG tablet Take 1 tablet (40 mg total) by mouth daily. For heartburn. 90 tablet 3   No current facility-administered medications on file prior to visit.     BP 124/80   Pulse 60   Temp 97.8 F (36.6 C) (Temporal)   Ht 5\' 3"  (1.6 m)   Wt 144 lb 8 oz (65.5 kg)   SpO2 98%   BMI 25.60 kg/m    Objective:   Physical Exam  Constitutional: She appears well-nourished.  Neck: Neck supple.  Cardiovascular: Normal rate and regular rhythm.  Respiratory: Effort normal and breath sounds normal.  Skin: Skin is warm and dry.  Psychiatric: She has a normal mood and affect.           Assessment & Plan:

## 2019-07-05 NOTE — Assessment & Plan Note (Signed)
Stable in the office today on HCTZ, however, does have evidence of slight hypokalemia from labs dated September and July 2020, otherwise historically normal potassium.  Stop HCTZ as it may be contributing to hypokalemia. Start Amlodipine 5 mg daily.   We will plan to see her back in one week for BP check and repeat potassium. She will monitor BP at home.

## 2019-07-05 NOTE — Patient Instructions (Signed)
Stop taking hydrochlorothiazide 12.5 mg tablets for blood pressure.  Start Amlodipine 5 mg once daily for blood pressure.  Continue to monitor your blood pressure at home.  Schedule a follow up visit with me for one week for BP check and potassium check.  It was a pleasure to see you today!

## 2019-07-06 NOTE — Telephone Encounter (Signed)
Last Visit: 05/02/19 Next Visit: 10/05/19  Okay to refill per Dr. Estanislado Pandy

## 2019-07-11 ENCOUNTER — Other Ambulatory Visit: Payer: Self-pay | Admitting: Rheumatology

## 2019-07-12 NOTE — Telephone Encounter (Signed)
Last Visit: 05/02/19 Next Visit: 10/05/19   Okay to refill per Dr. Estanislado Pandy

## 2019-07-18 DIAGNOSIS — H0288B Meibomian gland dysfunction left eye, upper and lower eyelids: Secondary | ICD-10-CM | POA: Diagnosis not present

## 2019-07-18 DIAGNOSIS — H16223 Keratoconjunctivitis sicca, not specified as Sjogren's, bilateral: Secondary | ICD-10-CM | POA: Diagnosis not present

## 2019-07-18 DIAGNOSIS — H40013 Open angle with borderline findings, low risk, bilateral: Secondary | ICD-10-CM | POA: Diagnosis not present

## 2019-07-18 DIAGNOSIS — H11153 Pinguecula, bilateral: Secondary | ICD-10-CM | POA: Diagnosis not present

## 2019-07-18 DIAGNOSIS — H1045 Other chronic allergic conjunctivitis: Secondary | ICD-10-CM | POA: Diagnosis not present

## 2019-07-18 DIAGNOSIS — H0102A Squamous blepharitis right eye, upper and lower eyelids: Secondary | ICD-10-CM | POA: Diagnosis not present

## 2019-07-18 DIAGNOSIS — H0102B Squamous blepharitis left eye, upper and lower eyelids: Secondary | ICD-10-CM | POA: Diagnosis not present

## 2019-07-18 DIAGNOSIS — H18413 Arcus senilis, bilateral: Secondary | ICD-10-CM | POA: Diagnosis not present

## 2019-07-18 DIAGNOSIS — H43813 Vitreous degeneration, bilateral: Secondary | ICD-10-CM | POA: Diagnosis not present

## 2019-07-18 DIAGNOSIS — H11823 Conjunctivochalasis, bilateral: Secondary | ICD-10-CM | POA: Diagnosis not present

## 2019-07-18 DIAGNOSIS — H0288A Meibomian gland dysfunction right eye, upper and lower eyelids: Secondary | ICD-10-CM | POA: Diagnosis not present

## 2019-07-28 ENCOUNTER — Other Ambulatory Visit (INDEPENDENT_AMBULATORY_CARE_PROVIDER_SITE_OTHER): Payer: Medicare Other

## 2019-07-28 DIAGNOSIS — I10 Essential (primary) hypertension: Secondary | ICD-10-CM

## 2019-07-28 LAB — POTASSIUM: Potassium: 3.8 mEq/L (ref 3.5–5.1)

## 2019-07-28 NOTE — Addendum Note (Signed)
Addended by: Cloyd Stagers on: 07/28/2019 01:03 PM   Modules accepted: Orders

## 2019-08-04 ENCOUNTER — Other Ambulatory Visit: Payer: Self-pay | Admitting: *Deleted

## 2019-08-04 NOTE — Progress Notes (Signed)
Infusion orders are current for patient CBC CMP Tylenol Benadryl appointments are up to date and follow up appointment  is scheduled TB gold due and order placed.  

## 2019-08-08 ENCOUNTER — Ambulatory Visit (HOSPITAL_COMMUNITY)
Admission: RE | Admit: 2019-08-08 | Discharge: 2019-08-08 | Disposition: A | Discharge status: Home / Self Care | Payer: Medicare Other | Source: Ambulatory Visit | Attending: Rheumatology | Admitting: Rheumatology

## 2019-08-08 ENCOUNTER — Other Ambulatory Visit: Payer: Self-pay

## 2019-08-08 DIAGNOSIS — H209 Unspecified iridocyclitis: Secondary | ICD-10-CM

## 2019-08-08 DIAGNOSIS — D869 Sarcoidosis, unspecified: Secondary | ICD-10-CM | POA: Insufficient documentation

## 2019-08-08 MED ORDER — ACETAMINOPHEN 325 MG PO TABS
650.0000 mg | ORAL_TABLET | ORAL | Status: DC
  Administered 2019-08-08: 11:00:00 650 mg via ORAL

## 2019-08-08 MED ORDER — DIPHENHYDRAMINE HCL 25 MG PO CAPS
25.0000 mg | ORAL_CAPSULE | ORAL | Status: DC
  Administered 2019-08-08: 25 mg via ORAL

## 2019-08-08 MED ORDER — ACETAMINOPHEN 325 MG PO TABS
ORAL_TABLET | ORAL | Status: AC
  Filled 2019-08-08: qty 2

## 2019-08-08 MED ORDER — DIPHENHYDRAMINE HCL 25 MG PO CAPS
ORAL_CAPSULE | ORAL | Status: AC
  Filled 2019-08-08: qty 1

## 2019-08-08 MED ORDER — SODIUM CHLORIDE 0.9 % IV SOLN
3.0000 mg/kg | INTRAVENOUS | Status: DC
  Administered 2019-08-08: 200 mg via INTRAVENOUS
  Filled 2019-08-08: qty 20

## 2019-08-09 ENCOUNTER — Other Ambulatory Visit: Payer: Self-pay | Admitting: Primary Care

## 2019-08-09 DIAGNOSIS — I10 Essential (primary) hypertension: Secondary | ICD-10-CM

## 2019-08-09 NOTE — Telephone Encounter (Signed)
Per lab note on 07/28/2019 patient is to continue taking Amlodipine. Refill sent in

## 2019-08-11 LAB — QUANTIFERON-TB GOLD PLUS (RQFGPL)
QuantiFERON Mitogen Value: >10 IU/mL
QuantiFERON Nil Value: 0.04 IU/mL
QuantiFERON TB1 Ag Value: 0.04 IU/mL
QuantiFERON TB2 Ag Value: 0.04 IU/mL

## 2019-08-11 LAB — QUANTIFERON-TB GOLD PLUS: QuantiFERON-TB Gold Plus: NEGATIVE

## 2019-08-11 NOTE — Progress Notes (Signed)
TB Gold negative

## 2019-08-14 ENCOUNTER — Telehealth: Payer: Self-pay | Admitting: *Deleted

## 2019-08-14 NOTE — Telephone Encounter (Signed)
Left message to call back  Appointment with Dr Oval Linsey 10/28 needs to be virtual, Dr Oval Linsey not in office

## 2019-08-15 NOTE — Telephone Encounter (Signed)
Left detailed message, ok per DPR Dr Oval Linsey will not be in the office 10/28 please call back to do either virtual visit or reschedule

## 2019-08-16 ENCOUNTER — Ambulatory Visit: Payer: Medicare Other | Admitting: Cardiovascular Disease

## 2019-08-16 ENCOUNTER — Other Ambulatory Visit: Payer: Self-pay

## 2019-08-16 NOTE — Telephone Encounter (Signed)
Called patient 2 times today regarding appointment. Unable to reach, if patient calls back needs to reschedule visit

## 2019-08-17 NOTE — Telephone Encounter (Signed)
Patient rescheduled visit.

## 2019-08-22 DIAGNOSIS — Z1231 Encounter for screening mammogram for malignant neoplasm of breast: Secondary | ICD-10-CM | POA: Diagnosis not present

## 2019-08-22 LAB — HM MAMMOGRAPHY

## 2019-08-30 ENCOUNTER — Encounter: Payer: Self-pay | Admitting: Cardiovascular Disease

## 2019-08-30 ENCOUNTER — Telehealth (INDEPENDENT_AMBULATORY_CARE_PROVIDER_SITE_OTHER): Payer: Medicare Other | Admitting: Cardiovascular Disease

## 2019-08-30 VITALS — BP 127/88 | HR 97 | Ht 63.0 in | Wt 139.0 lb

## 2019-08-30 DIAGNOSIS — I5032 Chronic diastolic (congestive) heart failure: Secondary | ICD-10-CM

## 2019-08-30 DIAGNOSIS — I471 Supraventricular tachycardia: Secondary | ICD-10-CM | POA: Diagnosis not present

## 2019-08-30 DIAGNOSIS — R0789 Other chest pain: Secondary | ICD-10-CM

## 2019-08-30 DIAGNOSIS — I10 Essential (primary) hypertension: Secondary | ICD-10-CM | POA: Diagnosis not present

## 2019-08-30 DIAGNOSIS — R42 Dizziness and giddiness: Secondary | ICD-10-CM | POA: Diagnosis not present

## 2019-08-30 NOTE — Progress Notes (Signed)
Cardiology Office Note   Date:  08/30/2019   ID:  Vinia Jemmott, DOB Oct 12, 1951, MRN [id number]  PCP:  Pleas Koch, NP  Cardiologist:   Skeet Latch, MD  EP: Allegra Lai, MD   No chief complaint on file.   Patient ID:   Renee Jones is a 68 y.o. female paroxysmal SVT, HTN, GERD, and sarcoidosis.    History of Present Illness: Renee Jones reports episodes of SVT that have been occurring for as long as Renee Jones can remember.  Renee Jones previously took a beta blocker which helped her symptoms but stopped taking it 20 years ago because Renee Jones didn't think Renee Jones needed it anymore.  Renee Jones was previously a patient of Dr. Thompson Caul and had a Holter monitor documenting SVT.  Renee Jones was evaluated in the ED 02/2015 with elevated BP to 197/120.   Renee Jones had a cardiac MRI 10/2012 that did not reveal any delayed enhancement concerning for cardiac involvement of her sarcoidosis.  Renee Jones underwent CPX testing to evaluate shortness of breath and it was suggestive of diastolic dysfunction.  Renee Jones was started on metoprolol for SVT and it also helped her shortness of breath.  Renee Jones was referred for an echo 08/2015 that revealed LVEF 55-60% with grade 1 diastolic dysfunction. Renee Jones saw Dr. Curt Bears and has not been interested in ablation. Metoprolol was increased with an improvement in her symptoms.  Her blood pressure was poorly-controlled so hydrochlorothiazide was added and losartan was reduced to 50 mg. Her PCP switched HCTZ to amlodipine 06/2019 due to hypokalemia. Prior to that change Renee Jones noted episodes of vibrations in her chest.  This has been ongoing since March.  It felt like her phone was in her chest pocket.  Renee Jones also had discomfort radiating into her back that felt similar to when Renee Jones had myocarditis.  Since switching her medication the symptoms have subsided.  Renee Jones also reports some episodes of lightheadedness.  This has been ongoing for a little over a month.  It occurs when lying in bed, when bending  over, or when turning over.  Renee Jones denies any sinus congestion.  Renee Jones walks daily for exercise.  Renee Jones gets a little short of breath at times but has no exertional chest pain or pressure.  Renee Jones denies lower extremity edema, orthopnea, or PND.  Lately her blood pressure has been in the 110s to 130s over 70s to 80s.   Past Medical History:  Diagnosis Date  . Allergy   . Asthma   . Bronchial spasm 09/14/2016  . DDD (degenerative disc disease), lumbar 09/14/2016  . Detached retina 09/14/2016  . Diastolic dysfunction 84/10/6604  . Essential hypertension 08/28/2015  . GERD (gastroesophageal reflux disease)   . Glaucoma 09/14/2016  . Neuromuscular disorder ([place])   . Osteoarthritis of both hands 09/14/2016  . Osteoarthritis of both knees 09/14/2016  . Sarcoidosis    with +lymph node biopsy  . SVT (supraventricular tachycardia) ([place]) 08/28/2015  . Tachycardia   . Uveitis     Past Surgical History:  Procedure Laterality Date  . CATARACT EXTRACTION, BILATERAL  2018  . CYSTECTOMY    . EYE SURGERY Bilateral 2018   cataract removal   . LUMBAR SPINE SURGERY  2009  . ROTATOR CUFF REPAIR Right 2011  . ROTATOR CUFF REPAIR Left 06/2018  . [place]  . TOTAL ABDOMINAL HYSTERECTOMY  1984     Current Outpatient Medications  Medication Sig Dispense Refill  . albuterol (PROVENTIL HFA;VENTOLIN HFA) 108 (90 BASE)  MCG/ACT inhaler Inhale 2 puffs into the lungs every 6 (six) hours as needed for wheezing or shortness of breath.    Marland Kitchen amLODipine (NORVASC) 5 MG tablet TAKE 1 TABLET(5 MG) BY MOUTH DAILY FOR BLOOD PRESSURE 90 tablet 1  . cholecalciferol (VITAMIN D) 1000 units tablet Take 1,000 Units by mouth daily.    . diclofenac sodium (VOLTAREN) 1 % GEL Apply three grams to three large joints up to three times daily. (Patient taking differently: Apply three grams to three large joints up to three times daily as needed) 3 Tube 3  . fluticasone (FLONASE) 50 MCG/ACT nasal spray Place 2  sprays into both nostrils daily as needed for allergies or rhinitis.    . folic acid (FOLVITE) 1 MG tablet Take 2 tablets by mouth once daily 180 tablet 0  . InFLIXimab (REMICADE IV) Inject into the vein every 6 (six) weeks.    . methotrexate (RHEUMATREX) 2.5 MG tablet TAKE 10 TABLETS BY MOUTH ONCE A WEEK 120 tablet 0  . metoprolol tartrate (LOPRESSOR) 50 MG tablet TAKE 1 AND 1/2 TABLETS BY MOUTH TWICE DAILY 270 tablet 0  . Multiple Vitamin (MULTIVITAMIN WITH MINERALS) TABS tablet Take 1 tablet by mouth daily.    . naproxen sodium (ALEVE) 220 MG tablet Take 220 mg by mouth as needed.    . ondansetron (ZOFRAN) 4 MG tablet TAKE 1 TABLET BY MOUTH EVERY 6 HOURS AS NEEDED FOR NAUSEA AND VOMITING 30 tablet 0  . pantoprazole (PROTONIX) 40 MG tablet Take 1 tablet (40 mg total) by mouth daily. For heartburn. 90 tablet 3   No current facility-administered medications for this visit.     Allergies:   Augmentin [amoxicillin-pot clavulanate], Codeine, Erythromycin, Sulfa antibiotics, and Tramadol    Social History:  The patient  reports that Renee Jones has never smoked. Renee Jones has never used smokeless tobacco. Renee Jones reports that Renee Jones does not drink alcohol or use drugs.   Family History:  The patient's family history includes Scleroderma in her daughter. Renee Jones was adopted.    ROS:  Please see the history of present illness.   Otherwise, review of systems are positive for none.   All other systems are reviewed and negative.    PHYSICAL EXAM: VS:  BP 127/88   Pulse 97   Ht 5\' 3"  (1.6 m)   Wt 139 lb (63 kg)   BMI 24.62 kg/m  , BMI Body mass index is 24.62 kg/m. GENERAL:  Well appearing HEENT:  Pupils equal round and reactive, fundi not visualized, oral mucosa unremarkable NECK:  No jugular venous distention LUNGS: Respirations SKIN:  No rashes no nodules NEURO:  Cranial nerves II through XII grossly intact, motor grossly intact throughout PSYCH:  Cognitively intact, oriented to person place and time  EKG:   EKG is not ordered today. 05/04/16:Sinus rhythm.  Rate 74 bpm.  Inferior TWI. 11/18/16: Sinus rhythm. Rate 72 bpm. We will monitor 08/02/2018: Sinus rhythm.  Rate 64 bpm.  Recent Labs: 06/27/2019: ALT 14; BUN 14; Creatinine, Ser 0.84; Hemoglobin 13.7; Platelets 306; Sodium 140 07/28/2019: Potassium 3.8    Lipid Panel    Component Value Date/Time   CHOL 190 12/21/2018 1018   TRIG 171.0 (H) 12/21/2018 1018   HDL 47.90 12/21/2018 1018   CHOLHDL 4 12/21/2018 1018   VLDL 34.2 12/21/2018 1018   LDLCALC 108 (H) 12/21/2018 1018      Wt Readings from Last 3 Encounters:  08/30/19 139 lb (63 kg)  08/08/19 140 lb (63.5 kg)  07/05/19  144 lb 8 oz (65.5 kg)     Other studies Reviewed: Additional studies/ records that were reviewed today include: PCP note  Review of the above records demonstrates:  Please see elsewhere in the note.     CPX 05/01/15: Conclusion: Exercise testing with gas-exchange demonstrates a mild functional impairment when compared to matched sedentary norms. There is no significant evidence of EIB. The combination of reduced PVO2, hypertensive BP, elevated VE/VCO2 slope, and flat O2 pulse suggests possible circulatory limitations (specifically diastolic dysfunction).   2014 CMRI 1. Normal LV size and systolic function, EF 69%.  2. Normal RV size and systolic function.  3. No myocardial delayed enhancement, so no definitive evidence for prior MI, infiltrative disease, or myocarditis.  4. No evidence on this exam for cardiac sarcoidosis.   ASSESSMENT AND PLAN:  # SVT: No recent episodes of persistent SVT.  It is unclear if the episode Renee Jones was having prior to switching from hydrochlorothiazide were SVT.  It seems to have subsided since stopping that medication.  Continue metoprolol.  # Diastolic dysfunction: CPX was suggestive of diastolic dysfunction and her symptoms have improved with metoprolol.  However, her echo only showed grade 1 diastolic function and Renee Jones  does not clinically appear to have heart failure. Continue metoprolol and amlodipine.  # Hypertension: BP remains well-controlled on amlodipine and metoprolol.  # Sarcoidosis: No evidence of cardiac involvement on MRI 10/2012.  # Chest pain: Resolved.  No exertional symptoms.  # Dizziness: Her symptoms occur both when lying in bed and with positional changes.  Renee Jones does not have any recent upper respiratory infections, though the fact that it happens lying in bed makes me suspicious of vertigo.  Renee Jones will check orthostatic vital signs.  Encouraged hydration and compression stockings.   Current medicines are reviewed at length with the patient today.  The patient does not have concerns regarding medicines.  The following changes have been made:  None  Labs/ tests ordered today include: none.  No orders of the defined types were placed in this encounter.    Disposition:   FU with Dr. Elmarie Shileyiffany C. [place] in 6 months    Signed, Chilton Siiffany Chauvin, MD  08/30/2019 10:25 AM    [facility]

## 2019-08-30 NOTE — Patient Instructions (Addendum)
Medication Instructions:  Your physician recommends that you continue on your current medications as directed. Please refer to the Current Medication list given to you today.  *If you need a refill on your cardiac medications before your next appointment, please call your pharmacy*  Lab Work: NONE   Testing/Procedures: NONE   Follow-Up: 3 MONTHS IN OFFICE WITH DR [facility] ON 11/30/2019 AT 9:00 AM

## 2019-09-07 ENCOUNTER — Other Ambulatory Visit: Payer: Self-pay | Admitting: *Deleted

## 2019-09-07 NOTE — Progress Notes (Signed)
Infusion orders are current for patient CBC CMP Tylenol Benadryl appointments are up to date and follow up appointment  is scheduled TB gold not due yet.  

## 2019-09-19 ENCOUNTER — Other Ambulatory Visit: Payer: Self-pay

## 2019-09-19 ENCOUNTER — Ambulatory Visit (HOSPITAL_COMMUNITY)
Admission: RE | Admit: 2019-09-19 | Discharge: 2019-09-19 | Disposition: A | Discharge status: Home / Self Care | Payer: Medicare Other | Source: Ambulatory Visit | Attending: Rheumatology | Admitting: Rheumatology

## 2019-09-19 DIAGNOSIS — D869 Sarcoidosis, unspecified: Secondary | ICD-10-CM | POA: Insufficient documentation

## 2019-09-19 DIAGNOSIS — H209 Unspecified iridocyclitis: Secondary | ICD-10-CM | POA: Diagnosis not present

## 2019-09-19 LAB — COMPREHENSIVE METABOLIC PANEL
ALT: 24 U/L (ref 0–44)
AST: 28 U/L (ref 15–41)
Albumin: 4.3 g/dL (ref 3.5–5.0)
Alkaline Phosphatase: 57 U/L (ref 38–126)
Anion gap: 16 — ABNORMAL HIGH (ref 5–15)
BUN: 17 mg/dL (ref 8–23)
CO2: 21 mmol/L — ABNORMAL LOW (ref 22–32)
Calcium: 9.5 mg/dL (ref 8.9–10.3)
Chloride: 104 mmol/L (ref 98–111)
Creatinine, Ser: 0.94 mg/dL (ref 0.44–1.00)
GFR calc Af Amer: >60 mL/min (ref >60)
GFR calc non Af Amer: >60 mL/min (ref >60)
Glucose, Bld: 141 mg/dL — ABNORMAL HIGH (ref 70–99)
Potassium: 3.7 mmol/L (ref 3.5–5.1)
Sodium: 141 mmol/L (ref 135–145)
Total Bilirubin: 0.6 mg/dL (ref 0.3–1.2)
Total Protein: 7.1 g/dL (ref 6.5–8.1)

## 2019-09-19 LAB — CBC
HCT: 42.2 % (ref 36.0–46.0)
Hemoglobin: 14.7 g/dL (ref 12.0–15.0)
MCH: 31.3 pg (ref 26.0–34.0)
MCHC: 34.8 g/dL (ref 30.0–36.0)
MCV: 90 fL (ref 80.0–100.0)
Platelets: 349 10*3/uL (ref 150–400)
RBC: 4.69 MIL/uL (ref 3.87–5.11)
RDW: 13.2 % (ref 11.5–15.5)
WBC: 7.2 10*3/uL (ref 4.0–10.5)
nRBC: 0 % (ref 0.0–0.2)

## 2019-09-19 MED ORDER — ACETAMINOPHEN 325 MG PO TABS
ORAL_TABLET | ORAL | Status: AC
  Filled 2019-09-19: qty 2

## 2019-09-19 MED ORDER — ACETAMINOPHEN 325 MG PO TABS
650.0000 mg | ORAL_TABLET | ORAL | Status: AC
  Administered 2019-09-19: 650 mg via ORAL

## 2019-09-19 MED ORDER — DIPHENHYDRAMINE HCL 25 MG PO CAPS
ORAL_CAPSULE | ORAL | Status: AC
  Filled 2019-09-19: qty 1

## 2019-09-19 MED ORDER — SODIUM CHLORIDE 0.9 % IV SOLN
3.0000 mg/kg | INTRAVENOUS | Status: DC
  Administered 2019-09-19: 200 mg via INTRAVENOUS
  Filled 2019-09-19: qty 20

## 2019-09-19 MED ORDER — DIPHENHYDRAMINE HCL 25 MG PO CAPS
25.0000 mg | ORAL_CAPSULE | ORAL | Status: AC
  Administered 2019-09-19: 25 mg via ORAL

## 2019-09-19 NOTE — Progress Notes (Signed)
Glucose is mildly elevated.  Rest the labs are stable.  Please forward labs to her PCP.

## 2019-09-23 ENCOUNTER — Other Ambulatory Visit: Payer: Self-pay | Admitting: Rheumatology

## 2019-09-25 NOTE — Telephone Encounter (Signed)
Last Visit: 05/02/2019 Next Visit: 10/05/2019 Labs: 09/19/2019 Glucose is mildly elevated. Rest the labs are stable.  Okay to refill per Dr. Estanislado Pandy.

## 2019-10-04 ENCOUNTER — Ambulatory Visit: Payer: Medicare Other | Admitting: Orthopaedic Surgery

## 2019-10-05 ENCOUNTER — Ambulatory Visit: Payer: Medicare Other | Admitting: Rheumatology

## 2019-10-12 ENCOUNTER — Encounter: Payer: Self-pay | Admitting: Primary Care

## 2019-10-16 ENCOUNTER — Encounter: Payer: Self-pay | Admitting: Podiatry

## 2019-10-16 ENCOUNTER — Ambulatory Visit (INDEPENDENT_AMBULATORY_CARE_PROVIDER_SITE_OTHER): Payer: Medicare Other

## 2019-10-16 ENCOUNTER — Ambulatory Visit (INDEPENDENT_AMBULATORY_CARE_PROVIDER_SITE_OTHER): Payer: Medicare Other | Admitting: Podiatry

## 2019-10-16 ENCOUNTER — Other Ambulatory Visit: Payer: Self-pay

## 2019-10-16 ENCOUNTER — Other Ambulatory Visit: Payer: Self-pay | Admitting: Podiatry

## 2019-10-16 DIAGNOSIS — M19071 Primary osteoarthritis, right ankle and foot: Secondary | ICD-10-CM

## 2019-10-16 DIAGNOSIS — M7751 Other enthesopathy of right foot: Secondary | ICD-10-CM | POA: Diagnosis not present

## 2019-10-16 DIAGNOSIS — M779 Enthesopathy, unspecified: Secondary | ICD-10-CM

## 2019-10-16 DIAGNOSIS — M79674 Pain in right toe(s): Secondary | ICD-10-CM | POA: Diagnosis not present

## 2019-10-17 ENCOUNTER — Encounter: Payer: Self-pay | Admitting: Podiatry

## 2019-10-17 NOTE — Progress Notes (Signed)
Subjective:  Patient ID: Renee Jones, female    DOB: May 28, 1951,  MRN: [id number]  Chief Complaint  Patient presents with  . Toe Pain    Patient presents today for right hallux pain x 1-2 weeks.  She states " it feel like pins and needles when touched and I have sharp pains at times"      68 y.o. female presents with the above complaint.  Patient presents with pain in the right hallux that has been going on for about 1 to 2 weeks.  Patient states that it is sharp pain in nature.  Patient states it feels like pins and needle type of pain.  When touched, it causes a lot of pain to the medial side of the hallux.  It hurts when walking in shoes.  She has tried various shoe gear modification which has not helped.  She is tried over-the-counter pain patches which has not helped.  Patient is not a diabetic.  Patient has a history of sarcoidosis.   Review of Systems: Negative except as noted in the HPI. Denies N/V/F/Ch.  Past Medical History:  Diagnosis Date  . Allergy   . Asthma   . Bronchial spasm 09/14/2016  . DDD (degenerative disc disease), lumbar 09/14/2016  . Detached retina 09/14/2016  . Diastolic dysfunction 69/03/2951  . Essential hypertension 08/28/2015  . GERD (gastroesophageal reflux disease)   . Glaucoma 09/14/2016  . Neuromuscular disorder ([place])   . Osteoarthritis of both hands 09/14/2016  . Osteoarthritis of both knees 09/14/2016  . Sarcoidosis    with +lymph node biopsy  . SVT (supraventricular tachycardia) ([place]) 08/28/2015  . Tachycardia   . Uveitis     Current Outpatient Medications:  .  albuterol (PROVENTIL HFA;VENTOLIN HFA) 108 (90 BASE) MCG/ACT inhaler, Inhale 2 puffs into the lungs every 6 (six) hours as needed for wheezing or shortness of breath., Disp: , Rfl:  .  amLODipine (NORVASC) 5 MG tablet, TAKE 1 TABLET(5 MG) BY MOUTH DAILY FOR BLOOD PRESSURE, Disp: 90 tablet, Rfl: 1 .  cholecalciferol (VITAMIN D) 1000 units tablet, Take 1,000 Units by mouth  daily., Disp: , Rfl:  .  cyclobenzaprine (FLEXERIL) 10 MG tablet, TK 1 T PO Q 8 H PRF SPASM, Disp: , Rfl:  .  diclofenac sodium (VOLTAREN) 1 % GEL, Apply three grams to three large joints up to three times daily. (Patient taking differently: Apply three grams to three large joints up to three times daily as needed), Disp: 3 Tube, Rfl: 3 .  fluticasone (FLONASE) 50 MCG/ACT nasal spray, Place 2 sprays into both nostrils daily as needed for allergies or rhinitis., Disp: , Rfl:  .  folic acid (FOLVITE) 1 MG tablet, Take 2 tablets by mouth once daily, Disp: 180 tablet, Rfl: 0 .  hydrochlorothiazide (MICROZIDE) 12.5 MG capsule, Take 12.5 mg by mouth daily., Disp: , Rfl:  .  InFLIXimab (REMICADE IV), Inject into the vein every 6 (six) weeks., Disp: , Rfl:  .  methotrexate 2.5 MG tablet, TAKE 10 TABLETS BY MOUTH ONCE A WEEK, Disp: 120 tablet, Rfl: 0 .  metoprolol tartrate (LOPRESSOR) 50 MG tablet, TAKE 1 AND 1/2 TABLETS BY MOUTH TWICE DAILY, Disp: 270 tablet, Rfl: 0 .  Multiple Vitamin (MULTIVITAMIN WITH MINERALS) TABS tablet, Take 1 tablet by mouth daily., Disp: , Rfl:  .  naproxen sodium (ALEVE) 220 MG tablet, Take 220 mg by mouth as needed., Disp: , Rfl:  .  ondansetron (ZOFRAN) 4 MG tablet, TAKE 1 TABLET BY MOUTH EVERY  6 HOURS AS NEEDED FOR NAUSEA AND VOMITING, Disp: 30 tablet, Rfl: 0 .  pantoprazole (PROTONIX) 40 MG tablet, Take 1 tablet (40 mg total) by mouth daily. For heartburn., Disp: 90 tablet, Rfl: 3 .  VALIUM 2 MG tablet, PO As Needed, Disp: , Rfl:   Social History   Tobacco Use  Smoking Status Never Smoker  Smokeless Tobacco Never Used    Allergies  Allergen Reactions  . Augmentin [Amoxicillin-Pot Clavulanate]   . Codeine Itching  . Erythromycin Itching  . Sulfa Antibiotics Itching     vomiting  . Tramadol Itching   Objective:  There were no vitals filed for this visit. There is no height or weight on file to calculate BMI. Constitutional Well developed. Well nourished.    Vascular Dorsalis pedis pulses palpable bilaterally. Posterior tibial pulses palpable bilaterally. Capillary refill normal to all digits.  No cyanosis or clubbing noted. Pedal hair growth normal.  Neurologic Normal speech. Oriented to person, place, and time. Epicritic sensation to light touch grossly present bilaterally.  Dermatologic Nails well groomed and normal in appearance. No open wounds. No skin lesions.  Orthopedic:  Pain on palpation to the medial aspect of the interphalangeal joint of the right hallux.  Mild pain with range of motion of the interval phalangeal joint.  No pain with range of motion or palpation of the first metatarsophalangeal joint.  No pain with resisted extensor and flexor range of motion.   Radiographs: 3 views of skeletally mature adult foot: Mild arthritic changes noted at the interphalangeal joint of the right hallux.  No loose osteophytes noted.  No other bony abnormalities noted. Assessment:   1. Osteoarthritis of toe joint, right   2. Capsulitis   3. Pain of right great toe    Plan:  Patient was evaluated and treated and all questions answered.  Right hallux interphalangeal joint arthritis versus capsulitis -I explained to the patient the etiology of her pain secondary to arthritis versus capsulitis.  Given that her pain is not white elicited when taken with range of motion of the interphalangeal as opposed to palpation to the joint I believe that this is likely due to capsulitis.  I believe patient will benefit from a steroid injection in the area to help decrease inflammation.  Patient agrees with the plan and would like to proceed with injection. -I will see her back in 4 weeks to assess how much injection has helped.  If she had greater than 50% improvement I will consider doing another injection to the area.  If no improvement I will consider getting MRI to properly evaluate the hallux.  No follow-ups on file.

## 2019-10-30 ENCOUNTER — Other Ambulatory Visit (HOSPITAL_COMMUNITY): Payer: Self-pay | Admitting: *Deleted

## 2019-10-30 ENCOUNTER — Other Ambulatory Visit: Payer: Self-pay | Admitting: *Deleted

## 2019-10-30 DIAGNOSIS — H209 Unspecified iridocyclitis: Secondary | ICD-10-CM

## 2019-10-30 DIAGNOSIS — D869 Sarcoidosis, unspecified: Secondary | ICD-10-CM

## 2019-10-31 ENCOUNTER — Other Ambulatory Visit: Payer: Self-pay

## 2019-10-31 ENCOUNTER — Other Ambulatory Visit: Payer: Self-pay | Admitting: Rheumatology

## 2019-10-31 ENCOUNTER — Encounter (HOSPITAL_COMMUNITY)
Admission: RE | Admit: 2019-10-31 | Discharge: 2019-10-31 | Disposition: A | Discharge status: Home / Self Care | Payer: Medicare Other | Source: Ambulatory Visit | Attending: Rheumatology | Admitting: Rheumatology

## 2019-10-31 DIAGNOSIS — H209 Unspecified iridocyclitis: Secondary | ICD-10-CM | POA: Insufficient documentation

## 2019-10-31 DIAGNOSIS — D869 Sarcoidosis, unspecified: Secondary | ICD-10-CM | POA: Insufficient documentation

## 2019-10-31 MED ORDER — SODIUM CHLORIDE 0.9 % IV SOLN
3.0000 mg/kg | INTRAVENOUS | Status: DC
  Administered 2019-10-31: 200 mg via INTRAVENOUS
  Filled 2019-10-31: qty 20

## 2019-10-31 MED ORDER — ACETAMINOPHEN 325 MG PO TABS: 650.0000 mg | ORAL_TABLET | ORAL | Status: DC

## 2019-10-31 MED ORDER — DIPHENHYDRAMINE HCL 25 MG PO CAPS: 25.0000 mg | ORAL_CAPSULE | ORAL | Status: DC

## 2019-11-01 NOTE — Telephone Encounter (Signed)
Last Visit:05/02/2019 Next Visit: 11/16/2019  Okay to refill per Dr. Corliss Skains.

## 2019-11-09 NOTE — Progress Notes (Signed)
Office Visit Note  Patient: Renee Jones             Date of Birth: [birth year]           MRN: [id number]             PCP: Doreene Nest, NP Referring: Doreene Nest, NP Visit Date: 11/16/2019 Occupation: @GUAROCC @  Subjective:  Pain in both knee joints   History of Present Illness: Renee Jones is a 69 y.o. female with history of uveitis, sarcoidosis, and DDD.  Patient is on Remicade 3 mg/kg infusions every 6 weeks, methotrexate 10 tablets by mouth once weekly, and folic acid 2 mg by mouth daily.  She states she has been experiencing intermittent aching in the right eye.  She denies any recent uveitis flares but does have an upcoming appointment with her ophthalmologist.  She denies any recent sarcoidosis flares.  She is not having any coughing shortness of breath or new pulmonary symptoms recently.  She states that she has been having increased pain in both hands and both knee joints.  She denies any joint swelling.  She would like to reapply for Visco gel injections for both knee joints.  She is also been experiencing increased lower back pain and left-sided sciatica.  She was seen at pain management had a manipulation yesterday but has not noticed any improvement yet.    Activities of Daily Living:  Patient reports morning stiffness for several  hours.   Patient Reports nocturnal pain.  Difficulty dressing/grooming: Denies Difficulty climbing stairs: Reports Difficulty getting out of chair: Denies Difficulty using hands for taps, buttons, cutlery, and/or writing: Reports  Review of Systems  Constitutional: Positive for fatigue.  HENT: Negative for mouth sores, mouth dryness and nose dryness.   Eyes: Positive for dryness. Negative for pain and visual disturbance.  Respiratory: Negative for cough, hemoptysis, shortness of breath and difficulty breathing.   Cardiovascular: Positive for palpitations. Negative for hypertension.       Scheduled to see her  cardiologist  Gastrointestinal: Negative for blood in stool, constipation and diarrhea.  Endocrine: Negative for increased urination.  Genitourinary: Negative for painful urination.  Musculoskeletal: Positive for arthralgias, joint pain and morning stiffness. Negative for joint swelling, myalgias, muscle weakness, muscle tenderness and myalgias.  Skin: Negative for color change, pallor, rash, hair loss, nodules/bumps, skin tightness, ulcers and sensitivity to sunlight.  Allergic/Immunologic: Negative for susceptible to infections.  Neurological: Negative for numbness, headaches, memory loss and weakness.  Hematological: Negative for bruising/bleeding tendency and swollen glands.  Psychiatric/Behavioral: Negative for depressed mood, confusion and sleep disturbance. The patient is not nervous/anxious.     PMFS History:  Patient Active Problem List   Diagnosis Date Noted  . Prediabetes 12/21/2018  . High risk medications (not anticoagulants) long-term use 01/13/2017  . GERD (gastroesophageal reflux disease) 09/14/2016  . Bronchial spasm 09/14/2016  . Glaucoma 09/14/2016  . Detached retina 09/14/2016  . Osteoarthritis of both hands 09/14/2016  . Osteoarthritis of both knees 09/14/2016  . DDD (degenerative disc disease), lumbar 09/14/2016  . Uveitis 06/22/2016  . SVT (supraventricular tachycardia) (HCC) 08/28/2015  . Essential hypertension 08/28/2015  . Diastolic dysfunction 08/28/2015  . Sarcoidosis 09/30/2012  . Pulmonary nodules 09/30/2012  . Tachycardia 09/30/2012  . Sarcoid arthropathy 09/30/2012    Past Medical History:  Diagnosis Date  . Allergy   . Asthma   . Bronchial spasm 09/14/2016  . DDD (degenerative disc disease), lumbar 09/14/2016  . Detached retina 09/14/2016  .  Diastolic dysfunction 08/28/2015  . Essential hypertension 08/28/2015  . GERD (gastroesophageal reflux disease)   . Glaucoma 09/14/2016  . Neuromuscular disorder (HCC)   . Osteoarthritis of both hands  09/14/2016  . Osteoarthritis of both knees 09/14/2016  . Sarcoidosis    with +lymph node biopsy  . SVT (supraventricular tachycardia) (HCC) 08/28/2015  . Tachycardia   . Uveitis     Family History  Adopted: Yes  Problem Relation Age of Onset  . Scleroderma Daughter    Past Surgical History:  Procedure Laterality Date  . CATARACT EXTRACTION, BILATERAL  2018  . CYSTECTOMY    . EYE SURGERY Bilateral 2018   cataract removal   . LUMBAR SPINE SURGERY  2009  . ROTATOR CUFF REPAIR Right 2011  . ROTATOR CUFF REPAIR Left 06/2018  . TONSILLECTOMY AND ADENOIDECTOMY  1962  . TOTAL ABDOMINAL HYSTERECTOMY  1984   Social History   Social History Narrative   Widower.   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   Immunization History  Administered Date(s) Administered  . DTaP 07/26/2010  . Influenza Split 07/19/2014  . Influenza,inj,Quad PF,6+ Mos 07/19/2016, 12/21/2018  . Pneumococcal Polysaccharide-23 09/19/2012  . Pneumococcal-Unspecified 07/26/2012, 01/18/2015  . Td 03/31/2010  . Varicella 09/19/2012  . Zoster 07/26/2012     Objective: Vital Signs: BP 125/80 (BP Location: Left Arm, Patient Position: Sitting, Cuff Size: Normal)   Pulse 84   Resp 12   Ht 5' 3.5" (1.613 m)   Wt 145 lb 9.6 oz (66 kg)   BMI 25.39 kg/m    Physical Exam Vitals and nursing note reviewed.  Constitutional:      Appearance: She is well-developed.  HENT:     Head: Normocephalic and atraumatic.  Eyes:     Conjunctiva/sclera: Conjunctivae normal.  Cardiovascular:     Rate and Rhythm: Normal rate and regular rhythm.     Heart sounds: Normal heart sounds.  Pulmonary:     Effort: Pulmonary effort is normal.     Breath sounds: Normal breath sounds.  Abdominal:     General: Bowel sounds are normal.     Palpations: Abdomen is soft.  Musculoskeletal:     Cervical back: Normal range of motion.  Lymphadenopathy:     Cervical: No cervical adenopathy.  Skin:    General: Skin is  warm and dry.     Capillary Refill: Capillary refill takes less than 2 seconds.  Neurological:     Mental Status: She is alert and oriented to person, place, and time.  Psychiatric:        Behavior: Behavior normal.      Musculoskeletal Exam: C-spine, thoracic spine, spine good range of motion.  She has left SI joint tenderness.  Shoulder joints, elbow joints, wrist joints, MCPs, PIPs and DIPs good range of motion no synovitis.  She has mild PIP and DIP synovial thickening consistent with osteoarthritis of both hands.  She has tenderness of the right CMC joint.  Hip joints, knee joints, ankle joints, MTPs, PIPs and DIPs good range of motion no synovitis.  No warmth or effusion of bilateral knee joints.  No tenderness or swelling of ankle joints.  CDAI Exam: CDAI Score: -- Patient Global: --; Provider Global: -- Swollen: --; Tender: -- Joint Exam 11/16/2019   No joint exam has been documented for this visit   There is currently no information documented on the homunculus. Go to the Rheumatology activity and complete the homunculus joint exam.  Investigation:  No additional findings.  Imaging: No results found.  Recent Labs: Lab Results  Component Value Date   WBC 7.2 09/19/2019   HGB 14.7 09/19/2019   PLT 349 09/19/2019   NA 141 09/19/2019   K 3.7 09/19/2019   CL 104 09/19/2019   CO2 21 (L) 09/19/2019   GLUCOSE 141 (H) 09/19/2019   BUN 17 09/19/2019   CREATININE 0.94 09/19/2019   BILITOT 0.6 09/19/2019   ALKPHOS 57 09/19/2019   AST 28 09/19/2019   ALT 24 09/19/2019   PROT 7.1 09/19/2019   ALBUMIN 4.3 09/19/2019   CALCIUM 9.5 09/19/2019   GFRAA >60 09/19/2019   QFTBGOLD Negative 06/16/2017   QFTBGOLDPLUS Negative 08/08/2019    Speciality Comments: REMICADE 3 mg/kg x 6 weeks   Procedures:  No procedures performed Allergies: Augmentin [amoxicillin-pot clavulanate], Codeine, Erythromycin, Sulfa antibiotics, and Tramadol   Assessment / Plan:     Visit Diagnoses:  Uveitis: She has been experiencing intermittent discomfort in the right eye.  She describes it as an aching sensation.  She also has intermittent blurry vision in both eyes.  No conjunctival injection was noted today.  She uses eyedrops on a daily basis.  Overall she is clinically been doing well on Remicade IV infusions 3 mg/kg every 6 weeks, methotrexate 10 tablets by mouth once weekly and folic acid 2 mg by mouth daily.  She has not missed any doses recently.  She will continue on this current treatment regimen.  She will be following up with her ophthalmologist in March 2021.  She was advised to notify us if he makes any further recommendations.  She will follow up in our office in 5 months.   Sarcoidosis -She was evaluated by Dr. Akeya Gearing on 02/24/2017 and according to his note there was no evidence of pulmonary sarcoid. CXR 02/24/2017 did not reveal any edema or consolidation.  Interstitium appeared unremarkable.  No evident adenopathy. She has not developed any new new or worsening pulmonary symptoms.  She denies any shortness of breath or coughing at this time.  High risk medications (not anticoagulants) long-term use - Remicade IV infusion 3 mg/kg every 6 weeks, methotrexate 2.5 mg 10 tablets every 7 days, and folic acid 1 mg 2 tablets daily.  CBC and CMP were drawn on 09/19/2019.  TB gold was negative on 08/08/2019.  She was encouraged to receive the COVID-19 vaccine once available to her.  She is apprehensive of potential side effects and is undecided if she is going to proceed with getting the vaccine.  Pulmonary nodules  Primary osteoarthritis of both hands: She has mild PIP and DIP synovial thickening consistent with osteoarthritis of both hands.  She has right CMC joint synovial thickening and tenderness on exam.  She has no synovitis on exam.  She has been having increased discomfort in both hands recently.  Joint protection and muscle strengthening were discussed.  Primary osteoarthritis of  both knees: She is been experiencing increased pain in both knee joints.  She has no mechanical symptoms at this time.  She has good range of motion of both knee joints with no warmth or effusion.  X-rays of both knee joints were obtained today.  She would like to reapply for Visco gel injections for both knees.  Chronic pain of both knees -She has been experiencing increased pain in bilateral knee joints patient is good range of motion of both knees with no warmth or effusion.  X-rays of both knees were updated today.  She has moderate  osteoarthritis and moderate chondromalacia patella in both knees.  We will reapply for Visco gel injections for both knee joints plan: XR KNEE 3 VIEW RIGHT, XR KNEE 3 VIEW LEFT  DDD (degenerative disc disease), lumbar: Chronic pain.  She has been experiencing left SI joint pain and left-sided sciatica.  She was evaluated by pain management yesterday and had a manipulation but has not noticed any improvement in her symptoms.  S/P left rotator cuff repair: She has slightly limited range of motion with some discomfort in the left shoulder.  History of repair of right rotator cuff: She has good ROM with no discomfort.   Other medical conditions are listed as follows:  Gastroesophageal reflux disease without esophagitis  SVT (supraventricular tachycardia) ([facility])  Essential hypertension   Orders: Orders Placed This Encounter  Procedures  . XR KNEE 3 VIEW RIGHT  . XR KNEE 3 VIEW LEFT   No orders of the defined types were placed in this encounter.   Face-to-face time spent with patient was 30 minutes. Greater than 50% of time was spent in counseling and coordination of care.  Follow-Up Instructions: Return in 5 months (on 04/15/2020) for Uveitis , Sarcoidosis, Osteoarthritis.   Ofilia Neas, PA-C   I examined and evaluated the patient with Renee Sams PA.  Patient continues to have some irritation in her eye.  She has an appointment coming up with the  ophthalmologist.  She had no evidence of inflammatory arthritis today on my exam.  She continues to have some discomfort from underlying osteoarthritis.  The plan of care was discussed as noted above.  Bo Merino, MD  Note - This record has been created using Editor, commissioning.  Chart creation errors have been sought, but may not always  have been located. Such creation errors do not reflect on  the standard of medical care.

## 2019-11-13 ENCOUNTER — Ambulatory Visit: Payer: Medicare Other | Admitting: Podiatry

## 2019-11-13 ENCOUNTER — Other Ambulatory Visit: Payer: Self-pay

## 2019-11-15 DIAGNOSIS — M5432 Sciatica, left side: Secondary | ICD-10-CM | POA: Diagnosis not present

## 2019-11-15 DIAGNOSIS — M9903 Segmental and somatic dysfunction of lumbar region: Secondary | ICD-10-CM | POA: Diagnosis not present

## 2019-11-15 DIAGNOSIS — M62830 Muscle spasm of back: Secondary | ICD-10-CM | POA: Diagnosis not present

## 2019-11-15 DIAGNOSIS — M545 Low back pain: Secondary | ICD-10-CM | POA: Diagnosis not present

## 2019-11-16 ENCOUNTER — Encounter: Payer: Self-pay | Admitting: Rheumatology

## 2019-11-16 ENCOUNTER — Other Ambulatory Visit: Payer: Self-pay

## 2019-11-16 ENCOUNTER — Ambulatory Visit (INDEPENDENT_AMBULATORY_CARE_PROVIDER_SITE_OTHER): Payer: Medicare Other

## 2019-11-16 ENCOUNTER — Telehealth: Payer: Self-pay

## 2019-11-16 ENCOUNTER — Ambulatory Visit: Payer: Self-pay

## 2019-11-16 ENCOUNTER — Ambulatory Visit (INDEPENDENT_AMBULATORY_CARE_PROVIDER_SITE_OTHER): Payer: Medicare Other | Admitting: Rheumatology

## 2019-11-16 VITALS — BP 125/80 | HR 84 | Resp 12 | Ht 63.5 in | Wt 145.6 lb

## 2019-11-16 DIAGNOSIS — M170 Bilateral primary osteoarthritis of knee: Secondary | ICD-10-CM

## 2019-11-16 DIAGNOSIS — R918 Other nonspecific abnormal finding of lung field: Secondary | ICD-10-CM

## 2019-11-16 DIAGNOSIS — M19042 Primary osteoarthritis, left hand: Secondary | ICD-10-CM

## 2019-11-16 DIAGNOSIS — M19041 Primary osteoarthritis, right hand: Secondary | ICD-10-CM | POA: Diagnosis not present

## 2019-11-16 DIAGNOSIS — M5136 Other intervertebral disc degeneration, lumbar region: Secondary | ICD-10-CM | POA: Diagnosis not present

## 2019-11-16 DIAGNOSIS — I471 Supraventricular tachycardia, unspecified: Secondary | ICD-10-CM

## 2019-11-16 DIAGNOSIS — Z79899 Other long term (current) drug therapy: Secondary | ICD-10-CM | POA: Diagnosis not present

## 2019-11-16 DIAGNOSIS — D869 Sarcoidosis, unspecified: Secondary | ICD-10-CM

## 2019-11-16 DIAGNOSIS — M25562 Pain in left knee: Secondary | ICD-10-CM | POA: Diagnosis not present

## 2019-11-16 DIAGNOSIS — G8929 Other chronic pain: Secondary | ICD-10-CM

## 2019-11-16 DIAGNOSIS — I10 Essential (primary) hypertension: Secondary | ICD-10-CM

## 2019-11-16 DIAGNOSIS — H209 Unspecified iridocyclitis: Secondary | ICD-10-CM | POA: Diagnosis not present

## 2019-11-16 DIAGNOSIS — M25561 Pain in right knee: Secondary | ICD-10-CM

## 2019-11-16 DIAGNOSIS — Z9889 Other specified postprocedural states: Secondary | ICD-10-CM | POA: Diagnosis not present

## 2019-11-16 DIAGNOSIS — K219 Gastro-esophageal reflux disease without esophagitis: Secondary | ICD-10-CM | POA: Diagnosis not present

## 2019-11-16 DIAGNOSIS — M51369 Other intervertebral disc degeneration, lumbar region without mention of lumbar back pain or lower extremity pain: Secondary | ICD-10-CM

## 2019-11-16 NOTE — Telephone Encounter (Signed)
Please apply for bilateral knee visco, per Taylor Dale, PA-C. Thanks!  

## 2019-11-17 DIAGNOSIS — M545 Low back pain: Secondary | ICD-10-CM | POA: Diagnosis not present

## 2019-11-17 DIAGNOSIS — M9903 Segmental and somatic dysfunction of lumbar region: Secondary | ICD-10-CM | POA: Diagnosis not present

## 2019-11-17 DIAGNOSIS — M62830 Muscle spasm of back: Secondary | ICD-10-CM | POA: Diagnosis not present

## 2019-11-17 DIAGNOSIS — M5432 Sciatica, left side: Secondary | ICD-10-CM | POA: Diagnosis not present

## 2019-11-21 ENCOUNTER — Encounter: Payer: Self-pay | Admitting: Podiatry

## 2019-11-21 ENCOUNTER — Ambulatory Visit (INDEPENDENT_AMBULATORY_CARE_PROVIDER_SITE_OTHER): Payer: Medicare Other | Admitting: Podiatry

## 2019-11-21 ENCOUNTER — Other Ambulatory Visit: Payer: Self-pay | Admitting: *Deleted

## 2019-11-21 ENCOUNTER — Other Ambulatory Visit: Payer: Self-pay

## 2019-11-21 DIAGNOSIS — M79674 Pain in right toe(s): Secondary | ICD-10-CM

## 2019-11-21 DIAGNOSIS — M19071 Primary osteoarthritis, right ankle and foot: Secondary | ICD-10-CM | POA: Diagnosis not present

## 2019-11-21 DIAGNOSIS — M779 Enthesopathy, unspecified: Secondary | ICD-10-CM

## 2019-11-21 NOTE — Progress Notes (Signed)
Infusion orders are current for patient CBC CMP Tylenol Benadryl appointments are up to date and follow up appointment  is scheduled TB gold not due yet.  

## 2019-11-21 NOTE — Progress Notes (Signed)
Subjective:  Patient ID: Renee Jones, female    DOB: [birth year],  MRN: [id number]  Chief Complaint  Patient presents with  . Foot Pain    pt is here for a f/u of the right hallux joint, pt no longer has any pain, injection she has recieved helped, pt also states that she is able to wear shoes without feeling any pain. Pt has no other comments or concerns    69 y.o. female presents with the above complaint.  Patient presents with follow-up from a right hallux interphalangeal joint arthritis versus capsulitis.  Patient states the injection helped tremendously.  She does not have any pain anymore.  She has been able to ambulate without any pain.  She has been able to ambulate with regular sneakers.  She denies any other acute complaints.  Review of Systems: Negative except as noted in the HPI. Denies N/V/F/Ch.  Past Medical History:  Diagnosis Date  . Allergy   . Asthma   . Bronchial spasm 09/14/2016  . DDD (degenerative disc disease), lumbar 09/14/2016  . Detached retina 09/14/2016  . Diastolic dysfunction 57/05/4695  . Essential hypertension 08/28/2015  . GERD (gastroesophageal reflux disease)   . Glaucoma 09/14/2016  . Neuromuscular disorder ([facility])   . Osteoarthritis of both hands 09/14/2016  . Osteoarthritis of both knees 09/14/2016  . Sarcoidosis    with +lymph node biopsy  . SVT (supraventricular tachycardia) ([facility]) 08/28/2015  . Tachycardia   . Uveitis     Current Outpatient Medications:  .  albuterol (PROVENTIL HFA;VENTOLIN HFA) 108 (90 BASE) MCG/ACT inhaler, Inhale 2 puffs into the lungs every 6 (six) hours as needed for wheezing or shortness of breath., Disp: , Rfl:  .  amLODipine (NORVASC) 5 MG tablet, TAKE 1 TABLET(5 MG) BY MOUTH DAILY FOR BLOOD PRESSURE, Disp: 90 tablet, Rfl: 1 .  cholecalciferol (VITAMIN D) 1000 units tablet, Take 1,000 Units by mouth daily., Disp: , Rfl:  .  cyclobenzaprine (FLEXERIL) 10 MG tablet, TK 1 T PO Q 8 H PRF SPASM, Disp: , Rfl:  .   diclofenac sodium (VOLTAREN) 1 % GEL, Apply three grams to three large joints up to three times daily. (Patient taking differently: Apply three grams to three large joints up to three times daily as needed), Disp: 3 Tube, Rfl: 3 .  fluticasone (FLONASE) 50 MCG/ACT nasal spray, Place 2 sprays into both nostrils daily as needed for allergies or rhinitis., Disp: , Rfl:  .  folic acid (FOLVITE) 1 MG tablet, Take 2 tablets by mouth once daily, Disp: 180 tablet, Rfl: 0 .  hydrochlorothiazide (MICROZIDE) 12.5 MG capsule, Take 12.5 mg by mouth daily., Disp: , Rfl:  .  InFLIXimab (REMICADE IV), Inject into the vein every 6 (six) weeks., Disp: , Rfl:  .  methotrexate 2.5 MG tablet, TAKE 10 TABLETS BY MOUTH ONCE A WEEK, Disp: 120 tablet, Rfl: 0 .  Multiple Vitamin (MULTIVITAMIN WITH MINERALS) TABS tablet, Take 1 tablet by mouth daily., Disp: , Rfl:  .  naproxen sodium (ALEVE) 220 MG tablet, Take 220 mg by mouth as needed., Disp: , Rfl:  .  ondansetron (ZOFRAN) 4 MG tablet, TAKE 1 TABLET BY MOUTH EVERY 6 HOURS AS NEEDED FOR NAUSEA AND VOMITING, Disp: 30 tablet, Rfl: 0 .  pantoprazole (PROTONIX) 40 MG tablet, Take 1 tablet (40 mg total) by mouth daily. For heartburn., Disp: 90 tablet, Rfl: 3  Social History   Tobacco Use  Smoking Status Never Smoker  Smokeless Tobacco Never Used  Allergies  Allergen Reactions  . Augmentin [Amoxicillin-Pot Clavulanate]   . Codeine Itching  . Erythromycin Itching  . Sulfa Antibiotics Itching     vomiting  . Tramadol Itching   Objective:  There were no vitals filed for this visit. There is no height or weight on file to calculate BMI. Constitutional Well developed. Well nourished.  Vascular Dorsalis pedis pulses palpable bilaterally. Posterior tibial pulses palpable bilaterally. Capillary refill normal to all digits.  No cyanosis or clubbing noted. Pedal hair growth normal.  Neurologic Normal speech. Oriented to person, place, and time. Epicritic sensation  to light touch grossly present bilaterally.  Dermatologic Nails well groomed and normal in appearance. No open wounds. No skin lesions.  Orthopedic:  No pain on palpation to the medial aspect of the interphalangeal joint of the right hallux.  No pain with range of motion of the interval phalangeal joint.  No pain with range of motion or palpation of the first metatarsophalangeal joint.  No pain with resisted extensor and flexor range of motion.   Radiographs: None Assessment:   1. Osteoarthritis of toe joint, right   2. Capsulitis   3. Pain of right great toe    Plan:  Patient was evaluated and treated and all questions answered.  Right hallux interphalangeal joint arthritis versus capsulitis -Resolved.  It appears that the injection that I gave her has completely resolved her pain and has allowed her to ambulate without any gait abnormalities.  I have asked her that the pain may come back in the future if it does patient may benefit from another steroid injection to help decrease the inflammatory component of the pain.  Patient states understanding and she will follow back up as needed for any foot and ankle issues.  No follow-ups on file.

## 2019-11-24 DIAGNOSIS — Z20828 Contact with and (suspected) exposure to other viral communicable diseases: Secondary | ICD-10-CM | POA: Diagnosis not present

## 2019-11-29 DIAGNOSIS — M62830 Muscle spasm of back: Secondary | ICD-10-CM | POA: Diagnosis not present

## 2019-11-29 DIAGNOSIS — M9903 Segmental and somatic dysfunction of lumbar region: Secondary | ICD-10-CM | POA: Diagnosis not present

## 2019-11-29 DIAGNOSIS — M5432 Sciatica, left side: Secondary | ICD-10-CM | POA: Diagnosis not present

## 2019-11-29 DIAGNOSIS — M545 Low back pain: Secondary | ICD-10-CM | POA: Diagnosis not present

## 2019-11-30 ENCOUNTER — Ambulatory Visit (INDEPENDENT_AMBULATORY_CARE_PROVIDER_SITE_OTHER): Payer: Medicare Other | Admitting: Cardiovascular Disease

## 2019-11-30 ENCOUNTER — Encounter: Payer: Self-pay | Admitting: Cardiovascular Disease

## 2019-11-30 ENCOUNTER — Other Ambulatory Visit: Payer: Self-pay

## 2019-11-30 VITALS — BP 124/78 | HR 85 | Temp 95.6°F | Ht 63.0 in | Wt 142.0 lb

## 2019-11-30 DIAGNOSIS — D869 Sarcoidosis, unspecified: Secondary | ICD-10-CM | POA: Diagnosis not present

## 2019-11-30 DIAGNOSIS — I10 Essential (primary) hypertension: Secondary | ICD-10-CM | POA: Diagnosis not present

## 2019-11-30 DIAGNOSIS — I471 Supraventricular tachycardia: Secondary | ICD-10-CM | POA: Diagnosis not present

## 2019-11-30 MED ORDER — METOPROLOL TARTRATE 50 MG PO TABS: ORAL_TABLET | ORAL | 3 refills | Status: DC

## 2019-11-30 NOTE — Patient Instructions (Signed)
Medication Instructions:  STOP HYDROCHLOROTHIAZIDE   START METOPROLOL 50 MG (1 AND 1/2 TABLETS TO EQUAL 75 MG) TWICE A DAY   *If you need a refill on your cardiac medications before your next appointment, please call your pharmacy*  Lab Work: NONE  Testing/Procedures: NONE  Follow-Up: At [facility], you and your health needs are our priority.  As part of our continuing mission to provide you with exceptional heart care, we have created designated Provider Care Teams.  These Care Teams include your primary Cardiologist (physician) and Advanced Practice Providers (APPs -  Physician Assistants and Nurse Practitioners) who all work together to provide you with the care you need, when you need it.  Your next appointment:   2 month(s)  The format for your next appointment:   In Person  Provider:   DR [facility] OR PA/NP

## 2019-11-30 NOTE — Progress Notes (Signed)
Cardiology Office Note   Date:  11/30/2019   ID:  Areej Tayler, DOB Jun 13, 1951, MRN [id number]  PCP:  Doreene Nest, NP  Cardiologist:   Chilton Si, MD  EP: Loman Brooklyn, MD   No chief complaint on file.   Patient ID:   Nasia Cannan is a 69 y.o. female paroxysmal SVT, HTN, GERD, and sarcoidosis.    History of Present Illness: [name] reports episodes of SVT that have been occurring for as long as she can remember.  She previously took a beta blocker which helped her symptoms but stopped taking it 20 years ago because she didn't think she needed it anymore.  [name] was previously a patient of Dr. Michaelle Copas and had a Holter monitor documenting SVT.  [name] was evaluated in the ED 02/2015 with elevated BP to 197/120.   She had a cardiac MRI 10/2012 that did not reveal any delayed enhancement concerning for cardiac involvement of her sarcoid.  She underwent CPX testing to evaluate shortness of breath and it was suggestive of diastolic dysfunction.  She was started on metoprolol for SVT and it also helped her shortness of breath.  She was referred for an echo 08/2015 that revealed LVEF 55-60% with grade 1 diastolic dysfunction. She saw Dr. Elberta Fortis and has not been interested in ablation. Metoprolol was increased with an improvement in her symptoms.  Since her last appointment Ms. Eveline Sauve has noticed increased palpitations.  She is having symptoms almost daily, sometimes multiple times in the day.  When it occurs she feels like her heart is vibrating.  She also has a squeezing sensation in her chest that radiates to her back.  Sometimes it alarms her and she feels like she needs to take an aspirin.  She has tried vagal maneuvers without much success.  She walks for exercise a couple days per week.  She has no exertional chest pain but sometimes does get short of breath.  She denies lower extremity edema, orthopnea, or PND.  She notices that sometimes getting in  the shower seems to trigger her palpitations and makes her feel exhausted when she gets out.  Of note, her PCP recommended that she stop HCTZ 06/2019 due to hypokalemia.  She misunderstood and discontinued HCTZ but stopped metoprolol.  At home her blood pressure has been ranging 107-128/60-80s.  At times when she goes to the infusion center her blood pressure gets low.  [name]'s husband died in 2018-02-15.  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   Past Medical History:  Diagnosis Date  . Allergy   . Asthma   . Bronchial spasm 09/14/2016  . DDD (degenerative disc disease), lumbar 09/14/2016  . Detached retina 09/14/2016  . Diastolic dysfunction 08/28/2015  . Essential hypertension 08/28/2015  . GERD (gastroesophageal reflux disease)   . Glaucoma 09/14/2016  . Neuromuscular disorder (HCC)   . Osteoarthritis of both hands 09/14/2016  . Osteoarthritis of both knees 09/14/2016  . Sarcoidosis    with +lymph node biopsy  . SVT (supraventricular tachycardia) (HCC) 08/28/2015  . Tachycardia   . Uveitis     Past Surgical History:  Procedure Laterality Date  . CATARACT EXTRACTION, BILATERAL  Feb 15, 2017  . CYSTECTOMY    . EYE SURGERY Bilateral 02/15/2017   cataract removal   . LUMBAR SPINE SURGERY  02/16/2008  . ROTATOR CUFF REPAIR Right 2010/02/15  .  ROTATOR CUFF REPAIR Left 06/2018  . TONSILLECTOMY AND ADENOIDECTOMY  1962  . TOTAL ABDOMINAL HYSTERECTOMY  1984     Current Outpatient Medications  Medication Sig Dispense Refill  . albuterol (PROVENTIL HFA;VENTOLIN HFA) 108 (90 BASE) MCG/ACT inhaler Inhale 2 puffs into the lungs every 6 (six) hours as needed for wheezing or shortness of breath.    Marland Kitchen amLODipine (NORVASC) 5 MG tablet TAKE 1 TABLET(5 MG) BY MOUTH DAILY FOR BLOOD PRESSURE 90 tablet 1  . cholecalciferol (VITAMIN D) 1000 units tablet Take 1,000 Units by mouth daily.    . cyclobenzaprine (FLEXERIL) 10 MG tablet TK 1  T PO Q 8 H PRF SPASM    . diclofenac sodium (VOLTAREN) 1 % GEL Apply three grams to three large joints up to three times daily. (Patient taking differently: Apply three grams to three large joints up to three times daily as needed) 3 Tube 3  . fluticasone (FLONASE) 50 MCG/ACT nasal spray Place 2 sprays into both nostrils daily as needed for allergies or rhinitis.    . folic acid (FOLVITE) 1 MG tablet Take 2 tablets by mouth once daily 180 tablet 0  . InFLIXimab (REMICADE IV) Inject into the vein every 6 (six) weeks.    . methotrexate 2.5 MG tablet TAKE 10 TABLETS BY MOUTH ONCE A WEEK 120 tablet 0  . Multiple Vitamin (MULTIVITAMIN WITH MINERALS) TABS tablet Take 1 tablet by mouth daily.    . naproxen sodium (ALEVE) 220 MG tablet Take 220 mg by mouth as needed.    . ondansetron (ZOFRAN) 4 MG tablet TAKE 1 TABLET BY MOUTH EVERY 6 HOURS AS NEEDED FOR NAUSEA AND VOMITING 30 tablet 0  . pantoprazole (PROTONIX) 40 MG tablet Take 1 tablet (40 mg total) by mouth daily. For heartburn. 90 tablet 3  . metoprolol tartrate (LOPRESSOR) 50 MG tablet TAKE 1 AND 1/2 TABLETS BY MOUTH TWICE A DAY 270 tablet 3   No current facility-administered medications for this visit.    Allergies:   Augmentin [amoxicillin-pot clavulanate], Codeine, Erythromycin, Sulfa antibiotics, and Tramadol    Social History:  The patient  reports that she has never smoked. She has never used smokeless tobacco. She reports that she does not drink alcohol or use drugs.   Family History:  The patient's family history includes Scleroderma in her daughter. She was adopted.    ROS:  Please see the history of present illness.   Otherwise, review of systems are positive for none.   All other systems are reviewed and negative.    PHYSICAL EXAM: VS:  BP 124/78   Pulse 85   Temp (!) 95.6 F (35.3 C)   Ht 5\' 3"  (1.6 m)   Wt 142 lb (64.4 kg)   SpO2 99%   BMI 25.15 kg/m  , BMI Body mass index is 25.15 kg/m. GENERAL:  Well  appearing HEENT:  Pupils equal round and reactive, fundi not visualized, oral mucosa unremarkable NECK:  No jugular venous distention, waveform within normal limits, carotid upstroke brisk and symmetric, no bruits LUNGS:  Clear to auscultation bilaterally CHEST:  Unremarkable HEART:  PMI not displaced or sustained,S1 and S2 within normal limits, no S3, no S4, no clicks, no rubs, no murmurs ABD:  Flat, positive bowel sounds normal in frequency in pitch, no bruits, no rebound, no guarding, no midline pulsatile mass, no hepatomegaly, no splenomegaly EXT:  2 plus pulses throughout, no edema, no cyanosis no clubbing SKIN:  No rashes no nodules NEURO:  Cranial nerves  II through XII grossly intact, motor grossly intact throughout [facility]:  Cognitively intact, oriented to person place and time  EKG:  EKG is ordered today. 05/04/16:Sinus rhythm.  Rate 74 bpm.  Inferior TWI. 11/18/16: Sinus rhythm. Rate 72 bpm. We will monitor 11/30/19: Sinus rhythm.  85 bpm.  Nonspecific T wave abnormalities  Recent Labs: 09/19/2019: ALT 24; BUN 17; Creatinine, Ser 0.94; Hemoglobin 14.7; Platelets 349; Potassium 3.7; Sodium 141    Lipid Panel    Component Value Date/Time   CHOL 190 12/21/2018 1018   TRIG 171.0 (H) 12/21/2018 1018   HDL 47.90 12/21/2018 1018   CHOLHDL 4 12/21/2018 1018   VLDL 34.2 12/21/2018 1018   LDLCALC 108 (H) 12/21/2018 1018      Wt Readings from Last 3 Encounters:  11/30/19 142 lb (64.4 kg)  11/16/19 145 lb 9.6 oz (66 kg)  10/31/19 140 lb (63.5 kg)      Other studies Reviewed: Additional studies/ records that were reviewed today include: PCP note  Review of the above records demonstrates:  Please see elsewhere in the note.     CPX 05/01/15: Conclusion: Exercise testing with gas-exchange demonstrates a mild functional impairment when compared to matched sedentary norms. There is no significant evidence of EIB. The combination of reduced PVO2, hypertensive BP, elevated VE/VCO2  slope, and flat O2 pulse suggests possible circulatory limitations (specifically diastolic dysfunction).   2014 CMRI 1. Normal LV size and systolic function, EF 64%.  2. Normal RV size and systolic function.  3. No myocardial delayed enhancement, so no definitive evidence for prior MI, infiltrative disease, or myocarditis.  4. No evidence on this exam for cardiac sarcoidosis.   ASSESSMENT AND PLAN:  # SVT: Ms. Graceyn Fodor has been having many episodes of what sounds like SVT lately.  This is likely because she inadvertently stopped metoprolol.  She will resume her prior dose of 75 mg twice daily.  If she continues to have episodes we will refer her back to Dr. Curt Bears.   # Diastolic dysfunction: CPX was suggestive of diastolic dysfunction and her symptoms have improved with metoprolol.  However, her echo only showed grade 1 diastolic function and she does not clinically appear to have heart failure. Continue metoprolol and losartan.  She not have shortness of breath, but I suspect it is because metoprolol was discontinued.  We will reevaluate at follow-up.  # Hypertension: BP is well-controlled but sometimes getting too low.  Stop HCTZ as recommended by her PCP.  Continue amlodipine and restart metoprolol as above.  # Sarcoidosis: No evidence of cardiac involvement on MRI 10/2012.  # Chest pain: Resolved with treatment of GERD   Current medicines are reviewed at length with the patient today.  The patient does not have concerns regarding medicines.  The following changes have been made:  Increase metoprolol to 75 mg bid.  Labs/ tests ordered today include: none.  Orders Placed This Encounter  Procedures  . EKG 12-Lead     Disposition:   FU with Dr. Jonelle Sidle C. [place] in 2 months    Signed, Skeet Latch, MD  11/30/2019 10:10 AM    [place]

## 2019-12-01 DIAGNOSIS — M9903 Segmental and somatic dysfunction of lumbar region: Secondary | ICD-10-CM | POA: Diagnosis not present

## 2019-12-01 DIAGNOSIS — M545 Low back pain: Secondary | ICD-10-CM | POA: Diagnosis not present

## 2019-12-01 DIAGNOSIS — M62830 Muscle spasm of back: Secondary | ICD-10-CM | POA: Diagnosis not present

## 2019-12-01 DIAGNOSIS — M5432 Sciatica, left side: Secondary | ICD-10-CM | POA: Diagnosis not present

## 2019-12-05 NOTE — Telephone Encounter (Signed)
Please call, and schedule Visco Knee Injections.  Schedule Orthovisc Bilateral Knees. Buy and Bill. Deductible applies, but has been met. No PA required.  No Co- Pay. Insurance with secondary to cover 100%.

## 2019-12-06 NOTE — Telephone Encounter (Signed)
I LMOM for patient to call, and schedule Visco knee injections. 

## 2019-12-12 ENCOUNTER — Ambulatory Visit (HOSPITAL_COMMUNITY)
Admission: RE | Admit: 2019-12-12 | Discharge: 2019-12-12 | Disposition: A | Discharge status: Home / Self Care | Payer: Medicare Other | Source: Ambulatory Visit | Attending: Rheumatology | Admitting: Rheumatology

## 2019-12-12 ENCOUNTER — Other Ambulatory Visit: Payer: Self-pay

## 2019-12-12 DIAGNOSIS — H209 Unspecified iridocyclitis: Secondary | ICD-10-CM | POA: Insufficient documentation

## 2019-12-12 DIAGNOSIS — D869 Sarcoidosis, unspecified: Secondary | ICD-10-CM | POA: Insufficient documentation

## 2019-12-12 LAB — COMPREHENSIVE METABOLIC PANEL
ALT: 20 U/L (ref 0–44)
AST: 25 U/L (ref 15–41)
Albumin: 3.9 g/dL (ref 3.5–5.0)
Alkaline Phosphatase: 54 U/L (ref 38–126)
Anion gap: 11 (ref 5–15)
BUN: 12 mg/dL (ref 8–23)
CO2: 22 mmol/L (ref 22–32)
Calcium: 9 mg/dL (ref 8.9–10.3)
Chloride: 107 mmol/L (ref 98–111)
Creatinine, Ser: 0.93 mg/dL (ref 0.44–1.00)
GFR calc Af Amer: >60 mL/min (ref >60)
GFR calc non Af Amer: >60 mL/min (ref >60)
Glucose, Bld: 125 mg/dL — ABNORMAL HIGH (ref 70–99)
Potassium: 4.2 mmol/L (ref 3.5–5.1)
Sodium: 140 mmol/L (ref 135–145)
Total Bilirubin: 0.7 mg/dL (ref 0.3–1.2)
Total Protein: 6.5 g/dL (ref 6.5–8.1)

## 2019-12-12 LAB — CBC
HCT: 41.6 % (ref 36.0–46.0)
Hemoglobin: 13.9 g/dL (ref 12.0–15.0)
MCH: 30.5 pg (ref 26.0–34.0)
MCHC: 33.4 g/dL (ref 30.0–36.0)
MCV: 91.2 fL (ref 80.0–100.0)
Platelets: 335 10*3/uL (ref 150–400)
RBC: 4.56 MIL/uL (ref 3.87–5.11)
RDW: 13.6 % (ref 11.5–15.5)
WBC: 6.2 10*3/uL (ref 4.0–10.5)
nRBC: 0 % (ref 0.0–0.2)

## 2019-12-12 MED ORDER — SODIUM CHLORIDE 0.9 % IV SOLN
3.0000 mg/kg | INTRAVENOUS | Status: DC
  Administered 2019-12-12: 12:00:00 200 mg via INTRAVENOUS
  Filled 2019-12-12: qty 20

## 2019-12-12 MED ORDER — DIPHENHYDRAMINE HCL 25 MG PO CAPS: 25.0000 mg | ORAL_CAPSULE | ORAL | Status: DC

## 2019-12-12 MED ORDER — ACETAMINOPHEN 325 MG PO TABS: 650.0000 mg | ORAL_TABLET | ORAL | Status: DC

## 2019-12-12 NOTE — Progress Notes (Signed)
CBC is normal.

## 2019-12-21 ENCOUNTER — Other Ambulatory Visit: Payer: Self-pay | Admitting: Rheumatology

## 2019-12-21 NOTE — Telephone Encounter (Signed)
Last Visit: 11/16/19 Next Visit: 04/16/20 Labs: 12/12/19 glucose is elevated-125. Rest of CMP WNL.  Okay to refill per Dr. Corliss Skains

## 2020-01-01 ENCOUNTER — Other Ambulatory Visit: Payer: Self-pay

## 2020-01-01 ENCOUNTER — Ambulatory Visit (INDEPENDENT_AMBULATORY_CARE_PROVIDER_SITE_OTHER): Payer: Medicare Other | Admitting: Physician Assistant

## 2020-01-01 DIAGNOSIS — M170 Bilateral primary osteoarthritis of knee: Secondary | ICD-10-CM | POA: Diagnosis not present

## 2020-01-01 NOTE — Progress Notes (Signed)
   Procedure Note  Patient: Renee Jones             Date of Birth: Apr 13, 1951           MRN: [id number]             Visit Date: 01/01/2020  Procedures: Visit Diagnoses:  1. Primary osteoarthritis of both knees    Orthovisc #1 Bilateral knee joint injections   NDC: [address]-03 Lot #: [id number] Expiration: 08/18/2021  Large Joint Inj: bilateral knee on 01/01/2020 12:09 PM Indications: pain Details: 25 G 1.5 in needle, medial approach  Arthrogram: No  Medications (Right): 1.5 mL lidocaine 1 %; 30 mg Hyaluronan 30 MG/2ML Medications (Left): 1.5 mL lidocaine 1 %; 30 mg Hyaluronan 30 MG/2ML Outcome: tolerated well, no immediate complications Procedure, treatment alternatives, risks and benefits explained, specific risks discussed. Consent was given by the patient. Immediately prior to procedure a time out was called to verify the correct patient, procedure, equipment, support staff and site/side marked as required. Patient was prepped and draped in the usual sterile fashion.      Patient tolerated the procedure well.  Sherron Ales, PA-C

## 2020-01-08 ENCOUNTER — Other Ambulatory Visit: Payer: Self-pay

## 2020-01-08 ENCOUNTER — Ambulatory Visit (INDEPENDENT_AMBULATORY_CARE_PROVIDER_SITE_OTHER): Payer: Medicare Other | Admitting: Physician Assistant

## 2020-01-08 DIAGNOSIS — M170 Bilateral primary osteoarthritis of knee: Secondary | ICD-10-CM | POA: Diagnosis not present

## 2020-01-08 MED ORDER — LIDOCAINE HCL 1 % IJ SOLN
1.5000 mL | INTRAMUSCULAR | Status: AC | PRN
  Administered 2020-01-08: 1.5 mL

## 2020-01-08 MED ORDER — HYALURONAN 30 MG/2ML IX SOSY
30.0000 mg | PREFILLED_SYRINGE | INTRA_ARTICULAR | Status: AC | PRN
  Administered 2020-01-08: 30 mg via INTRA_ARTICULAR

## 2020-01-08 NOTE — Progress Notes (Signed)
   Procedure Note  Patient: Renee Jones             Date of Birth: October 12, 1951           MRN: [id number]             Visit Date: 01/08/2020  Procedures: Visit Diagnoses:  1. Primary osteoarthritis of both knees     Orthovisc #2 bilateral knees, B/B Large Joint Inj: bilateral knee on 01/08/2020 9:15 AM Indications: pain Details: 25 G 1.5 in needle, medial approach  Arthrogram: No  Medications (Right): 1.5 mL lidocaine 1 %; 30 mg Hyaluronan 30 MG/2ML Aspirate (Right): 0 mL Medications (Left): 1.5 mL lidocaine 1 %; 30 mg Hyaluronan 30 MG/2ML Aspirate (Left): 0 mL Outcome: tolerated well, no immediate complications Procedure, treatment alternatives, risks and benefits explained, specific risks discussed. Consent was given by the patient. Immediately prior to procedure a time out was called to verify the correct patient, procedure, equipment, support staff and site/side marked as required. Patient was prepped and draped in the usual sterile fashion.     Patient tolerated the procedure well.  Aftercare was discussed.   Sherron Ales, PA-C

## 2020-01-09 ENCOUNTER — Other Ambulatory Visit: Payer: Self-pay | Admitting: Primary Care

## 2020-01-09 DIAGNOSIS — K219 Gastro-esophageal reflux disease without esophagitis: Secondary | ICD-10-CM

## 2020-01-12 DIAGNOSIS — M5432 Sciatica, left side: Secondary | ICD-10-CM | POA: Diagnosis not present

## 2020-01-12 DIAGNOSIS — M545 Low back pain: Secondary | ICD-10-CM | POA: Diagnosis not present

## 2020-01-12 DIAGNOSIS — M9903 Segmental and somatic dysfunction of lumbar region: Secondary | ICD-10-CM | POA: Diagnosis not present

## 2020-01-12 DIAGNOSIS — M62830 Muscle spasm of back: Secondary | ICD-10-CM | POA: Diagnosis not present

## 2020-01-15 ENCOUNTER — Ambulatory Visit (INDEPENDENT_AMBULATORY_CARE_PROVIDER_SITE_OTHER): Payer: Medicare Other | Admitting: Physician Assistant

## 2020-01-15 ENCOUNTER — Other Ambulatory Visit: Payer: Self-pay

## 2020-01-15 DIAGNOSIS — M170 Bilateral primary osteoarthritis of knee: Secondary | ICD-10-CM | POA: Diagnosis not present

## 2020-01-15 MED ORDER — HYALURONAN 30 MG/2ML IX SOSY
30.0000 mg | PREFILLED_SYRINGE | INTRA_ARTICULAR | Status: AC | PRN
  Administered 2020-01-15: 30 mg via INTRA_ARTICULAR

## 2020-01-15 MED ORDER — LIDOCAINE HCL 1 % IJ SOLN
1.5000 mL | INTRAMUSCULAR | Status: AC | PRN
  Administered 2020-01-15: 1.5 mL

## 2020-01-15 NOTE — Progress Notes (Signed)
   Procedure Note  Patient: Renee Jones             Date of Birth: September 02, 1951           MRN: [id number]             Visit Date: 01/15/2020  Procedures: Visit Diagnoses:  1. Primary osteoarthritis of both knees     Orthovisc #3 bilateral B/B Large Joint Inj: bilateral knee on 01/15/2020 9:22 AM Indications: pain Details: 25 G 1.5 in needle, medial approach  Arthrogram: No  Medications (Right): 1.5 mL lidocaine 1 %; 30 mg Hyaluronan 30 MG/2ML Aspirate (Right): 0 mL Medications (Left): 1.5 mL lidocaine 1 %; 30 mg Hyaluronan 30 MG/2ML Aspirate (Left): 0 mL Outcome: tolerated well, no immediate complications Procedure, treatment alternatives, risks and benefits explained, specific risks discussed. Consent was given by the patient. Immediately prior to procedure a time out was called to verify the correct patient, procedure, equipment, support staff and site/side marked as required. Patient was prepped and draped in the usual sterile fashion.     Patient tolerated the procedure well. Aftercare was discussed.  Sherron Ales, PA-C

## 2020-01-17 DIAGNOSIS — H40013 Open angle with borderline findings, low risk, bilateral: Secondary | ICD-10-CM | POA: Diagnosis not present

## 2020-01-17 DIAGNOSIS — H43813 Vitreous degeneration, bilateral: Secondary | ICD-10-CM | POA: Diagnosis not present

## 2020-01-17 DIAGNOSIS — H11823 Conjunctivochalasis, bilateral: Secondary | ICD-10-CM | POA: Diagnosis not present

## 2020-01-17 DIAGNOSIS — H16223 Keratoconjunctivitis sicca, not specified as Sjogren's, bilateral: Secondary | ICD-10-CM | POA: Diagnosis not present

## 2020-01-17 DIAGNOSIS — H02831 Dermatochalasis of right upper eyelid: Secondary | ICD-10-CM | POA: Diagnosis not present

## 2020-01-17 DIAGNOSIS — H0288A Meibomian gland dysfunction right eye, upper and lower eyelids: Secondary | ICD-10-CM | POA: Diagnosis not present

## 2020-01-17 DIAGNOSIS — H0288B Meibomian gland dysfunction left eye, upper and lower eyelids: Secondary | ICD-10-CM | POA: Diagnosis not present

## 2020-01-17 DIAGNOSIS — H02834 Dermatochalasis of left upper eyelid: Secondary | ICD-10-CM | POA: Diagnosis not present

## 2020-01-17 DIAGNOSIS — H18413 Arcus senilis, bilateral: Secondary | ICD-10-CM | POA: Diagnosis not present

## 2020-01-17 DIAGNOSIS — H0102B Squamous blepharitis left eye, upper and lower eyelids: Secondary | ICD-10-CM | POA: Diagnosis not present

## 2020-01-17 DIAGNOSIS — H0102A Squamous blepharitis right eye, upper and lower eyelids: Secondary | ICD-10-CM | POA: Diagnosis not present

## 2020-01-17 LAB — HM DIABETES EYE EXAM

## 2020-01-18 ENCOUNTER — Other Ambulatory Visit: Payer: Self-pay | Admitting: *Deleted

## 2020-01-18 NOTE — Progress Notes (Signed)
Infusion orders are current for patient CBC CMP Tylenol Benadryl appointments are up to date and follow up appointment  is scheduled TB gold not due yet.  

## 2020-01-23 ENCOUNTER — Other Ambulatory Visit: Payer: Self-pay

## 2020-01-23 ENCOUNTER — Ambulatory Visit (HOSPITAL_COMMUNITY)
Admission: RE | Admit: 2020-01-23 | Discharge: 2020-01-23 | Disposition: A | Discharge status: Home / Self Care | Payer: Medicare Other | Source: Ambulatory Visit | Attending: Rheumatology | Admitting: Rheumatology

## 2020-01-23 DIAGNOSIS — H209 Unspecified iridocyclitis: Secondary | ICD-10-CM | POA: Diagnosis not present

## 2020-01-23 DIAGNOSIS — D869 Sarcoidosis, unspecified: Secondary | ICD-10-CM | POA: Insufficient documentation

## 2020-01-23 LAB — COMPREHENSIVE METABOLIC PANEL
ALT: 16 U/L (ref 0–44)
AST: 21 U/L (ref 15–41)
Albumin: 4 g/dL (ref 3.5–5.0)
Alkaline Phosphatase: 52 U/L (ref 38–126)
Anion gap: 9 (ref 5–15)
BUN: 16 mg/dL (ref 8–23)
CO2: 22 mmol/L (ref 22–32)
Calcium: 8.9 mg/dL (ref 8.9–10.3)
Chloride: 108 mmol/L (ref 98–111)
Creatinine, Ser: 0.95 mg/dL (ref 0.44–1.00)
GFR calc Af Amer: >60 mL/min (ref >60)
GFR calc non Af Amer: >60 mL/min (ref >60)
Glucose, Bld: 131 mg/dL — ABNORMAL HIGH (ref 70–99)
Potassium: 4 mmol/L (ref 3.5–5.1)
Sodium: 139 mmol/L (ref 135–145)
Total Bilirubin: 0.8 mg/dL (ref 0.3–1.2)
Total Protein: 6.7 g/dL (ref 6.5–8.1)

## 2020-01-23 LAB — CBC
HCT: 40.2 % (ref 36.0–46.0)
Hemoglobin: 13.6 g/dL (ref 12.0–15.0)
MCH: 31 pg (ref 26.0–34.0)
MCHC: 33.8 g/dL (ref 30.0–36.0)
MCV: 91.6 fL (ref 80.0–100.0)
Platelets: 303 10*3/uL (ref 150–400)
RBC: 4.39 MIL/uL (ref 3.87–5.11)
RDW: 13.7 % (ref 11.5–15.5)
WBC: 7 10*3/uL (ref 4.0–10.5)
nRBC: 0 % (ref 0.0–0.2)

## 2020-01-23 MED ORDER — DIPHENHYDRAMINE HCL 25 MG PO CAPS: 25.0000 mg | ORAL_CAPSULE | ORAL | Status: DC

## 2020-01-23 MED ORDER — ACETAMINOPHEN 325 MG PO TABS: 650.0000 mg | ORAL_TABLET | ORAL | Status: DC

## 2020-01-23 MED ORDER — SODIUM CHLORIDE 0.9 % IV SOLN
3.0000 mg/kg | INTRAVENOUS | Status: DC
  Administered 2020-01-23: 200 mg via INTRAVENOUS
  Filled 2020-01-23: qty 20

## 2020-01-30 ENCOUNTER — Other Ambulatory Visit: Payer: Self-pay | Admitting: Rheumatology

## 2020-01-30 ENCOUNTER — Ambulatory Visit (INDEPENDENT_AMBULATORY_CARE_PROVIDER_SITE_OTHER): Payer: Medicare Other | Admitting: Cardiovascular Disease

## 2020-01-30 ENCOUNTER — Encounter: Payer: Self-pay | Admitting: Cardiovascular Disease

## 2020-01-30 ENCOUNTER — Other Ambulatory Visit: Payer: Self-pay

## 2020-01-30 VITALS — BP 116/74 | HR 97 | Ht 63.0 in | Wt 145.8 lb

## 2020-01-30 DIAGNOSIS — I5189 Other ill-defined heart diseases: Secondary | ICD-10-CM | POA: Diagnosis not present

## 2020-01-30 DIAGNOSIS — I471 Supraventricular tachycardia: Secondary | ICD-10-CM

## 2020-01-30 DIAGNOSIS — I10 Essential (primary) hypertension: Secondary | ICD-10-CM

## 2020-01-30 NOTE — Patient Instructions (Signed)
Medication Instructions:  Your physician recommends that you continue on your current medications as directed. Please refer to the Current Medication list given to you today.   *If you need a refill on your cardiac medications before your next appointment, please call your pharmacy*  Lab Work: NONE   Testing/Procedures: NONE  Follow-Up: At [facility], you and your health needs are our priority.  As part of our continuing mission to provide you with exceptional heart care, we have created designated Provider Care Teams.  These Care Teams include your primary Cardiologist (physician) and Advanced Practice Providers (APPs -  Physician Assistants and Nurse Practitioners) who all work together to provide you with the care you need, when you need it.  We recommend signing up for the patient portal called "MyChart".  Sign up information is provided on this After Visit Summary.  MyChart is used to connect with patients for Virtual Visits (Telemedicine).  Patients are able to view lab/test results, encounter notes, upcoming appointments, etc.  Non-urgent messages can be sent to your provider as well.   To learn more about what you can do with MyChart, go to [email or web address]://www.mychart.com.    Your next appointment:   12 month(s) You will receive a reminder letter in the mail two months in advance. If you don't receive a letter, please call our office to schedule the follow-up appointment.   The format for your next appointment:   In Person  Provider:   You may see Tiffany Del Sol, MD or one of the following Advanced Practice Providers on your designated Care Team:    Luke Kilroy, PA-C  Callie Goodrich, PA-C  Jesse Cleaver, FNP    

## 2020-01-30 NOTE — Progress Notes (Signed)
Cardiology Office Note   Date:  01/30/2020   ID:  Renee Jones, DOB 1950/12/26, MRN [id number]  PCP:  Doreene Nest, NP  Cardiologist:   Chilton Si, MD  EP: Loman Brooklyn, MD   No chief complaint on file.   Patient ID:   Renee Jones is a 69 y.o. female paroxysmal SVT, HTN, GERD, and sarcoidosis.    History of Present Illness: Renee Jones reports episodes of SVT that have been occurring for as long as she can remember.  She previously took a beta blocker which helped her symptoms but stopped taking it 20 years ago because she didn't think she needed it anymore.  Renee Jones was previously a patient of Dr. Michaelle Copas and had a Holter monitor documenting SVT.  Renee Jones was evaluated in the ED 02/2015 with elevated BP to 197/120.   She had a cardiac MRI 10/2012 that did not reveal any delayed enhancement concerning for cardiac involvement of her sarcoid.  She underwent CPX testing to evaluate shortness of breath and it was suggestive of diastolic dysfunction.  She was started on metoprolol for SVT and it also helped her shortness of breath.  She was referred for an echo 08/2015 that revealed LVEF 55-60% with grade 1 diastolic dysfunction. She saw Dr. Elberta Fortis and has not been interested in ablation. Metoprolol was increased with an improvement in her symptoms.  At her last appointment Ms. Tomisha Reppucci reported increased episodes of palpitations.  However she misunderstood that her PCP recommended she stop hydrochlorothiazide and actually stop the metoprolol instead.  It was recommended that she resume metoprolol.  Since then she is feeling much better.  She has very rare episodes of palpitations.  It happened once or twice in the last month and lasted less than a minute.  She is walking for exercise for 30 minutes daily.  She has no exertional symptoms or edema.  She checks her blood pressure and it is been consistently in the 110s to 120s and under 80 diastolic.  She had her  knees injected and is feeling well.  She is also doing intermittent fasting to lose the weight she gained during COVID.  She is very hesitant about getting the Covid vaccine.  Renee Jones's husband died in February 04, 2018.  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    Past Medical History:  Diagnosis Date  . Allergy   . Asthma   . Bronchial spasm 09/14/2016  . DDD (degenerative disc disease), lumbar 09/14/2016  . Detached retina 09/14/2016  . Diastolic dysfunction 08/28/2015  . Essential hypertension 08/28/2015  . GERD (gastroesophageal reflux disease)   . Glaucoma 09/14/2016  . Neuromuscular disorder (HCC)   . Osteoarthritis of both hands 09/14/2016  . Osteoarthritis of both knees 09/14/2016  . Sarcoidosis    with +lymph node biopsy  . SVT (supraventricular tachycardia) (HCC) 08/28/2015  . Tachycardia   . Uveitis     Past Surgical History:  Procedure Laterality Date  . CATARACT EXTRACTION, BILATERAL  2017/02/04  . CYSTECTOMY    . EYE SURGERY Bilateral February 04, 2017   cataract removal   . LUMBAR SPINE SURGERY  02-05-08  . ROTATOR CUFF REPAIR Right 02/04/10  . ROTATOR CUFF REPAIR Left 06/2018  . TONSILLECTOMY AND ADENOIDECTOMY  1962  . TOTAL ABDOMINAL HYSTERECTOMY  1984     Current Outpatient Medications  Medication Sig Dispense Refill  . albuterol (PROVENTIL  HFA;VENTOLIN HFA) 108 (90 BASE) MCG/ACT inhaler Inhale 2 puffs into the lungs every 6 (six) hours as needed for wheezing or shortness of breath.    Marland Kitchen amLODipine (NORVASC) 5 MG tablet TAKE 1 TABLET(5 MG) BY MOUTH DAILY FOR BLOOD PRESSURE 90 tablet 1  . cholecalciferol (VITAMIN D) 1000 units tablet Take 1,000 Units by mouth daily.    . cyclobenzaprine (FLEXERIL) 10 MG tablet TK 1 T PO Q 8 H PRF SPASM    . diclofenac sodium (VOLTAREN) 1 % GEL Apply three grams to three large joints up to three times daily. (Patient taking differently: Apply three grams to three large  joints up to three times daily as needed) 3 Tube 3  . fluticasone (FLONASE) 50 MCG/ACT nasal spray Place 2 sprays into both nostrils daily as needed for allergies or rhinitis.    . folic acid (FOLVITE) 1 MG tablet Take 2 tablets by mouth once daily 180 tablet 0  . InFLIXimab (REMICADE IV) Inject into the vein every 6 (six) weeks.    . methotrexate 2.5 MG tablet TAKE 10 TABLETS BY MOUTH ONCE A WEEK 120 tablet 0  . metoprolol tartrate (LOPRESSOR) 50 MG tablet TAKE 1 AND 1/2 TABLETS BY MOUTH TWICE A DAY 270 tablet 3  . Multiple Vitamin (MULTIVITAMIN WITH MINERALS) TABS tablet Take 1 tablet by mouth daily.    . naproxen sodium (ALEVE) 220 MG tablet Take 220 mg by mouth as needed.    . ondansetron (ZOFRAN) 4 MG tablet TAKE 1 TABLET BY MOUTH EVERY 6 HOURS AS NEEDED FOR NAUSEA AND VOMITING 30 tablet 0  . pantoprazole (PROTONIX) 40 MG tablet Take 1qd (Plz sched appt) 90 tablet 1   No current facility-administered medications for this visit.    Allergies:   Augmentin [amoxicillin-pot clavulanate], Codeine, Erythromycin, Sulfa antibiotics, and Tramadol    Social History:  The patient  reports that she has never smoked. She has never used smokeless tobacco. She reports that she does not drink alcohol or use drugs.   Family History:  The patient's family history includes Scleroderma in her daughter. She was adopted.    ROS:  Please see the history of present illness.   Otherwise, review of systems are positive for none.   All other systems are reviewed and negative.    PHYSICAL EXAM: VS:  BP 116/74   Pulse 97   Ht 5\' 3"  (1.6 m)   Wt 145 lb 12.8 oz (66.1 kg)   SpO2 98%   BMI 25.83 kg/m  , BMI Body mass index is 25.83 kg/m. GENERAL:  Well appearing HEENT: Pupils equal round and reactive, fundi not visualized, oral mucosa unremarkable NECK:  No jugular venous distention, waveform within normal limits, carotid upstroke brisk and symmetric, no bruits LUNGS:  Clear to auscultation  bilaterally HEART:  RRR.  PMI not displaced or sustained,S1 and S2 within normal limits, no S3, no S4, no clicks, no rubs, no murmurs ABD:  Flat, positive bowel sounds normal in frequency in pitch, no bruits, no rebound, no guarding, no midline pulsatile mass, no hepatomegaly, no splenomegaly EXT:  2 plus pulses throughout, no edema, no cyanosis no clubbing SKIN:  No rashes no nodules NEURO:  Cranial nerves II through XII grossly intact, motor grossly intact throughout PSYCH:  Cognitively intact, oriented to person place and time   EKG:  EKG is ordered today. 05/04/16:Sinus rhythm.  Rate 74 bpm.  Inferior TWI. 11/18/16: Sinus rhythm. Rate 72 bpm. We will monitor 11/30/19: Sinus rhythm.  85 bpm.  Nonspecific T wave abnormalities  Recent Labs: 01/23/2020: ALT 16; BUN 16; Creatinine, Ser 0.95; Hemoglobin 13.6; Platelets 303; Potassium 4.0; Sodium 139    Lipid Panel    Component Value Date/Time   CHOL 190 12/21/2018 1018   TRIG 171.0 (H) 12/21/2018 1018   HDL 47.90 12/21/2018 1018   CHOLHDL 4 12/21/2018 1018   VLDL 34.2 12/21/2018 1018   LDLCALC 108 (H) 12/21/2018 1018      Wt Readings from Last 3 Encounters:  01/30/20 145 lb 12.8 oz (66.1 kg)  01/23/20 140 lb (63.5 kg)  12/12/19 142 lb (64.4 kg)      Other studies Reviewed: Additional studies/ records that were reviewed today include: PCP note  Review of the above records demonstrates:  Please see elsewhere in the note.     CPX 05/01/15: Conclusion: Exercise testing with gas-exchange demonstrates a mild functional impairment when compared to matched sedentary norms. There is no significant evidence of EIB. The combination of reduced PVO2, hypertensive BP, elevated VE/VCO2 slope, and flat O2 pulse suggests possible circulatory limitations (specifically diastolic dysfunction).   2014 CMRI 1. Normal LV size and systolic function, EF 69%.  2. Normal RV size and systolic function.  3. No myocardial delayed enhancement, so  no definitive evidence for prior MI, infiltrative disease, or myocarditis.  4. No evidence on this exam for cardiac sarcoidosis.   ASSESSMENT AND PLAN:  # SVT: Ms. Ayla Dunigan is doing much better since she resumed metoprolol.  Episodes are very rare and short-lived.  # Diastolic dysfunction: CPX was suggestive of diastolic dysfunction and her symptoms have improved with metoprolol.  However, her echo only showed grade 1 diastolic function and she does not clinically appear to have heart failure. Continue metoprolol.  # Hypertension: BP is well-controlled.  It has been stable despite stopping hydrochlorothiazide.  Continue metoprolol and amlodipine.  # Sarcoidosis: No evidence of cardiac involvement on MRI 10/2012.  # Chest pain: Resolved with treatment of GERD   Current medicines are reviewed at length with the patient today.  The patient does not have concerns regarding medicines.  The following changes have been made: None  Labs/ tests ordered today include: none.  No orders of the defined types were placed in this encounter.    Disposition:   FU with Dr. Elmarie Shiley C. Duke Salvia in 1 year   Signed, Chilton Si, MD  01/30/2020 8:56 AM     [facility]

## 2020-01-31 ENCOUNTER — Other Ambulatory Visit: Payer: Self-pay | Admitting: Rheumatology

## 2020-01-31 NOTE — Telephone Encounter (Signed)
Last Visit: 11/16/2019 Next Visit: 04/16/2020  Okay to refill per Dr. Deveshwar. 

## 2020-02-07 ENCOUNTER — Other Ambulatory Visit: Payer: Self-pay | Admitting: Primary Care

## 2020-02-07 DIAGNOSIS — I10 Essential (primary) hypertension: Secondary | ICD-10-CM

## 2020-02-07 DIAGNOSIS — R7303 Prediabetes: Secondary | ICD-10-CM

## 2020-02-13 ENCOUNTER — Other Ambulatory Visit: Payer: Self-pay

## 2020-02-13 ENCOUNTER — Ambulatory Visit (INDEPENDENT_AMBULATORY_CARE_PROVIDER_SITE_OTHER): Payer: Medicare Other

## 2020-02-13 VITALS — BP 121/72 | HR 72 | Wt 144.0 lb

## 2020-02-13 DIAGNOSIS — Z0000 Encounter for general adult medical examination without abnormal findings: Secondary | ICD-10-CM

## 2020-02-13 NOTE — Progress Notes (Signed)
Subjective:   Renee Jones is a 69 y.o. female who presents for Medicare Annual (Subsequent) preventive examination.  Review of Systems: N/A   This visit is being conducted through telemedicine via telephone at the nurse health advisor's home address due to the COVID-19 pandemic. This patient has given me verbal consent via doximity to conduct this visit, patient states they are participating from their home address. Patient and myself are on the telephone call. There is no referral for this visit. Some vital signs may be absent or patient reported.    Patient identification: identified by name, DOB, and current address   Cardiac Risk Factors include: advanced age (>39men, >103 women);hypertension     Objective:     Vitals: BP 121/72   Pulse 72   Wt 144 lb (65.3 kg)   BMI 25.51 kg/m   Body mass index is 25.51 kg/m.  Advanced Directives 02/13/2020 03/08/2015 07/07/2014  Does Patient Have a Medical Advance Directive? Yes No No  Type of Paramedic of [place];Living will - -  Copy of [place] in Chart? No - copy requested - -  Would patient like information on creating a medical advance directive? - - No - patient declined information    Tobacco Social History   Tobacco Use  Smoking Status Never Smoker  Smokeless Tobacco Never Used     Counseling given: Not Answered   Clinical Intake:  Pre-visit preparation completed: Yes  Pain : 0-10 Pain Score: 6  Pain Type: Chronic pain Pain Location: Back Pain Orientation: Lower Pain Descriptors / Indicators: Aching Pain Onset: More than a month ago Pain Frequency: Intermittent     Nutritional Risks: Nausea/ vomitting/ diarrhea(nausea with medication only) Diabetes: No  How often do you need to have someone help you when you read instructions, pamphlets, or other written materials from your doctor or pharmacy?: 1 - Never What is the last grade level you completed in  school?: Masters  Interpreter Needed?: No  Information entered by :: CJohnson, LPN  Past Medical History:  Diagnosis Date  . Allergy   . Asthma   . Bronchial spasm 09/14/2016  . DDD (degenerative disc disease), lumbar 09/14/2016  . Detached retina 09/14/2016  . Diastolic dysfunction 36/10/4429  . Essential hypertension 08/28/2015  . GERD (gastroesophageal reflux disease)   . Glaucoma 09/14/2016  . Neuromuscular disorder ([place])   . Osteoarthritis of both hands 09/14/2016  . Osteoarthritis of both knees 09/14/2016  . Sarcoidosis    with +lymph node biopsy  . SVT (supraventricular tachycardia) ([place]) 08/28/2015  . Tachycardia   . Uveitis    Past Surgical History:  Procedure Laterality Date  . CATARACT EXTRACTION, BILATERAL  2018  . CYSTECTOMY    . EYE SURGERY Bilateral 2018   cataract removal   . LUMBAR SPINE SURGERY  2009  . ROTATOR CUFF REPAIR Right 2011  . ROTATOR CUFF REPAIR Left 06/2018  . [place]  . TOTAL ABDOMINAL HYSTERECTOMY  1984   Family History  Adopted: Yes  Problem Relation Age of Onset  . Scleroderma Daughter    Social History   Socioeconomic History  . Marital status: Married    Spouse name: Not on file  . Number of children: Not on file  . Years of education: Not on file  . Highest education level: Not on file  Occupational History  . Not on file  Tobacco Use  . Smoking status: Never Smoker  . Smokeless tobacco:  Never Used  Substance and Sexual Activity  . Alcohol use: No    Alcohol/week: 0.0 standard drinks  . Drug use: No  . Sexual activity: Not on file  Other Topics Concern  . Not on file  Social History Narrative   Widower.   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   Social Determinants of Health   Financial Resource Strain: Low Risk   . Difficulty of Paying Living Expenses: Not hard at all  Food Insecurity: No Food Insecurity  . Worried About Programme researcher, broadcasting/film/video in the Last Year:  Never true  . Ran Out of Food in the Last Year: Never true  Transportation Needs: No Transportation Needs  . Lack of Transportation (Medical): No  . Lack of Transportation (Non-Medical): No  Physical Activity: Insufficiently Active  . Days of Exercise per Week: 3 days  . Minutes of Exercise per Session: 30 min  Stress: Stress Concern Present  . Feeling of Stress : To some extent  Social Connections:   . Frequency of Communication with Friends and Family:   . Frequency of Social Gatherings with Friends and Family:   . Attends Religious Services:   . Active Member of Clubs or Organizations:   . Attends Banker Meetings:   Marland Kitchen Marital Status:     Outpatient Encounter Medications as of 02/13/2020  Medication Sig  . albuterol (PROVENTIL HFA;VENTOLIN HFA) 108 (90 BASE) MCG/ACT inhaler Inhale 2 puffs into the lungs every 6 (six) hours as needed for wheezing or shortness of breath.  Marland Kitchen amLODipine (NORVASC) 5 MG tablet Take 5 mg by mouth daily.  . cholecalciferol (VITAMIN D) 1000 units tablet Take 1,000 Units by mouth daily.  . cyclobenzaprine (FLEXERIL) 10 MG tablet TK 1 T PO Q 8 H PRF SPASM  . diclofenac sodium (VOLTAREN) 1 % GEL Apply three grams to three large joints up to three times daily. (Patient taking differently: Apply three grams to three large joints up to three times daily as needed)  . fluticasone (FLONASE) 50 MCG/ACT nasal spray Place 2 sprays into both nostrils daily as needed for allergies or rhinitis.  . folic acid (FOLVITE) 1 MG tablet Take 2 tablets by mouth once daily  . InFLIXimab (REMICADE IV) Inject into the vein every 6 (six) weeks.  . methotrexate 2.5 MG tablet TAKE 10 TABLETS BY MOUTH ONCE A WEEK  . metoprolol tartrate (LOPRESSOR) 50 MG tablet TAKE 1 AND 1/2 TABLETS BY MOUTH TWICE A DAY  . Multiple Vitamin (MULTIVITAMIN WITH MINERALS) TABS tablet Take 1 tablet by mouth daily.  . naproxen sodium (ALEVE) 220 MG tablet Take 220 mg by mouth as needed.  .  ondansetron (ZOFRAN) 4 MG tablet TAKE 1 TABLET BY MOUTH EVERY 6 HOURS AS NEEDED FOR NAUSEA AND VOMITING  . pantoprazole (PROTONIX) 40 MG tablet Take 1qd (Plz sched appt)  . [DISCONTINUED] amLODipine (NORVASC) 5 MG tablet TAKE 1 TABLET(5 MG) BY MOUTH DAILY FOR BLOOD PRESSURE (Patient not taking: Reported on 02/13/2020)   No facility-administered encounter medications on file as of 02/13/2020.    Activities of Daily Living In your present state of health, do you have any difficulty performing the following activities: 02/13/2020  Hearing? N  Vision? N  Difficulty concentrating or making decisions? N  Walking or climbing stairs? N  Dressing or bathing? N  Doing errands, shopping? N  Preparing Food and eating ? N  Using the Toilet? N  In the past six months, have  you accidently leaked urine? N  Do you have problems with loss of bowel control? N  Managing your Medications? N  Managing your Finances? N  Housekeeping or managing your Housekeeping? N  Some recent data might be hidden    Patient Care Team: Doreene Nest, NP as PCP - General (Internal Medicine) Chilton Si, MD as PCP - Cardiology (Cardiology)    Assessment:   This is a routine wellness examination for Renee Jones.  Exercise Activities and Dietary recommendations Current Exercise Habits: Home exercise routine, Type of exercise: walking, Time (Minutes): 30, Frequency (Times/Week): 3, Weekly Exercise (Minutes/Week): 90, Intensity: Moderate, Exercise limited by: None identified  Goals    . Patient Stated     02/13/2020, I will continue at least 3 days a week for 30 minutes or more.        Fall Risk Fall Risk  02/13/2020  Falls in the past year? 0  Number falls in past yr: 0  Injury with Fall? 0  Risk for fall due to : Medication side effect  Follow up Falls evaluation completed;Falls prevention discussed   Is the patient's home free of loose throw rugs in walkways, pet beds, electrical cords, etc?   yes       Grab bars in the bathroom? no      Handrails on the stairs?   yes      Adequate lighting?   yes  Timed Get Up and Go performed: N/A  Depression Screen PHQ 2/9 Scores 02/13/2020  PHQ - 2 Score 0  PHQ- 9 Score 0     Cognitive Function MMSE - Mini Mental State Exam 02/13/2020  Orientation to time 5  Orientation to Place 5  Registration 3  Attention/ Calculation 5  Recall 3  Language- repeat 1       Mini Cog  Mini-Cog screen was completed. Maximum score is 22. A value of 0 denotes this part of the MMSE was not completed or the patient failed this part of the Mini-Cog screening.  Immunization History  Administered Date(s) Administered  . DTaP 07/26/2010  . Influenza Split 07/19/2014  . Influenza,inj,Quad PF,6+ Mos 07/19/2016, 12/21/2018  . Pneumococcal Polysaccharide-23 09/19/2012  . Pneumococcal-Unspecified 07/26/2012, 01/18/2015  . Td 03/31/2010  . Tdap 05/07/2010  . Varicella 09/19/2012  . Zoster 07/26/2012    Qualifies for Shingles Vaccine: Yes  Screening Tests Health Maintenance  Topic Date Due  . Hepatitis C Screening  Never done  . COVID-19 Vaccine (1) Never done  . DEXA SCAN  Never done  . PNA vac Low Risk Adult (1 of 2 - PCV13) 08/17/2016  . TETANUS/TDAP  05/07/2020  . INFLUENZA VACCINE  05/19/2020  . MAMMOGRAM  08/21/2021  . COLONOSCOPY  02/24/2027    Cancer Screenings: Lung: Low Dose CT Chest recommended if Age 59-80 years, 30 pack-year currently smoking OR have quit w/in 15 years. Patient does not qualify. Breast:  Up to date on Mammogram: Yes, completed 08/22/2019   Up to date of Bone Density/Dexa: No, due, Patient agrees to have this done Colorectal: completed 02/23/2017  Additional Screenings:  Hepatitis C Screening: due     Plan:   Patient will continue at least 3 days a week for 30 minutes or more.   I have personally reviewed and noted the following in the patient's chart:   . Medical and social history . Use of alcohol, tobacco or  illicit drugs  . Current medications and supplements . Functional ability and status . Nutritional status .  Physical activity . Advanced directives . List of other physicians . Hospitalizations, surgeries, and ER visits in previous 12 months . Vitals . Screenings to include cognitive, depression, and falls . Referrals and appointments  In addition, I have reviewed and discussed with patient certain preventive protocols, quality metrics, and best practice recommendations. A written personalized care plan for preventive services as well as general preventive health recommendations were provided to patient.     Janalyn Shy, LPN  6/38/4536

## 2020-02-13 NOTE — Progress Notes (Signed)
PCP notes:  Health Maintenance: Prevnar 13- due Dexa- due Hep C- due   Abnormal Screenings: none   Patient concerns: none   Nurse concerns: none   Next PCP appt.: 02/20/2020 @ 9:40 am

## 2020-02-13 NOTE — Patient Instructions (Signed)
[name] , Thank you for taking time to come for your Medicare Wellness Visit. I appreciate your ongoing commitment to your health goals. Please review the following plan we discussed and let me know if I can assist you in the future.   Screening recommendations/referrals: Colonoscopy: Up to date, completed 02/23/2017 Mammogram: Up to date, completed 08/22/2019 Bone Density: due Recommended yearly ophthalmology/optometry visit for glaucoma screening and checkup Recommended yearly dental visit for hygiene and checkup  Vaccinations: Influenza vaccine: Fall 2021 Pneumococcal vaccine: due Tdap vaccine: Up to date, completed 05/07/2010 Shingles vaccine: discussed    Advanced directives: Please bring a copy of your POA (Power of [place]) and/or Living Will to your next appointment.   Conditions/risks identified: hypertension  Next appointment: 02/20/2020 @ 9:40 am    Preventive Care 65 Years and Older, Female Preventive care refers to lifestyle choices and visits with your health care provider that can promote health and wellness. What does preventive care include?  A yearly physical exam. This is also called an annual well check.  Dental exams once or twice a year.  Routine eye exams. Ask your health care provider how often you should have your eyes checked.  Personal lifestyle choices, including:  Daily care of your teeth and gums.  Regular physical activity.  Eating a healthy diet.  Avoiding tobacco and drug use.  Limiting alcohol use.  Practicing safe sex.  Taking low-dose aspirin every day.  Taking vitamin and mineral supplements as recommended by your health care provider. What happens during an annual well check? The services and screenings done by your health care provider during your annual well check will depend on your age, overall health, lifestyle risk factors, and family history of disease. Counseling  Your health care provider may ask you questions  about your:  Alcohol use.  Tobacco use.  Drug use.  Emotional well-being.  Home and relationship well-being.  Sexual activity.  Eating habits.  History of falls.  Memory and ability to understand (cognition).  Work and work Astronomer.  Reproductive health. Screening  You may have the following tests or measurements:  Height, weight, and BMI.  Blood pressure.  Lipid and cholesterol levels. These may be checked every 5 years, or more frequently if you are over 83 years old.  Skin check.  Lung cancer screening. You may have this screening every year starting at age 21 if you have a 30-pack-year history of smoking and currently smoke or have quit within the past 15 years.  Fecal occult blood test (FOBT) of the stool. You may have this test every year starting at age 68.  Flexible sigmoidoscopy or colonoscopy. You may have a sigmoidoscopy every 5 years or a colonoscopy every 10 years starting at age 61.  Hepatitis C blood test.  Hepatitis B blood test.  Sexually transmitted disease (STD) testing.  Diabetes screening. This is done by checking your blood sugar (glucose) after you have not eaten for a while (fasting). You may have this done every 1-3 years.  Bone density scan. This is done to screen for osteoporosis. You may have this done starting at age 47.  Mammogram. This may be done every 1-2 years. Talk to your health care provider about how often you should have regular mammograms. Talk with your health care provider about your test results, treatment options, and if necessary, the need for more tests. Vaccines  Your health care provider may recommend certain vaccines, such as:  Influenza vaccine. This is recommended every year.  Tetanus, diphtheria, and acellular pertussis (Tdap, Td) vaccine. You may need a Td booster every 10 years.  Zoster vaccine. You may need this after age 31.  Pneumococcal 13-valent conjugate (PCV13) vaccine. One dose is  recommended after age 15.  Pneumococcal polysaccharide (PPSV23) vaccine. One dose is recommended after age 79. Talk to your health care provider about which screenings and vaccines you need and how often you need them. This information is not intended to replace advice given to you by your health care provider. Make sure you discuss any questions you have with your health care provider. Document Released: 11/01/2015 Document Revised: 06/24/2016 Document Reviewed: 08/06/2015 Elsevier Interactive Patient Education  2017 [place] Prevention in the Home Falls can cause injuries. They can happen to people of all ages. There are many things you can do to make your home safe and to help prevent falls. What can I do on the outside of my home?  Regularly fix the edges of walkways and driveways and fix any cracks.  Remove anything that might make you trip as you walk through a door, such as a raised step or threshold.  Trim any bushes or trees on the path to your home.  Use bright outdoor lighting.  Clear any walking paths of anything that might make someone trip, such as rocks or tools.  Regularly check to see if handrails are loose or broken. Make sure that both sides of any steps have handrails.  Any raised decks and porches should have guardrails on the edges.  Have any leaves, snow, or ice cleared regularly.  Use sand or salt on walking paths during winter.  Clean up any spills in your garage right away. This includes oil or grease spills. What can I do in the bathroom?  Use night lights.  Install grab bars by the toilet and in the tub and shower. Do not use towel bars as grab bars.  Use non-skid mats or decals in the tub or shower.  If you need to sit down in the shower, use a plastic, non-slip stool.  Keep the floor dry. Clean up any water that spills on the floor as soon as it happens.  Remove soap buildup in the tub or shower regularly.  Attach bath mats  securely with double-sided non-slip rug tape.  Do not have throw rugs and other things on the floor that can make you trip. What can I do in the bedroom?  Use night lights.  Make sure that you have a light by your bed that is easy to reach.  Do not use any sheets or blankets that are too big for your bed. They should not hang down onto the floor.  Have a firm chair that has side arms. You can use this for support while you get dressed.  Do not have throw rugs and other things on the floor that can make you trip. What can I do in the kitchen?  Clean up any spills right away.  Avoid walking on wet floors.  Keep items that you use a lot in easy-to-reach places.  If you need to reach something above you, use a strong step stool that has a grab bar.  Keep electrical cords out of the way.  Do not use floor polish or wax that makes floors slippery. If you must use wax, use non-skid floor wax.  Do not have throw rugs and other things on the floor that can make you trip. What can I do  with my stairs?  Do not leave any items on the stairs.  Make sure that there are handrails on both sides of the stairs and use them. Fix handrails that are broken or loose. Make sure that handrails are as long as the stairways.  Check any carpeting to make sure that it is firmly attached to the stairs. Fix any carpet that is loose or worn.  Avoid having throw rugs at the top or bottom of the stairs. If you do have throw rugs, attach them to the floor with carpet tape.  Make sure that you have a light switch at the top of the stairs and the bottom of the stairs. If you do not have them, ask someone to add them for you. What else can I do to help prevent falls?  Wear shoes that:  Do not have high heels.  Have rubber bottoms.  Are comfortable and fit you well.  Are closed at the toe. Do not wear sandals.  If you use a stepladder:  Make sure that it is fully opened. Do not climb a closed  stepladder.  Make sure that both sides of the stepladder are locked into place.  Ask someone to hold it for you, if possible.  Clearly mark and make sure that you can see:  Any grab bars or handrails.  First and last steps.  Where the edge of each step is.  Use tools that help you move around (mobility aids) if they are needed. These include:  Canes.  Walkers.  Scooters.  Crutches.  Turn on the lights when you go into a dark area. Replace any light bulbs as soon as they burn out.  Set up your furniture so you have a clear path. Avoid moving your furniture around.  If any of your floors are uneven, fix them.  If there are any pets around you, be aware of where they are.  Review your medicines with your doctor. Some medicines can make you feel dizzy. This can increase your chance of falling. Ask your doctor what other things that you can do to help prevent falls. This information is not intended to replace advice given to you by your health care provider. Make sure you discuss any questions you have with your health care provider. Document Released: 08/01/2009 Document Revised: 03/12/2016 Document Reviewed: 11/09/2014 Elsevier Interactive Patient Education  2017 [employer].

## 2020-02-15 ENCOUNTER — Other Ambulatory Visit (INDEPENDENT_AMBULATORY_CARE_PROVIDER_SITE_OTHER): Payer: Medicare Other

## 2020-02-15 ENCOUNTER — Other Ambulatory Visit: Payer: Self-pay

## 2020-02-15 DIAGNOSIS — I10 Essential (primary) hypertension: Secondary | ICD-10-CM

## 2020-02-15 DIAGNOSIS — R7303 Prediabetes: Secondary | ICD-10-CM

## 2020-02-15 LAB — LIPID PANEL
Cholesterol: 198 mg/dL (ref 0–200)
HDL: 45.2 mg/dL (ref >39.00)
LDL Cholesterol: 121 mg/dL — ABNORMAL HIGH (ref 0–99)
NonHDL: 152.94
Total CHOL/HDL Ratio: 4
Triglycerides: 162 mg/dL — ABNORMAL HIGH (ref 0.0–149.0)
VLDL: 32.4 mg/dL (ref 0.0–40.0)

## 2020-02-15 LAB — HEMOGLOBIN A1C: Hgb A1c MFr Bld: 6.3 % (ref 4.6–6.5)

## 2020-02-20 ENCOUNTER — Ambulatory Visit (INDEPENDENT_AMBULATORY_CARE_PROVIDER_SITE_OTHER): Payer: Medicare Other | Admitting: Primary Care

## 2020-02-20 ENCOUNTER — Other Ambulatory Visit: Payer: Self-pay

## 2020-02-20 ENCOUNTER — Encounter: Payer: Self-pay | Admitting: Primary Care

## 2020-02-20 DIAGNOSIS — D8686 Sarcoid arthropathy: Secondary | ICD-10-CM | POA: Diagnosis not present

## 2020-02-20 DIAGNOSIS — M19041 Primary osteoarthritis, right hand: Secondary | ICD-10-CM | POA: Diagnosis not present

## 2020-02-20 DIAGNOSIS — M5136 Other intervertebral disc degeneration, lumbar region: Secondary | ICD-10-CM

## 2020-02-20 DIAGNOSIS — M19042 Primary osteoarthritis, left hand: Secondary | ICD-10-CM | POA: Diagnosis not present

## 2020-02-20 DIAGNOSIS — K219 Gastro-esophageal reflux disease without esophagitis: Secondary | ICD-10-CM

## 2020-02-20 DIAGNOSIS — R7303 Prediabetes: Secondary | ICD-10-CM

## 2020-02-20 DIAGNOSIS — I10 Essential (primary) hypertension: Secondary | ICD-10-CM | POA: Diagnosis not present

## 2020-02-20 DIAGNOSIS — I471 Supraventricular tachycardia: Secondary | ICD-10-CM

## 2020-02-20 DIAGNOSIS — H409 Unspecified glaucoma: Secondary | ICD-10-CM | POA: Diagnosis not present

## 2020-02-20 DIAGNOSIS — H209 Unspecified iridocyclitis: Secondary | ICD-10-CM

## 2020-02-20 DIAGNOSIS — M170 Bilateral primary osteoarthritis of knee: Secondary | ICD-10-CM

## 2020-02-20 NOTE — Assessment & Plan Note (Signed)
Following every 6 months with ophthalmology.

## 2020-02-20 NOTE — Assessment & Plan Note (Signed)
Doing well on PPI, cannot tolerate without meds.

## 2020-02-20 NOTE — Assessment & Plan Note (Signed)
Following with cardiology. Continue metoprolol tartrate and BP control.

## 2020-02-20 NOTE — Progress Notes (Signed)
Subjective:    Patient ID: Renee Jones, female    DOB: August 29, 1951, 69 y.o.   MRN: [id number]  HPI  This visit occurred during the SARS-CoV-2 public health emergency.  Safety protocols were in place, including screening questions prior to the visit, additional usage of staff PPE, and extensive cleaning of exam room while observing appropriate contact time as indicated for disinfecting solutions.   Renee Jones is a 69 year old female who presents today for [place] Part 2. She spoke with our health advisor last week.  Immunizations: -Tetanus: Completed in 2011 -Influenza: Completed this past season  -Shingles: Completed Zostavax in 2013 -Pneumonia: Completed several doses  -Covid-19: Undecided   Diet: She endorses a healthy diet. Exercise: She is walking some.  Mammogram: Completed in November 2020 Dexa: No recent scan Colonoscopy: Completed in 2018 Hep C Screen: Due   BP Readings from Last 3 Encounters:  02/20/20 122/78  02/13/20 121/72  01/30/20 116/74   Wt Readings from Last 3 Encounters:  02/20/20 145 lb 8 oz (66 kg)  02/13/20 144 lb (65.3 kg)  01/30/20 145 lb 12.8 oz (66.1 kg)   The 10-year ASCVD risk score Mikey Bussing DC Jr., et al., 2013) is: 9.8%   Values used to calculate the score:     Age: 61 years     Sex: Female     Is Non-Hispanic African American: Yes     Diabetic: No     Tobacco smoker: No     Systolic Blood Pressure: 154 mmHg     Is BP treated: Yes     HDL Cholesterol: 45.2 mg/dL     Total Cholesterol: 198 mg/dL   Review of Systems  Respiratory: Negative for shortness of breath.   Cardiovascular: Negative for chest pain.       Intermittent tachycardia, improved since metoprolol was added.  Gastrointestinal: Negative for constipation and diarrhea.  Musculoskeletal: Positive for arthralgias and back pain.  Neurological: Negative for dizziness and headaches.  Psychiatric/Behavioral: The patient is not nervous/anxious.        Past Medical  History:  Diagnosis Date  . Allergy   . Asthma   . Bronchial spasm 09/14/2016  . DDD (degenerative disc disease), lumbar 09/14/2016  . Detached retina 09/14/2016  . Diastolic dysfunction 00/05/6760  . Essential hypertension 08/28/2015  . GERD (gastroesophageal reflux disease)   . Glaucoma 09/14/2016  . Neuromuscular disorder ([place])   . Osteoarthritis of both hands 09/14/2016  . Osteoarthritis of both knees 09/14/2016  . Sarcoidosis    with +lymph node biopsy  . SVT (supraventricular tachycardia) ([place]) 08/28/2015  . Tachycardia   . Uveitis      Social History   Socioeconomic History  . Marital status: Married    Spouse name: Not on file  . Number of children: Not on file  . Years of education: Not on file  . Highest education level: Not on file  Occupational History  . Not on file  Tobacco Use  . Smoking status: Never Smoker  . Smokeless tobacco: Never Used  Substance and Sexual Activity  . Alcohol use: No    Alcohol/week: 0.0 standard drinks  . Drug use: No  . Sexual activity: Not on file  Other Topics Concern  . Not on file  Social History Narrative   Widower.   Retired. Worked as a Education officer, museum.   Moved from [place].   Social Determinants of Health   Financial Resource Strain: Low Risk   . Difficulty of Paying  Living Expenses: Not hard at all  Food Insecurity: No Food Insecurity  . Worried About Programme researcher, broadcasting/film/video in the Last Year: Never true  . Ran Out of Food in the Last Year: Never true  Transportation Needs: No Transportation Needs  . Lack of Transportation (Medical): No  . Lack of Transportation (Non-Medical): No  Physical Activity: Insufficiently Active  . Days of Exercise per Week: 3 days  . Minutes of Exercise per Session: 30 min  Stress: Stress Concern Present  . Feeling of Stress : To some extent  Social Connections:   . Frequency of Communication with Friends and Family:   . Frequency of Social Gatherings with Friends and Family:   . Attends  Religious Services:   . Active Member of Clubs or Organizations:   . Attends Banker Meetings:   Marland Kitchen Marital Status:   Intimate Partner Violence: Not At Risk  . Fear of Current or Ex-Partner: No  . Emotionally Abused: No  . Physically Abused: No  . Sexually Abused: No    Past Surgical History:  Procedure Laterality Date  . CATARACT EXTRACTION, BILATERAL  2018  . CYSTECTOMY    . EYE SURGERY Bilateral 2018   cataract removal   . LUMBAR SPINE SURGERY  2009  . ROTATOR CUFF REPAIR Right 2011  . ROTATOR CUFF REPAIR Left 06/2018  . TONSILLECTOMY AND ADENOIDECTOMY  1962  . TOTAL ABDOMINAL HYSTERECTOMY  1984    Family History  Adopted: Yes  Problem Relation Age of Onset  . Scleroderma Daughter     Allergies  Allergen Reactions  . Augmentin [Amoxicillin-Pot Clavulanate]   . Codeine Itching  . Erythromycin Itching  . Sulfa Antibiotics Itching     vomiting  . Tramadol Itching    Current Outpatient Medications on File Prior to Visit  Medication Sig Dispense Refill  . albuterol (PROVENTIL HFA;VENTOLIN HFA) 108 (90 BASE) MCG/ACT inhaler Inhale 2 puffs into the lungs every 6 (six) hours as needed for wheezing or shortness of breath.    Marland Kitchen amLODipine (NORVASC) 5 MG tablet Take 5 mg by mouth daily.    . cholecalciferol (VITAMIN D) 1000 units tablet Take 1,000 Units by mouth daily.    . cyclobenzaprine (FLEXERIL) 10 MG tablet TK 1 T PO Q 8 H PRF SPASM    . diclofenac sodium (VOLTAREN) 1 % GEL Apply three grams to three large joints up to three times daily. (Patient taking differently: Apply three grams to three large joints up to three times daily as needed) 3 Tube 3  . fluticasone (FLONASE) 50 MCG/ACT nasal spray Place 2 sprays into both nostrils daily as needed for allergies or rhinitis.    . folic acid (FOLVITE) 1 MG tablet Take 2 tablets by mouth once daily 180 tablet 0  . InFLIXimab (REMICADE IV) Inject into the vein every 6 (six) weeks.    . methotrexate 2.5 MG tablet  TAKE 10 TABLETS BY MOUTH ONCE A WEEK 120 tablet 0  . metoprolol tartrate (LOPRESSOR) 50 MG tablet TAKE 1 AND 1/2 TABLETS BY MOUTH TWICE A DAY 270 tablet 3  . Multiple Vitamin (MULTIVITAMIN WITH MINERALS) TABS tablet Take 1 tablet by mouth daily.    . naproxen sodium (ALEVE) 220 MG tablet Take 220 mg by mouth as needed.    . ondansetron (ZOFRAN) 4 MG tablet TAKE 1 TABLET BY MOUTH EVERY 6 HOURS AS NEEDED FOR NAUSEA AND VOMITING 30 tablet 0  . pantoprazole (PROTONIX) 40 MG tablet Take 1qd (Plz  sched appt) 90 tablet 1   No current facility-administered medications on file prior to visit.    BP 122/78   Pulse 79   Temp (!) 96.8 F (36 C) (Temporal)   Ht 5\' 3"  (1.6 m)   Wt 145 lb 8 oz (66 kg)   SpO2 98%   BMI 25.77 kg/m    Objective:   Physical Exam  Constitutional: She appears well-nourished.  Cardiovascular: Normal rate and regular rhythm.  Respiratory: Effort normal and breath sounds normal.  Musculoskeletal:     Cervical back: Neck supple.  Skin: Skin is warm and dry.  Psychiatric: She has a normal mood and affect.           Assessment & Plan:

## 2020-02-20 NOTE — Assessment & Plan Note (Addendum)
No recent follow up in years, doing well overall.  No use of albuterol in years.

## 2020-02-20 NOTE — Assessment & Plan Note (Signed)
Following with ophthalmology, doing well on Remicaid.

## 2020-02-20 NOTE — Assessment & Plan Note (Signed)
Doing well on diclofenac topically, also with knee injections. Following with orthopedics.

## 2020-02-20 NOTE — Assessment & Plan Note (Signed)
Overall doing well on cyclobenzaprine and Aleve PRN. Following with chiropractor.

## 2020-02-20 NOTE — Assessment & Plan Note (Signed)
Well controlled on current regimen, continue amlodipine and metoprolol tartrate.

## 2020-02-20 NOTE — Assessment & Plan Note (Signed)
Slight increase in A1C compared to last year. She admits to being more sedentary since Covid-19 but plans on increasing activity and improving diet.

## 2020-02-20 NOTE — Assessment & Plan Note (Signed)
Overall doing well on Remicaid and Methotrexate. Continue same.

## 2020-02-20 NOTE — Patient Instructions (Signed)
Start exercising. You should be getting 150 minutes of exercise weekly.  It's important to improve your diet by reducing consumption of fast food, fried food, processed snack foods, sugary drinks. Increase consumption of fresh vegetables and fruits, whole grains, water.  Ensure you are drinking 64 ounces of water daily.  Schedule a lab only appointment for 4 months to repeat blood sugar and cholesterol levels.  It was a pleasure to see you today!

## 2020-02-21 ENCOUNTER — Other Ambulatory Visit: Payer: Self-pay | Admitting: Rheumatology

## 2020-02-21 ENCOUNTER — Other Ambulatory Visit: Payer: Self-pay | Admitting: Primary Care

## 2020-02-21 NOTE — Telephone Encounter (Signed)
Last Visit: 11/16/2019 Next Visit: 04/16/2020  Okay to refill per Dr. Corliss Skains.

## 2020-03-12 ENCOUNTER — Other Ambulatory Visit: Payer: Self-pay | Admitting: *Deleted

## 2020-03-12 ENCOUNTER — Other Ambulatory Visit: Payer: Self-pay

## 2020-03-12 ENCOUNTER — Ambulatory Visit (HOSPITAL_COMMUNITY)
Admission: RE | Admit: 2020-03-12 | Discharge: 2020-03-12 | Disposition: A | Discharge status: Home / Self Care | Payer: Medicare Other | Source: Ambulatory Visit | Attending: Rheumatology | Admitting: Rheumatology

## 2020-03-12 DIAGNOSIS — H209 Unspecified iridocyclitis: Secondary | ICD-10-CM

## 2020-03-12 DIAGNOSIS — D869 Sarcoidosis, unspecified: Secondary | ICD-10-CM

## 2020-03-12 MED ORDER — DIPHENHYDRAMINE HCL 25 MG PO CAPS: 25.0000 mg | ORAL_CAPSULE | ORAL | Status: DC

## 2020-03-12 MED ORDER — ACETAMINOPHEN 325 MG PO TABS: 650.0000 mg | ORAL_TABLET | ORAL | Status: DC

## 2020-03-12 MED ORDER — SODIUM CHLORIDE 0.9 % IV SOLN
3.0000 mg/kg | INTRAVENOUS | Status: DC
  Administered 2020-03-12: 200 mg via INTRAVENOUS
  Filled 2020-03-12: qty 20

## 2020-03-15 ENCOUNTER — Other Ambulatory Visit: Payer: Self-pay | Admitting: Rheumatology

## 2020-03-15 NOTE — Telephone Encounter (Signed)
Last Visit:11/16/2019 Next Visit:04/16/2020 Labs: 01/23/2020 Glucose is elevated-131. Rest of CMP WNL.  Current Dose per office note on 11/16/2019: methotrexate 10 tablets by mouth once weekly   Okay to refill per Dr. Corliss Skains

## 2020-04-08 NOTE — Progress Notes (Signed)
Office Visit Note  Patient: Renee Jones             Date of Birth: [birth year]           MRN: [id number]             PCP: Doreene Nest, NP Referring: Doreene Nest, NP Visit Date: 04/17/2020 Occupation: @GUAROCC @  Subjective:  Right thumb pain   History of Present Illness: Renee Jones is a 69 y.o. female with history of uveitis, sarcoidosis, and osteoarthritis.  Patient is on Remicade 3 mg/kg infusions every 6 weeks, methotrexate 8 tablets by mouth once weekly, and folic acid 2 mg by mouth daily.  She has not missed any doses of Remicade or methotrexate recently.  She is tolerating both medications.  She has not had any recent infections.  She denies any signs or symptoms of uveitis or sarcoidosis flare.  She is not having any eye pain or photophobia at this time.  She is not experiencing any shortness of breath or coughing at this time.  She has not noticed any enlarged lymph nodes.  She has not seen her pulmonologist in several years due to clinically doing well.  She reports that her knee pain has improved since having Visco gel injections in March 2021.  She presents today with right thumb pain.  She requested a cortisone injection.  Activities of Daily Living:  Patient reports morning stiffness for 5  minutes.   Patient Reports nocturnal pain.  Difficulty dressing/grooming: Denies Difficulty climbing stairs: Reports Difficulty getting out of chair: Denies Difficulty using hands for taps, buttons, cutlery, and/or writing: Reports  Review of Systems  Constitutional: Negative for fatigue.  HENT: Negative for mouth sores, mouth dryness and nose dryness.   Eyes: Positive for pain, itching and dryness. Negative for visual disturbance.  Respiratory: Negative for cough, hemoptysis, shortness of breath and difficulty breathing.   Cardiovascular: Negative for chest pain, palpitations, hypertension and swelling in legs/feet.  Gastrointestinal: Positive for  blood in stool. Negative for constipation and diarrhea.  Endocrine: Negative for increased urination.  Genitourinary: Negative for difficulty urinating and painful urination.  Musculoskeletal: Positive for arthralgias, joint pain and morning stiffness. Negative for joint swelling, myalgias, muscle weakness, muscle tenderness and myalgias.  Skin: Negative for color change, pallor, rash, hair loss, nodules/bumps, redness, skin tightness, ulcers and sensitivity to sunlight.  Allergic/Immunologic: Negative for susceptible to infections.  Neurological: Negative for dizziness, numbness, headaches, memory loss and weakness.  Hematological: Negative for bruising/bleeding tendency and swollen glands.  Psychiatric/Behavioral: Negative for depressed mood, confusion and sleep disturbance. The patient is not nervous/anxious.     PMFS History:  Patient Active Problem List   Diagnosis Date Noted  . Prediabetes 12/21/2018  . High risk medications (not anticoagulants) long-term use 01/13/2017  . GERD (gastroesophageal reflux disease) 09/14/2016  . Bronchial spasm 09/14/2016  . Glaucoma 09/14/2016  . Detached retina 09/14/2016  . Osteoarthritis of both hands 09/14/2016  . Osteoarthritis of both knees 09/14/2016  . DDD (degenerative disc disease), lumbar 09/14/2016  . Uveitis 06/22/2016  . SVT (supraventricular tachycardia) (HCC) 08/28/2015  . Essential hypertension 08/28/2015  . Diastolic dysfunction 08/28/2015  . Sarcoidosis 09/30/2012  . Pulmonary nodules 09/30/2012  . Tachycardia 09/30/2012  . Sarcoid arthropathy 09/30/2012    Past Medical History:  Diagnosis Date  . Allergy   . Asthma   . Bronchial spasm 09/14/2016  . DDD (degenerative disc disease), lumbar 09/14/2016  . Detached retina 09/14/2016  . Diastolic dysfunction  08/28/2015  . Essential hypertension 08/28/2015  . GERD (gastroesophageal reflux disease)   . Glaucoma 09/14/2016  . Neuromuscular disorder ([place])   . Osteoarthritis of  both hands 09/14/2016  . Osteoarthritis of both knees 09/14/2016  . Sarcoidosis    with +lymph node biopsy  . SVT (supraventricular tachycardia) ([place]) 08/28/2015  . Tachycardia   . Uveitis     Family History  Adopted: Yes  Problem Relation Age of Onset  . Scleroderma Daughter    Past Surgical History:  Procedure Laterality Date  . CATARACT EXTRACTION, BILATERAL  2018  . CYSTECTOMY    . EYE SURGERY Bilateral 2018   cataract removal   . LUMBAR SPINE SURGERY  2009  . ROTATOR CUFF REPAIR Right 2011  . ROTATOR CUFF REPAIR Left 06/2018  . [address]  . TOTAL ABDOMINAL HYSTERECTOMY  1984   Social History   Social History Narrative   Widower.   Retired. Worked as a Education officer, museum.   Moved from [place].   Immunization History  Administered Date(s) Administered  . DTaP 07/26/2010  . Influenza Split 07/19/2014  . Influenza,inj,Quad PF,6+ Mos 07/19/2016, 12/21/2018  . Influenza-Unspecified 07/09/2019  . Pneumococcal Polysaccharide-23 09/19/2012  . Pneumococcal-Unspecified 07/26/2012, 01/18/2015  . Td 03/31/2010  . Tdap 05/07/2010  . Varicella 09/19/2012  . Zoster 07/26/2012     Objective: Vital Signs: BP 121/74 (BP Location: Right Arm, Patient Position: Sitting, Cuff Size: Normal)   Pulse 63   Resp 14   Ht 5' 3.5" (1.613 m)   Wt 142 lb (64.4 kg)   BMI 24.76 kg/m    Physical Exam Vitals and nursing note reviewed.  Constitutional:      Appearance: She is well-developed.  HENT:     Head: Normocephalic and atraumatic.  Eyes:     Conjunctiva/sclera: Conjunctivae normal.  Pulmonary:     Effort: Pulmonary effort is normal.  Abdominal:     General: Bowel sounds are normal.     Palpations: Abdomen is soft.  Musculoskeletal:     Cervical back: Normal range of motion.  Lymphadenopathy:     Cervical: No cervical adenopathy.  Skin:    General: Skin is warm and dry.     Capillary Refill: Capillary refill takes less than 2 seconds.   Neurological:     Mental Status: She is alert and oriented to person, place, and time.  Psychiatric:        Behavior: Behavior normal.      Musculoskeletal Exam: C-spine, thoracic spine, and lumbar spine good ROM.  Shoulder joints, elbow joints, wrist joints, MCPs, PIPs, and DIPs good ROM with no synovitis.  Right CMC joint tenderness. Knee joints have good ROM with no discomfort.  No warmth or effusion of knee joints noted.  Ankle joints good ROM with no discomfort.  No warmth or effusion of ankle joints noted.  CDAI Exam: CDAI Score: -- Patient Global: --; Provider Global: -- Swollen: --; Tender: -- Joint Exam 04/17/2020   No joint exam has been documented for this visit   There is currently no information documented on the homunculus. Go to the Rheumatology activity and complete the homunculus joint exam.  Investigation: No additional findings.  Imaging: No results found.  Recent Labs: Lab Results  Component Value Date   WBC 7.0 01/23/2020   HGB 13.6 01/23/2020   PLT 303 01/23/2020   NA 139 01/23/2020   K 4.0 01/23/2020   CL 108 01/23/2020   CO2 22 01/23/2020   GLUCOSE  131 (H) 01/23/2020   BUN 16 01/23/2020   CREATININE 0.95 01/23/2020   BILITOT 0.8 01/23/2020   ALKPHOS 52 01/23/2020   AST 21 01/23/2020   ALT 16 01/23/2020   PROT 6.7 01/23/2020   ALBUMIN 4.0 01/23/2020   CALCIUM 8.9 01/23/2020   GFRAA >60 01/23/2020   QFTBGOLD Negative 06/16/2017   QFTBGOLDPLUS Negative 08/08/2019    Speciality Comments: REMICADE 3 mg/kg x 6 weeks   Procedures:  Hand/UE Inj: R thumb A1 for trigger finger on 04/17/2020 4:03 PM Indications: pain Details: 27 G needle, ultrasound-guided radial approach Medications: 0.3 mL lidocaine 1 %; 10 mg triamcinolone acetonide 40 MG/ML Aspirate: 0 mL Outcome: tolerated well, no immediate complications Procedure, treatment alternatives, risks and benefits explained, specific risks discussed. Consent was given by the patient.  Immediately prior to procedure a time out was called to verify the correct patient, procedure, equipment, support staff and site/side marked as required. Patient was prepped and draped in the usual sterile fashion.     Allergies: Augmentin [amoxicillin-pot clavulanate], Codeine, Erythromycin, Sulfa antibiotics, and Tramadol   Assessment / Plan:     Visit Diagnoses: Uveitis: She has not had any signs or symptoms of a uveitis flare recently.  She is not experiencing any eye pain, conjunctival and injection, or photophobia at this time.  She is clinically doing well on Remicade 3 mg/kg IV infusions every 6 weeks, methotrexate 10 tablets by mouth once weekly, and folic acid 2 mg by mouth daily.  She will continue on the current treatment regimen.  She was advised to notify us if she starts to develop signs or symptoms of a flare.  She will follow-up in the office in 5 months.  Sarcoidosis - She was evaluated by Dr. Tiffiany Gearing on 02/24/2017 and according to his note there was no evidence of pulmonary sarcoid.  Chest x-ray on 02/24/2017 impression: Interstitium appears unremarkable and no evidence of adenopathy was noted.  She has not had any new or worsening pulmonary symptoms recently.  She is not experiencing any shortness of breath or cough at this time.  She has not noticed any enlarged lymph nodes.  She has not had any recent rashes.  She will continue on the current treatment regimen.  She is advised to notify us if she develops any new or worsening pulmonary symptoms.  High risk medications (not anticoagulants) long-term use - Remicade IV infusion 3 mg/kg every 6 weeks, methotrexate 2.5 mg 10 tablets every 7 days, and folic acid 1 mg 2 tablets daily.  CBC and CMP were drawn on 01/23/2020.  TB gold was negative on 08/08/2019.  Pulmonary nodules: Chest x-ray on 02/24/2017 impression: No edema or consolidation.  Interstitium appears unremarkable.  No evident adenopathy.  Primary osteoarthritis of both hands:  She has PIP and DIP thickening consistent with osteoarthritis of both hands.  She has right CMC joint tenderness on exam today.  We discussed the use of a right CMC joint brace.  She also has a right trigger thumb and requested a cortisone injection today.  She tolerated the procedure well.  The procedure note was completed above.  Trigger thumb, right thumb: She has been experiencing right thumb pain for the past several months.  She has been wearing a brace and has tried icing.  She requested a cortisone injection today.  She tolerated the ultrasound guided injection.  Procedure note completed above.  Splint provided in the office.   Primary osteoarthritis of both knees: She has good range of motion  of both knee joints on exam.  No warmth or effusion was noted.  She has noticed a significant improvement in her knee joint pain since having Visco gel injections in March 2021.  DDD (degenerative disc disease), lumbar: She is not experiencing any lower back pain at this time.  She has no symptoms of radiculopathy.  S/P left rotator cuff repair: Doing well.  She has good range of motion with no discomfort at this time.  History of repair of right rotator cuff: Doing well.  She has good range of motion with no discomfort at this time.  Other medical conditions are listed as follows:   SVT (supraventricular tachycardia) (HCC)  Essential hypertension  Gastroesophageal reflux disease without esophagitis  Orders: Orders Placed This Encounter  Procedures  . Hand/UE Inj: R thumb A1   No orders of the defined types were placed in this encounter.   Follow-Up Instructions: Return in about 5 months (around 09/17/2020) for Uveitis, Sarcoidosis.   Sheppard Evens  I examined and evaluated the patient with Sherron Ales PA.  Patient had no synovitis on my examination today.  She continues to have some discomfort in her hands due to underlying osteoarthritis.  She had her right thumb flexor  tenosynovitis which has been painful.  After informed consent was obtained per patient's request right trigger thumb was injected with cortisone as described above.  She tolerated the procedure well.  The plan of care was discussed as noted above.  Pollyann Savoy, MD  Note - This record has been created using Animal nutritionist.  Chart creation errors have been sought, but may not always  have been located. Such creation errors do not reflect on  the standard of medical care.

## 2020-04-16 ENCOUNTER — Ambulatory Visit: Payer: Medicare Other | Admitting: Physician Assistant

## 2020-04-17 ENCOUNTER — Ambulatory Visit (INDEPENDENT_AMBULATORY_CARE_PROVIDER_SITE_OTHER): Payer: Medicare Other | Admitting: Rheumatology

## 2020-04-17 ENCOUNTER — Encounter: Payer: Self-pay | Admitting: Rheumatology

## 2020-04-17 ENCOUNTER — Other Ambulatory Visit: Payer: Self-pay

## 2020-04-17 VITALS — BP 121/74 | HR 63 | Resp 14 | Ht 63.5 in | Wt 142.0 lb

## 2020-04-17 DIAGNOSIS — R918 Other nonspecific abnormal finding of lung field: Secondary | ICD-10-CM

## 2020-04-17 DIAGNOSIS — M5136 Other intervertebral disc degeneration, lumbar region: Secondary | ICD-10-CM

## 2020-04-17 DIAGNOSIS — D869 Sarcoidosis, unspecified: Secondary | ICD-10-CM | POA: Diagnosis not present

## 2020-04-17 DIAGNOSIS — Z79899 Other long term (current) drug therapy: Secondary | ICD-10-CM

## 2020-04-17 DIAGNOSIS — K219 Gastro-esophageal reflux disease without esophagitis: Secondary | ICD-10-CM

## 2020-04-17 DIAGNOSIS — M170 Bilateral primary osteoarthritis of knee: Secondary | ICD-10-CM

## 2020-04-17 DIAGNOSIS — I471 Supraventricular tachycardia: Secondary | ICD-10-CM

## 2020-04-17 DIAGNOSIS — M19042 Primary osteoarthritis, left hand: Secondary | ICD-10-CM

## 2020-04-17 DIAGNOSIS — H209 Unspecified iridocyclitis: Secondary | ICD-10-CM

## 2020-04-17 DIAGNOSIS — I10 Essential (primary) hypertension: Secondary | ICD-10-CM

## 2020-04-17 DIAGNOSIS — M65311 Trigger thumb, right thumb: Secondary | ICD-10-CM

## 2020-04-17 DIAGNOSIS — M19041 Primary osteoarthritis, right hand: Secondary | ICD-10-CM

## 2020-04-17 DIAGNOSIS — Z9889 Other specified postprocedural states: Secondary | ICD-10-CM

## 2020-04-17 MED ORDER — TRIAMCINOLONE ACETONIDE 40 MG/ML IJ SUSP
10.0000 mg | INTRAMUSCULAR | Status: AC | PRN
  Administered 2020-04-17: 10 mg

## 2020-04-17 MED ORDER — LIDOCAINE HCL 1 % IJ SOLN
0.3000 mL | INTRAMUSCULAR | Status: AC | PRN
  Administered 2020-04-17: .3 mL

## 2020-04-23 ENCOUNTER — Encounter (HOSPITAL_COMMUNITY): Payer: Medicare Other

## 2020-04-30 ENCOUNTER — Ambulatory Visit (HOSPITAL_COMMUNITY)
Admission: RE | Admit: 2020-04-30 | Discharge: 2020-04-30 | Disposition: A | Discharge status: Home / Self Care | Payer: Medicare Other | Source: Ambulatory Visit | Attending: Rheumatology | Admitting: Rheumatology

## 2020-04-30 ENCOUNTER — Other Ambulatory Visit: Payer: Self-pay

## 2020-04-30 DIAGNOSIS — D869 Sarcoidosis, unspecified: Secondary | ICD-10-CM | POA: Diagnosis not present

## 2020-04-30 DIAGNOSIS — H209 Unspecified iridocyclitis: Secondary | ICD-10-CM | POA: Diagnosis not present

## 2020-04-30 LAB — CBC
HCT: 43.1 % (ref 36.0–46.0)
HCT: 43 % (ref 36.0–46.0)
Hemoglobin: 14.2 g/dL (ref 12.0–15.0)
Hemoglobin: 14.2 g/dL (ref 12.0–15.0)
MCH: 30.9 pg (ref 26.0–34.0)
MCH: 30.8 pg (ref 26.0–34.0)
MCHC: 32.9 g/dL (ref 30.0–36.0)
MCHC: 33 g/dL (ref 30.0–36.0)
MCV: 93.7 fL (ref 80.0–100.0)
MCV: 93.3 fL (ref 80.0–100.0)
Platelets: 278 10*3/uL (ref 150–400)
Platelets: 281 10*3/uL (ref 150–400)
RBC: 4.6 MIL/uL (ref 3.87–5.11)
RBC: 4.61 MIL/uL (ref 3.87–5.11)
RDW: 14.1 % (ref 11.5–15.5)
RDW: 14.3 % (ref 11.5–15.5)
WBC: 5.9 10*3/uL (ref 4.0–10.5)
WBC: 5.8 10*3/uL (ref 4.0–10.5)
nRBC: 0 % (ref 0.0–0.2)
nRBC: 0 % (ref 0.0–0.2)

## 2020-04-30 LAB — COMPREHENSIVE METABOLIC PANEL
ALT: 31 U/L (ref 0–44)
AST: 43 U/L — ABNORMAL HIGH (ref 15–41)
Albumin: 3.9 g/dL (ref 3.5–5.0)
Alkaline Phosphatase: 59 U/L (ref 38–126)
Anion gap: 12 (ref 5–15)
BUN: 15 mg/dL (ref 8–23)
CO2: 22 mmol/L (ref 22–32)
Calcium: 9 mg/dL (ref 8.9–10.3)
Chloride: 106 mmol/L (ref 98–111)
Creatinine, Ser: 1.21 mg/dL — ABNORMAL HIGH (ref 0.44–1.00)
GFR calc Af Amer: 53 mL/min — ABNORMAL LOW (ref >60)
GFR calc non Af Amer: 46 mL/min — ABNORMAL LOW (ref >60)
Glucose, Bld: 122 mg/dL — ABNORMAL HIGH (ref 70–99)
Potassium: 4.6 mmol/L (ref 3.5–5.1)
Sodium: 140 mmol/L (ref 135–145)
Total Bilirubin: 0.3 mg/dL (ref 0.3–1.2)
Total Protein: 6.4 g/dL — ABNORMAL LOW (ref 6.5–8.1)

## 2020-04-30 LAB — DIFFERENTIAL
Abs Immature Granulocytes: 0.01 10*3/uL (ref 0.00–0.07)
Basophils Absolute: 0 10*3/uL (ref 0.0–0.1)
Basophils Relative: 0 %
Eosinophils Absolute: 0 10*3/uL (ref 0.0–0.5)
Eosinophils Relative: 0 %
Immature Granulocytes: 0 %
Lymphocytes Relative: 39 %
Lymphs Abs: 2.3 10*3/uL (ref 0.7–4.0)
Monocytes Absolute: 0.4 10*3/uL (ref 0.1–1.0)
Monocytes Relative: 7 %
Neutro Abs: 3.1 10*3/uL (ref 1.7–7.7)
Neutrophils Relative %: 54 %

## 2020-04-30 MED ORDER — SODIUM CHLORIDE 0.9 % IV SOLN
3.0000 mg/kg | INTRAVENOUS | Status: DC
  Administered 2020-04-30: 200 mg via INTRAVENOUS
  Filled 2020-04-30: qty 20

## 2020-04-30 MED ORDER — DIPHENHYDRAMINE HCL 25 MG PO CAPS: 25.0000 mg | ORAL_CAPSULE | ORAL | Status: DC

## 2020-04-30 MED ORDER — ACETAMINOPHEN 325 MG PO TABS: 650.0000 mg | ORAL_TABLET | ORAL | Status: DC

## 2020-04-30 NOTE — Progress Notes (Signed)
Patient needs CBC with differential with all blood draws.  Please call to see if they can add differential to the CBC.

## 2020-04-30 NOTE — Progress Notes (Signed)
CBC with differential is normal.  Elevated creatinine and elevated LFTs noted.  Patient is on methotrexate 10 tablets p.o. weekly.  Please advise patient to reduce methotrexate to 8 tablets p.o. weekly.  She should discontinue Aleve or any other NSAIDs.  Please advise her to recheck her CMP with GFR in 1 month.  Advise to increase water intake

## 2020-05-01 ENCOUNTER — Other Ambulatory Visit: Payer: Self-pay | Admitting: *Deleted

## 2020-05-01 MED ORDER — METHOTREXATE 2.5 MG PO TABS
20.0000 mg | ORAL_TABLET | ORAL | 0 refills | Status: DC
Start: 2020-05-01 — End: 2020-08-26

## 2020-05-01 NOTE — Telephone Encounter (Signed)
-----   Message from Pollyann Savoy, MD sent at 04/30/2020  1:05 PM EDT ----- Patient needs CBC with differential with all blood draws.  Please call to see if they can add differential to the CBC.

## 2020-05-02 ENCOUNTER — Telehealth: Payer: Self-pay | Admitting: *Deleted

## 2020-05-02 DIAGNOSIS — Z79899 Other long term (current) drug therapy: Secondary | ICD-10-CM

## 2020-05-02 NOTE — Telephone Encounter (Signed)
Patient states Tuesday after her infusion and for the last few days she has has the odor of ammonia in her nostrils. Patient states she has not used anything new. Patient states she did just return from [facility]. Patient states she has had a slight headache and she does not typically headache. Patient was to know if her labs could explain any of this. CBC with differential is normal. Elevated creatinine and elevated LFTs noted. Patient states has increased her water intake and the ammonia smell has slightly improved. Please advise.

## 2020-05-02 NOTE — Telephone Encounter (Signed)
Please advise the patient to follow up with PCP or urgent care for further evaluation of the headache/ammonia odor she is experiencing.   Dr. Corliss Skains would like the patient to hold MTX and repeat lab work in 2 weeks.

## 2020-05-03 DIAGNOSIS — U071 COVID-19: Secondary | ICD-10-CM | POA: Diagnosis not present

## 2020-05-03 NOTE — Telephone Encounter (Signed)
Patient advised  to follow up with PCP or urgent care for further evaluation of the headache/ammonia odor she is experiencing.   Dr. Corliss Skains would like the patient to hold MTX and repeat lab work in 2 weeks.

## 2020-05-03 NOTE — Addendum Note (Signed)
Addended by: Henriette Combs on: 05/03/2020 09:18 AM   Modules accepted: Orders

## 2020-05-07 NOTE — Addendum Note (Signed)
Addended by: Verlin Fester C on: 05/07/2020 09:25 AM   Modules accepted: Orders

## 2020-05-07 NOTE — Progress Notes (Signed)
Updated CBC to CBC with diff x 3 occurrences.

## 2020-05-20 ENCOUNTER — Other Ambulatory Visit: Payer: Self-pay | Admitting: *Deleted

## 2020-05-20 DIAGNOSIS — Z79899 Other long term (current) drug therapy: Secondary | ICD-10-CM

## 2020-05-20 LAB — CBC WITH DIFFERENTIAL/PLATELET
Absolute Monocytes: 632 cells/uL (ref 200–950)
Basophils Absolute: 32 cells/uL (ref 0–200)
Basophils Relative: 0.4 %
Eosinophils Absolute: 47 cells/uL (ref 15–500)
Eosinophils Relative: 0.6 %
HCT: 40.5 % (ref 35.0–45.0)
Hemoglobin: 13.9 g/dL (ref 11.7–15.5)
Lymphs Abs: 2899 cells/uL (ref 850–3900)
MCH: 31.2 pg (ref 27.0–33.0)
MCHC: 34.3 g/dL (ref 32.0–36.0)
MCV: 91 fL (ref 80.0–100.0)
MPV: 10.5 fL (ref 7.5–12.5)
Monocytes Relative: 8 %
Neutro Abs: 4290 cells/uL (ref 1500–7800)
Neutrophils Relative %: 54.3 %
Platelets: 419 10*3/uL — ABNORMAL HIGH (ref 140–400)
RBC: 4.45 10*6/uL (ref 3.80–5.10)
RDW: 13.7 % (ref 11.0–15.0)
Total Lymphocyte: 36.7 %
WBC: 7.9 10*3/uL (ref 3.8–10.8)

## 2020-05-20 LAB — COMPLETE METABOLIC PANEL WITH GFR
AG Ratio: 1.8 (calc) (ref 1.0–2.5)
ALT: 13 U/L (ref 6–29)
AST: 16 U/L (ref 10–35)
Albumin: 4.4 g/dL (ref 3.6–5.1)
Alkaline phosphatase (APISO): 78 U/L (ref 37–153)
BUN: 13 mg/dL (ref 7–25)
CO2: 29 mmol/L (ref 20–32)
Calcium: 9.4 mg/dL (ref 8.6–10.4)
Chloride: 106 mmol/L (ref 98–110)
Creat: 0.87 mg/dL (ref 0.50–0.99)
GFR, Est African American: 79 mL/min/{1.73_m2} (ref >=60)
GFR, Est Non African American: 68 mL/min/{1.73_m2} (ref >=60)
Globulin: 2.5 g/dL (calc) (ref 1.9–3.7)
Glucose, Bld: 91 mg/dL (ref 65–139)
Potassium: 4.3 mmol/L (ref 3.5–5.3)
Sodium: 140 mmol/L (ref 135–146)
Total Bilirubin: 0.6 mg/dL (ref 0.2–1.2)
Total Protein: 6.9 g/dL (ref 6.1–8.1)

## 2020-05-21 NOTE — Progress Notes (Signed)
Platelet count is mildly elevated. Rest of CBC WNL.  CMP WNL.

## 2020-06-08 ENCOUNTER — Other Ambulatory Visit: Payer: Self-pay | Admitting: Rheumatology

## 2020-06-11 ENCOUNTER — Other Ambulatory Visit: Payer: Self-pay

## 2020-06-11 ENCOUNTER — Ambulatory Visit (HOSPITAL_COMMUNITY)
Admission: RE | Admit: 2020-06-11 | Discharge: 2020-06-11 | Disposition: A | Discharge status: Home / Self Care | Payer: Medicare Other | Source: Ambulatory Visit | Attending: Rheumatology | Admitting: Rheumatology

## 2020-06-11 DIAGNOSIS — H209 Unspecified iridocyclitis: Secondary | ICD-10-CM | POA: Insufficient documentation

## 2020-06-11 DIAGNOSIS — D869 Sarcoidosis, unspecified: Secondary | ICD-10-CM | POA: Insufficient documentation

## 2020-06-11 MED ORDER — DIPHENHYDRAMINE HCL 25 MG PO CAPS: 25.0000 mg | ORAL_CAPSULE | ORAL | Status: DC

## 2020-06-11 MED ORDER — ACETAMINOPHEN 325 MG PO TABS: 650.0000 mg | ORAL_TABLET | ORAL | Status: DC

## 2020-06-11 MED ORDER — SODIUM CHLORIDE 0.9 % IV SOLN
3.0000 mg/kg | INTRAVENOUS | Status: DC
  Administered 2020-06-11: 200 mg via INTRAVENOUS
  Filled 2020-06-11: qty 20

## 2020-06-12 DIAGNOSIS — K5900 Constipation, unspecified: Secondary | ICD-10-CM | POA: Diagnosis not present

## 2020-06-12 DIAGNOSIS — K625 Hemorrhage of anus and rectum: Secondary | ICD-10-CM | POA: Diagnosis not present

## 2020-06-17 ENCOUNTER — Other Ambulatory Visit: Payer: Self-pay | Admitting: Pharmacist

## 2020-06-17 DIAGNOSIS — Z79899 Other long term (current) drug therapy: Secondary | ICD-10-CM

## 2020-06-17 DIAGNOSIS — H209 Unspecified iridocyclitis: Secondary | ICD-10-CM

## 2020-06-17 DIAGNOSIS — D869 Sarcoidosis, unspecified: Secondary | ICD-10-CM

## 2020-06-17 NOTE — Progress Notes (Signed)
Next infusion scheduled for 07/23/20 and due for updated orders.  Last Visit: 04/17/20 Next Visit: 09/17/20 Labs: 05/20/20 TB Gold: 08/08/19  Orders placed for Remicade 3 mg/kg every 6 weeks x 2 doses along with premedication of Tylenol and Benadryl.  Standing CBC/CMP orders placed.  Order placed for TB gold.  Verlin Fester, PharmD, [place], CPP Clinical Specialty Pharmacist (Rheumatology and Pulmonology)  06/17/2020 11:46 AM

## 2020-06-20 ENCOUNTER — Other Ambulatory Visit: Payer: Self-pay | Admitting: Primary Care

## 2020-06-20 DIAGNOSIS — Z1159 Encounter for screening for other viral diseases: Secondary | ICD-10-CM

## 2020-06-20 DIAGNOSIS — I10 Essential (primary) hypertension: Secondary | ICD-10-CM

## 2020-06-20 DIAGNOSIS — R7303 Prediabetes: Secondary | ICD-10-CM

## 2020-06-25 ENCOUNTER — Other Ambulatory Visit (INDEPENDENT_AMBULATORY_CARE_PROVIDER_SITE_OTHER): Payer: Medicare Other

## 2020-06-25 ENCOUNTER — Other Ambulatory Visit: Payer: Self-pay

## 2020-06-25 DIAGNOSIS — I10 Essential (primary) hypertension: Secondary | ICD-10-CM

## 2020-06-25 DIAGNOSIS — R7303 Prediabetes: Secondary | ICD-10-CM

## 2020-06-25 DIAGNOSIS — Z1159 Encounter for screening for other viral diseases: Secondary | ICD-10-CM | POA: Diagnosis not present

## 2020-06-25 LAB — LIPID PANEL
Cholesterol: 198 mg/dL (ref 0–200)
HDL: 48.4 mg/dL (ref >39.00)
LDL Cholesterol: 110 mg/dL — ABNORMAL HIGH (ref 0–99)
NonHDL: 149.95
Total CHOL/HDL Ratio: 4
Triglycerides: 200 mg/dL — ABNORMAL HIGH (ref 0.0–149.0)
VLDL: 40 mg/dL (ref 0.0–40.0)

## 2020-06-25 LAB — HEMOGLOBIN A1C: Hgb A1c MFr Bld: 6.2 % (ref 4.6–6.5)

## 2020-06-26 LAB — HEPATITIS C ANTIBODY
Hepatitis C Ab: NONREACTIVE
SIGNAL TO CUT-OFF: 0.01 (ref <1.00)

## 2020-07-15 DIAGNOSIS — H40013 Open angle with borderline findings, low risk, bilateral: Secondary | ICD-10-CM | POA: Diagnosis not present

## 2020-07-15 DIAGNOSIS — H0102B Squamous blepharitis left eye, upper and lower eyelids: Secondary | ICD-10-CM | POA: Diagnosis not present

## 2020-07-15 DIAGNOSIS — H1045 Other chronic allergic conjunctivitis: Secondary | ICD-10-CM | POA: Diagnosis not present

## 2020-07-15 DIAGNOSIS — H0102A Squamous blepharitis right eye, upper and lower eyelids: Secondary | ICD-10-CM | POA: Diagnosis not present

## 2020-07-15 DIAGNOSIS — H0288B Meibomian gland dysfunction left eye, upper and lower eyelids: Secondary | ICD-10-CM | POA: Diagnosis not present

## 2020-07-15 DIAGNOSIS — H16223 Keratoconjunctivitis sicca, not specified as Sjogren's, bilateral: Secondary | ICD-10-CM | POA: Diagnosis not present

## 2020-07-15 DIAGNOSIS — H0288A Meibomian gland dysfunction right eye, upper and lower eyelids: Secondary | ICD-10-CM | POA: Diagnosis not present

## 2020-07-17 ENCOUNTER — Other Ambulatory Visit: Payer: Self-pay | Admitting: Rheumatology

## 2020-07-17 NOTE — Telephone Encounter (Signed)
Last Visit: 04/17/2020 Next Visit: 09/17/2020  Okay to refill per Dr. Corliss Skains

## 2020-07-23 ENCOUNTER — Encounter (HOSPITAL_COMMUNITY)
Admission: RE | Admit: 2020-07-23 | Discharge: 2020-07-23 | Disposition: A | Discharge status: Home / Self Care | Payer: Medicare Other | Source: Ambulatory Visit | Attending: Rheumatology | Admitting: Rheumatology

## 2020-07-23 ENCOUNTER — Other Ambulatory Visit: Payer: Self-pay

## 2020-07-23 DIAGNOSIS — H209 Unspecified iridocyclitis: Secondary | ICD-10-CM | POA: Insufficient documentation

## 2020-07-23 DIAGNOSIS — Z79899 Other long term (current) drug therapy: Secondary | ICD-10-CM | POA: Diagnosis not present

## 2020-07-23 DIAGNOSIS — D869 Sarcoidosis, unspecified: Secondary | ICD-10-CM | POA: Diagnosis not present

## 2020-07-23 LAB — CBC WITH DIFFERENTIAL/PLATELET
Abs Immature Granulocytes: 0.01 10*3/uL (ref 0.00–0.07)
Basophils Absolute: 0 10*3/uL (ref 0.0–0.1)
Basophils Relative: 0 %
Eosinophils Absolute: 0.1 10*3/uL (ref 0.0–0.5)
Eosinophils Relative: 1 %
HCT: 38.8 % (ref 36.0–46.0)
Hemoglobin: 13 g/dL (ref 12.0–15.0)
Immature Granulocytes: 0 %
Lymphocytes Relative: 47 %
Lymphs Abs: 2.6 10*3/uL (ref 0.7–4.0)
MCH: 30.6 pg (ref 26.0–34.0)
MCHC: 33.5 g/dL (ref 30.0–36.0)
MCV: 91.3 fL (ref 80.0–100.0)
Monocytes Absolute: 0.3 10*3/uL (ref 0.1–1.0)
Monocytes Relative: 6 %
Neutro Abs: 2.6 10*3/uL (ref 1.7–7.7)
Neutrophils Relative %: 46 %
Platelets: 278 10*3/uL (ref 150–400)
RBC: 4.25 MIL/uL (ref 3.87–5.11)
RDW: 13.2 % (ref 11.5–15.5)
WBC: 5.6 10*3/uL (ref 4.0–10.5)
nRBC: 0 % (ref 0.0–0.2)

## 2020-07-23 LAB — COMPREHENSIVE METABOLIC PANEL
ALT: 16 U/L (ref 0–44)
AST: 21 U/L (ref 15–41)
Albumin: 4 g/dL (ref 3.5–5.0)
Alkaline Phosphatase: 59 U/L (ref 38–126)
Anion gap: 9 (ref 5–15)
BUN: 14 mg/dL (ref 8–23)
CO2: 24 mmol/L (ref 22–32)
Calcium: 9.2 mg/dL (ref 8.9–10.3)
Chloride: 107 mmol/L (ref 98–111)
Creatinine, Ser: 0.94 mg/dL (ref 0.44–1.00)
GFR calc non Af Amer: >60 mL/min (ref >60)
Glucose, Bld: 106 mg/dL — ABNORMAL HIGH (ref 70–99)
Potassium: 3.8 mmol/L (ref 3.5–5.1)
Sodium: 140 mmol/L (ref 135–145)
Total Bilirubin: 0.8 mg/dL (ref 0.3–1.2)
Total Protein: 6.6 g/dL (ref 6.5–8.1)

## 2020-07-23 MED ORDER — DIPHENHYDRAMINE HCL 25 MG PO CAPS: 25.0000 mg | ORAL_CAPSULE | Freq: Once | ORAL | Status: DC

## 2020-07-23 MED ORDER — ACETAMINOPHEN 325 MG PO TABS: 650.0000 mg | ORAL_TABLET | Freq: Once | ORAL | Status: DC

## 2020-07-23 MED ORDER — SODIUM CHLORIDE 0.9 % IV SOLN
3.0000 mg/kg | INTRAVENOUS | Status: DC
  Administered 2020-07-23: 200 mg via INTRAVENOUS
  Filled 2020-07-23: qty 20

## 2020-07-23 NOTE — Progress Notes (Signed)
CMP normal

## 2020-07-25 LAB — QUANTIFERON-TB GOLD PLUS (RQFGPL)
QuantiFERON Mitogen Value: >10 IU/mL
QuantiFERON Nil Value: 0.08 IU/mL
QuantiFERON TB1 Ag Value: 0.13 IU/mL
QuantiFERON TB2 Ag Value: 0.12 IU/mL

## 2020-07-25 LAB — QUANTIFERON-TB GOLD PLUS: QuantiFERON-TB Gold Plus: NEGATIVE

## 2020-08-16 ENCOUNTER — Other Ambulatory Visit: Payer: Self-pay | Admitting: Primary Care

## 2020-08-17 ENCOUNTER — Other Ambulatory Visit: Payer: Self-pay | Admitting: Primary Care

## 2020-08-17 DIAGNOSIS — K219 Gastro-esophageal reflux disease without esophagitis: Secondary | ICD-10-CM

## 2020-08-22 ENCOUNTER — Encounter: Payer: Self-pay | Admitting: Primary Care

## 2020-08-22 DIAGNOSIS — Z1231 Encounter for screening mammogram for malignant neoplasm of breast: Secondary | ICD-10-CM | POA: Diagnosis not present

## 2020-08-25 ENCOUNTER — Other Ambulatory Visit: Payer: Self-pay | Admitting: Rheumatology

## 2020-08-26 NOTE — Telephone Encounter (Signed)
Last Visit: 04/17/2020 Next Visit: 09/17/2020 Labs: 07/23/2020: CMP normal CBC WNL.  Current Dose per office note on 04/17/2020: methotrexate 10 tablets by mouth once weekly Dx: Uveitis  Okay to refill per Dr. Corliss Skains

## 2020-08-30 ENCOUNTER — Telehealth: Payer: Self-pay

## 2020-08-30 NOTE — Telephone Encounter (Signed)
Patient called requesting prescription refills of Zofran and Methotrexate to be sent to [facility] at [facility] in [place].

## 2020-08-30 NOTE — Telephone Encounter (Signed)
Prescriptions for Zofran and Methotrexate were sent to the pharmacy on 08/26/2020. Left message to advise patient prescriptions were sent to the pharmacy on 08/26/2020.

## 2020-09-03 ENCOUNTER — Other Ambulatory Visit: Payer: Self-pay

## 2020-09-03 ENCOUNTER — Ambulatory Visit (HOSPITAL_COMMUNITY)
Admission: RE | Admit: 2020-09-03 | Discharge: 2020-09-03 | Disposition: A | Discharge status: Home / Self Care | Payer: Medicare Other | Source: Ambulatory Visit | Attending: Rheumatology | Admitting: Rheumatology

## 2020-09-03 DIAGNOSIS — D869 Sarcoidosis, unspecified: Secondary | ICD-10-CM | POA: Diagnosis not present

## 2020-09-03 DIAGNOSIS — H209 Unspecified iridocyclitis: Secondary | ICD-10-CM | POA: Insufficient documentation

## 2020-09-03 MED ORDER — ACETAMINOPHEN 325 MG PO TABS: 650.0000 mg | ORAL_TABLET | Freq: Once | ORAL | Status: DC

## 2020-09-03 MED ORDER — SODIUM CHLORIDE 0.9 % IV SOLN
3.0000 mg/kg | INTRAVENOUS | Status: DC
  Administered 2020-09-03: 200 mg via INTRAVENOUS
  Filled 2020-09-03: qty 20

## 2020-09-03 MED ORDER — DIPHENHYDRAMINE HCL 25 MG PO CAPS: 25.0000 mg | ORAL_CAPSULE | Freq: Once | ORAL | Status: DC

## 2020-09-04 NOTE — Progress Notes (Signed)
Office Visit Note  Patient: Renee Jones             Date of Birth: [birth year]           MRN: [id number]             PCP: Doreene Nest, NP Referring: Doreene Nest, NP Visit Date: 09/17/2020 Occupation: @GUAROCC @  Subjective:  Medication management.   History of Present Illness: Renee Jones is a 69 y.o. female with history of sarcoidosis and osteoarthritis.  However she has been on combination of Remicade and methotrexate.  Her last infusion was 2 weeks ago.  She denies any joint inflammation.  She has not had any pulmonary symptoms.  She continues to have some stiffness in her hands and knee joints due to osteoarthritis.  She had a good response to trigger thumb injection which was performed on April 17, 2020.  He had viscosupplementation junctions to her bilateral knee joints in March 2021.  She had good response to Visco supplement injections.  She uses topical Voltaren gel occasionally.  Activities of Daily Living:  Patient reports morning stiffness for 5 minutes.   Patient Denies nocturnal pain.  Difficulty dressing/grooming: Denies Difficulty climbing stairs: Reports Difficulty getting out of chair: Reports Difficulty using hands for taps, buttons, cutlery, and/or writing: Reports  Review of Systems  Constitutional: Negative for fatigue.  HENT: Negative for mouth sores, mouth dryness and nose dryness.   Eyes: Positive for dryness. Negative for pain, itching and visual disturbance.  Respiratory: Negative for cough, hemoptysis, shortness of breath and difficulty breathing.   Cardiovascular: Negative for chest pain, palpitations and swelling in legs/feet.  Gastrointestinal: Negative for abdominal pain, blood in stool, constipation and diarrhea.  Endocrine: Negative for increased urination.  Genitourinary: Negative for painful urination.  Musculoskeletal: Positive for arthralgias, joint pain and morning stiffness. Negative for joint swelling,  myalgias, muscle weakness, muscle tenderness and myalgias.  Skin: Negative for color change, rash and redness.  Allergic/Immunologic: Negative for susceptible to infections.  Neurological: Positive for dizziness. Negative for numbness, headaches, memory loss and weakness.  Hematological: Negative for swollen glands.  Psychiatric/Behavioral: Positive for sleep disturbance. Negative for confusion.    PMFS History:  Patient Active Problem List   Diagnosis Date Noted  . Prediabetes 12/21/2018  . High risk medications (not anticoagulants) long-term use 01/13/2017  . GERD (gastroesophageal reflux disease) 09/14/2016  . Bronchial spasm 09/14/2016  . Glaucoma 09/14/2016  . Detached retina 09/14/2016  . Osteoarthritis of both hands 09/14/2016  . Osteoarthritis of both knees 09/14/2016  . DDD (degenerative disc disease), lumbar 09/14/2016  . Uveitis 06/22/2016  . SVT (supraventricular tachycardia) (HCC) 08/28/2015  . Essential hypertension 08/28/2015  . Diastolic dysfunction 08/28/2015  . Sarcoidosis 09/30/2012  . Pulmonary nodules 09/30/2012  . Tachycardia 09/30/2012  . Sarcoid arthropathy 09/30/2012    Past Medical History:  Diagnosis Date  . Allergy   . Asthma   . Bronchial spasm 09/14/2016  . DDD (degenerative disc disease), lumbar 09/14/2016  . Detached retina 09/14/2016  . Diastolic dysfunction 08/28/2015  . Essential hypertension 08/28/2015  . GERD (gastroesophageal reflux disease)   . Glaucoma 09/14/2016  . Neuromuscular disorder (HCC)   . Osteoarthritis of both hands 09/14/2016  . Osteoarthritis of both knees 09/14/2016  . Sarcoidosis    with +lymph node biopsy  . SVT (supraventricular tachycardia) (HCC) 08/28/2015  . Tachycardia   . Uveitis     Family History  Adopted: Yes  Problem Relation Age of Onset  .  Scleroderma Daughter    Past Surgical History:  Procedure Laterality Date  . CATARACT EXTRACTION, BILATERAL  2018  . CYSTECTOMY    . EYE SURGERY Bilateral  2018   cataract removal   . LUMBAR SPINE SURGERY  2009  . ROTATOR CUFF REPAIR Right 2011  . ROTATOR CUFF REPAIR Left 06/2018  . TONSILLECTOMY AND ADENOIDECTOMY  1962  . TOTAL ABDOMINAL HYSTERECTOMY  1984   Social History   Social History Narrative   Widower.   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   Immunization History  Administered Date(s) Administered  . DTaP 07/26/2010  . Influenza Split 07/19/2014  . Influenza,inj,Quad PF,6+ Mos 07/19/2016, 12/21/2018  . Influenza-Unspecified 07/09/2019  . Pneumococcal Polysaccharide-23 09/19/2012  . Pneumococcal-Unspecified 07/26/2012, 01/18/2015  . Td 03/31/2010  . Tdap 05/07/2010  . Varicella 09/19/2012  . Zoster 07/26/2012     Objective: Vital Signs: BP (!) 104/57 (BP Location: Left Arm, Patient Position: Sitting, Cuff Size: Small)   Pulse 66   Ht 5' 3.5" (1.613 m)   Wt 136 lb 6.4 oz (61.9 kg)   BMI 23.78 kg/m    Physical Exam Vitals and nursing note reviewed.  Constitutional:      Appearance: She is well-developed.  HENT:     Head: Normocephalic and atraumatic.  Eyes:     Conjunctiva/sclera: Conjunctivae normal.  Cardiovascular:     Rate and Rhythm: Normal rate and regular rhythm.     Heart sounds: Normal heart sounds.  Pulmonary:     Effort: Pulmonary effort is normal.     Breath sounds: Normal breath sounds.  Abdominal:     General: Bowel sounds are normal.     Palpations: Abdomen is soft.  Musculoskeletal:     Cervical back: Normal range of motion.  Lymphadenopathy:     Cervical: No cervical adenopathy.  Skin:    General: Skin is warm and dry.     Capillary Refill: Capillary refill takes less than 2 seconds.  Neurological:     Mental Status: She is alert and oriented to person, place, and time.  Psychiatric:        Behavior: Behavior normal.      Musculoskeletal Exam: C-spine, and lumbar skin with good range of motion.  She has some discomfort range of motion of her lumbar spine.   Shoulder joints, elbow joints, wrist joints, MCPs PIPs and DIPs with good range of motion with no synovitis.  She has some DIP and DIP thickening.  Hip joints, knee joints, ankles, MTPs with good range of motion with no synovitis.  CDAI Exam: CDAI Score: -- Patient Global: --; Provider Global: -- Swollen: --; Tender: -- Joint Exam 09/17/2020   No joint exam has been documented for this visit   There is currently no information documented on the homunculus. Go to the Rheumatology activity and complete the homunculus joint exam.  Investigation: No additional findings.  Imaging: No results found.  Recent Labs: Lab Results  Component Value Date   WBC 5.6 07/23/2020   HGB 13.0 07/23/2020   PLT 278 07/23/2020   NA 140 07/23/2020   K 3.8 07/23/2020   CL 107 07/23/2020   CO2 24 07/23/2020   GLUCOSE 106 (H) 07/23/2020   BUN 14 07/23/2020   CREATININE 0.94 07/23/2020   BILITOT 0.8 07/23/2020   ALKPHOS 59 07/23/2020   AST 21 07/23/2020   ALT 16 07/23/2020   PROT 6.6 07/23/2020   ALBUMIN 4.0 07/23/2020   CALCIUM 9.2  07/23/2020   GFRAA 79 05/20/2020   QFTBGOLD Negative 06/16/2017   QFTBGOLDPLUS Negative 07/23/2020    Speciality Comments: REMICADE 3 mg/kg x 6 weeks-ACY 06/17/20   Procedures:  No procedures performed Allergies: Augmentin [amoxicillin-pot clavulanate], Codeine, Erythromycin, Sulfa antibiotics, and Tramadol   Assessment / Plan:     Visit Diagnoses: Renee Jones has had no recurrence of uveitis since she has been on combination therapy.  Sarcoidosis - evaluated by Dr. Amri Gearing on 02/24/2017 and according to his note there was no evidence of pulmonary sarcoid.  High risk medications (not anticoagulants) long-term use - Remicade IV infusion 3 mg/kg every 6 weeks, methotrexate 2.5 mg 10 tablets every 7 days, and folic acid 1 mg 2 tablets daily.  Her last labs were on July 23, 2020 which were within normal limits.  She gets labs with her infusions.  As patient is  clinically doing well I discussed with her decreasing methotrexate to 9 tablets p.o.weekly for 3 months and if she does well then we will decrease it to 8 tablets p.o. weekly.  She was in agreement.  If her symptoms flare then we can go to her previous dose.  Pulmonary nodules - Chest x-ray on 02/24/2017 impression: No edema or consolidation.  Interstitium appears unremarkable.  No evident adenopathy.  Primary osteoarthritis of both hands-she continues to have some discomfort in her hands.  Primary osteoarthritis of both knees-she had good response to Visco supplement injections.  Last injection was in March 2021.  DDD (degenerative disc disease), lumbar-she continues to have some lower back pain.  S/P left rotator cuff repair-doing well.  History of repair of right rotator cuff-doing well.  SVT (supraventricular tachycardia) (HCC)  Essential hypertension-stable.  Trigger thumb, right thumb-she had good response to the cortisone injection on April 17, 2020.  Gastroesophageal reflux disease without esophagitis  Educated about COVID-19 virus infection-she has not been vaccinated against COVID-19.  She is hesitant to get the infection because she has sarcoidosis.  I explained that there is no contraindication in sarcoidosis.  I have advised her to discuss this further with her PCP as well.  Use of mask, social distancing and hand hygiene was discussed.  Osteoporosis screening-she is postmenopausal.  We will schedule a DEXA scan.  Resistive exercises will be important to prevent osteoporosis  Postmenopausal  Orders: No orders of the defined types were placed in this encounter.  No orders of the defined types were placed in this encounter.    Follow-Up Instructions: Return in about 3 months (around 12/16/2020) for Sarcoidosis, Osteoarthritis.   Pollyann Savoy, MD  Note - This record has been created using Animal nutritionist.  Chart creation errors have been sought, but may not always   have been located. Such creation errors do not reflect on  the standard of medical care.

## 2020-09-05 ENCOUNTER — Telehealth: Payer: Self-pay

## 2020-09-05 NOTE — Telephone Encounter (Addendum)
Patient called stating she needs a new authorization before she can schedule her Remicade infusion for December.  Patient is requesting a return call.

## 2020-09-06 ENCOUNTER — Other Ambulatory Visit: Payer: Self-pay | Admitting: Physician Assistant

## 2020-09-06 DIAGNOSIS — H209 Unspecified iridocyclitis: Secondary | ICD-10-CM

## 2020-09-06 DIAGNOSIS — D869 Sarcoidosis, unspecified: Secondary | ICD-10-CM

## 2020-09-06 NOTE — Progress Notes (Signed)
Next infusion scheduled for Remicade and due for updated orders. Diagnosis: Uveitis and sarcoidosis   Last Visit: 11/16/19 Next Visit: 09/17/20  Labs: CBC and CMP updated on 07/23/20  TB Gold: Negative on 07/23/20  Orders placed for Remicade x 2 doses along with premedication of Tylenol and Benadryl.  Standing CBC/CMP orders placed.  Orders placed and reviewed with Dr. Corliss Skains prior to signing.   Sherron Ales, PA-C

## 2020-09-06 NOTE — Telephone Encounter (Signed)
Placed orders and reviewed with Dr. Deveshwar prior to signing.

## 2020-09-17 ENCOUNTER — Other Ambulatory Visit: Payer: Self-pay

## 2020-09-17 ENCOUNTER — Encounter: Payer: Self-pay | Admitting: Rheumatology

## 2020-09-17 ENCOUNTER — Ambulatory Visit (INDEPENDENT_AMBULATORY_CARE_PROVIDER_SITE_OTHER): Payer: Medicare Other | Admitting: Rheumatology

## 2020-09-17 ENCOUNTER — Telehealth: Payer: Self-pay | Admitting: Rheumatology

## 2020-09-17 VITALS — BP 104/57 | HR 66 | Ht 63.5 in | Wt 136.4 lb

## 2020-09-17 DIAGNOSIS — Z780 Asymptomatic menopausal state: Secondary | ICD-10-CM

## 2020-09-17 DIAGNOSIS — K219 Gastro-esophageal reflux disease without esophagitis: Secondary | ICD-10-CM | POA: Diagnosis not present

## 2020-09-17 DIAGNOSIS — R918 Other nonspecific abnormal finding of lung field: Secondary | ICD-10-CM

## 2020-09-17 DIAGNOSIS — Z7189 Other specified counseling: Secondary | ICD-10-CM

## 2020-09-17 DIAGNOSIS — M19041 Primary osteoarthritis, right hand: Secondary | ICD-10-CM

## 2020-09-17 DIAGNOSIS — I10 Essential (primary) hypertension: Secondary | ICD-10-CM | POA: Diagnosis not present

## 2020-09-17 DIAGNOSIS — M65311 Trigger thumb, right thumb: Secondary | ICD-10-CM | POA: Diagnosis not present

## 2020-09-17 DIAGNOSIS — M5136 Other intervertebral disc degeneration, lumbar region: Secondary | ICD-10-CM

## 2020-09-17 DIAGNOSIS — Z13820 Encounter for screening for osteoporosis: Secondary | ICD-10-CM

## 2020-09-17 DIAGNOSIS — I471 Supraventricular tachycardia: Secondary | ICD-10-CM

## 2020-09-17 DIAGNOSIS — M170 Bilateral primary osteoarthritis of knee: Secondary | ICD-10-CM | POA: Diagnosis not present

## 2020-09-17 DIAGNOSIS — D869 Sarcoidosis, unspecified: Secondary | ICD-10-CM

## 2020-09-17 DIAGNOSIS — Z9889 Other specified postprocedural states: Secondary | ICD-10-CM | POA: Diagnosis not present

## 2020-09-17 DIAGNOSIS — H209 Unspecified iridocyclitis: Secondary | ICD-10-CM

## 2020-09-17 DIAGNOSIS — M19042 Primary osteoarthritis, left hand: Secondary | ICD-10-CM

## 2020-09-17 DIAGNOSIS — Z79899 Other long term (current) drug therapy: Secondary | ICD-10-CM

## 2020-09-17 NOTE — Addendum Note (Signed)
Addended by: Ellen Henri on: 09/17/2020 03:51 PM   Modules accepted: Orders

## 2020-09-17 NOTE — Patient Instructions (Addendum)
COVID-19 vaccine recommendations:   COVID-19 vaccine is recommended for everyone (unless you are allergic to a vaccine component), even if you are on a medication that suppresses your immune system.   If you are on Methotrexate, Cellcept (mycophenolate), Rinvoq, Harriette Ohara, and Olumiant- hold the medication for 1 week after each vaccine. Hold Methotrexate for 2 weeks after the single dose COVID-19 vaccine.   Do not take Tylenol or any anti-inflammatory medications (NSAIDs) 24 hours prior to the COVID-19 vaccination.   There is no direct evidence about the efficacy of the COVID-19 vaccine in individuals who are on medications that suppress the immune system.   Even if you are fully vaccinated, and you are on any medications that suppress your immune system, please continue to wear a mask, maintain at least six feet social distance and practice hand hygiene.   If you develop a COVID-19 infection, please contact your PCP or our office to determine if you need monoclonal antibody infusion.  The booster vaccine is now available for immunocompromised patients.   Please see the following web sites for updated information.   [email or web address]

## 2020-09-17 NOTE — Telephone Encounter (Signed)
Patient is due for next infusion 10/08/2020. Please send in orders.

## 2020-09-19 ENCOUNTER — Other Ambulatory Visit: Payer: Self-pay | Admitting: Physician Assistant

## 2020-09-19 NOTE — Progress Notes (Signed)
Opened in error

## 2020-09-19 NOTE — Telephone Encounter (Signed)
Orders placed on 09/06/20.

## 2020-09-23 DIAGNOSIS — K625 Hemorrhage of anus and rectum: Secondary | ICD-10-CM | POA: Diagnosis not present

## 2020-10-07 ENCOUNTER — Other Ambulatory Visit: Payer: Self-pay | Admitting: Primary Care

## 2020-10-07 DIAGNOSIS — E785 Hyperlipidemia, unspecified: Secondary | ICD-10-CM

## 2020-10-15 ENCOUNTER — Other Ambulatory Visit: Payer: Self-pay

## 2020-10-15 ENCOUNTER — Encounter (HOSPITAL_COMMUNITY)
Admission: RE | Admit: 2020-10-15 | Discharge: 2020-10-15 | Disposition: A | Discharge status: Home / Self Care | Payer: Medicare Other | Source: Ambulatory Visit | Attending: Rheumatology | Admitting: Rheumatology

## 2020-10-15 DIAGNOSIS — H209 Unspecified iridocyclitis: Secondary | ICD-10-CM | POA: Insufficient documentation

## 2020-10-15 DIAGNOSIS — D869 Sarcoidosis, unspecified: Secondary | ICD-10-CM | POA: Diagnosis not present

## 2020-10-15 DIAGNOSIS — Z79899 Other long term (current) drug therapy: Secondary | ICD-10-CM | POA: Diagnosis not present

## 2020-10-15 LAB — CBC WITH DIFFERENTIAL/PLATELET
Abs Immature Granulocytes: 0 10*3/uL (ref 0.00–0.07)
Basophils Absolute: 0 10*3/uL (ref 0.0–0.1)
Basophils Relative: 0 %
Eosinophils Absolute: 0.1 10*3/uL (ref 0.0–0.5)
Eosinophils Relative: 2 %
HCT: 39.4 % (ref 36.0–46.0)
Hemoglobin: 13.8 g/dL (ref 12.0–15.0)
Immature Granulocytes: 0 %
Lymphocytes Relative: 49 %
Lymphs Abs: 3.5 10*3/uL (ref 0.7–4.0)
MCH: 31.4 pg (ref 26.0–34.0)
MCHC: 35 g/dL (ref 30.0–36.0)
MCV: 89.7 fL (ref 80.0–100.0)
Monocytes Absolute: 0.4 10*3/uL (ref 0.1–1.0)
Monocytes Relative: 6 %
Neutro Abs: 3.1 10*3/uL (ref 1.7–7.7)
Neutrophils Relative %: 43 %
Platelets: 331 10*3/uL (ref 150–400)
RBC: 4.39 MIL/uL (ref 3.87–5.11)
RDW: 13.5 % (ref 11.5–15.5)
WBC: 7.2 10*3/uL (ref 4.0–10.5)
nRBC: 0 % (ref 0.0–0.2)

## 2020-10-15 LAB — COMPREHENSIVE METABOLIC PANEL
ALT: 21 U/L (ref 0–44)
AST: 34 U/L (ref 15–41)
Albumin: 4 g/dL (ref 3.5–5.0)
Alkaline Phosphatase: 71 U/L (ref 38–126)
Anion gap: 9 (ref 5–15)
BUN: 13 mg/dL (ref 8–23)
CO2: 24 mmol/L (ref 22–32)
Calcium: 9.1 mg/dL (ref 8.9–10.3)
Chloride: 107 mmol/L (ref 98–111)
Creatinine, Ser: 0.9 mg/dL (ref 0.44–1.00)
GFR, Estimated: >60 mL/min (ref >60)
Glucose, Bld: 133 mg/dL — ABNORMAL HIGH (ref 70–99)
Potassium: 4.7 mmol/L (ref 3.5–5.1)
Sodium: 140 mmol/L (ref 135–145)
Total Bilirubin: 0.9 mg/dL (ref 0.3–1.2)
Total Protein: 6.8 g/dL (ref 6.5–8.1)

## 2020-10-15 MED ORDER — DIPHENHYDRAMINE HCL 25 MG PO CAPS: 25.0000 mg | ORAL_CAPSULE | ORAL | Status: DC

## 2020-10-15 MED ORDER — SODIUM CHLORIDE 0.9 % IV SOLN
3.0000 mg/kg | INTRAVENOUS | Status: DC
  Administered 2020-10-15: 200 mg via INTRAVENOUS
  Filled 2020-10-15: qty 20

## 2020-10-15 MED ORDER — ACETAMINOPHEN 325 MG PO TABS: 650.0000 mg | ORAL_TABLET | ORAL | Status: DC

## 2020-10-15 NOTE — Progress Notes (Signed)
CBC and CMP are normal.  Glucose is elevated, probably not a fasting sample.

## 2020-10-23 ENCOUNTER — Other Ambulatory Visit (INDEPENDENT_AMBULATORY_CARE_PROVIDER_SITE_OTHER): Payer: Medicare Other

## 2020-10-23 ENCOUNTER — Other Ambulatory Visit: Payer: Self-pay

## 2020-10-23 DIAGNOSIS — E785 Hyperlipidemia, unspecified: Secondary | ICD-10-CM | POA: Diagnosis not present

## 2020-10-23 LAB — LIPID PANEL
Cholesterol: 218 mg/dL — ABNORMAL HIGH (ref 0–200)
HDL: 50.7 mg/dL (ref >39.00)
NonHDL: 166.9
Total CHOL/HDL Ratio: 4
Triglycerides: 240 mg/dL — ABNORMAL HIGH (ref 0.0–149.0)
VLDL: 48 mg/dL — ABNORMAL HIGH (ref 0.0–40.0)

## 2020-10-23 LAB — LDL CHOLESTEROL, DIRECT: Direct LDL: 134 mg/dL

## 2020-10-24 DIAGNOSIS — R7303 Prediabetes: Secondary | ICD-10-CM

## 2020-10-24 DIAGNOSIS — E785 Hyperlipidemia, unspecified: Secondary | ICD-10-CM

## 2020-10-24 MED ORDER — ROSUVASTATIN CALCIUM 5 MG PO TABS: 5.0000 mg | ORAL_TABLET | Freq: Every day | ORAL | 1 refills | Status: DC

## 2020-10-25 ENCOUNTER — Other Ambulatory Visit: Payer: Self-pay

## 2020-10-25 ENCOUNTER — Other Ambulatory Visit (INDEPENDENT_AMBULATORY_CARE_PROVIDER_SITE_OTHER): Payer: Medicare Other

## 2020-10-25 DIAGNOSIS — R7303 Prediabetes: Secondary | ICD-10-CM

## 2020-10-25 LAB — POCT GLYCOSYLATED HEMOGLOBIN (HGB A1C): Hemoglobin A1C: 5.8 % — AB (ref 4.0–5.6)

## 2020-11-01 DIAGNOSIS — Z780 Asymptomatic menopausal state: Secondary | ICD-10-CM | POA: Diagnosis not present

## 2020-11-01 LAB — HM DEXA SCAN

## 2020-11-05 ENCOUNTER — Telehealth: Payer: Self-pay | Admitting: *Deleted

## 2020-11-05 NOTE — Telephone Encounter (Signed)
Received DEXA results from [facility].  Date of Scan: 11/01/2020 Lowest T-score and site measured: -1.0 Right Femoral Neck Significant changes in BMD and site measured (5% and above):0   Current Regimen: n/a  Recommendation: Follow up DEXA in 5 years.  Patient advised.

## 2020-11-12 ENCOUNTER — Encounter: Payer: Self-pay | Admitting: Primary Care

## 2020-11-21 ENCOUNTER — Other Ambulatory Visit: Payer: Self-pay | Admitting: Rheumatology

## 2020-11-21 NOTE — Telephone Encounter (Signed)
Last Visit: 09/17/2020 Next Visit: 12/17/2020 Labs: 10/15/2020, CBC and CMP are normal. Glucose is elevated, probably not a fasting sample.  Current Dose per office note 09/17/2020, methotrexate 2.5 mg 10 tablets every 7 days DX: Uveitis-  Okay to refill MTX?

## 2020-11-26 ENCOUNTER — Other Ambulatory Visit: Payer: Self-pay

## 2020-11-26 ENCOUNTER — Ambulatory Visit (HOSPITAL_COMMUNITY)
Admission: RE | Admit: 2020-11-26 | Discharge: 2020-11-26 | Disposition: A | Discharge status: Home / Self Care | Payer: Medicare Other | Source: Ambulatory Visit | Attending: Rheumatology | Admitting: Rheumatology

## 2020-11-26 DIAGNOSIS — H209 Unspecified iridocyclitis: Secondary | ICD-10-CM | POA: Diagnosis not present

## 2020-11-26 DIAGNOSIS — D869 Sarcoidosis, unspecified: Secondary | ICD-10-CM | POA: Insufficient documentation

## 2020-11-26 LAB — CBC WITH DIFFERENTIAL/PLATELET
Abs Immature Granulocytes: 0.01 10*3/uL (ref 0.00–0.07)
Basophils Absolute: 0 10*3/uL (ref 0.0–0.1)
Basophils Relative: 1 %
Eosinophils Absolute: 0.1 10*3/uL (ref 0.0–0.5)
Eosinophils Relative: 2 %
HCT: 40.1 % (ref 36.0–46.0)
Hemoglobin: 13.8 g/dL (ref 12.0–15.0)
Immature Granulocytes: 0 %
Lymphocytes Relative: 46 %
Lymphs Abs: 3 10*3/uL (ref 0.7–4.0)
MCH: 31.8 pg (ref 26.0–34.0)
MCHC: 34.4 g/dL (ref 30.0–36.0)
MCV: 92.4 fL (ref 80.0–100.0)
Monocytes Absolute: 0.4 10*3/uL (ref 0.1–1.0)
Monocytes Relative: 6 %
Neutro Abs: 3 10*3/uL (ref 1.7–7.7)
Neutrophils Relative %: 45 %
Platelets: 316 10*3/uL (ref 150–400)
RBC: 4.34 MIL/uL (ref 3.87–5.11)
RDW: 13.5 % (ref 11.5–15.5)
WBC: 6.5 10*3/uL (ref 4.0–10.5)
nRBC: 0 % (ref 0.0–0.2)

## 2020-11-26 LAB — COMPREHENSIVE METABOLIC PANEL
ALT: 15 U/L (ref 0–44)
AST: 20 U/L (ref 15–41)
Albumin: 3.9 g/dL (ref 3.5–5.0)
Alkaline Phosphatase: 65 U/L (ref 38–126)
Anion gap: 10 (ref 5–15)
BUN: 12 mg/dL (ref 8–23)
CO2: 22 mmol/L (ref 22–32)
Calcium: 9 mg/dL (ref 8.9–10.3)
Chloride: 110 mmol/L (ref 98–111)
Creatinine, Ser: 0.93 mg/dL (ref 0.44–1.00)
GFR, Estimated: >60 mL/min (ref >60)
Glucose, Bld: 132 mg/dL — ABNORMAL HIGH (ref 70–99)
Potassium: 4.1 mmol/L (ref 3.5–5.1)
Sodium: 142 mmol/L (ref 135–145)
Total Bilirubin: 0.9 mg/dL (ref 0.3–1.2)
Total Protein: 6.4 g/dL — ABNORMAL LOW (ref 6.5–8.1)

## 2020-11-26 MED ORDER — SODIUM CHLORIDE 0.9 % IV SOLN
3.0000 mg/kg | INTRAVENOUS | Status: DC
  Administered 2020-11-26: 200 mg via INTRAVENOUS
  Filled 2020-11-26: qty 20

## 2020-11-26 MED ORDER — DIPHENHYDRAMINE HCL 25 MG PO CAPS: 25.0000 mg | ORAL_CAPSULE | ORAL | Status: DC

## 2020-11-26 MED ORDER — ACETAMINOPHEN 325 MG PO TABS: 650.0000 mg | ORAL_TABLET | ORAL | Status: DC

## 2020-11-26 NOTE — Progress Notes (Signed)
Glucose is 132.  Total borderline is borderline low.  Rest of CMP WNL.  CBC WNL.

## 2020-11-27 ENCOUNTER — Other Ambulatory Visit: Payer: Self-pay | Admitting: Rheumatology

## 2020-11-27 NOTE — Telephone Encounter (Signed)
Last Visit: 09/17/2020 Next Visit: 12/17/2020  Current Dose per office note on 09/17/2020, folic acid 1 mg 2 tablets daily Dx: Uveitis  Last Fill:07/17/2020   Okay to refill Folic Acid?

## 2020-12-04 NOTE — Progress Notes (Deleted)
Office Visit Note  Patient: Renee Jones             Date of Birth: 1951-06-28           MRN: [id number]             PCP: Doreene Nest, NP Referring: Doreene Nest, NP Visit Date: 12/17/2020 Occupation: @GUAROCC @  Subjective:    History of Present Illness: Renee Jones is a 70 y.o. female with history of uveitis, sarcoidosis, osteoarthritis, and DDD. She is on remicade 3 mg/kg IV infusions every 6 weeks, methotrexate 10 tablets by mouth once weekly, and folic acid 2 mg by mouth daily. Her most recent infusion was on 11/26/20.    Eye dr.? Pulm?   CBC and CMP updated on 11/26/20. She receives routine lab work with infusions.  TB gold negative on 08/02/20 and will continue to be monitored yearly.    Activities of Daily Living:  Patient reports morning stiffness for *** {minute/hour:19697}.   Patient {ACTIONS;DENIES/REPORTS:21021675::"Denies"} nocturnal pain.  Difficulty dressing/grooming: {ACTIONS;DENIES/REPORTS:21021675::"Denies"} Difficulty climbing stairs: {ACTIONS;DENIES/REPORTS:21021675::"Denies"} Difficulty getting out of chair: {ACTIONS;DENIES/REPORTS:21021675::"Denies"} Difficulty using hands for taps, buttons, cutlery, and/or writing: {ACTIONS;DENIES/REPORTS:21021675::"Denies"}  No Rheumatology ROS completed.   PMFS History:  Patient Active Problem List   Diagnosis Date Noted  . Prediabetes 12/21/2018  . High risk medications (not anticoagulants) long-term use 01/13/2017  . GERD (gastroesophageal reflux disease) 09/14/2016  . Bronchial spasm 09/14/2016  . Glaucoma 09/14/2016  . Detached retina 09/14/2016  . Osteoarthritis of both hands 09/14/2016  . Osteoarthritis of both knees 09/14/2016  . DDD (degenerative disc disease), lumbar 09/14/2016  . Uveitis 06/22/2016  . SVT (supraventricular tachycardia) (HCC) 08/28/2015  . Essential hypertension 08/28/2015  . Diastolic dysfunction 08/28/2015  . Sarcoidosis 09/30/2012  . Pulmonary nodules  09/30/2012  . Tachycardia 09/30/2012  . Sarcoid arthropathy 09/30/2012    Past Medical History:  Diagnosis Date  . Allergy   . Asthma   . Bronchial spasm 09/14/2016  . DDD (degenerative disc disease), lumbar 09/14/2016  . Detached retina 09/14/2016  . Diastolic dysfunction 08/28/2015  . Essential hypertension 08/28/2015  . GERD (gastroesophageal reflux disease)   . Glaucoma 09/14/2016  . Neuromuscular disorder (HCC)   . Osteoarthritis of both hands 09/14/2016  . Osteoarthritis of both knees 09/14/2016  . Sarcoidosis    with +lymph node biopsy  . SVT (supraventricular tachycardia) (HCC) 08/28/2015  . Tachycardia   . Uveitis     Family History  Adopted: Yes  Problem Relation Age of Onset  . Scleroderma Daughter    Past Surgical History:  Procedure Laterality Date  . CATARACT EXTRACTION, BILATERAL  2018  . CYSTECTOMY    . EYE SURGERY Bilateral 2018   cataract removal   . LUMBAR SPINE SURGERY  2009  . ROTATOR CUFF REPAIR Right 2011  . ROTATOR CUFF REPAIR Left 06/2018  . TONSILLECTOMY AND ADENOIDECTOMY  1962  . TOTAL ABDOMINAL HYSTERECTOMY  1984   Social History   Social History Narrative   Widower.   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   Immunization History  Administered Date(s) Administered  . DTaP 07/26/2010  . Influenza Split 07/19/2014  . Influenza,inj,Quad PF,6+ Mos 07/19/2016, 12/21/2018  . Influenza-Unspecified 07/09/2019  . Pneumococcal Polysaccharide-23 09/19/2012  . Pneumococcal-Unspecified 07/26/2012, 01/18/2015  . Td 03/31/2010  . Tdap 05/07/2010  . Varicella 09/19/2012  . Zoster 07/26/2012     Objective: Vital Signs: There were no vitals taken for this visit.  Physical Exam   Musculoskeletal Exam: ***  CDAI Exam: CDAI Score: - Patient Global: -; Provider Global: - Swollen: -; Tender: - Joint Exam 12/17/2020   No joint exam has been documented for this visit   There is currently no information documented on the  homunculus. Go to the Rheumatology activity and complete the homunculus joint exam.  Investigation: No additional findings.  Imaging: No results found.  Recent Labs: Lab Results  Component Value Date   WBC 6.5 11/26/2020   HGB 13.8 11/26/2020   PLT 316 11/26/2020   NA 142 11/26/2020   K 4.1 11/26/2020   CL 110 11/26/2020   CO2 22 11/26/2020   GLUCOSE 132 (H) 11/26/2020   BUN 12 11/26/2020   CREATININE 0.93 11/26/2020   BILITOT 0.9 11/26/2020   ALKPHOS 65 11/26/2020   AST 20 11/26/2020   ALT 15 11/26/2020   PROT 6.4 (L) 11/26/2020   ALBUMIN 3.9 11/26/2020   CALCIUM 9.0 11/26/2020   GFRAA 79 05/20/2020   QFTBGOLD Negative 06/16/2017   QFTBGOLDPLUS Negative 07/23/2020    Speciality Comments: REMICADE 3 mg/kg x 6 weeks-ACY 06/17/20   Procedures:  No procedures performed Allergies: Augmentin [amoxicillin-pot clavulanate], Codeine, Erythromycin, Sulfa antibiotics, and Tramadol   Assessment / Plan:     Visit Diagnoses: No diagnosis found.  Orders: No orders of the defined types were placed in this encounter.  No orders of the defined types were placed in this encounter.   Face-to-face time spent with patient was *** minutes. Greater than 50% of time was spent in counseling and coordination of care.  Follow-Up Instructions: No follow-ups on file.   Ellen Henri, CMA  Note - This record has been created using Animal nutritionist.  Chart creation errors have been sought, but may not always  have been located. Such creation errors do not reflect on  the standard of medical care.

## 2020-12-17 ENCOUNTER — Ambulatory Visit: Payer: Medicare Other | Admitting: Physician Assistant

## 2020-12-17 DIAGNOSIS — M19042 Primary osteoarthritis, left hand: Secondary | ICD-10-CM

## 2020-12-17 DIAGNOSIS — Z79899 Other long term (current) drug therapy: Secondary | ICD-10-CM

## 2020-12-17 DIAGNOSIS — M5136 Other intervertebral disc degeneration, lumbar region: Secondary | ICD-10-CM

## 2020-12-17 DIAGNOSIS — K219 Gastro-esophageal reflux disease without esophagitis: Secondary | ICD-10-CM

## 2020-12-17 DIAGNOSIS — H209 Unspecified iridocyclitis: Secondary | ICD-10-CM

## 2020-12-17 DIAGNOSIS — M65311 Trigger thumb, right thumb: Secondary | ICD-10-CM

## 2020-12-17 DIAGNOSIS — I10 Essential (primary) hypertension: Secondary | ICD-10-CM

## 2020-12-17 DIAGNOSIS — R918 Other nonspecific abnormal finding of lung field: Secondary | ICD-10-CM

## 2020-12-17 DIAGNOSIS — I471 Supraventricular tachycardia: Secondary | ICD-10-CM

## 2020-12-17 DIAGNOSIS — Z9889 Other specified postprocedural states: Secondary | ICD-10-CM

## 2020-12-17 DIAGNOSIS — M170 Bilateral primary osteoarthritis of knee: Secondary | ICD-10-CM

## 2020-12-17 DIAGNOSIS — D869 Sarcoidosis, unspecified: Secondary | ICD-10-CM

## 2020-12-17 NOTE — Progress Notes (Signed)
Office Visit Note  Patient: Renee Jones             Date of Birth: 24-Jul-1951           MRN: [id number]             PCP: Doreene Nest, NP Referring: Doreene Nest, NP Visit Date: 12/19/2020 Occupation: @GUAROCC @  Subjective:  Pain in both hands   History of Present Illness: Renee Jones is a 70 y.o. female with history of uveitis, sarcoidosis, osteoarthritis, and DDD.  She is currently on Remicade IV infusions 3 mg/kg every 6 weeks, methotrexate 2.5 mg 8 tablets every 7 days, and folic acid 1 mg 2 tablets daily.  Her most recent infusion was on 11/26/20.  She denies any uveitis flares.  She is not experiencing any eye pain, redness, or photophobia at this time.  She has an upcoming appointment with her ophthalmologist at the end of March 2022.  She has not noticed any new or worsening symptoms since reducing the dose of methotrexate from 10 tablets to 8 tablets once weekly.  She denies any new or worsening pulmonary symptoms.  She has not had any shortness of breath, pleuritic chest pain, or cough.  She has not followed up with pulmonologist in several years.  She has been experiencing some increased pain and stiffness in both hands with cooler weather temperatures.  She denies any joint swelling.  She has been wearing arthritis compression gloves as needed as well as using Voltaren gel topically as needed for pain relief.  She has intermittent discomfort in the left knee joint which is typically exacerbated by walking on the treadmill.  She denies any swelling or mechanical symptoms in her left knee at this time.  She noticed improvement in her bilateral knee joint discomfort after undergoing Orthovisc injections in March 2021.  In the past she found Hyalgan to be more effective than Orthovisc.  She is not ready to reapply for Visco gel injections at this time.  She denies any lower back pain at this time.  She is not experiencing discomfort in her shoulder joints  currently. She denies any recent infections.  She has not received the COVID-19 vaccines and does not plan to at this time.     Activities of Daily Living:  Patient reports morning stiffness for 3 minutes.   Patient Denies nocturnal pain.  Difficulty dressing/grooming: Denies Difficulty climbing stairs: Reports Difficulty getting out of chair: Denies Difficulty using hands for taps, buttons, cutlery, and/or writing: Reports  Review of Systems  Constitutional: Positive for fatigue.  HENT: Negative for mouth dryness.   Eyes: Positive for dryness.  Respiratory: Negative for shortness of breath.   Cardiovascular: Negative for swelling in legs/feet.  Gastrointestinal: Negative for constipation.  Endocrine: Positive for cold intolerance.  Genitourinary: Negative for difficulty urinating.  Musculoskeletal: Positive for arthralgias, joint pain and morning stiffness.  Skin: Negative for rash.  Allergic/Immunologic: Negative for susceptible to infections.  Neurological: Positive for numbness.  Hematological: Negative for bruising/bleeding tendency.  Psychiatric/Behavioral: Positive for sleep disturbance.    PMFS History:  Patient Active Problem List   Diagnosis Date Noted  . Prediabetes 12/21/2018  . High risk medications (not anticoagulants) long-term use 01/13/2017  . GERD (gastroesophageal reflux disease) 09/14/2016  . Bronchial spasm 09/14/2016  . Glaucoma 09/14/2016  . Detached retina 09/14/2016  . Osteoarthritis of both hands 09/14/2016  . Osteoarthritis of both knees 09/14/2016  . DDD (degenerative disc disease), lumbar 09/14/2016  .  Uveitis 06/22/2016  . SVT (supraventricular tachycardia) (HCC) 08/28/2015  . Essential hypertension 08/28/2015  . Diastolic dysfunction 08/28/2015  . Sarcoidosis 09/30/2012  . Pulmonary nodules 09/30/2012  . Tachycardia 09/30/2012  . Sarcoid arthropathy 09/30/2012    Past Medical History:  Diagnosis Date  . Allergy   . Asthma   .  Bronchial spasm 09/14/2016  . DDD (degenerative disc disease), lumbar 09/14/2016  . Detached retina 09/14/2016  . Diastolic dysfunction 08/28/2015  . Essential hypertension 08/28/2015  . GERD (gastroesophageal reflux disease)   . Glaucoma 09/14/2016  . Neuromuscular disorder (HCC)   . Osteoarthritis of both hands 09/14/2016  . Osteoarthritis of both knees 09/14/2016  . Sarcoidosis    with +lymph node biopsy  . SVT (supraventricular tachycardia) (HCC) 08/28/2015  . Tachycardia   . Uveitis     Family History  Adopted: Yes  Problem Relation Age of Onset  . Scleroderma Daughter    Past Surgical History:  Procedure Laterality Date  . CATARACT EXTRACTION, BILATERAL  2018  . CYSTECTOMY    . EYE SURGERY Bilateral 2018   cataract removal   . LUMBAR SPINE SURGERY  2009  . ROTATOR CUFF REPAIR Right 2011  . ROTATOR CUFF REPAIR Left 06/2018  . TONSILLECTOMY AND ADENOIDECTOMY  1962  . TOTAL ABDOMINAL HYSTERECTOMY  1984   Social History   Social History Narrative   Widower.   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   Immunization History  Administered Date(s) Administered  . DTaP 07/26/2010  . Influenza Split 07/19/2014  . Influenza,inj,Quad PF,6+ Mos 07/19/2016, 12/21/2018  . Influenza-Unspecified 07/09/2019  . Pneumococcal Polysaccharide-23 09/19/2012  . Pneumococcal-Unspecified 07/26/2012, 01/18/2015  . Td 03/31/2010  . Tdap 05/07/2010  . Varicella 09/19/2012  . Zoster 07/26/2012     Objective: Vital Signs: BP 118/62 (BP Location: Left Arm, Patient Position: Sitting, Cuff Size: Normal)   Pulse (!) 50   Resp 15   Ht 5' 3.5" (1.613 m)   Wt 130 lb (59 kg)   BMI 22.67 kg/m    Physical Exam Vitals and nursing note reviewed.  Constitutional:      Appearance: She is well-developed and well-nourished.  HENT:     Head: Normocephalic and atraumatic.  Eyes:     Extraocular Movements: EOM normal.     Conjunctiva/sclera: Conjunctivae normal.      Comments: No conjunctival erythema noted  Cardiovascular:     Pulses: Intact distal pulses.  Pulmonary:     Effort: Pulmonary effort is normal.  Abdominal:     Palpations: Abdomen is soft.  Musculoskeletal:     Cervical back: Normal range of motion.  Skin:    General: Skin is warm and dry.     Capillary Refill: Capillary refill takes less than 2 seconds.  Neurological:     Mental Status: She is alert and oriented to person, place, and time.  Psychiatric:        Mood and Affect: Mood and affect normal.        Behavior: Behavior normal.      Musculoskeletal Exam: C-spine, thoracic spine, lumbar spine have good range of motion with no discomfort.  No midline spinal tenderness or SI joint tenderness.  Shoulder joints, elbow joints, wrist joints, MCPs, PIPs, DIPs have good range of motion with no synovitis.  She has tenderness over the right first, second, and third PIP joints.  Complete fist formation bilaterally.  Hip joints have good range of motion with no discomfort.  No  tenderness over trochanteric bursa bilaterally.  Knee joints have good range of motion with no warmth or effusion.  No calf tightness or tenderness noted.  Ankle joints have good range of motion with no tenderness or inflammation.  CDAI Exam: CDAI Score: -- Patient Global: --; Provider Global: -- Swollen: --; Tender: -- Joint Exam 12/19/2020   No joint exam has been documented for this visit   There is currently no information documented on the homunculus. Go to the Rheumatology activity and complete the homunculus joint exam.  Investigation: No additional findings.  Imaging: No results found.  Recent Labs: Lab Results  Component Value Date   WBC 6.5 11/26/2020   HGB 13.8 11/26/2020   PLT 316 11/26/2020   NA 142 11/26/2020   K 4.1 11/26/2020   CL 110 11/26/2020   CO2 22 11/26/2020   GLUCOSE 132 (H) 11/26/2020   BUN 12 11/26/2020   CREATININE 0.93 11/26/2020   BILITOT 0.9 11/26/2020   ALKPHOS 65  11/26/2020   AST 20 11/26/2020   ALT 15 11/26/2020   PROT 6.4 (L) 11/26/2020   ALBUMIN 3.9 11/26/2020   CALCIUM 9.0 11/26/2020   GFRAA 79 05/20/2020   QFTBGOLD Negative 06/16/2017   QFTBGOLDPLUS Negative 07/23/2020    Speciality Comments: REMICADE 3 mg/kg x 6 weeks-ACY 06/17/20   Procedures:  No procedures performed Allergies: Codeine, Amoxicillin-pot clavulanate, Erythromycin base, Other, Sulfa antibiotics, Tramadol, Tramadol hcl, and Erythromycin   Assessment / Plan:     Visit Diagnoses: Uveitis: She has not had any signs or symptoms of a uveitis flare recently.  She is not experiencing any eye pain, conjunctival erythema, photophobia, or floaters at this time.  She has an upcoming appointment with her ophthalmologist at the end of March 2022.  Overall she is clinically doing well on Remicade IV 3 mg/kg infusions every 6 weeks, methotrexate 8 tablets by mouth once weekly, and folic acid 2 mg by mouth daily.  She has not noticed any new or worsening symptoms since reducing the dose of methotrexate from 10 tablets to 8 tablets weekly after her last office visit on 09/17/20.  She will continue on the current treatment regimen.  She does not need any refills at this time.  She was advised to notify us if she develops signs or symptoms of uveitis flare.  Sarcoidosis - No evidence of pulmonary involvement-Evaluated by Dr. Macey Gearing on 02/24/2017.  She has not had any signs or symptoms of a flare.  She is not experiencing any new or worsening pulmonary symptoms.  High risk medications (not anticoagulants) long-term use - Remicade IV infusion 3 mg/kg every 6 weeks, methotrexate 2.5 mg 8 tablets every 7 days, and folic acid 1 mg 2 tablets daily.  CBC and CMP updated on 11/26/20.  Results were reviewed with the patient today in the office.  She has routine lab work drawn with her infusions. TB gold negative 07/23/20 and will continue to be monitored yearly.   She has not had any recent infections.  She has  not received the COVID-19 vaccine doses and does not plan to at this time.  Patient plans on following up with her PCP to discuss when her next pneumonia vaccine is due. Discussed the importance of holding methotrexate and postponing Remicade infusions if she develops signs or symptoms of an infection and to resume when the infection has completely cleared.  She voiced understanding.  Pulmonary nodules: Chest x-ray on 02/24/2017 impression: No edema or consolidation.  Interstitium appears unremarkable.  No  evident adenopathy.  Primary osteoarthritis of both hands: She has been experiencing increased pain and stiffness in both hands over the past couple of months due to cooler weather temperatures.  She has PIP and DIP thickening consistent with osteoarthritis of both hands.  She has tenderness palpation over the right first, second, and third PIP joints.  No synovitis was noted.  She is able to make a complete fist bilaterally.  She has been using arthritis compression gloves as well as Voltaren gel topically as needed for pain relief.  She is also been performing some hand exercises.  We discussed the importance of joint protection and muscle strengthening.  A handout of hand exercises was provided in the AVS.  We also discussed the list of natural anti-inflammatories which she can start taking.    Primary osteoarthritis of both knees: She has good range of motion of both knee joints on exam.  No warmth or effusion was noted.  She experiences occasional discomfort and stiffness in the left knee joint which is typically exacerbated by walking on the treadmill.  She underwent Orthovisc injections in March 2021 which alleviated her discomfort.  In the past she felt as though Hyalgan had longer lasting results and Orthovisc.  She declined reapplying for Visco gel injections at this time.  She will notify us when she would like to reapply at which time we will try to reapply for Hyalgan injections for both knees.   We discussed the importance of lower extremity muscle strengthening.  She can continue to use Voltaren gel topically as needed for pain relief.  DDD (degenerative disc disease), lumbar: She is not experiencing any lower back pain or stiffness.  She has no symptoms of radiculopathy.  No midline spinal tenderness or SI joint tenderness was noted.  S/P left rotator cuff repair: Doing well.  She has good range of motion with no discomfort at this time.  History of repair of right rotator cuff: Doing well.  She has good range of motion with no discomfort.  No tenderness palpation on exam.  Other medical conditions are listed as follows:  SVT (supraventricular tachycardia) (HCC)  Essential hypertension  Trigger thumb, right thumb: Resolved  Gastroesophageal reflux disease without esophagitis  Orders: No orders of the defined types were placed in this encounter.  No orders of the defined types were placed in this encounter.     Follow-Up Instructions: Return in about 5 months (around 05/21/2021) for Sarcoidosis, uveitis .   Gearldine Bienenstock, PA-C  Note - This record has been created using Dragon software.  Chart creation errors have been sought, but may not always  have been located. Such creation errors do not reflect on  the standard of medical care.

## 2020-12-19 ENCOUNTER — Other Ambulatory Visit: Payer: Self-pay

## 2020-12-19 ENCOUNTER — Ambulatory Visit (INDEPENDENT_AMBULATORY_CARE_PROVIDER_SITE_OTHER): Payer: Medicare Other | Admitting: Physician Assistant

## 2020-12-19 ENCOUNTER — Encounter: Payer: Self-pay | Admitting: Physician Assistant

## 2020-12-19 VITALS — BP 118/62 | HR 50 | Resp 15 | Ht 63.5 in | Wt 130.0 lb

## 2020-12-19 DIAGNOSIS — D869 Sarcoidosis, unspecified: Secondary | ICD-10-CM | POA: Diagnosis not present

## 2020-12-19 DIAGNOSIS — M5136 Other intervertebral disc degeneration, lumbar region: Secondary | ICD-10-CM

## 2020-12-19 DIAGNOSIS — K219 Gastro-esophageal reflux disease without esophagitis: Secondary | ICD-10-CM | POA: Diagnosis not present

## 2020-12-19 DIAGNOSIS — I10 Essential (primary) hypertension: Secondary | ICD-10-CM | POA: Diagnosis not present

## 2020-12-19 DIAGNOSIS — M170 Bilateral primary osteoarthritis of knee: Secondary | ICD-10-CM

## 2020-12-19 DIAGNOSIS — M19042 Primary osteoarthritis, left hand: Secondary | ICD-10-CM

## 2020-12-19 DIAGNOSIS — M19041 Primary osteoarthritis, right hand: Secondary | ICD-10-CM

## 2020-12-19 DIAGNOSIS — I471 Supraventricular tachycardia: Secondary | ICD-10-CM

## 2020-12-19 DIAGNOSIS — H209 Unspecified iridocyclitis: Secondary | ICD-10-CM

## 2020-12-19 DIAGNOSIS — R918 Other nonspecific abnormal finding of lung field: Secondary | ICD-10-CM | POA: Diagnosis not present

## 2020-12-19 DIAGNOSIS — Z9889 Other specified postprocedural states: Secondary | ICD-10-CM

## 2020-12-19 DIAGNOSIS — Z79899 Other long term (current) drug therapy: Secondary | ICD-10-CM

## 2020-12-19 DIAGNOSIS — M65311 Trigger thumb, right thumb: Secondary | ICD-10-CM | POA: Diagnosis not present

## 2020-12-19 NOTE — Patient Instructions (Signed)
Hand Exercises Hand exercises can be helpful for almost anyone. These exercises can strengthen the hands, improve flexibility and movement, and increase blood flow to the hands. These results can make work and daily tasks easier. Hand exercises can be especially helpful for people who have joint pain from arthritis or have nerve damage from overuse (carpal tunnel syndrome). These exercises can also help people who have injured a hand. Exercises Most of these hand exercises are gentle stretching and motion exercises. It is usually safe to do them often throughout the day. Warming up your hands before exercise may help to reduce stiffness. You can do this with gentle massage or by placing your hands in warm water for 10-15 minutes. It is normal to feel some stretching, pulling, tightness, or mild discomfort as you begin new exercises. This will gradually improve. Stop an exercise right away if you feel sudden, severe pain or your pain gets worse. Ask your health care provider which exercises are best for you. Knuckle bend or "claw" fist 1. Stand or sit with your arm, hand, and all five fingers pointed straight up. Make sure to keep your wrist straight during the exercise. 2. Gently bend your fingers down toward your palm until the tips of your fingers are touching the top of your palm. Keep your big knuckle straight and just bend the small knuckles in your fingers. 3. Hold this position for __________ seconds. 4. Straighten (extend) your fingers back to the starting position. Repeat this exercise 5-10 times with each hand. Full finger fist 1. Stand or sit with your arm, hand, and all five fingers pointed straight up. Make sure to keep your wrist straight during the exercise. 2. Gently bend your fingers into your palm until the tips of your fingers are touching the middle of your palm. 3. Hold this position for __________ seconds. 4. Extend your fingers back to the starting position, stretching every  joint fully. Repeat this exercise 5-10 times with each hand. Straight fist 1. Stand or sit with your arm, hand, and all five fingers pointed straight up. Make sure to keep your wrist straight during the exercise. 2. Gently bend your fingers at the big knuckle, where your fingers meet your hand, and the middle knuckle. Keep the knuckle at the tips of your fingers straight and try to touch the bottom of your palm. 3. Hold this position for __________ seconds. 4. Extend your fingers back to the starting position, stretching every joint fully. Repeat this exercise 5-10 times with each hand. Tabletop 1. Stand or sit with your arm, hand, and all five fingers pointed straight up. Make sure to keep your wrist straight during the exercise. 2. Gently bend your fingers at the big knuckle, where your fingers meet your hand, as far down as you can while keeping the small knuckles in your fingers straight. Think of forming a tabletop with your fingers. 3. Hold this position for __________ seconds. 4. Extend your fingers back to the starting position, stretching every joint fully. Repeat this exercise 5-10 times with each hand. Finger spread 1. Place your hand flat on a table with your palm facing down. Make sure your wrist stays straight as you do this exercise. 2. Spread your fingers and thumb apart from each other as far as you can until you feel a gentle stretch. Hold this position for __________ seconds. 3. Bring your fingers and thumb tight together again. Hold this position for __________ seconds. Repeat this exercise 5-10 times with each hand.   Making circles 1. Stand or sit with your arm, hand, and all five fingers pointed straight up. Make sure to keep your wrist straight during the exercise. 2. Make a circle by touching the tip of your thumb to the tip of your index finger. 3. Hold for __________ seconds. Then open your hand wide. 4. Repeat this motion with your thumb and each finger on your  hand. Repeat this exercise 5-10 times with each hand. Thumb motion 1. Sit with your forearm resting on a table and your wrist straight. Your thumb should be facing up toward the ceiling. Keep your fingers relaxed as you move your thumb. 2. Lift your thumb up as high as you can toward the ceiling. Hold for __________ seconds. 3. Bend your thumb across your palm as far as you can, reaching the tip of your thumb for the small finger (pinkie) side of your palm. Hold for __________ seconds. Repeat this exercise 5-10 times with each hand. Grip strengthening 1. Hold a stress ball or other soft ball in the middle of your hand. 2. Slowly increase the pressure, squeezing the ball as much as you can without causing pain. Think of bringing the tips of your fingers into the middle of your palm. All of your finger joints should bend when doing this exercise. 3. Hold your squeeze for __________ seconds, then relax. Repeat this exercise 5-10 times with each hand.   Contact a health care provider if:  Your hand pain or discomfort gets much worse when you do an exercise.  Your hand pain or discomfort does not improve within 2 hours after you exercise. If you have any of these problems, stop doing these exercises right away. Do not do them again unless your health care provider says that you can. Get help right away if:  You develop sudden, severe hand pain or swelling. If this happens, stop doing these exercises right away. Do not do them again unless your health care provider says that you can. This information is not intended to replace advice given to you by your health care provider. Make sure you discuss any questions you have with your health care provider. Document Revised: 01/26/2019 Document Reviewed: 10/06/2018 Elsevier Patient Education  2021 [employer] Inc.  

## 2021-01-03 ENCOUNTER — Other Ambulatory Visit: Payer: Self-pay | Admitting: Cardiovascular Disease

## 2021-01-07 ENCOUNTER — Ambulatory Visit (HOSPITAL_COMMUNITY)
Admission: RE | Admit: 2021-01-07 | Discharge: 2021-01-07 | Disposition: A | Discharge status: Home / Self Care | Payer: Medicare Other | Source: Ambulatory Visit | Attending: Rheumatology | Admitting: Rheumatology

## 2021-01-07 ENCOUNTER — Other Ambulatory Visit: Payer: Self-pay | Admitting: Pharmacist

## 2021-01-07 ENCOUNTER — Other Ambulatory Visit: Payer: Self-pay

## 2021-01-07 DIAGNOSIS — Z79899 Other long term (current) drug therapy: Secondary | ICD-10-CM

## 2021-01-07 DIAGNOSIS — H209 Unspecified iridocyclitis: Secondary | ICD-10-CM | POA: Diagnosis not present

## 2021-01-07 DIAGNOSIS — D869 Sarcoidosis, unspecified: Secondary | ICD-10-CM

## 2021-01-07 MED ORDER — ACETAMINOPHEN 325 MG PO TABS: 650.0000 mg | ORAL_TABLET | ORAL | Status: DC

## 2021-01-07 MED ORDER — DIPHENHYDRAMINE HCL 25 MG PO CAPS: 25.0000 mg | ORAL_CAPSULE | ORAL | Status: DC

## 2021-01-07 MED ORDER — SODIUM CHLORIDE 0.9 % IV SOLN
3.0000 mg/kg | INTRAVENOUS | Status: DC
  Administered 2021-01-07: 200 mg via INTRAVENOUS
  Filled 2021-01-07: qty 20

## 2021-01-07 NOTE — Progress Notes (Signed)
Next infusion scheduled for Remicade is today, 01/07/21 and due for updated orders. Indication: uveitis and sarcoidosis  Patient also takes methotrexate 20mg  weekly  Dose: 3mg /kg every 6 weeks (rounded up to dose of 200mg  based on last recorded weight of 59 kg)  Last Clinic Visit: 12/19/20 Next Clinic Visit: 05/22/21  Last infusion: 11/26/20 Labs: 11/26/20 - glucose was slightly elevated, total protein was borderline low. Rest of CBC and CMP wnl TB Gold: negative 07/23/20   Orders placed for Remicade 3mg /kg x 2 doses along with premedication of Tylenol and Benadryl to be administered 30 minutes before medication infusion.  Standing CBC with diff/platelet and CMP with GFR orders placed to be drawn every 2 months.  Next TB gold due October 2022  Called patient and provided with phone number for [facility] [phone])  , PharmD, MPH Clinical Pharmacist (Rheumatology and Pulmonology)

## 2021-01-14 ENCOUNTER — Encounter: Payer: Self-pay | Admitting: Rheumatology

## 2021-01-14 DIAGNOSIS — H0102B Squamous blepharitis left eye, upper and lower eyelids: Secondary | ICD-10-CM | POA: Diagnosis not present

## 2021-01-14 DIAGNOSIS — H40013 Open angle with borderline findings, low risk, bilateral: Secondary | ICD-10-CM | POA: Diagnosis not present

## 2021-01-14 DIAGNOSIS — H0102A Squamous blepharitis right eye, upper and lower eyelids: Secondary | ICD-10-CM | POA: Diagnosis not present

## 2021-01-14 DIAGNOSIS — H0288A Meibomian gland dysfunction right eye, upper and lower eyelids: Secondary | ICD-10-CM | POA: Diagnosis not present

## 2021-01-14 DIAGNOSIS — H16223 Keratoconjunctivitis sicca, not specified as Sjogren's, bilateral: Secondary | ICD-10-CM | POA: Diagnosis not present

## 2021-01-14 DIAGNOSIS — H2013 Chronic iridocyclitis, bilateral: Secondary | ICD-10-CM | POA: Diagnosis not present

## 2021-01-14 DIAGNOSIS — H1045 Other chronic allergic conjunctivitis: Secondary | ICD-10-CM | POA: Diagnosis not present

## 2021-01-14 DIAGNOSIS — H0288B Meibomian gland dysfunction left eye, upper and lower eyelids: Secondary | ICD-10-CM | POA: Diagnosis not present

## 2021-01-15 ENCOUNTER — Other Ambulatory Visit: Payer: Self-pay | Admitting: Internal Medicine

## 2021-01-15 DIAGNOSIS — E7800 Pure hypercholesterolemia, unspecified: Secondary | ICD-10-CM

## 2021-01-22 ENCOUNTER — Telehealth: Payer: Self-pay

## 2021-01-22 NOTE — Telephone Encounter (Signed)
Patient called stating she would like to apply for gel injections for her bilateral knees.  Patient states her left knee is swollen and has a lot of fluid in it.

## 2021-01-22 NOTE — Telephone Encounter (Signed)
Ok to apply for hyalgan injections for both knees.

## 2021-01-22 NOTE — Telephone Encounter (Signed)
Please see message below  Thank you

## 2021-01-22 NOTE — Telephone Encounter (Signed)
OV 12/19/2020, She noticed improvement in her bilateral knee joint discomfort after undergoing Orthovisc injections in March 2021.  In the past she found Hyalgan to be more effective than Orthovisc.  She is not ready to reapply for Visco gel injections at this time.  Please advise.

## 2021-01-28 DIAGNOSIS — M9901 Segmental and somatic dysfunction of cervical region: Secondary | ICD-10-CM | POA: Diagnosis not present

## 2021-01-28 DIAGNOSIS — M9903 Segmental and somatic dysfunction of lumbar region: Secondary | ICD-10-CM | POA: Diagnosis not present

## 2021-01-28 DIAGNOSIS — M5459 Other low back pain: Secondary | ICD-10-CM | POA: Diagnosis not present

## 2021-01-28 DIAGNOSIS — M9906 Segmental and somatic dysfunction of lower extremity: Secondary | ICD-10-CM | POA: Diagnosis not present

## 2021-01-28 DIAGNOSIS — M9902 Segmental and somatic dysfunction of thoracic region: Secondary | ICD-10-CM | POA: Diagnosis not present

## 2021-01-28 NOTE — Telephone Encounter (Signed)
Please call to schedule Visco Knee Injections.  Authorized for Hyalgan series Bilateral Knees. Buy and Bill. Deductible has been met. No PA required. Medicare to cover 80% of the allowable amount, [employer] to cover 20% of allowable cost. No copay required.

## 2021-01-29 DIAGNOSIS — M9906 Segmental and somatic dysfunction of lower extremity: Secondary | ICD-10-CM | POA: Diagnosis not present

## 2021-01-29 DIAGNOSIS — M9903 Segmental and somatic dysfunction of lumbar region: Secondary | ICD-10-CM | POA: Diagnosis not present

## 2021-01-29 DIAGNOSIS — M9902 Segmental and somatic dysfunction of thoracic region: Secondary | ICD-10-CM | POA: Diagnosis not present

## 2021-01-29 DIAGNOSIS — M5459 Other low back pain: Secondary | ICD-10-CM | POA: Diagnosis not present

## 2021-01-29 DIAGNOSIS — M9901 Segmental and somatic dysfunction of cervical region: Secondary | ICD-10-CM | POA: Diagnosis not present

## 2021-01-30 ENCOUNTER — Other Ambulatory Visit: Payer: Self-pay

## 2021-01-30 ENCOUNTER — Other Ambulatory Visit (INDEPENDENT_AMBULATORY_CARE_PROVIDER_SITE_OTHER): Payer: Medicare Other

## 2021-01-30 DIAGNOSIS — E7800 Pure hypercholesterolemia, unspecified: Secondary | ICD-10-CM

## 2021-01-30 LAB — COMPREHENSIVE METABOLIC PANEL
ALT: 10 U/L (ref 0–35)
AST: 14 U/L (ref 0–37)
Albumin: 4.2 g/dL (ref 3.5–5.2)
Alkaline Phosphatase: 73 U/L (ref 39–117)
BUN: 22 mg/dL (ref 6–23)
CO2: 27 mEq/L (ref 19–32)
Calcium: 9.2 mg/dL (ref 8.4–10.5)
Chloride: 107 mEq/L (ref 96–112)
Creatinine, Ser: 0.92 mg/dL (ref 0.40–1.20)
GFR: 63.56 mL/min (ref >60.00)
Glucose, Bld: 106 mg/dL — ABNORMAL HIGH (ref 70–99)
Potassium: 3.8 mEq/L (ref 3.5–5.1)
Sodium: 141 mEq/L (ref 135–145)
Total Bilirubin: 0.6 mg/dL (ref 0.2–1.2)
Total Protein: 6.9 g/dL (ref 6.0–8.3)

## 2021-01-30 LAB — LIPID PANEL
Cholesterol: 212 mg/dL — ABNORMAL HIGH (ref 0–200)
HDL: 49.6 mg/dL (ref >39.00)
LDL Cholesterol: 132 mg/dL — ABNORMAL HIGH (ref 0–99)
NonHDL: 162.3
Total CHOL/HDL Ratio: 4
Triglycerides: 153 mg/dL — ABNORMAL HIGH (ref 0.0–149.0)
VLDL: 30.6 mg/dL (ref 0.0–40.0)

## 2021-02-02 DIAGNOSIS — E7800 Pure hypercholesterolemia, unspecified: Secondary | ICD-10-CM

## 2021-02-02 DIAGNOSIS — E785 Hyperlipidemia, unspecified: Secondary | ICD-10-CM

## 2021-02-03 DIAGNOSIS — M9902 Segmental and somatic dysfunction of thoracic region: Secondary | ICD-10-CM | POA: Diagnosis not present

## 2021-02-03 DIAGNOSIS — M9901 Segmental and somatic dysfunction of cervical region: Secondary | ICD-10-CM | POA: Diagnosis not present

## 2021-02-03 DIAGNOSIS — M9906 Segmental and somatic dysfunction of lower extremity: Secondary | ICD-10-CM | POA: Diagnosis not present

## 2021-02-03 DIAGNOSIS — M5459 Other low back pain: Secondary | ICD-10-CM | POA: Diagnosis not present

## 2021-02-03 DIAGNOSIS — M9903 Segmental and somatic dysfunction of lumbar region: Secondary | ICD-10-CM | POA: Diagnosis not present

## 2021-02-03 MED ORDER — PRAVASTATIN SODIUM 40 MG PO TABS: 40.0000 mg | ORAL_TABLET | Freq: Every day | ORAL | 3 refills | Status: DC

## 2021-02-04 DIAGNOSIS — M9901 Segmental and somatic dysfunction of cervical region: Secondary | ICD-10-CM | POA: Diagnosis not present

## 2021-02-04 DIAGNOSIS — M5459 Other low back pain: Secondary | ICD-10-CM | POA: Diagnosis not present

## 2021-02-04 DIAGNOSIS — M9903 Segmental and somatic dysfunction of lumbar region: Secondary | ICD-10-CM | POA: Diagnosis not present

## 2021-02-04 DIAGNOSIS — M9906 Segmental and somatic dysfunction of lower extremity: Secondary | ICD-10-CM | POA: Diagnosis not present

## 2021-02-04 DIAGNOSIS — M9902 Segmental and somatic dysfunction of thoracic region: Secondary | ICD-10-CM | POA: Diagnosis not present

## 2021-02-05 DIAGNOSIS — M9901 Segmental and somatic dysfunction of cervical region: Secondary | ICD-10-CM | POA: Diagnosis not present

## 2021-02-05 DIAGNOSIS — M9902 Segmental and somatic dysfunction of thoracic region: Secondary | ICD-10-CM | POA: Diagnosis not present

## 2021-02-05 DIAGNOSIS — M9906 Segmental and somatic dysfunction of lower extremity: Secondary | ICD-10-CM | POA: Diagnosis not present

## 2021-02-05 DIAGNOSIS — M9903 Segmental and somatic dysfunction of lumbar region: Secondary | ICD-10-CM | POA: Diagnosis not present

## 2021-02-10 DIAGNOSIS — M9906 Segmental and somatic dysfunction of lower extremity: Secondary | ICD-10-CM | POA: Diagnosis not present

## 2021-02-10 DIAGNOSIS — M9903 Segmental and somatic dysfunction of lumbar region: Secondary | ICD-10-CM | POA: Diagnosis not present

## 2021-02-10 DIAGNOSIS — M9902 Segmental and somatic dysfunction of thoracic region: Secondary | ICD-10-CM | POA: Diagnosis not present

## 2021-02-10 DIAGNOSIS — M9901 Segmental and somatic dysfunction of cervical region: Secondary | ICD-10-CM | POA: Diagnosis not present

## 2021-02-11 ENCOUNTER — Other Ambulatory Visit: Payer: Self-pay

## 2021-02-11 ENCOUNTER — Ambulatory Visit (INDEPENDENT_AMBULATORY_CARE_PROVIDER_SITE_OTHER): Payer: Medicare Other | Admitting: Physician Assistant

## 2021-02-11 DIAGNOSIS — M9906 Segmental and somatic dysfunction of lower extremity: Secondary | ICD-10-CM | POA: Diagnosis not present

## 2021-02-11 DIAGNOSIS — M9902 Segmental and somatic dysfunction of thoracic region: Secondary | ICD-10-CM | POA: Diagnosis not present

## 2021-02-11 DIAGNOSIS — M170 Bilateral primary osteoarthritis of knee: Secondary | ICD-10-CM | POA: Diagnosis not present

## 2021-02-11 DIAGNOSIS — M9901 Segmental and somatic dysfunction of cervical region: Secondary | ICD-10-CM | POA: Diagnosis not present

## 2021-02-11 DIAGNOSIS — M9903 Segmental and somatic dysfunction of lumbar region: Secondary | ICD-10-CM | POA: Diagnosis not present

## 2021-02-11 MED ORDER — LIDOCAINE HCL 1 % IJ SOLN
1.5000 mL | INTRAMUSCULAR | Status: AC | PRN
  Administered 2021-02-11: 1.5 mL

## 2021-02-11 MED ORDER — SODIUM HYALURONATE (VISCOSUP) 20 MG/2ML IX SOSY
20.0000 mg | PREFILLED_SYRINGE | INTRA_ARTICULAR | Status: AC | PRN
  Administered 2021-02-11: 20 mg via INTRA_ARTICULAR

## 2021-02-11 MED ORDER — LIDOCAINE HCL 1 % IJ SOLN
1.5000 mL | INTRAMUSCULAR | Status: AC | PRN
Start: 2021-02-11 — End: 2021-02-11
  Administered 2021-02-11: 1.5 mL

## 2021-02-11 MED ORDER — SODIUM HYALURONATE (VISCOSUP) 20 MG/2ML IX SOSY
20.0000 mg | PREFILLED_SYRINGE | INTRA_ARTICULAR | Status: AC | PRN
Start: 2021-02-11 — End: 2021-02-11
  Administered 2021-02-11: 20 mg via INTRA_ARTICULAR

## 2021-02-11 NOTE — Progress Notes (Signed)
   Procedure Note  Patient: Saranne Crislip             Date of Birth: [birth year]           MRN: [id number]             Visit Date: 02/11/2021  Procedures: Visit Diagnoses:  1. Primary osteoarthritis of both knees    Hyalgan #1 Bilateral knee joint injections  Large Joint Inj: bilateral knee on 02/11/2021 9:05 AM Indications: pain Details: 27 G 1.5 in needle, medial approach  Arthrogram: No  Medications (Right): 1.5 mL lidocaine 1 %; 20 mg Sodium Hyaluronate 20 MG/2ML Aspirate (Right): 0 mL Medications (Left): 1.5 mL lidocaine 1 %; 20 mg Sodium Hyaluronate 20 MG/2ML Aspirate (Left): 0 mL Outcome: tolerated well, no immediate complications Procedure, treatment alternatives, risks and benefits explained, specific risks discussed. Consent was given by the patient. Immediately prior to procedure a time out was called to verify the correct patient, procedure, equipment, support staff and site/side marked as required. Patient was prepped and draped in the usual sterile fashion.     Patient tolerated the procedure well.  Aftercare was discussed.  Sherron Ales, PA-C

## 2021-02-12 DIAGNOSIS — M9902 Segmental and somatic dysfunction of thoracic region: Secondary | ICD-10-CM | POA: Diagnosis not present

## 2021-02-12 DIAGNOSIS — M9906 Segmental and somatic dysfunction of lower extremity: Secondary | ICD-10-CM | POA: Diagnosis not present

## 2021-02-12 DIAGNOSIS — M9903 Segmental and somatic dysfunction of lumbar region: Secondary | ICD-10-CM | POA: Diagnosis not present

## 2021-02-12 DIAGNOSIS — M9901 Segmental and somatic dysfunction of cervical region: Secondary | ICD-10-CM | POA: Diagnosis not present

## 2021-02-16 ENCOUNTER — Other Ambulatory Visit: Payer: Self-pay | Admitting: Primary Care

## 2021-02-18 ENCOUNTER — Ambulatory Visit (HOSPITAL_COMMUNITY)
Admission: RE | Admit: 2021-02-18 | Discharge: 2021-02-18 | Disposition: A | Discharge status: Home / Self Care | Payer: Medicare Other | Source: Ambulatory Visit | Attending: Rheumatology | Admitting: Rheumatology

## 2021-02-18 ENCOUNTER — Ambulatory Visit (INDEPENDENT_AMBULATORY_CARE_PROVIDER_SITE_OTHER): Payer: Medicare Other | Admitting: Physician Assistant

## 2021-02-18 ENCOUNTER — Other Ambulatory Visit: Payer: Self-pay

## 2021-02-18 DIAGNOSIS — M170 Bilateral primary osteoarthritis of knee: Secondary | ICD-10-CM

## 2021-02-18 DIAGNOSIS — D869 Sarcoidosis, unspecified: Secondary | ICD-10-CM

## 2021-02-18 DIAGNOSIS — H209 Unspecified iridocyclitis: Secondary | ICD-10-CM | POA: Diagnosis not present

## 2021-02-18 DIAGNOSIS — Z79899 Other long term (current) drug therapy: Secondary | ICD-10-CM

## 2021-02-18 LAB — COMPREHENSIVE METABOLIC PANEL
ALT: 18 U/L (ref 0–44)
AST: 24 U/L (ref 15–41)
Albumin: 4 g/dL (ref 3.5–5.0)
Alkaline Phosphatase: 67 U/L (ref 38–126)
Anion gap: 6 (ref 5–15)
BUN: 17 mg/dL (ref 8–23)
CO2: 27 mmol/L (ref 22–32)
Calcium: 9 mg/dL (ref 8.9–10.3)
Chloride: 108 mmol/L (ref 98–111)
Creatinine, Ser: 0.9 mg/dL (ref 0.44–1.00)
GFR, Estimated: >60 mL/min (ref >60)
Glucose, Bld: 109 mg/dL — ABNORMAL HIGH (ref 70–99)
Potassium: 4.2 mmol/L (ref 3.5–5.1)
Sodium: 141 mmol/L (ref 135–145)
Total Bilirubin: 0.8 mg/dL (ref 0.3–1.2)
Total Protein: 6.7 g/dL (ref 6.5–8.1)

## 2021-02-18 LAB — CBC WITH DIFFERENTIAL/PLATELET
Abs Immature Granulocytes: 0.01 10*3/uL (ref 0.00–0.07)
Basophils Absolute: 0 10*3/uL (ref 0.0–0.1)
Basophils Relative: 0 %
Eosinophils Absolute: 0.1 10*3/uL (ref 0.0–0.5)
Eosinophils Relative: 2 %
HCT: 41.4 % (ref 36.0–46.0)
Hemoglobin: 13.7 g/dL (ref 12.0–15.0)
Immature Granulocytes: 0 %
Lymphocytes Relative: 39 %
Lymphs Abs: 2.7 10*3/uL (ref 0.7–4.0)
MCH: 30.6 pg (ref 26.0–34.0)
MCHC: 33.1 g/dL (ref 30.0–36.0)
MCV: 92.4 fL (ref 80.0–100.0)
Monocytes Absolute: 0.4 10*3/uL (ref 0.1–1.0)
Monocytes Relative: 6 %
Neutro Abs: 3.6 10*3/uL (ref 1.7–7.7)
Neutrophils Relative %: 53 %
Platelets: 287 10*3/uL (ref 150–400)
RBC: 4.48 MIL/uL (ref 3.87–5.11)
RDW: 13.2 % (ref 11.5–15.5)
WBC: 6.9 10*3/uL (ref 4.0–10.5)
nRBC: 0 % (ref 0.0–0.2)

## 2021-02-18 MED ORDER — DIPHENHYDRAMINE HCL 25 MG PO CAPS: 25.0000 mg | ORAL_CAPSULE | ORAL | Status: DC

## 2021-02-18 MED ORDER — SODIUM HYALURONATE (VISCOSUP) 20 MG/2ML IX SOSY
20.0000 mg | PREFILLED_SYRINGE | INTRA_ARTICULAR | Status: AC | PRN
  Administered 2021-02-18: 20 mg via INTRA_ARTICULAR

## 2021-02-18 MED ORDER — SODIUM CHLORIDE 0.9 % IV SOLN
3.0000 mg/kg | INTRAVENOUS | Status: AC
  Administered 2021-02-18: 200 mg via INTRAVENOUS
  Filled 2021-02-18: qty 20

## 2021-02-18 MED ORDER — ACETAMINOPHEN 325 MG PO TABS: 650.0000 mg | ORAL_TABLET | ORAL | Status: DC

## 2021-02-18 MED ORDER — LIDOCAINE HCL 1 % IJ SOLN
1.5000 mL | INTRAMUSCULAR | Status: AC | PRN
  Administered 2021-02-18: 1.5 mL

## 2021-02-18 NOTE — Progress Notes (Signed)
   Procedure Note  Patient: Renee Jones             Date of Birth: December 10, 1950           MRN: [id number]             Visit Date: 02/18/2021  Procedures: Visit Diagnoses:  1. Primary osteoarthritis of both knees    Hyalgan #2 Bilateral knee joint injections  Large Joint Inj: bilateral knee on 02/18/2021 10:08 AM Indications: pain Details: 27 G 1.5 in needle, medial approach  Arthrogram: No  Medications (Right): 1.5 mL lidocaine 1 %; 20 mg Sodium Hyaluronate 20 MG/2ML Aspirate (Right): 0 mL Medications (Left): 1.5 mL lidocaine 1 %; 20 mg Sodium Hyaluronate 20 MG/2ML Aspirate (Left): 0 mL Outcome: tolerated well, no immediate complications Procedure, treatment alternatives, risks and benefits explained, specific risks discussed. Consent was given by the patient. Immediately prior to procedure a time out was called to verify the correct patient, procedure, equipment, support staff and site/side marked as required. Patient was prepped and draped in the usual sterile fashion.    Patient tolerated the procedure well.  Aftercare was discussed.  Sherron Ales, PA-C

## 2021-02-20 ENCOUNTER — Ambulatory Visit (INDEPENDENT_AMBULATORY_CARE_PROVIDER_SITE_OTHER): Payer: Medicare Other | Admitting: Cardiovascular Disease

## 2021-02-20 ENCOUNTER — Other Ambulatory Visit: Payer: Self-pay

## 2021-02-20 ENCOUNTER — Encounter: Payer: Self-pay | Admitting: Cardiovascular Disease

## 2021-02-20 VITALS — BP 141/68 | HR 60 | Ht 63.0 in | Wt 128.0 lb

## 2021-02-20 DIAGNOSIS — I10 Essential (primary) hypertension: Secondary | ICD-10-CM

## 2021-02-20 DIAGNOSIS — E7800 Pure hypercholesterolemia, unspecified: Secondary | ICD-10-CM

## 2021-02-20 DIAGNOSIS — I471 Supraventricular tachycardia: Secondary | ICD-10-CM

## 2021-02-20 DIAGNOSIS — E785 Hyperlipidemia, unspecified: Secondary | ICD-10-CM | POA: Insufficient documentation

## 2021-02-20 HISTORY — DX: Pure hypercholesterolemia, unspecified: E78.00

## 2021-02-20 NOTE — Patient Instructions (Signed)
Medication Instructions:  Your physician recommends that you continue on your current medications as directed. Please refer to the Current Medication list given to you today.   *If you need a refill on your cardiac medications before your next appointment, please call your pharmacy*  Lab Work: NONE  Testing/Procedures: NONE   Follow-Up: At [facility], you and your health needs are our priority.  As part of our continuing mission to provide you with exceptional heart care, we have created designated Provider Care Teams.  These Care Teams include your primary Cardiologist (physician) and Advanced Practice Providers (APPs -  Physician Assistants and Nurse Practitioners) who all work together to provide you with the care you need, when you need it.  We recommend signing up for the patient portal called "MyChart".  Sign up information is provided on this After Visit Summary.  MyChart is used to connect with patients for Virtual Visits (Telemedicine).  Patients are able to view lab/test results, encounter notes, upcoming appointments, etc.  Non-urgent messages can be sent to your provider as well.   To learn more about what you can do with MyChart, go to [email or web address].    Your next appointment:   12 month(s)  The format for your next appointment:   In Person  Provider:   DR [facility] OR PA AT [facility] LOCATION

## 2021-02-20 NOTE — Progress Notes (Signed)
Cardiology Office Note   Date:  02/20/2021   ID:  Renee Jones, DOB 1951-06-23, MRN [id number]  PCP:  Doreene Nest, NP  Cardiologist:   Chilton Si, MD  EP: Loman Brooklyn, MD   No chief complaint on file.   Patient ID:   Renee Jones is a 70 y.o. female paroxysmal SVT, HTN, GERD, and sarcoidosis.    History of Present Illness: Renee Jones reports episodes of SVT that have been occurring for as long as she can remember.  She previously took a beta blocker which helped her symptoms but stopped taking it 20 years ago because she didn't think she needed it anymore.  Renee Jones was previously a patient of Dr. Michaelle Copas and had a Holter monitor documenting SVT.  Renee Jones was evaluated in the ED 02/2015 with elevated BP to 197/120.   She had a cardiac MRI 10/2012 that did not reveal any delayed enhancement concerning for cardiac involvement of her sarcoid.  She underwent CPX testing to evaluate shortness of breath and it was suggestive of diastolic dysfunction.  She was started on metoprolol for SVT and it also helped her shortness of breath.  She was referred for an echo 08/2015 that revealed LVEF 55-60% with grade 1 diastolic dysfunction. She saw Dr. Elberta Fortis and has not been interested in ablation. Metoprolol was increased with an improvement in her symptoms.  At her last appointment Renee Jones reported increased episodes of palpitations.  However she misunderstood that her PCP recommended she stop hydrochlorothiazide and actually stop the metoprolol instead.  It was recommended that she resume metoprolol.  Since then she is feeling much better.  She has very rare episodes of palpitations.  It happened once or twice in the last month and lasted less than a minute.  She is walking for exercise for 30 minutes daily.  She has no exertional symptoms or edema.  She checks her blood pressure and it is been consistently in the 110s to 120s and under 80 diastolic.  She had her  knees injected and is feeling well.  She is also doing intermittent fasting to lose the weight she gained during COVID.  She is very hesitant about getting the Covid vaccine.  Renee Jones's husband died in 03-17-2018.  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  She has been working on a new book.  She uses the treamill 30-45 min every week day.  She feels good with exericse other than some knee pain.  She has been getting knee injections which helps.  She also lifts weights.  She has been trying to get her cholesterol and glucose down.  She wanted to control them naturally but it wasn't helping her numbers.  She started one statin but developed tinnitus.  She is tolerating pravastatin.  It was started recently due to elevated LDL.  She no longer has a gurgling in her chest.  Her BP at home has been mostly 120s and rarely over 130.  On average it is 128/60s.     Past Medical History:  Diagnosis Date  . Allergy   . Asthma   . Bronchial spasm 09/14/2016  . DDD (degenerative disc disease), lumbar 09/14/2016  . Detached retina 09/14/2016  . Diastolic dysfunction 08/28/2015  . Essential hypertension 08/28/2015  . GERD (gastroesophageal reflux disease)   . Glaucoma 09/14/2016  . Neuromuscular disorder (HCC)   .  Osteoarthritis of both hands 09/14/2016  . Osteoarthritis of both knees 09/14/2016  . Pure hypercholesterolemia 02/20/2021  . Sarcoidosis    with +lymph node biopsy  . SVT (supraventricular tachycardia) (HCC) 08/28/2015  . Tachycardia   . Uveitis     Past Surgical History:  Procedure Laterality Date  . CATARACT EXTRACTION, BILATERAL  2018  . CYSTECTOMY    . EYE SURGERY Bilateral 2018   cataract removal   . LUMBAR SPINE SURGERY  2009  . ROTATOR CUFF REPAIR Right 2011  . ROTATOR CUFF REPAIR Left 06/2018  . TONSILLECTOMY AND ADENOIDECTOMY  1962  . TOTAL ABDOMINAL HYSTERECTOMY  1984     Current Outpatient  Medications  Medication Sig Dispense Refill  . albuterol (VENTOLIN HFA) 108 (90 Base) MCG/ACT inhaler     . amLODipine (NORVASC) 5 MG tablet TAKE 1 TABLET(5 MG) BY MOUTH DAILY FOR BLOOD PRESSURE 90 tablet 0  . cholecalciferol (VITAMIN D) 1000 units tablet Take 1,000 Units by mouth daily.    . cyclobenzaprine (FLEXERIL) 10 MG tablet TK 1 T PO Q 8 H PRF SPASM    . diclofenac sodium (VOLTAREN) 1 % GEL Apply three grams to three large joints up to three times daily. (Patient taking differently: Apply three grams to three large joints up to three times daily as needed) 3 Tube 3  . fluticasone (FLONASE) 50 MCG/ACT nasal spray Place 2 sprays into both nostrils daily as needed for allergies or rhinitis.    . folic acid (FOLVITE) 1 MG tablet Take 2 tablets by mouth once daily 180 tablet 3  . InFLIXimab (REMICADE IV) Inject into the vein every 6 (six) weeks.    . methotrexate 2.5 MG tablet TAKE 10 TABLETS BY MOUTH ONCE A WEEK (Patient taking differently: 8 tablets weekly) 120 tablet 0  . metoprolol tartrate (LOPRESSOR) 50 MG tablet TAKE 1 AND 1/2 TABLETS BY MOUTH TWICE DAILY 270 tablet 0  . naproxen sodium (ALEVE) 220 MG tablet     . ondansetron (ZOFRAN) 4 MG tablet TAKE 1 TABLET BY MOUTH EVERY 6 HOURS AS NEEDED FOR NAUSEA AND VOMITING 30 tablet 0  . pantoprazole (PROTONIX) 40 MG tablet TAKE 1 TABLET BY MOUTH ONCE DAILY 90 tablet 1  . pravastatin (PRAVACHOL) 40 MG tablet Take 1 tablet (40 mg total) by mouth daily. For cholesterol. 90 tablet 3  . SYSTANE ULTRA 0.4-0.3 % SOLN SMARTSIG:1 Drop(s) In Eye(s) 6 Times Daily    . White Petrolatum-Mineral Oil (SYSTANE NIGHTTIME) OINT SMARTSIG:1 Inch(es) In Eye(s) Every Night     No current facility-administered medications for this visit.    Allergies:   Codeine, Amoxicillin-pot clavulanate, Erythromycin base, Other, Sulfa antibiotics, Tramadol, Tramadol hcl, and Erythromycin    Social History:  The patient  reports that she has never smoked. She has never used  smokeless tobacco. She reports that she does not drink alcohol and does not use drugs.   Family History:  The patient's family history includes Scleroderma in her daughter. She was adopted.    ROS:  Please see the history of present illness.   Otherwise, review of systems are positive for none.   All other systems are reviewed and negative.    PHYSICAL EXAM: VS:  BP (!) 141/68   Pulse 60   Ht 5\' 3"  (1.6 m)   Wt 128 lb (58.1 kg)   SpO2 98%   BMI 22.67 kg/m  , BMI Body mass index is 22.67 kg/m. GENERAL:  Well appearing HEENT: Pupils equal round and  reactive, fundi not visualized, oral mucosa unremarkable NECK:  No jugular venous distention, waveform within normal limits, carotid upstroke brisk and symmetric, no bruits LUNGS:  Clear to auscultation bilaterally HEART:  RRR.  PMI not displaced or sustained,S1 and S2 within normal limits, no S3, no S4, no clicks, no rubs, no murmurs ABD:  Flat, positive bowel sounds normal in frequency in pitch, no bruits, no rebound, no guarding, no midline pulsatile mass, no hepatomegaly, no splenomegaly EXT:  2 plus pulses throughout, no edema, no cyanosis no clubbing SKIN:  No rashes no nodules NEURO:  Cranial nerves II through XII grossly intact, motor grossly intact throughout PSYCH:  Cognitively intact, oriented to person place and time   EKG:  EKG is ordered today. 05/04/16:Sinus rhythm.  Rate 74 bpm.  Inferior TWI. 11/18/16: Sinus rhythm. Rate 72 bpm. We will monitor 11/30/19: Sinus rhythm.  85 bpm.  Nonspecific T wave abnormalities 02/20/2021: Sinus rhythm.  Rate 60 bpm.    Recent Labs: 02/18/2021: ALT 18; BUN 17; Creatinine, Ser 0.90; Hemoglobin 13.7; Platelets 287; Potassium 4.2; Sodium 141    Lipid Panel    Component Value Date/Time   CHOL 212 (H) 01/30/2021 0738   TRIG 153.0 (H) 01/30/2021 0738   HDL 49.60 01/30/2021 0738   CHOLHDL 4 01/30/2021 0738   VLDL 30.6 01/30/2021 0738   LDLCALC 132 (H) 01/30/2021 0738   LDLDIRECT 134.0  10/23/2020 1004      Wt Readings from Last 3 Encounters:  02/20/21 128 lb (58.1 kg)  02/18/21 130 lb (59 kg)  01/07/21 132 lb (59.9 kg)      Other studies Reviewed: Additional studies/ records that were reviewed today include: PCP note  Review of the above records demonstrates:  Please see elsewhere in the note.     CPX 05/01/15: Conclusion: Exercise testing with gas-exchange demonstrates a mild functional impairment when compared to matched sedentary norms. There is no significant evidence of EIB. The combination of reduced PVO2, hypertensive BP, elevated VE/VCO2 slope, and flat O2 pulse suggests possible circulatory limitations (specifically diastolic dysfunction).   2014 CMRI 1. Normal LV size and systolic function, EF 69%.  2. Normal RV size and systolic function.  3. No myocardial delayed enhancement, so no definitive evidence for prior MI, infiltrative disease, or myocarditis.  4. No evidence on this exam for cardiac sarcoidosis.   ASSESSMENT AND PLAN:  # SVT: No recent episodes.  Stable on metoprolol.  # Diastolic dysfunction: CPX was suggestive of diastolic dysfunction and her symptoms have improved with metoprolol.  However, her echo only showed grade 1 diastolic function and she does not clinically appear to have heart failure.  She remains euvolemic and exercises regularly without symptoms.  Continue metoprolol.  # Hypertension: BP has been well have been controlled at home but is elevated here today.  She will continue to monitor it.  She was congratulated on her excellent diet and exercise.  Continue amlodipine and metoprolol.  # Sarcoidosis: No evidence of cardiac involvement on MRI 10/2012.  # Chest pain: Resolved with treatment of GERD  # Hyperlipidemia: She is tolerating pravastatin.  This was started recently due to persistently elevated lipids despite diet and exercise.  Appreciate Renee Jones starting her on this.   Current medicines are reviewed  at length with the patient today.  The patient does not have concerns regarding medicines.  The following changes have been made: None  Labs/ tests ordered today include: none.  Orders Placed This Encounter  Procedures  . EKG  12-Lead     Disposition:   FU with Renee Jones in 1 year   Signed, Chilton Si, MD  02/20/2021 11:44 AM    [facility]

## 2021-02-24 DIAGNOSIS — M9901 Segmental and somatic dysfunction of cervical region: Secondary | ICD-10-CM | POA: Diagnosis not present

## 2021-02-24 DIAGNOSIS — M9902 Segmental and somatic dysfunction of thoracic region: Secondary | ICD-10-CM | POA: Diagnosis not present

## 2021-02-24 DIAGNOSIS — M9903 Segmental and somatic dysfunction of lumbar region: Secondary | ICD-10-CM | POA: Diagnosis not present

## 2021-02-24 DIAGNOSIS — M9906 Segmental and somatic dysfunction of lower extremity: Secondary | ICD-10-CM | POA: Diagnosis not present

## 2021-02-25 ENCOUNTER — Ambulatory Visit (INDEPENDENT_AMBULATORY_CARE_PROVIDER_SITE_OTHER): Payer: Medicare Other | Admitting: Physician Assistant

## 2021-02-25 ENCOUNTER — Other Ambulatory Visit: Payer: Self-pay

## 2021-02-25 DIAGNOSIS — M170 Bilateral primary osteoarthritis of knee: Secondary | ICD-10-CM

## 2021-02-25 MED ORDER — LIDOCAINE HCL 1 % IJ SOLN
1.5000 mL | INTRAMUSCULAR | Status: AC | PRN
  Administered 2021-02-25: 1.5 mL

## 2021-02-25 MED ORDER — SODIUM HYALURONATE (VISCOSUP) 20 MG/2ML IX SOSY
20.0000 mg | PREFILLED_SYRINGE | INTRA_ARTICULAR | Status: AC | PRN
  Administered 2021-02-25: 20 mg via INTRA_ARTICULAR

## 2021-02-25 NOTE — Progress Notes (Signed)
   Procedure Note  Patient: Renee Jones             Date of Birth: 1951/03/16           MRN: [id number]             Visit Date: 02/25/2021  Procedures: Visit Diagnoses:  1. Primary osteoarthritis of both knees      Hyalgan #3 bilateral knees, B/B  Large Joint Inj: bilateral knee on 02/25/2021 9:55 AM Indications: pain Details: 27 G 1.5 in needle, medial approach  Arthrogram: No  Medications (Right): 1.5 mL lidocaine 1 %; 20 mg Sodium Hyaluronate 20 MG/2ML Aspirate (Right): 0 mL Medications (Left): 1.5 mL lidocaine 1 %; 20 mg Sodium Hyaluronate 20 MG/2ML Aspirate (Left): 0 mL Outcome: tolerated well, no immediate complications Procedure, treatment alternatives, risks and benefits explained, specific risks discussed. Consent was given by the patient. Immediately prior to procedure a time out was called to verify the correct patient, procedure, equipment, support staff and site/side marked as required. Patient was prepped and draped in the usual sterile fashion.     Patient tolerated the procedure well. Aftercare was discussed.  Sherron Ales, PA-C

## 2021-02-26 DIAGNOSIS — M9906 Segmental and somatic dysfunction of lower extremity: Secondary | ICD-10-CM | POA: Diagnosis not present

## 2021-02-26 DIAGNOSIS — M9902 Segmental and somatic dysfunction of thoracic region: Secondary | ICD-10-CM | POA: Diagnosis not present

## 2021-02-26 DIAGNOSIS — M9903 Segmental and somatic dysfunction of lumbar region: Secondary | ICD-10-CM | POA: Diagnosis not present

## 2021-02-26 DIAGNOSIS — M9901 Segmental and somatic dysfunction of cervical region: Secondary | ICD-10-CM | POA: Diagnosis not present

## 2021-03-03 DIAGNOSIS — M9903 Segmental and somatic dysfunction of lumbar region: Secondary | ICD-10-CM | POA: Diagnosis not present

## 2021-03-03 DIAGNOSIS — M9906 Segmental and somatic dysfunction of lower extremity: Secondary | ICD-10-CM | POA: Diagnosis not present

## 2021-03-03 DIAGNOSIS — M9901 Segmental and somatic dysfunction of cervical region: Secondary | ICD-10-CM | POA: Diagnosis not present

## 2021-03-03 DIAGNOSIS — M9902 Segmental and somatic dysfunction of thoracic region: Secondary | ICD-10-CM | POA: Diagnosis not present

## 2021-03-05 DIAGNOSIS — M9902 Segmental and somatic dysfunction of thoracic region: Secondary | ICD-10-CM | POA: Diagnosis not present

## 2021-03-05 DIAGNOSIS — M9901 Segmental and somatic dysfunction of cervical region: Secondary | ICD-10-CM | POA: Diagnosis not present

## 2021-03-05 DIAGNOSIS — M9906 Segmental and somatic dysfunction of lower extremity: Secondary | ICD-10-CM | POA: Diagnosis not present

## 2021-03-05 DIAGNOSIS — M9903 Segmental and somatic dysfunction of lumbar region: Secondary | ICD-10-CM | POA: Diagnosis not present

## 2021-03-10 DIAGNOSIS — M9903 Segmental and somatic dysfunction of lumbar region: Secondary | ICD-10-CM | POA: Diagnosis not present

## 2021-03-10 DIAGNOSIS — M9901 Segmental and somatic dysfunction of cervical region: Secondary | ICD-10-CM | POA: Diagnosis not present

## 2021-03-10 DIAGNOSIS — M9902 Segmental and somatic dysfunction of thoracic region: Secondary | ICD-10-CM | POA: Diagnosis not present

## 2021-03-10 DIAGNOSIS — M9906 Segmental and somatic dysfunction of lower extremity: Secondary | ICD-10-CM | POA: Diagnosis not present

## 2021-03-11 ENCOUNTER — Other Ambulatory Visit: Payer: Self-pay

## 2021-03-11 ENCOUNTER — Ambulatory Visit (INDEPENDENT_AMBULATORY_CARE_PROVIDER_SITE_OTHER): Payer: Medicare Other | Admitting: Physician Assistant

## 2021-03-11 DIAGNOSIS — M170 Bilateral primary osteoarthritis of knee: Secondary | ICD-10-CM

## 2021-03-11 MED ORDER — LIDOCAINE HCL 1 % IJ SOLN
1.5000 mL | INTRAMUSCULAR | Status: AC | PRN
  Administered 2021-03-11: 1.5 mL

## 2021-03-11 MED ORDER — SODIUM HYALURONATE (VISCOSUP) 20 MG/2ML IX SOSY
20.0000 mg | PREFILLED_SYRINGE | INTRA_ARTICULAR | Status: AC | PRN
  Administered 2021-03-11: 20 mg via INTRA_ARTICULAR

## 2021-03-11 MED ORDER — SODIUM HYALURONATE (VISCOSUP) 20 MG/2ML IX SOSY
20.0000 mg | PREFILLED_SYRINGE | INTRA_ARTICULAR | Status: AC | PRN
Start: 2021-03-11 — End: 2021-03-11
  Administered 2021-03-11: 20 mg via INTRA_ARTICULAR

## 2021-03-11 NOTE — Progress Notes (Signed)
   Procedure Note  Patient: Renee Jones             Date of Birth: Mar 13, 1951           MRN: [id number]             Visit Date: 03/11/2021  Procedures: Visit Diagnoses:  1. Primary osteoarthritis of both knees    Hyalgan #4 bilateral knee joint injections  Large Joint Inj: bilateral knee on 03/11/2021 7:57 AM Indications: pain Details: 27 G 1.5 in needle, medial approach  Arthrogram: No  Medications (Right): 1.5 mL lidocaine 1 %; 20 mg Sodium Hyaluronate 20 MG/2ML Aspirate (Right): 0 mL Medications (Left): 1.5 mL lidocaine 1 %; 20 mg Sodium Hyaluronate 20 MG/2ML Aspirate (Left): 0 mL Outcome: tolerated well, no immediate complications Procedure, treatment alternatives, risks and benefits explained, specific risks discussed. Consent was given by the patient. Immediately prior to procedure a time out was called to verify the correct patient, procedure, equipment, support staff and site/side marked as required. Patient was prepped and draped in the usual sterile fashion.      Patient tolerated the procedure well.  Aftercare was discussed.  Sherron Ales, PA-C

## 2021-03-12 DIAGNOSIS — M9906 Segmental and somatic dysfunction of lower extremity: Secondary | ICD-10-CM | POA: Diagnosis not present

## 2021-03-12 DIAGNOSIS — M9901 Segmental and somatic dysfunction of cervical region: Secondary | ICD-10-CM | POA: Diagnosis not present

## 2021-03-12 DIAGNOSIS — M9903 Segmental and somatic dysfunction of lumbar region: Secondary | ICD-10-CM | POA: Diagnosis not present

## 2021-03-12 DIAGNOSIS — M9902 Segmental and somatic dysfunction of thoracic region: Secondary | ICD-10-CM | POA: Diagnosis not present

## 2021-03-13 DIAGNOSIS — J069 Acute upper respiratory infection, unspecified: Secondary | ICD-10-CM | POA: Diagnosis not present

## 2021-03-18 ENCOUNTER — Ambulatory Visit (INDEPENDENT_AMBULATORY_CARE_PROVIDER_SITE_OTHER): Payer: Medicare Other | Admitting: Physician Assistant

## 2021-03-18 ENCOUNTER — Other Ambulatory Visit: Payer: Self-pay

## 2021-03-18 DIAGNOSIS — M170 Bilateral primary osteoarthritis of knee: Secondary | ICD-10-CM | POA: Diagnosis not present

## 2021-03-18 MED ORDER — SODIUM HYALURONATE (VISCOSUP) 20 MG/2ML IX SOSY
20.0000 mg | PREFILLED_SYRINGE | INTRA_ARTICULAR | Status: AC | PRN
  Administered 2021-03-18: 20 mg via INTRA_ARTICULAR

## 2021-03-18 MED ORDER — LIDOCAINE HCL 1 % IJ SOLN
1.5000 mL | INTRAMUSCULAR | Status: AC | PRN
  Administered 2021-03-18: 1.5 mL

## 2021-03-18 NOTE — Progress Notes (Signed)
   Procedure Note  Patient: Schylar Allard             Date of Birth: 04/11/1951           MRN: [id number]             Visit Date: 03/18/2021  Procedures: Visit Diagnoses:  1. Primary osteoarthritis of both knees    Hyalgan #5 Bilateral knee joint injections  Large Joint Inj: bilateral knee on 03/18/2021 8:25 AM Indications: pain Details: 27 G 1.5 in needle, medial approach  Arthrogram: No  Medications (Right): 1.5 mL lidocaine 1 %; 20 mg Sodium Hyaluronate 20 MG/2ML Aspirate (Right): 0 mL Medications (Left): 1.5 mL lidocaine 1 %; 20 mg Sodium Hyaluronate 20 MG/2ML Aspirate (Left): 0 mL Outcome: tolerated well, no immediate complications Procedure, treatment alternatives, risks and benefits explained, specific risks discussed. Consent was given by the patient. Immediately prior to procedure a time out was called to verify the correct patient, procedure, equipment, support staff and site/side marked as required. Patient was prepped and draped in the usual sterile fashion.     Patient tolerated the procedure well.  Aftercare was discussed.  Sherron Ales, PA-C

## 2021-03-19 DIAGNOSIS — M9902 Segmental and somatic dysfunction of thoracic region: Secondary | ICD-10-CM | POA: Diagnosis not present

## 2021-03-19 DIAGNOSIS — M9903 Segmental and somatic dysfunction of lumbar region: Secondary | ICD-10-CM | POA: Diagnosis not present

## 2021-03-19 DIAGNOSIS — M9901 Segmental and somatic dysfunction of cervical region: Secondary | ICD-10-CM | POA: Diagnosis not present

## 2021-03-19 DIAGNOSIS — M9906 Segmental and somatic dysfunction of lower extremity: Secondary | ICD-10-CM | POA: Diagnosis not present

## 2021-03-24 ENCOUNTER — Other Ambulatory Visit: Payer: Self-pay | Admitting: Pharmacist

## 2021-03-24 ENCOUNTER — Telehealth: Payer: Self-pay

## 2021-03-24 ENCOUNTER — Ambulatory Visit: Payer: Medicare Other

## 2021-03-24 ENCOUNTER — Other Ambulatory Visit: Payer: Self-pay

## 2021-03-24 DIAGNOSIS — Z79899 Other long term (current) drug therapy: Secondary | ICD-10-CM

## 2021-03-24 DIAGNOSIS — H209 Unspecified iridocyclitis: Secondary | ICD-10-CM

## 2021-03-24 DIAGNOSIS — D869 Sarcoidosis, unspecified: Secondary | ICD-10-CM

## 2021-03-24 NOTE — Progress Notes (Signed)
Next infusion scheduled for Remicade on 04/01/21 and due for updated orders. Diagnosis: uveitis and sarcoidosis. She also takes MTX 20mg  weekly  Dose: 3mg /kg every 6 weeks (200mg  based on last recorded weight of 58.1 kg)  Last Clinic Visit: 12/19/20 Next Clinic Visit: 05/22/21  Last infusion: 02/18/21 Labs: CBC and CMP on 02/18/21 were wnl TB Gold: negative on 07/23/20   Orders placed for Remicade 3mg /kg x 2 doses along with premedication of Tylenol and Benadryl to be administered 30 minutes before medication infusion.'  Standing CBC with diff/platelet and CMP with GFR orders placed to be drawn every 3 months (with every other infusion).  Next TB gold due October 2022).  04/20/21, PharmD, MPH Clinical Pharmacist (Rheumatology and Pulmonology)

## 2021-03-24 NOTE — Progress Notes (Deleted)
Subjective:   Renee Jones is a 70 y.o. female who presents for Medicare Annual (Subsequent) preventive examination.  Review of Systems: N/A      I connected with the patient today by telephone and verified that I am speaking with the correct person using two identifiers. Location patient: home Location nurse: work Persons participating in the telephone visit: patient, nurse.   I discussed the limitations, risks, security and privacy concerns of performing an evaluation and management service by telephone and the availability of in person appointments. I also discussed with the patient that there may be a patient responsible charge related to this service. The patient expressed understanding and verbally consented to this telephonic visit.              Objective:    There were no vitals filed for this visit. There is no height or weight on file to calculate BMI.  Advanced Directives 02/13/2020 03/08/2015 07/07/2014  Does Patient Have a Medical Advance Directive? Yes No No  Type of Estate agent of [place];Living will - -  Copy of Healthcare Power of Attorney in Chart? No - copy requested - -  Would patient like information on creating a medical advance directive? - - No - patient declined information    Current Medications (verified) Outpatient Encounter Medications as of 03/24/2021  Medication Sig  . albuterol (VENTOLIN HFA) 108 (90 Base) MCG/ACT inhaler   . amLODipine (NORVASC) 5 MG tablet TAKE 1 TABLET(5 MG) BY MOUTH DAILY FOR BLOOD PRESSURE  . cholecalciferol (VITAMIN D) 1000 units tablet Take 1,000 Units by mouth daily.  . cyclobenzaprine (FLEXERIL) 10 MG tablet TK 1 T PO Q 8 H PRF SPASM  . diclofenac sodium (VOLTAREN) 1 % GEL Apply three grams to three large joints up to three times daily. (Patient taking differently: Apply three grams to three large joints up to three times daily as needed)  . fluticasone (FLONASE) 50 MCG/ACT nasal spray  Place 2 sprays into both nostrils daily as needed for allergies or rhinitis.  . folic acid (FOLVITE) 1 MG tablet Take 2 tablets by mouth once daily  . InFLIXimab (REMICADE IV) Inject into the vein every 6 (six) weeks.  . methotrexate 2.5 MG tablet TAKE 10 TABLETS BY MOUTH ONCE A WEEK (Patient taking differently: 8 tablets weekly)  . metoprolol tartrate (LOPRESSOR) 50 MG tablet TAKE 1 AND 1/2 TABLETS BY MOUTH TWICE DAILY  . naproxen sodium (ALEVE) 220 MG tablet   . ondansetron (ZOFRAN) 4 MG tablet TAKE 1 TABLET BY MOUTH EVERY 6 HOURS AS NEEDED FOR NAUSEA AND VOMITING  . pantoprazole (PROTONIX) 40 MG tablet TAKE 1 TABLET BY MOUTH ONCE DAILY  . pravastatin (PRAVACHOL) 40 MG tablet Take 1 tablet (40 mg total) by mouth daily. For cholesterol.  . SYSTANE ULTRA 0.4-0.3 % SOLN SMARTSIG:1 Drop(s) In Eye(s) 6 Times Daily  . White Petrolatum-Mineral Oil (SYSTANE NIGHTTIME) OINT SMARTSIG:1 Inch(es) In Eye(s) Every Night   No facility-administered encounter medications on file as of 03/24/2021.    Allergies (verified) Codeine, Amoxicillin-pot clavulanate, Erythromycin base, Other, Sulfa antibiotics, Tramadol, Tramadol hcl, and Erythromycin   History: Past Medical History:  Diagnosis Date  . Allergy   . Asthma   . Bronchial spasm 09/14/2016  . DDD (degenerative disc disease), lumbar 09/14/2016  . Detached retina 09/14/2016  . Diastolic dysfunction 08/28/2015  . Essential hypertension 08/28/2015  . GERD (gastroesophageal reflux disease)   . Glaucoma 09/14/2016  . Neuromuscular disorder (HCC)   . Osteoarthritis  of both hands 09/14/2016  . Osteoarthritis of both knees 09/14/2016  . Pure hypercholesterolemia 02/20/2021  . Sarcoidosis    with +lymph node biopsy  . SVT (supraventricular tachycardia) (HCC) 08/28/2015  . Tachycardia   . Uveitis    Past Surgical History:  Procedure Laterality Date  . CATARACT EXTRACTION, BILATERAL  2018  . CYSTECTOMY    . EYE SURGERY Bilateral 2018   cataract removal    . LUMBAR SPINE SURGERY  2009  . ROTATOR CUFF REPAIR Right 2011  . ROTATOR CUFF REPAIR Left 06/2018  . TONSILLECTOMY AND ADENOIDECTOMY  1962  . TOTAL ABDOMINAL HYSTERECTOMY  1984   Family History  Adopted: Yes  Problem Relation Age of Onset  . Scleroderma Daughter    Social History   Socioeconomic History  . Marital status: Widowed    Spouse name: Not on file  . Number of children: Not on file  . Years of education: Not on file  . Highest education level: Not on file  Occupational History  . Not on file  Tobacco Use  . Smoking status: Never Smoker  . Smokeless tobacco: Never Used  Vaping Use  . Vaping Use: Never used  Substance and Sexual Activity  . Alcohol use: No    Alcohol/week: 0.0 standard drinks  . Drug use: No  . Sexual activity: Not on file  Other Topics Concern  . Not on file  Social History Narrative   Widower.   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   Social Determinants of Health   Financial Resource Strain: Not on file  Food Insecurity: Not on file  Transportation Needs: Not on file  Physical Activity: Not on file  Stress: Not on file  Social Connections: Not on file    Tobacco Counseling Counseling given: Not Answered   Clinical Intake:                 Diabetic: No Nutrition Risk Assessment:  Has the patient had any N/V/D within the last 2 months?  {YES/NO:21197} Does the patient have any non-healing wounds?  {YES/NO:21197} Has the patient had any unintentional weight loss or weight gain?  {YES/NO:21197}  Diabetes:  Is the patient diabetic?  No  If diabetic, was a CBG obtained today?  N/A Did the patient bring in their glucometer from home?  N/A How often do you monitor your CBG's? N/A.   Financial Strains and Diabetes Management:  Are you having any financial strains with the device, your supplies or your medication? N/A.  Does the patient want to be seen by Chronic Care Management for management of  their diabetes?  N/A Would the patient like to be referred to a Nutritionist or for Diabetic Management?  N/A         Activities of Daily Living No flowsheet data found.  Patient Care Team: Doreene Nest, NP as PCP - General (Internal Medicine) Chilton Si, MD as PCP - Cardiology (Cardiology) [facility]  Indicate any recent Medical Services you may have received from other than [facility] providers in the past year (date may be approximate).     Assessment:   This is a routine wellness examination for Renee Jones.  Hearing/Vision screen No exam data present  Dietary issues and exercise activities discussed:    Goals Addressed   None    Depression Screen PHQ 2/9 Scores 02/13/2020  PHQ - 2 Score 0  PHQ- 9 Score 0    Fall Risk Fall  Risk  02/13/2020  Falls in the past year? 0  Number falls in past yr: 0  Injury with Fall? 0  Risk for fall due to : Medication side effect  Follow up Falls evaluation completed;Falls prevention discussed    FALL RISK PREVENTION PERTAINING TO THE HOME:  Any stairs in or around the home? Yes  If so, are there any without handrails? No  Home free of loose throw rugs in walkways, pet beds, electrical cords, etc? Yes  Adequate lighting in your home to reduce risk of falls? Yes   ASSISTIVE DEVICES UTILIZED TO PREVENT FALLS:  Life alert? {YES/NO:21197} Use of a cane, walker or w/c? {YES/NO:21197} Grab bars in the bathroom? {YES/NO:21197} Shower chair or bench in shower? {YES/NO:21197} Elevated toilet seat or a handicapped toilet? {YES/NO:21197}  TIMED UP AND GO:  Was the test performed? N/A telephone visit .    Cognitive Function: MMSE - Mini Mental State Exam 02/13/2020  Orientation to time 5  Orientation to Place 5  Registration 3  Attention/ Calculation 5  Recall 3  Language- repeat 1  Mini Cog  Mini-Cog screen was completed. Maximum score is 22. A value of 0 denotes this part of the MMSE was not  completed or the patient failed this part of the Mini-Cog screening.       Immunizations Immunization History  Administered Date(s) Administered  . DTaP 07/26/2010  . Influenza Split 07/19/2014  . Influenza,inj,Quad PF,6+ Mos 07/19/2016, 12/21/2018  . Influenza-Unspecified 07/09/2019  . Pneumococcal Conjugate-13 01/21/2015  . Pneumococcal Polysaccharide-23 09/19/2012, 09/21/2012  . Pneumococcal-Unspecified 07/26/2012, 01/18/2015  . Td 03/31/2010  . Tdap 05/07/2010  . Varicella 09/19/2012  . Zoster, Live 07/26/2012, 09/21/2012    TDAP status: Due, Education has been provided regarding the importance of this vaccine. Advised may receive this vaccine at local pharmacy or Health Dept. Aware to provide a copy of the vaccination record if obtained from local pharmacy or Health Dept. Verbalized acceptance and understanding.  Flu Vaccine status: due Fall 2022  {Pneumococcal vaccine status:2101807}  {Covid-19 vaccine status:2101808}  Qualifies for Shingles Vaccine? Yes   Zostavax completed Yes   Shingrix Completed?: No.    Education has been provided regarding the importance of this vaccine. Patient has been advised to call insurance company to determine out of pocket expense if they have not yet received this vaccine. Advised may also receive vaccine at local pharmacy or Health Dept. Verbalized acceptance and understanding.  Screening Tests Health Maintenance  Topic Date Due  . COVID-19 Vaccine (1) Never done  . Pneumococcal Vaccine 39-48 Years old (1 of 4 - PCV13) Never done  . Zoster Vaccines- Shingrix (1 of 2) Never done  . PNA vac Low Risk Adult (2 of 2 - PPSV23) 01/18/2020  . TETANUS/TDAP  05/07/2020  . INFLUENZA VACCINE  05/19/2021  . MAMMOGRAM  08/22/2022  . COLONOSCOPY (Pts 45-59yrs Insurance coverage will need to be confirmed)  02/24/2027  . DEXA SCAN  Completed  . Hepatitis C Screening  Completed  . HPV VACCINES  Aged Out    Health Maintenance  Health Maintenance  Due  Topic Date Due  . COVID-19 Vaccine (1) Never done  . Pneumococcal Vaccine 7-67 Years old (1 of 4 - PCV13) Never done  . Zoster Vaccines- Shingrix (1 of 2) Never done  . PNA vac Low Risk Adult (2 of 2 - PPSV23) 01/18/2020  . TETANUS/TDAP  05/07/2020    Colorectal cancer screening: Type of screening: Colonoscopy. Completed 02/23/2017. Repeat every 10 years  Mammogram status: Completed 08/22/2020. Repeat every year  Bone Density status: Completed 11/01/2020. Results reflect: Bone density results: NORMAL. Repeat every 2-5 years.  Lung Cancer Screening: (Low Dose CT Chest recommended if Age 14-80 years, 30 pack-year currently smoking OR have quit w/in 15 years.) does not qualify.   Additional Screening:  Hepatitis C Screening: does qualify; Completed 06/25/2020  Vision Screening: Recommended annual ophthalmology exams for early detection of glaucoma and other disorders of the eye. Is the patient up to date with their annual eye exam?  {YES/NO:21197} Who is the provider or what is the name of the office in which the patient attends annual eye exams? *** If pt is not established with a provider, would they like to be referred to a provider to establish care? No .   Dental Screening: Recommended annual dental exams for proper oral hygiene  Community Resource Referral / Chronic Care Management: CRR required this visit?  No   CCM required this visit?  No      Plan:     I have personally reviewed and noted the following in the patient's chart:   . Medical and social history . Use of alcohol, tobacco or illicit drugs  . Current medications and supplements including opioid prescriptions.  . Functional ability and status . Nutritional status . Physical activity . Advanced directives . List of other physicians . Hospitalizations, surgeries, and ER visits in previous 12 months . Vitals . Screenings to include cognitive, depression, and falls . Referrals and appointments  In  addition, I have reviewed and discussed with patient certain preventive protocols, quality metrics, and best practice recommendations. A written personalized care plan for preventive services as well as general preventive health recommendations were provided to patient.   Due to this being a telephonic visit, the after visit summary with patients personalized plan was offered to patient via office or my-chart. Patient preferred to pick up at office at next visit or via mychart.   Janalyn Shy, LPN   [id number]/0/9811

## 2021-03-24 NOTE — Telephone Encounter (Signed)
Called patient 3 times trying to complete her Medicare Wellness visit. Patient never answered. Left message on voicemail notifying patient I called and to call to reschedule at her convenience.

## 2021-03-27 DIAGNOSIS — M9902 Segmental and somatic dysfunction of thoracic region: Secondary | ICD-10-CM | POA: Diagnosis not present

## 2021-03-27 DIAGNOSIS — M9901 Segmental and somatic dysfunction of cervical region: Secondary | ICD-10-CM | POA: Diagnosis not present

## 2021-03-27 DIAGNOSIS — M9906 Segmental and somatic dysfunction of lower extremity: Secondary | ICD-10-CM | POA: Diagnosis not present

## 2021-03-27 DIAGNOSIS — M9903 Segmental and somatic dysfunction of lumbar region: Secondary | ICD-10-CM | POA: Diagnosis not present

## 2021-03-28 DIAGNOSIS — M5416 Radiculopathy, lumbar region: Secondary | ICD-10-CM | POA: Diagnosis not present

## 2021-03-31 DIAGNOSIS — K5900 Constipation, unspecified: Secondary | ICD-10-CM | POA: Diagnosis not present

## 2021-03-31 DIAGNOSIS — K219 Gastro-esophageal reflux disease without esophagitis: Secondary | ICD-10-CM | POA: Diagnosis not present

## 2021-04-01 ENCOUNTER — Ambulatory Visit (HOSPITAL_COMMUNITY)
Admission: RE | Admit: 2021-04-01 | Discharge: 2021-04-01 | Disposition: A | Discharge status: Home / Self Care | Payer: Medicare Other | Source: Ambulatory Visit | Attending: Rheumatology | Admitting: Rheumatology

## 2021-04-01 DIAGNOSIS — D869 Sarcoidosis, unspecified: Secondary | ICD-10-CM

## 2021-04-01 DIAGNOSIS — H209 Unspecified iridocyclitis: Secondary | ICD-10-CM

## 2021-04-01 MED ORDER — DIPHENHYDRAMINE HCL 25 MG PO CAPS: 25.0000 mg | ORAL_CAPSULE | ORAL | Status: DC

## 2021-04-01 MED ORDER — INFLIXIMAB 100 MG IV SOLR
200.0000 mg | INTRAVENOUS | Status: DC
  Administered 2021-04-01: 200 mg via INTRAVENOUS
  Filled 2021-04-01: qty 20

## 2021-04-01 MED ORDER — ACETAMINOPHEN 325 MG PO TABS: 650.0000 mg | ORAL_TABLET | ORAL | Status: DC

## 2021-04-09 ENCOUNTER — Other Ambulatory Visit: Payer: Self-pay

## 2021-04-09 ENCOUNTER — Ambulatory Visit (INDEPENDENT_AMBULATORY_CARE_PROVIDER_SITE_OTHER): Payer: Medicare Other

## 2021-04-09 DIAGNOSIS — Z0000 Encounter for general adult medical examination without abnormal findings: Secondary | ICD-10-CM

## 2021-04-09 NOTE — Patient Instructions (Signed)
[name] , Thank you for taking time to come for your Medicare Wellness Visit. I appreciate your ongoing commitment to your health goals. Please review the following plan we discussed and let me know if I can assist you in the future.   Screening recommendations/referrals: Colonoscopy: Up to date, completed 02/23/2017, due 02/2027 Mammogram: Up to date, completed 08/22/2020, due 08/2021 Bone Density: Up to date, completed 11/01/2020, normal 2-5 years  Recommended yearly ophthalmology/optometry visit for glaucoma screening and checkup Recommended yearly dental visit for hygiene and checkup  Vaccinations: Influenza vaccine: due Fall 2022  Pneumococcal vaccine: due, will get at next visit or pharmacy Tdap vaccine: decline-insurance  Shingles vaccine: due, check with your insurance regarding coverage if interested    Covid-19:declined  Advanced directives: Please bring a copy of your POA (Power of [place]) and/or Living Will to your next appointment.   Conditions/risks identified: hypertension, hypercholesterolemia   Next appointment: Follow up in one year for your annual wellness visit    Preventive Care 65 Years and Older, Female Preventive care refers to lifestyle choices and visits with your health care provider that can promote health and wellness. What does preventive care include? A yearly physical exam. This is also called an annual well check. Dental exams once or twice a year. Routine eye exams. Ask your health care provider how often you should have your eyes checked. Personal lifestyle choices, including: Daily care of your teeth and gums. Regular physical activity. Eating a healthy diet. Avoiding tobacco and drug use. Limiting alcohol use. Practicing safe sex. Taking low-dose aspirin every day. Taking vitamin and mineral supplements as recommended by your health care provider. What happens during an annual well check? The services and screenings done by your health  care provider during your annual well check will depend on your age, overall health, lifestyle risk factors, and family history of disease. Counseling  Your health care provider may ask you questions about your: Alcohol use. Tobacco use. Drug use. Emotional well-being. Home and relationship well-being. Sexual activity. Eating habits. History of falls. Memory and ability to understand (cognition). Work and work Astronomer. Reproductive health. Screening  You may have the following tests or measurements: Height, weight, and BMI. Blood pressure. Lipid and cholesterol levels. These may be checked every 5 years, or more frequently if you are over 70 years old. Skin check. Lung cancer screening. You may have this screening every year starting at age 70 if you have a 30-pack-year history of smoking and currently smoke or have quit within the past 15 years. Fecal occult blood test (FOBT) of the stool. You may have this test every year starting at age 70. Flexible sigmoidoscopy or colonoscopy. You may have a sigmoidoscopy every 5 years or a colonoscopy every 10 years starting at age 70. Hepatitis C blood test. Hepatitis B blood test. Sexually transmitted disease (STD) testing. Diabetes screening. This is done by checking your blood sugar (glucose) after you have not eaten for a while (fasting). You may have this done every 1-3 years. Bone density scan. This is done to screen for osteoporosis. You may have this done starting at age 70. Mammogram. This may be done every 1-2 years. Talk to your health care provider about how often you should have regular mammograms. Talk with your health care provider about your test results, treatment options, and if necessary, the need for more tests. Vaccines  Your health care provider may recommend certain vaccines, such as: Influenza vaccine. This is recommended every year. Tetanus,  diphtheria, and acellular pertussis (Tdap, Td) vaccine. You may need a Td  booster every 10 years. Zoster vaccine. You may need this after age 70. Pneumococcal 13-valent conjugate (PCV13) vaccine. One dose is recommended after age 70. Pneumococcal polysaccharide (PPSV23) vaccine. One dose is recommended after age 70. Talk to your health care provider about which screenings and vaccines you need and how often you need them. This information is not intended to replace advice given to you by your health care provider. Make sure you discuss any questions you have with your health care provider. Document Released: 11/01/2015 Document Revised: 06/24/2016 Document Reviewed: 08/06/2015 Elsevier Interactive Patient Education  2017 [place] Prevention in the Home Falls can cause injuries. They can happen to people of all ages. There are many things you can do to make your home safe and to help prevent falls. What can I do on the outside of my home? Regularly fix the edges of walkways and driveways and fix any cracks. Remove anything that might make you trip as you walk through a door, such as a raised step or threshold. Trim any bushes or trees on the path to your home. Use bright outdoor lighting. Clear any walking paths of anything that might make someone trip, such as rocks or tools. Regularly check to see if handrails are loose or broken. Make sure that both sides of any steps have handrails. Any raised decks and porches should have guardrails on the edges. Have any leaves, snow, or ice cleared regularly. Use sand or salt on walking paths during winter. Clean up any spills in your garage right away. This includes oil or grease spills. What can I do in the bathroom? Use night lights. Install grab bars by the toilet and in the tub and shower. Do not use towel bars as grab bars. Use non-skid mats or decals in the tub or shower. If you need to sit down in the shower, use a plastic, non-slip stool. Keep the floor dry. Clean up any water that spills on the floor  as soon as it happens. Remove soap buildup in the tub or shower regularly. Attach bath mats securely with double-sided non-slip rug tape. Do not have throw rugs and other things on the floor that can make you trip. What can I do in the bedroom? Use night lights. Make sure that you have a light by your bed that is easy to reach. Do not use any sheets or blankets that are too big for your bed. They should not hang down onto the floor. Have a firm chair that has side arms. You can use this for support while you get dressed. Do not have throw rugs and other things on the floor that can make you trip. What can I do in the kitchen? Clean up any spills right away. Avoid walking on wet floors. Keep items that you use a lot in easy-to-reach places. If you need to reach something above you, use a strong step stool that has a grab bar. Keep electrical cords out of the way. Do not use floor polish or wax that makes floors slippery. If you must use wax, use non-skid floor wax. Do not have throw rugs and other things on the floor that can make you trip. What can I do with my stairs? Do not leave any items on the stairs. Make sure that there are handrails on both sides of the stairs and use them. Fix handrails that are broken or loose. Make  sure that handrails are as long as the stairways. Check any carpeting to make sure that it is firmly attached to the stairs. Fix any carpet that is loose or worn. Avoid having throw rugs at the top or bottom of the stairs. If you do have throw rugs, attach them to the floor with carpet tape. Make sure that you have a light switch at the top of the stairs and the bottom of the stairs. If you do not have them, ask someone to add them for you. What else can I do to help prevent falls? Wear shoes that: Do not have high heels. Have rubber bottoms. Are comfortable and fit you well. Are closed at the toe. Do not wear sandals. If you use a stepladder: Make sure that it is  fully opened. Do not climb a closed stepladder. Make sure that both sides of the stepladder are locked into place. Ask someone to hold it for you, if possible. Clearly mark and make sure that you can see: Any grab bars or handrails. First and last steps. Where the edge of each step is. Use tools that help you move around (mobility aids) if they are needed. These include: Canes. Walkers. Scooters. Crutches. Turn on the lights when you go into a dark area. Replace any light bulbs as soon as they burn out. Set up your furniture so you have a clear path. Avoid moving your furniture around. If any of your floors are uneven, fix them. If there are any pets around you, be aware of where they are. Review your medicines with your doctor. Some medicines can make you feel dizzy. This can increase your chance of falling. Ask your doctor what other things that you can do to help prevent falls. This information is not intended to replace advice given to you by your health care provider. Make sure you discuss any questions you have with your health care provider. Document Released: 08/01/2009 Document Revised: 03/12/2016 Document Reviewed: 11/09/2014 Elsevier Interactive Patient Education  2017 [employer].

## 2021-04-09 NOTE — Progress Notes (Signed)
Subjective:   Renee Jones is a 70 y.o. female who presents for Medicare Annual (Subsequent) preventive examination.  Review of Systems: N/A      I connected with the patient today by telephone and verified that I am speaking with the correct person using two identifiers. Location patient: home Location nurse: work Persons participating in the telephone visit: patient, nurse.   I discussed the limitations, risks, security and privacy concerns of performing an evaluation and management service by telephone and the availability of in person appointments. I also discussed with the patient that there may be a patient responsible charge related to this service. The patient expressed understanding and verbally consented to this telephonic visit.        Cardiac Risk Factors include: advanced age (>53men, >58 women);hypertension;Other (see comment), Risk factor comments: hypercholesterolemia     Objective:    Today's Vitals   There is no height or weight on file to calculate BMI.  Advanced Directives 04/09/2021 02/13/2020 03/08/2015 07/07/2014  Does Patient Have a Medical Advance Directive? Yes Yes No No  Type of Estate agent of [place];Living will Healthcare Power of [place];Living will - -  Copy of Healthcare Power of Attorney in Chart? No - copy requested No - copy requested - -  Would patient like information on creating a medical advance directive? - - - No - patient declined information    Current Medications (verified) Outpatient Encounter Medications as of 04/09/2021  Medication Sig   albuterol (VENTOLIN HFA) 108 (90 Base) MCG/ACT inhaler    amLODipine (NORVASC) 5 MG tablet TAKE 1 TABLET(5 MG) BY MOUTH DAILY FOR BLOOD PRESSURE   cholecalciferol (VITAMIN D) 1000 units tablet Take 1,000 Units by mouth daily.   cyclobenzaprine (FLEXERIL) 10 MG tablet TK 1 T PO Q 8 H PRF SPASM   diclofenac sodium (VOLTAREN) 1 % GEL Apply three grams to three large  joints up to three times daily. (Patient taking differently: Apply three grams to three large joints up to three times daily as needed)   fluticasone (FLONASE) 50 MCG/ACT nasal spray Place 2 sprays into both nostrils daily as needed for allergies or rhinitis.   folic acid (FOLVITE) 1 MG tablet Take 2 tablets by mouth once daily   InFLIXimab (REMICADE IV) Inject into the vein every 6 (six) weeks.   methotrexate 2.5 MG tablet TAKE 10 TABLETS BY MOUTH ONCE A WEEK (Patient taking differently: 8 tablets weekly)   metoprolol tartrate (LOPRESSOR) 50 MG tablet TAKE 1 AND 1/2 TABLETS BY MOUTH TWICE DAILY   naproxen sodium (ALEVE) 220 MG tablet    ondansetron (ZOFRAN) 4 MG tablet TAKE 1 TABLET BY MOUTH EVERY 6 HOURS AS NEEDED FOR NAUSEA AND VOMITING   pantoprazole (PROTONIX) 40 MG tablet TAKE 1 TABLET BY MOUTH ONCE DAILY   pravastatin (PRAVACHOL) 40 MG tablet Take 1 tablet (40 mg total) by mouth daily. For cholesterol.   SYSTANE ULTRA 0.4-0.3 % SOLN SMARTSIG:1 Drop(s) In Eye(s) 6 Times Daily   White Petrolatum-Mineral Oil (SYSTANE NIGHTTIME) OINT SMARTSIG:1 Inch(es) In Eye(s) Every Night   No facility-administered encounter medications on file as of 04/09/2021.    Allergies (verified) Codeine, Amoxicillin-pot clavulanate, Erythromycin base, Other, Sulfa antibiotics, Tramadol, Tramadol hcl, and Erythromycin   History: Past Medical History:  Diagnosis Date   Allergy    Asthma    Bronchial spasm 09/14/2016   DDD (degenerative disc disease), lumbar 09/14/2016   Detached retina 09/14/2016   Diastolic dysfunction 08/28/2015   Essential hypertension  08/28/2015   GERD (gastroesophageal reflux disease)    Glaucoma 09/14/2016   Neuromuscular disorder (HCC)    Osteoarthritis of both hands 09/14/2016   Osteoarthritis of both knees 09/14/2016   Pure hypercholesterolemia 02/20/2021   Sarcoidosis    with +lymph node biopsy   SVT (supraventricular tachycardia) (HCC) 08/28/2015   Tachycardia    Uveitis     Past Surgical History:  Procedure Laterality Date   CATARACT EXTRACTION, BILATERAL  2018   CYSTECTOMY     EYE SURGERY Bilateral 2018   cataract removal    LUMBAR SPINE SURGERY  2009   ROTATOR CUFF REPAIR Right 2011   ROTATOR CUFF REPAIR Left 06/2018   TONSILLECTOMY AND ADENOIDECTOMY  1962   TOTAL ABDOMINAL HYSTERECTOMY  1984   Family History  Adopted: Yes  Problem Relation Age of Onset   Scleroderma Daughter    Social History   Socioeconomic History   Marital status: Widowed    Spouse name: Not on file   Number of children: Not on file   Years of education: Not on file   Highest education level: Not on file  Occupational History   Not on file  Tobacco Use   Smoking status: Never   Smokeless tobacco: Never  Vaping Use   Vaping Use: Never used  Substance and Sexual Activity   Alcohol use: No    Alcohol/week: 0.0 standard drinks   Drug use: No   Sexual activity: Not on file  Other Topics Concern   Not on file  Social History Narrative   Widower.   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   Social Determinants of Health   Financial Resource Strain: Low Risk    Difficulty of Paying Living Expenses: Not hard at all  Food Insecurity: No Food Insecurity   Worried About Programme researcher, broadcasting/film/video in the Last Year: Never true   Ran Out of Food in the Last Year: Never true  Transportation Needs: No Transportation Needs   Lack of Transportation (Medical): No   Lack of Transportation (Non-Medical): No  Physical Activity: Insufficiently Active   Days of Exercise per Week: 3 days   Minutes of Exercise per Session: 30 min  Stress: No Stress Concern Present   Feeling of Stress : Not at all  Social Connections: Not on file    Tobacco Counseling Counseling given: Not Answered   Clinical Intake:  Pre-visit preparation completed: Yes  Pain : 0-10 Pain Type: Chronic pain Pain Location: Back (left leg and hip) Pain Descriptors / Indicators:  Aching Pain Onset: More than a month ago Pain Frequency: Intermittent     Nutritional Risks: None Diabetes: No  How often do you need to have someone help you when you read instructions, pamphlets, or other written materials from your doctor or pharmacy?: 1 - Never  Diabetic: No Nutrition Risk Assessment:  Has the patient had any N/V/D within the last 2 months?  No  Does the patient have any non-healing wounds?  No  Has the patient had any unintentional weight loss or weight gain?  No   Diabetes:  Is the patient diabetic?   No If diabetic, was a CBG obtained today?   N/A Did the patient bring in their glucometer from home?   N/A How often do you monitor your CBG's? N/a.   Financial Strains and Diabetes Management:  Are you having any financial strains with the device, your supplies or your medication?  N/A .  Does  the patient want to be seen by Chronic Care Management for management of their diabetes?   N/A Would the patient like to be referred to a Nutritionist or for Diabetic Management?   N/A  Interpreter Needed?: No  Information entered by :: CJohnson, LPN   Activities of Daily Living In your present state of health, do you have any difficulty performing the following activities: 04/09/2021  Hearing? N  Vision? N  Difficulty concentrating or making decisions? N  Walking or climbing stairs? N  Dressing or bathing? N  Doing errands, shopping? N  Preparing Food and eating ? N  Using the Toilet? N  In the past six months, have you accidently leaked urine? N  Do you have problems with loss of bowel control? N  Managing your Medications? N  Managing your Finances? N  Housekeeping or managing your Housekeeping? N  Some recent data might be hidden    Patient Care Team: Doreene Nest, NP as PCP - General (Internal Medicine) Chilton Si, MD as PCP - Cardiology (Cardiology) [facility]  Indicate any recent Medical Services you may have  received from other than [facility] providers in the past year (date may be approximate).     Assessment:   This is a routine wellness examination for Youlanda.  Hearing/Vision screen Vision Screening - Comments:: Patient gets annual eye exams   Dietary issues and exercise activities discussed: Current Exercise Habits: Home exercise routine, Type of exercise: walking, Time (Minutes): 30, Frequency (Times/Week): 3, Weekly Exercise (Minutes/Week): 90, Intensity: Moderate, Exercise limited by: None identified   Goals Addressed             This Visit's Progress    Patient Stated       04/09/2021, I will continue to walk 3 days a week for 1 mile        Depression Screen PHQ 2/9 Scores 04/09/2021 02/13/2020  PHQ - 2 Score 0 0  PHQ- 9 Score 0 0    Fall Risk Fall Risk  04/09/2021 02/13/2020  Falls in the past year? 0 0  Number falls in past yr: 0 0  Injury with Fall? 0 0  Risk for fall due to : Medication side effect Medication side effect  Follow up Falls evaluation completed;Falls prevention discussed Falls evaluation completed;Falls prevention discussed    FALL RISK PREVENTION PERTAINING TO THE HOME:  Any stairs in or around the home? Yes  If so, are there any without handrails? No  Home free of loose throw rugs in walkways, pet beds, electrical cords, etc? Yes  Adequate lighting in your home to reduce risk of falls? Yes   ASSISTIVE DEVICES UTILIZED TO PREVENT FALLS:  Life alert? No  Use of a cane, walker or w/c? No  Grab bars in the bathroom? No  Shower chair or bench in shower? No  Elevated toilet seat or a handicapped toilet? No   TIMED UP AND GO:  Was the test performed?  N/A telephone visit  .   Cognitive Function: MMSE - Mini Mental State Exam 04/09/2021 02/13/2020  Orientation to time 5 5  Orientation to Place 5 5  Registration 3 3  Attention/ Calculation 5 5  Recall 3 3  Language- repeat 1 1  Mini Cog  Mini-Cog screen was completed. Maximum score is 22. A  value of 0 denotes this part of the MMSE was not completed or the patient failed this part of the Mini-Cog screening.       Immunizations  Immunization History  Administered Date(s) Administered   DTaP 07/26/2010   Influenza Split 07/19/2014   Influenza,inj,Quad PF,6+ Mos 07/19/2016, 12/21/2018   Influenza-Unspecified 07/09/2019   Pneumococcal Conjugate-13 01/21/2015   Pneumococcal Polysaccharide-23 09/19/2012, 09/21/2012   Pneumococcal-Unspecified 07/26/2012, 01/18/2015   Td 03/31/2010   Tdap 05/07/2010   Varicella 09/19/2012   Zoster, Live 07/26/2012, 09/21/2012    TDAP status: Due, Education has been provided regarding the importance of this vaccine. Advised may receive this vaccine at local pharmacy or Health Dept. Aware to provide a copy of the vaccination record if obtained from local pharmacy or Health Dept. Verbalized acceptance and understanding.  Flu Vaccine status: due Fall 2022  Pneumococcal vaccine status: Due, Education has been provided regarding the importance of this vaccine. Advised may receive this vaccine at local pharmacy or Health Dept. Aware to provide a copy of the vaccination record if obtained from local pharmacy or Health Dept. Verbalized acceptance and understanding.  Covid-19 vaccine status: Declined, Education has been provided regarding the importance of this vaccine but patient still declined. Advised may receive this vaccine at local pharmacy or Health Dept.or vaccine clinic. Aware to provide a copy of the vaccination record if obtained from local pharmacy or Health Dept. Verbalized acceptance and understanding.  Qualifies for Shingles Vaccine? Yes   Zostavax completed Yes   Shingrix Completed?: No.    Education has been provided regarding the importance of this vaccine. Patient has been advised to call insurance company to determine out of pocket expense if they have not yet received this vaccine. Advised may also receive vaccine at local pharmacy or  Health Dept. Verbalized acceptance and understanding.  Screening Tests Health Maintenance  Topic Date Due   Zoster Vaccines- Shingrix (1 of 2) Never done   PNA vac Low Risk Adult (2 of 2 - PPSV23) 01/18/2020   COVID-19 Vaccine (1) 04/25/2021 (Originally 08/17/1956)   TETANUS/TDAP  04/09/2024 (Originally 05/07/2020)   INFLUENZA VACCINE  05/19/2021   MAMMOGRAM  08/22/2022   COLONOSCOPY (Pts 45-84yrs Insurance coverage will need to be confirmed)  02/24/2027   DEXA SCAN  Completed   Hepatitis C Screening  Completed   HPV VACCINES  Aged Out    Health Maintenance  Health Maintenance Due  Topic Date Due   Zoster Vaccines- Shingrix (1 of 2) Never done   PNA vac Low Risk Adult (2 of 2 - PPSV23) 01/18/2020    Colorectal cancer screening: Type of screening: Colonoscopy. Completed 02/23/2017. Repeat every 10 years  Mammogram status: Completed 08/22/2020. Repeat every year  Bone Density status: Completed 11/01/2020. Results reflect: Bone density results: NORMAL. Repeat every 2-5 years.  Lung Cancer Screening: (Low Dose CT Chest recommended if Age 9-80 years, 30 pack-year currently smoking OR have quit w/in 15years.) does not qualify.   Additional Screening:  Hepatitis C Screening: does qualify; Completed 06/25/2020  Vision Screening: Recommended annual ophthalmology exams for early detection of glaucoma and other disorders of the eye. Is the patient up to date with their annual eye exam?  Yes  Who is the provider or what is the name of the office in which the patient attends annual eye exams? Dr. Dione Booze If pt is not established with a provider, would they like to be referred to a provider to establish care? No .   Dental Screening: Recommended annual dental exams for proper oral hygiene  Community Resource Referral / Chronic Care Management: CRR required this visit?  No   CCM required this visit?  No  Plan:     I have personally reviewed and noted the following in the patient's  chart:   Medical and social history Use of alcohol, tobacco or illicit drugs  Current medications and supplements including opioid prescriptions.  Functional ability and status Nutritional status Physical activity Advanced directives List of other physicians Hospitalizations, surgeries, and ER visits in previous 12 months Vitals Screenings to include cognitive, depression, and falls Referrals and appointments  In addition, I have reviewed and discussed with patient certain preventive protocols, quality metrics, and best practice recommendations. A written personalized care plan for preventive services as well as general preventive health recommendations were provided to patient.   Due to this being a telephonic visit, the after visit summary with patients personalized plan was offered to patient via office or my-chart. Patient preferred to pick up at office at next visit or via mychart.   Janalyn Shy, LPN   [id number]/89/3810

## 2021-04-09 NOTE — Progress Notes (Signed)
PCP notes:  Health Maintenance: Pneumococcal 23- due Tdap-insurance Shingrix- due Covid- declined   Abnormal Screenings: none   Patient concerns: none   Nurse concerns: none   Next PCP appt.: none

## 2021-04-16 ENCOUNTER — Other Ambulatory Visit: Payer: Self-pay | Admitting: Primary Care

## 2021-04-17 ENCOUNTER — Other Ambulatory Visit: Payer: Self-pay | Admitting: Rheumatology

## 2021-04-17 ENCOUNTER — Other Ambulatory Visit: Payer: Self-pay | Admitting: Physician Assistant

## 2021-04-17 DIAGNOSIS — M545 Low back pain, unspecified: Secondary | ICD-10-CM | POA: Diagnosis not present

## 2021-04-17 NOTE — Telephone Encounter (Signed)
Next Visit: 05/22/2021   Last Visit: 12/19/2020   Last Fill: 08/26/2020  Per protocol, okay to refill per Dr. Corliss Skains

## 2021-04-17 NOTE — Telephone Encounter (Signed)
Next Visit: 05/22/2021  Last Visit: 12/19/2020  Last Fill: 11/21/2020  DX:  Uveitis  Current Dose per office note 12/19/2020: methotrexate 2.5 mg 8 tablets every 7 days  Labs: 02/18/2021 Glucose is 109. Rest of CMP WNL. CBC WNL.   Per protocol okay to refill per Dr. Corliss Skains

## 2021-04-22 ENCOUNTER — Encounter: Payer: Self-pay | Admitting: Primary Care

## 2021-04-22 ENCOUNTER — Other Ambulatory Visit: Payer: Self-pay

## 2021-04-22 ENCOUNTER — Ambulatory Visit (INDEPENDENT_AMBULATORY_CARE_PROVIDER_SITE_OTHER): Payer: Medicare Other | Admitting: Primary Care

## 2021-04-22 VITALS — BP 118/70 | HR 61 | Temp 98.0°F | Ht 63.0 in | Wt 127.4 lb

## 2021-04-22 DIAGNOSIS — E785 Hyperlipidemia, unspecified: Secondary | ICD-10-CM

## 2021-04-22 DIAGNOSIS — R000 Tachycardia, unspecified: Secondary | ICD-10-CM | POA: Diagnosis not present

## 2021-04-22 DIAGNOSIS — R7303 Prediabetes: Secondary | ICD-10-CM | POA: Diagnosis not present

## 2021-04-22 DIAGNOSIS — M5136 Other intervertebral disc degeneration, lumbar region: Secondary | ICD-10-CM | POA: Diagnosis not present

## 2021-04-22 DIAGNOSIS — K219 Gastro-esophageal reflux disease without esophagitis: Secondary | ICD-10-CM

## 2021-04-22 DIAGNOSIS — I10 Essential (primary) hypertension: Secondary | ICD-10-CM | POA: Diagnosis not present

## 2021-04-22 DIAGNOSIS — D8686 Sarcoid arthropathy: Secondary | ICD-10-CM

## 2021-04-22 DIAGNOSIS — H209 Unspecified iridocyclitis: Secondary | ICD-10-CM

## 2021-04-22 LAB — LIPID PANEL
Cholesterol: 151 mg/dL (ref 0–200)
HDL: 59.1 mg/dL (ref >39.00)
LDL Cholesterol: 71 mg/dL (ref 0–99)
NonHDL: 92.34
Total CHOL/HDL Ratio: 3
Triglycerides: 107 mg/dL (ref 0.0–149.0)
VLDL: 21.4 mg/dL (ref 0.0–40.0)

## 2021-04-22 LAB — HEMOGLOBIN A1C: Hgb A1c MFr Bld: 6 % (ref 4.6–6.5)

## 2021-04-22 NOTE — Assessment & Plan Note (Signed)
Controlled, following with cardiology. Continue metoprolol tartrate 50 mg BID and BP control.

## 2021-04-22 NOTE — Assessment & Plan Note (Signed)
Managed on pravastatin 40 mg since April 2022, continue same. Repeat lipid panel pending.  Commended her on her dietary changes.

## 2021-04-22 NOTE — Progress Notes (Signed)
Subjective:    Patient ID: Renee Jones, female    DOB: Sep 25, 1951, 70 y.o.   MRN: [id number]  HPI  Renee Jones is a very pleasant 70 y.o. female with a history of hypertension, SVT, GERD, osteoarthritis, sarcoidosis, glaucoma, uveitis who presents today for follow up of chronic conditions.   Following with rheumatology for uveitis and sarcoidosis, managed on Remicade infusions and methotrexate 20 mg weekly. Doing well on this regimen.   Compliant to metoprolol tartrate 50 mg BID, amlodipine 5 mg for hypertension. Following with cardiology.   BP Readings from Last 3 Encounters:  04/22/21 118/70  04/01/21 (!) 146/72  02/20/21 (!) 141/68   Managed on pravastatin 40 mg, initiated in April 2022, due for repeat lipid panel. Evidence of aortic atherosclerosis on prior imaging. Since her last visit she is active, has been working on a healthy diet. She's limiting sodas, limiting sweets. Has noticed her clothes feeling loose.  Wt Readings from Last 3 Encounters:  04/22/21 127 lb 6.4 oz (57.8 kg)  04/01/21 127 lb (57.6 kg)  02/20/21 128 lb (58.1 kg)   Following with orthopedics, Dr. Shon Jones, underwent MRI recently, was managed on prednisone and gabapentin.   Follows with GI, recent follow up visit in June 2022, doing well on pantoprazole. Colonoscopy UTD.   Review of Systems  Respiratory:  Negative for shortness of breath.   Cardiovascular:  Negative for chest pain.  Neurological:  Negative for dizziness and headaches.        Past Medical History:  Diagnosis Date   Allergy    Asthma    Bronchial spasm 09/14/2016   DDD (degenerative disc disease), lumbar 09/14/2016   Detached retina 09/14/2016   Diastolic dysfunction 08/28/2015   Essential hypertension 08/28/2015   GERD (gastroesophageal reflux disease)    Glaucoma 09/14/2016   Neuromuscular disorder (HCC)    Osteoarthritis of both hands 09/14/2016   Osteoarthritis of both knees 09/14/2016   Pure  hypercholesterolemia 02/20/2021   Sarcoidosis    with +lymph node biopsy   SVT (supraventricular tachycardia) (HCC) 08/28/2015   Tachycardia    Uveitis     Social History   Socioeconomic History   Marital status: Widowed    Spouse name: Not on file   Number of children: Not on file   Years of education: Not on file   Highest education level: Not on file  Occupational History   Not on file  Tobacco Use   Smoking status: Never   Smokeless tobacco: Never  Vaping Use   Vaping Use: Never used  Substance and Sexual Activity   Alcohol use: No    Alcohol/week: 0.0 standard drinks   Drug use: No   Sexual activity: Yes  Other Topics Concern   Not on file  Social History Narrative   Widower.   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   Social Determinants of Health   Financial Resource Strain: Low Risk    Difficulty of Paying Living Expenses: Not hard at all  Food Insecurity: No Food Insecurity   Worried About Programme researcher, broadcasting/film/video in the Last Year: Never true   Ran Out of Food in the Last Year: Never true  Transportation Needs: No Transportation Needs   Lack of Transportation (Medical): No   Lack of Transportation (Non-Medical): No  Physical Activity: Insufficiently Active   Days of Exercise per Week: 3 days   Minutes of Exercise per Session: 30 min  Stress: No Stress Concern  Present   Feeling of Stress : Not at all  Social Connections: Not on file  Intimate Partner Violence: Not At Risk   Fear of Current or Ex-Partner: No   Emotionally Abused: No   Physically Abused: No   Sexually Abused: No    Past Surgical History:  Procedure Laterality Date   CATARACT EXTRACTION, BILATERAL  2018   CYSTECTOMY     EYE SURGERY Bilateral 2018   cataract removal    LUMBAR SPINE SURGERY  2009   ROTATOR CUFF REPAIR Right 2011   ROTATOR CUFF REPAIR Left 06/2018   TONSILLECTOMY AND ADENOIDECTOMY  1962   TOTAL ABDOMINAL HYSTERECTOMY  1984    Family History   Adopted: Yes  Problem Relation Age of Onset   Scleroderma Daughter     Allergies  Allergen Reactions   Codeine Itching and Hives    Other reaction(s): itching internally   Amoxicillin-Pot Clavulanate Diarrhea   Erythromycin Base    Other     Other reaction(s): Unknown Other reaction(s): itching internally   Sulfa Antibiotics Itching     vomiting   Tramadol Itching   Tramadol Hcl     Other reaction(s): hives   Erythromycin Itching and Hives    Other reaction(s): itching internally    Current Outpatient Medications on File Prior to Visit  Medication Sig Dispense Refill   albuterol (VENTOLIN HFA) 108 (90 Base) MCG/ACT inhaler      amLODipine (NORVASC) 5 MG tablet TAKE 1 TABLET(5 MG) BY MOUTH DAILY FOR BLOOD PRESSURE 90 tablet 0   cholecalciferol (VITAMIN D) 1000 units tablet Take 1,000 Units by mouth daily.     cyclobenzaprine (FLEXERIL) 10 MG tablet TK 1 T PO Q 8 H PRF SPASM     diclofenac sodium (VOLTAREN) 1 % GEL Apply three grams to three large joints up to three times daily. (Patient taking differently: Apply three grams to three large joints up to three times daily as needed) 3 Tube 3   fluticasone (FLONASE) 50 MCG/ACT nasal spray Place 2 sprays into both nostrils daily as needed for allergies or rhinitis.     folic acid (FOLVITE) 1 MG tablet Take 2 tablets by mouth once daily 180 tablet 3   InFLIXimab (REMICADE IV) Inject into the vein every 6 (six) weeks.     methotrexate 2.5 MG tablet Take 8 tablets (20 mg total) by mouth once a week. 96 tablet 0   metoprolol tartrate (LOPRESSOR) 50 MG tablet TAKE 1 AND 1/2 TABLETS BY MOUTH TWICE DAILY 270 tablet 0   naproxen sodium (ALEVE) 220 MG tablet      ondansetron (ZOFRAN) 4 MG tablet TAKE 1 TABLET BY MOUTH EVERY 6 HOURS AS NEEDED FOR NAUSEA AND VOMITING 30 tablet 0   pantoprazole (PROTONIX) 40 MG tablet TAKE 1 TABLET BY MOUTH ONCE DAILY 90 tablet 1   pravastatin (PRAVACHOL) 40 MG tablet Take 1 tablet (40 mg total) by mouth daily.  For cholesterol. 90 tablet 3   SYSTANE ULTRA 0.4-0.3 % SOLN SMARTSIG:1 Drop(s) In Eye(s) 6 Times Daily     White Petrolatum-Mineral Oil (SYSTANE NIGHTTIME) OINT SMARTSIG:1 Inch(es) In Eye(s) Every Night     No current facility-administered medications on file prior to visit.    BP 118/70   Pulse 61   Temp 98 F (36.7 C) (Temporal)   Ht 5\' 3"  (1.6 m)   Wt 127 lb 6.4 oz (57.8 kg)   SpO2 98%   BMI 22.57 kg/m  Objective:   Physical Exam  Cardiovascular:     Rate and Rhythm: Normal rate and regular rhythm.  Pulmonary:     Effort: Pulmonary effort is normal.     Breath sounds: Normal breath sounds.  Musculoskeletal:     Cervical back: Neck supple.  Skin:    General: Skin is warm and dry.          Assessment & Plan:      This visit occurred during the SARS-CoV-2 public health emergency.  Safety protocols were in place, including screening questions prior to the visit, additional usage of staff PPE, and extensive cleaning of exam room while observing appropriate contact time as indicated for disinfecting solutions.

## 2021-04-22 NOTE — Assessment & Plan Note (Signed)
Doing well on pantoprazole 40 mg, follows with GI. Continue current regimen.

## 2021-04-22 NOTE — Assessment & Plan Note (Signed)
Following with rheumatology, continue methotrexate 20 mg weekly and Remicade infusions.  

## 2021-04-22 NOTE — Assessment & Plan Note (Signed)
Commended her on lifestyle changes, encouraged to continue. Repeat A1C pending.

## 2021-04-22 NOTE — Patient Instructions (Signed)
Stop by the lab prior to leaving today. I will notify you of your results once received.   It was a pleasure to see you today!  

## 2021-04-22 NOTE — Assessment & Plan Note (Signed)
Following with rheumatology, continue methotrexate 20 mg weekly and Remicade infusions.

## 2021-04-22 NOTE — Assessment & Plan Note (Signed)
Well controlled in the office today, continue metoprolol tartrate 50 mg BID, amlodipine 5 mg.

## 2021-04-22 NOTE — Assessment & Plan Note (Signed)
Following with orthopedics. 

## 2021-04-28 DIAGNOSIS — M545 Low back pain, unspecified: Secondary | ICD-10-CM | POA: Diagnosis not present

## 2021-04-28 DIAGNOSIS — M533 Sacrococcygeal disorders, not elsewhere classified: Secondary | ICD-10-CM | POA: Diagnosis not present

## 2021-04-29 ENCOUNTER — Other Ambulatory Visit: Payer: Medicare Other

## 2021-05-08 NOTE — Progress Notes (Signed)
Office Visit Note  Patient: Renee Jones             Date of Birth: 1951/04/29           MRN: [id number]             PCP: Doreene Nest, NP Referring: Doreene Nest, NP Visit Date: 05/22/2021 Occupation: @GUAROCC @  Subjective:  Medication monitoring.   History of Present Illness: Renee Jones is a 70 y.o. female with a history of sarcoidosis, uveitis, osteoarthritis, degenerative disc disease.  She states she has been tolerating Remicade infusions and methotrexate well.  She denies any joint swelling.  She has been experiencing some discomfort in her right thumb which she describes over the [facility] joint.  She states her right finger has been numb.  She is scheduling an appointment with a hand surgeon at [facility].  She was also recently evaluated by Dr. Shon Baton at [facility] for lower back pain.  The MRI results are pending.  She denies any recurrence of uveitis.  She denies any shortness of breath.  She had good response to bilateral knee joint Hyalgan injections.  Activities of Daily Living:  Patient reports morning stiffness for 0 minutes.   Patient Reports nocturnal pain.  Difficulty dressing/grooming: Denies Difficulty climbing stairs: Denies Difficulty getting out of chair: Denies Difficulty using hands for taps, buttons, cutlery, and/or writing: Reports  Review of Systems  Constitutional:  Negative for fatigue.  HENT:  Negative for mouth sores, mouth dryness and nose dryness.   Eyes:  Positive for itching and dryness. Negative for pain and visual disturbance.  Respiratory:  Negative for cough, hemoptysis, shortness of breath and difficulty breathing.   Cardiovascular:  Negative for chest pain, palpitations and swelling in legs/feet.  Gastrointestinal:  Negative for abdominal pain, blood in stool, constipation and diarrhea.  Endocrine: Negative for increased urination.  Genitourinary:  Negative for painful urination.  Musculoskeletal:  Positive for  joint pain and joint pain. Negative for joint swelling, myalgias, muscle weakness, morning stiffness, muscle tenderness and myalgias.  Skin:  Negative for color change, rash and redness.  Allergic/Immunologic: Negative for susceptible to infections.  Neurological:  Positive for light-headedness and numbness. Negative for dizziness, headaches, memory loss and weakness.  Hematological:  Negative for swollen glands.  Psychiatric/Behavioral:  Negative for confusion and sleep disturbance.    PMFS History:  Patient Active Problem List   Diagnosis Date Noted   Hyperlipidemia 02/20/2021   Prediabetes 12/21/2018   High risk medications (not anticoagulants) long-term use 01/13/2017   GERD (gastroesophageal reflux disease) 09/14/2016   Bronchial spasm 09/14/2016   Glaucoma 09/14/2016   Detached retina 09/14/2016   Osteoarthritis of both hands 09/14/2016   Osteoarthritis of both knees 09/14/2016   DDD (degenerative disc disease), lumbar 09/14/2016   Uveitis 06/22/2016   SVT (supraventricular tachycardia) (HCC) 08/28/2015   Essential hypertension 08/28/2015   Diastolic dysfunction 08/28/2015   Sarcoidosis 09/30/2012   Pulmonary nodules 09/30/2012   Tachycardia 09/30/2012   Sarcoid arthropathy 09/30/2012    Past Medical History:  Diagnosis Date   Allergy    Asthma    Bronchial spasm 09/14/2016   DDD (degenerative disc disease), lumbar 09/14/2016   Detached retina 09/14/2016   Diastolic dysfunction 08/28/2015   Essential hypertension 08/28/2015   GERD (gastroesophageal reflux disease)    Glaucoma 09/14/2016   Neuromuscular disorder (HCC)    Osteoarthritis of both hands 09/14/2016   Osteoarthritis of both knees 09/14/2016   Pure hypercholesterolemia 02/20/2021   Sarcoidosis  with +lymph node biopsy   SVT (supraventricular tachycardia) (HCC) 08/28/2015   Tachycardia    Uveitis     Family History  Adopted: Yes  Problem Relation Age of Onset   Scleroderma Daughter    Past Surgical  History:  Procedure Laterality Date   CATARACT EXTRACTION, BILATERAL  2018   CYSTECTOMY     EYE SURGERY Bilateral 2018   cataract removal    LUMBAR SPINE SURGERY  2009   ROTATOR CUFF REPAIR Right 2011   ROTATOR CUFF REPAIR Left 06/2018   TONSILLECTOMY AND ADENOIDECTOMY  1962   TOTAL ABDOMINAL HYSTERECTOMY  1984   Social History   Social History Narrative   Widower.   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   Immunization History  Administered Date(s) Administered   DTaP 07/26/2010   Influenza Split 07/19/2014   Influenza,inj,Quad PF,6+ Mos 07/19/2016, 12/21/2018   Influenza-Unspecified 07/09/2019   Pneumococcal Conjugate-13 01/21/2015   Pneumococcal Polysaccharide-23 09/19/2012, 09/21/2012   Pneumococcal-Unspecified 07/26/2012, 01/18/2015   Td 03/31/2010   Tdap 05/07/2010   Varicella 09/19/2012   Zoster, Live 07/26/2012, 09/21/2012     Objective: Vital Signs: BP 114/65 (BP Location: Left Arm, Patient Position: Sitting, Cuff Size: Normal)   Pulse (!) 59   Ht 5' 3.5" (1.613 m)   Wt 129 lb (58.5 kg)   BMI 22.49 kg/m    Physical Exam Vitals and nursing note reviewed.  Constitutional:      Appearance: She is well-developed.  HENT:     Head: Normocephalic and atraumatic.  Eyes:     Conjunctiva/sclera: Conjunctivae normal.  Cardiovascular:     Rate and Rhythm: Normal rate and regular rhythm.     Heart sounds: Normal heart sounds.  Pulmonary:     Effort: Pulmonary effort is normal.     Breath sounds: Normal breath sounds.  Abdominal:     General: Bowel sounds are normal.     Palpations: Abdomen is soft.  Musculoskeletal:     Cervical back: Normal range of motion.  Lymphadenopathy:     Cervical: No cervical adenopathy.  Skin:    General: Skin is warm and dry.     Capillary Refill: Capillary refill takes less than 2 seconds.  Neurological:     Mental Status: She is alert and oriented to person, place, and time.  Psychiatric:         Behavior: Behavior normal.     Musculoskeletal Exam: C-spine was in good range of motion.  She had discomfort range of motion of her lumbar spine.  Shoulder joints, elbow joints, wrist joints, MCPs PIPs and DIPs with good range of motion with no synovitis.  Hip joints, knee joints, ankles, MTPs and PIPs with good range of motion with no synovitis.  CDAI Exam: CDAI Score: -- Patient Global: --; Provider Global: -- Swollen: --; Tender: -- Joint Exam 05/22/2021   No joint exam has been documented for this visit   There is currently no information documented on the homunculus. Go to the Rheumatology activity and complete the homunculus joint exam.  Investigation: No additional findings.  Imaging: No results found.  Recent Labs: Lab Results  Component Value Date   WBC 6.3 05/13/2021   HGB 13.4 05/13/2021   PLT 291 05/13/2021   NA 138 05/13/2021   K 4.0 05/13/2021   CL 109 05/13/2021   CO2 24 05/13/2021   GLUCOSE 112 (H) 05/13/2021   BUN 14 05/13/2021   CREATININE 0.85 05/13/2021   BILITOT  0.6 05/13/2021   ALKPHOS 71 05/13/2021   AST 22 05/13/2021   ALT 15 05/13/2021   PROT 6.3 (L) 05/13/2021   ALBUMIN 3.9 05/13/2021   CALCIUM 8.8 (L) 05/13/2021   GFRAA 79 05/20/2020   QFTBGOLD Negative 06/16/2017   QFTBGOLDPLUS Negative 07/23/2020    Speciality Comments: REMICADE 3 mg/kg x 6 weeks-ACY 06/17/20   Procedures:  No procedures performed Allergies: Codeine, Amoxicillin-pot clavulanate, Erythromycin base, Other, Sulfa antibiotics, Tramadol, Tramadol hcl, and Erythromycin   Assessment / Plan:     Visit Diagnoses: Uveitis-patient denies any flares of uveitis.  Sarcoidosis - No evidence of pulmonary involvement-Evaluated by Dr. Ethelda Gearing on 02/24/2017.   High risk medications (not anticoagulants) long-term use - Remicade IV infusion 3 mg/kg every 6 weeks, methotrexate 2.5 mg 8 tablets every 7 days, and folic acid 1 mg 2 tablets daily.  Her labs are stable.  She gets labs with  her infusions.  She has been advised to stop Remicade and methotrexate in case she develops an infection restarts once infection resolves.  She is also advised to have annual skin examination to screen for nonmelanoma skin cancer.  She does not want to get COVID-19 vaccination.  Instructions regarding other vaccines were placed in the AVS.  Pulmonary nodules - Chest x-ray on 02/24/2017 impression: No edema or consolidation.  Interstitium appears unremarkable.  No evident adenopathy.  S/P left rotator cuff repair  History of repair of right rotator cuff  Primary osteoarthritis of both hands-she is been having increased pain and stiffness in her hands.  She plans to schedule an appointment with a hand surgeon at [facility].  Primary osteoarthritis of both knees-she had good range of motion of bilateral knee joints.  She had good response to [place].  Lower extremity muscle strengthening exercises were discussed.  DDD (degenerative disc disease), lumbar - She had MRI recently by Dr. Shon Baton.  According to the patient MRI results are pending.  She has noticed benefit from gabapentin .  Core strengthening exercises were discussed.  Other medical problems are listed as follows:  SVT (supraventricular tachycardia) (HCC)  Essential hypertension  Gastroesophageal reflux disease without esophagitis  Osteoporosis screening - She had a DEXA scan on November 01, 2020 with a T score of -1.0.  DEXA findings were discussed.  Use of calcium with vitamin D and resistive exercises were discussed.  Orders: No orders of the defined types were placed in this encounter.  No orders of the defined types were placed in this encounter.    Follow-Up Instructions: Return in about 5 months (around 10/22/2021) for Sarcoidosis, Osteoarthritis.   Pollyann Savoy, MD  Note - This record has been created using Animal nutritionist.  Chart creation errors have been sought, but may not always  have been located. Such  creation errors do not reflect on  the standard of medical care.

## 2021-05-13 ENCOUNTER — Other Ambulatory Visit: Payer: Self-pay

## 2021-05-13 ENCOUNTER — Ambulatory Visit (HOSPITAL_COMMUNITY)
Admission: RE | Admit: 2021-05-13 | Discharge: 2021-05-13 | Disposition: A | Discharge status: Home / Self Care | Payer: Medicare Other | Source: Ambulatory Visit | Attending: Rheumatology | Admitting: Rheumatology

## 2021-05-13 DIAGNOSIS — D869 Sarcoidosis, unspecified: Secondary | ICD-10-CM | POA: Insufficient documentation

## 2021-05-13 DIAGNOSIS — H209 Unspecified iridocyclitis: Secondary | ICD-10-CM | POA: Diagnosis not present

## 2021-05-13 DIAGNOSIS — Z79899 Other long term (current) drug therapy: Secondary | ICD-10-CM | POA: Diagnosis not present

## 2021-05-13 LAB — COMPREHENSIVE METABOLIC PANEL
ALT: 15 U/L (ref 0–44)
AST: 22 U/L (ref 15–41)
Albumin: 3.9 g/dL (ref 3.5–5.0)
Alkaline Phosphatase: 71 U/L (ref 38–126)
Anion gap: 5 (ref 5–15)
BUN: 14 mg/dL (ref 8–23)
CO2: 24 mmol/L (ref 22–32)
Calcium: 8.8 mg/dL — ABNORMAL LOW (ref 8.9–10.3)
Chloride: 109 mmol/L (ref 98–111)
Creatinine, Ser: 0.85 mg/dL (ref 0.44–1.00)
GFR, Estimated: >60 mL/min (ref >60)
Glucose, Bld: 112 mg/dL — ABNORMAL HIGH (ref 70–99)
Potassium: 4 mmol/L (ref 3.5–5.1)
Sodium: 138 mmol/L (ref 135–145)
Total Bilirubin: 0.6 mg/dL (ref 0.3–1.2)
Total Protein: 6.3 g/dL — ABNORMAL LOW (ref 6.5–8.1)

## 2021-05-13 LAB — CBC WITH DIFFERENTIAL/PLATELET
Abs Immature Granulocytes: 0.01 10*3/uL (ref 0.00–0.07)
Basophils Absolute: 0 10*3/uL (ref 0.0–0.1)
Basophils Relative: 0 %
Eosinophils Absolute: 0.1 10*3/uL (ref 0.0–0.5)
Eosinophils Relative: 1 %
HCT: 39.8 % (ref 36.0–46.0)
Hemoglobin: 13.4 g/dL (ref 12.0–15.0)
Immature Granulocytes: 0 %
Lymphocytes Relative: 44 %
Lymphs Abs: 2.7 10*3/uL (ref 0.7–4.0)
MCH: 31.1 pg (ref 26.0–34.0)
MCHC: 33.7 g/dL (ref 30.0–36.0)
MCV: 92.3 fL (ref 80.0–100.0)
Monocytes Absolute: 0.4 10*3/uL (ref 0.1–1.0)
Monocytes Relative: 6 %
Neutro Abs: 3.1 10*3/uL (ref 1.7–7.7)
Neutrophils Relative %: 49 %
Platelets: 291 10*3/uL (ref 150–400)
RBC: 4.31 MIL/uL (ref 3.87–5.11)
RDW: 13 % (ref 11.5–15.5)
WBC: 6.3 10*3/uL (ref 4.0–10.5)
nRBC: 0 % (ref 0.0–0.2)

## 2021-05-13 MED ORDER — SODIUM CHLORIDE 0.9 % IV SOLN
200.0000 mg | INTRAVENOUS | Status: DC
  Administered 2021-05-13: 200 mg via INTRAVENOUS
  Filled 2021-05-13: qty 20

## 2021-05-13 MED ORDER — ACETAMINOPHEN 325 MG PO TABS
650.0000 mg | ORAL_TABLET | ORAL | Status: DC
Start: 2021-05-31 — End: 2021-05-14

## 2021-05-13 MED ORDER — DIPHENHYDRAMINE HCL 25 MG PO CAPS: 25.0000 mg | ORAL_CAPSULE | ORAL | Status: DC

## 2021-05-13 NOTE — Progress Notes (Signed)
CBC WNL

## 2021-05-13 NOTE — Progress Notes (Signed)
Glucose is 112.  Calcium is borderline low.  Please clarify if the patient is taking a calcium or vitamin D supplement.  I would recommend increasing dietary calcium intake.

## 2021-05-19 DIAGNOSIS — M533 Sacrococcygeal disorders, not elsewhere classified: Secondary | ICD-10-CM | POA: Diagnosis not present

## 2021-05-22 ENCOUNTER — Ambulatory Visit (INDEPENDENT_AMBULATORY_CARE_PROVIDER_SITE_OTHER): Payer: Medicare Other | Admitting: Rheumatology

## 2021-05-22 ENCOUNTER — Other Ambulatory Visit: Payer: Self-pay

## 2021-05-22 ENCOUNTER — Encounter: Payer: Self-pay | Admitting: Rheumatology

## 2021-05-22 VITALS — BP 114/65 | HR 59 | Ht 63.5 in | Wt 129.0 lb

## 2021-05-22 DIAGNOSIS — H209 Unspecified iridocyclitis: Secondary | ICD-10-CM | POA: Diagnosis not present

## 2021-05-22 DIAGNOSIS — Z9889 Other specified postprocedural states: Secondary | ICD-10-CM

## 2021-05-22 DIAGNOSIS — R918 Other nonspecific abnormal finding of lung field: Secondary | ICD-10-CM | POA: Diagnosis not present

## 2021-05-22 DIAGNOSIS — Z79899 Other long term (current) drug therapy: Secondary | ICD-10-CM

## 2021-05-22 DIAGNOSIS — Z9225 Personal history of immunosupression therapy: Secondary | ICD-10-CM

## 2021-05-22 DIAGNOSIS — M170 Bilateral primary osteoarthritis of knee: Secondary | ICD-10-CM

## 2021-05-22 DIAGNOSIS — I471 Supraventricular tachycardia, unspecified: Secondary | ICD-10-CM

## 2021-05-22 DIAGNOSIS — K219 Gastro-esophageal reflux disease without esophagitis: Secondary | ICD-10-CM | POA: Diagnosis not present

## 2021-05-22 DIAGNOSIS — D869 Sarcoidosis, unspecified: Secondary | ICD-10-CM | POA: Diagnosis not present

## 2021-05-22 DIAGNOSIS — M65311 Trigger thumb, right thumb: Secondary | ICD-10-CM

## 2021-05-22 DIAGNOSIS — Z111 Encounter for screening for respiratory tuberculosis: Secondary | ICD-10-CM

## 2021-05-22 DIAGNOSIS — I10 Essential (primary) hypertension: Secondary | ICD-10-CM | POA: Diagnosis not present

## 2021-05-22 DIAGNOSIS — M5136 Other intervertebral disc degeneration, lumbar region: Secondary | ICD-10-CM

## 2021-05-22 DIAGNOSIS — M51369 Other intervertebral disc degeneration, lumbar region without mention of lumbar back pain or lower extremity pain: Secondary | ICD-10-CM

## 2021-05-22 DIAGNOSIS — M19041 Primary osteoarthritis, right hand: Secondary | ICD-10-CM | POA: Diagnosis not present

## 2021-05-22 DIAGNOSIS — Z13820 Encounter for screening for osteoporosis: Secondary | ICD-10-CM

## 2021-05-22 DIAGNOSIS — M19042 Primary osteoarthritis, left hand: Secondary | ICD-10-CM

## 2021-05-22 NOTE — Patient Instructions (Signed)
Vaccines You are taking a medication(s) that can suppress your immune system.  The following immunizations are recommended: Flu annually Covid-19  Td/Tdap (tetanus, diphtheria, pertussis) every 10 years Pneumonia (Prevnar 15 then Pneumovax 23 at least 1 year apart.  Alternatively, can take Prevnar 20 without needing additional dose) Shingrix (after age 70): 2 doses from 4 weeks to 6 months apart  Please check with your PCP to make sure you are up to date.   If you test POSITIVE for COVID19 and have MILD to MODERATE symptoms: First, call your PCP if you would like to receive COVID19 treatment AND Hold your medications during the infection and for at least 1 week after your symptoms have resolved: Injectable medication (Benlysta, Cimzia, Cosentyx, Enbrel, Humira, Orencia, Remicade, Simponi, Stelara, Taltz, Tremfya) Methotrexate Leflunomide (Arava) Azathioprine Mycophenolate (Cellcept) Osborne Oman, or Rinvoq Otezla If you take Actemra or Kevzara, you DO NOT need to hold these for COVID19 infection.  If you test POSITIVE for COVID19 and have NO symptoms: First, call your PCP if you would like to receive COVID19 treatment AND Hold your medications for at least 10 days after the day that you tested positive Injectable medication (Benlysta, Cimzia, Cosentyx, Enbrel, Humira, Orencia, Remicade, Simponi, Stelara, Taltz, Tremfya) Methotrexate Leflunomide (Arava) Azathioprine Mycophenolate (Cellcept) Osborne [place], or Rinvoq Otezla If you take Actemra or Kevzara, you DO NOT need to hold these for COVID19 infection.  If you have signs or symptoms of an infection or start antibiotics: First, call your PCP for workup of your infection. Hold your medication through the infection, until you complete your antibiotics, and until symptoms resolve if you take the following: Injectable medication (Actemra, Benlysta, Cimzia, Cosentyx, Enbrel, Humira, Kevzara, Orencia, Remicade, Simponi,  Stelara, Taltz, Tremfya) Methotrexate Leflunomide (Arava) Mycophenolate (Cellcept) Harriette Ohara, Olumiant, or Rinvoq  Heart Disease Prevention   Your inflammatory disease increases your risk of heart disease which includes heart attack, stroke, atrial fibrillation (irregular heartbeats), high blood pressure, heart failure and atherosclerosis (plaque in the arteries).  It is important to reduce your risk by:   Keep blood pressure, cholesterol, and blood sugar at healthy levels   Smoking Cessation   Maintain a healthy weight  BMI 20-25   Eat a healthy diet  Plenty of fresh fruit, vegetables, and whole grains  Limit saturated fats, foods high in sodium, and added sugars  DASH and Mediterranean diet   Increase physical activity  Recommend moderate physically activity for 150 minutes per week/ 30 minutes a day for five days a week These can be broken up into three separate ten-minute sessions during the day.   Reduce Stress  Meditation, slow breathing exercises, yoga, coloring books  Dental visits twice a year    It is recommended that you see a dermatologist once a year to screen for nonmelanoma skin cancer while you are on Remicade.

## 2021-05-26 DIAGNOSIS — M533 Sacrococcygeal disorders, not elsewhere classified: Secondary | ICD-10-CM | POA: Diagnosis not present

## 2021-06-16 DIAGNOSIS — R262 Difficulty in walking, not elsewhere classified: Secondary | ICD-10-CM | POA: Diagnosis not present

## 2021-06-16 DIAGNOSIS — M5442 Lumbago with sciatica, left side: Secondary | ICD-10-CM | POA: Diagnosis not present

## 2021-06-18 DIAGNOSIS — R262 Difficulty in walking, not elsewhere classified: Secondary | ICD-10-CM | POA: Diagnosis not present

## 2021-06-18 DIAGNOSIS — M5442 Lumbago with sciatica, left side: Secondary | ICD-10-CM | POA: Diagnosis not present

## 2021-06-20 ENCOUNTER — Other Ambulatory Visit: Payer: Self-pay | Admitting: Pharmacist

## 2021-06-20 DIAGNOSIS — H209 Unspecified iridocyclitis: Secondary | ICD-10-CM

## 2021-06-20 DIAGNOSIS — Z79899 Other long term (current) drug therapy: Secondary | ICD-10-CM

## 2021-06-20 DIAGNOSIS — Z111 Encounter for screening for respiratory tuberculosis: Secondary | ICD-10-CM

## 2021-06-20 DIAGNOSIS — D869 Sarcoidosis, unspecified: Secondary | ICD-10-CM

## 2021-06-20 NOTE — Progress Notes (Addendum)
Next infusion scheduled for Remicade on 06/24/21 and due for updated orders. Diagnosis: uveitis and sarcoidosis  Dose: 3 mg/kg every 6 weeks (200mg  based on last recorded weight of 58.5kg)  Last Clinic Visit: 05/22/21 Next Clinic Visit: 10/23/21  Last infusion: 05/13/21  Labs: CBC and CMP on 05/13/21: glucose is 112, Calcium is borderline low and patient advised to increase dietary calcium intake. CBC wnl TB Gold: negative on 07/23/20   Orders placed for Remicade x 2 doses along with premedication of acetaminophen and diphenhydramine to be administered 30 minutes before medication infusion.  Standing CBC with diff/platelet and CMP with GFR orders placed to be drawn every 2 months.  Next TB gold due 07/23/21 (order placed to be drawn with October infusion)  November, PharmD, MPH, BCPS Clinical Pharmacist (Rheumatology and Pulmonology)

## 2021-06-24 ENCOUNTER — Other Ambulatory Visit: Payer: Self-pay

## 2021-06-24 ENCOUNTER — Encounter (HOSPITAL_COMMUNITY)
Admission: RE | Admit: 2021-06-24 | Discharge: 2021-06-24 | Disposition: A | Discharge status: Home / Self Care | Payer: Medicare Other | Source: Ambulatory Visit | Attending: Rheumatology | Admitting: Rheumatology

## 2021-06-24 DIAGNOSIS — Z111 Encounter for screening for respiratory tuberculosis: Secondary | ICD-10-CM

## 2021-06-24 DIAGNOSIS — Z79899 Other long term (current) drug therapy: Secondary | ICD-10-CM

## 2021-06-24 DIAGNOSIS — H209 Unspecified iridocyclitis: Secondary | ICD-10-CM | POA: Diagnosis not present

## 2021-06-24 DIAGNOSIS — D869 Sarcoidosis, unspecified: Secondary | ICD-10-CM | POA: Diagnosis not present

## 2021-06-24 LAB — COMPREHENSIVE METABOLIC PANEL
ALT: 22 U/L (ref 0–44)
AST: 26 U/L (ref 15–41)
Albumin: 3.7 g/dL (ref 3.5–5.0)
Alkaline Phosphatase: 67 U/L (ref 38–126)
Anion gap: 4 — ABNORMAL LOW (ref 5–15)
BUN: 15 mg/dL (ref 8–23)
CO2: 27 mmol/L (ref 22–32)
Calcium: 8.9 mg/dL (ref 8.9–10.3)
Chloride: 109 mmol/L (ref 98–111)
Creatinine, Ser: 0.83 mg/dL (ref 0.44–1.00)
GFR, Estimated: >60 mL/min (ref >60)
Glucose, Bld: 127 mg/dL — ABNORMAL HIGH (ref 70–99)
Potassium: 3.6 mmol/L (ref 3.5–5.1)
Sodium: 140 mmol/L (ref 135–145)
Total Bilirubin: 0.5 mg/dL (ref 0.3–1.2)
Total Protein: 6.2 g/dL — ABNORMAL LOW (ref 6.5–8.1)

## 2021-06-24 LAB — CBC WITH DIFFERENTIAL/PLATELET
Abs Immature Granulocytes: 0.02 10*3/uL (ref 0.00–0.07)
Basophils Absolute: 0 10*3/uL (ref 0.0–0.1)
Basophils Relative: 0 %
Eosinophils Absolute: 0.1 10*3/uL (ref 0.0–0.5)
Eosinophils Relative: 2 %
HCT: 39.6 % (ref 36.0–46.0)
Hemoglobin: 13.5 g/dL (ref 12.0–15.0)
Immature Granulocytes: 0 %
Lymphocytes Relative: 56 %
Lymphs Abs: 3.6 10*3/uL (ref 0.7–4.0)
MCH: 31.4 pg (ref 26.0–34.0)
MCHC: 34.1 g/dL (ref 30.0–36.0)
MCV: 92.1 fL (ref 80.0–100.0)
Monocytes Absolute: 0.3 10*3/uL (ref 0.1–1.0)
Monocytes Relative: 5 %
Neutro Abs: 2.3 10*3/uL (ref 1.7–7.7)
Neutrophils Relative %: 37 %
Platelets: 261 10*3/uL (ref 150–400)
RBC: 4.3 MIL/uL (ref 3.87–5.11)
RDW: 12.7 % (ref 11.5–15.5)
WBC: 6.4 10*3/uL (ref 4.0–10.5)
nRBC: 0 % (ref 0.0–0.2)

## 2021-06-24 MED ORDER — ACETAMINOPHEN 325 MG PO TABS: 650.0000 mg | ORAL_TABLET | ORAL | Status: DC

## 2021-06-24 MED ORDER — SODIUM CHLORIDE 0.9 % IV SOLN
3.0000 mg/kg | INTRAVENOUS | Status: DC
  Administered 2021-06-24: 200 mg via INTRAVENOUS
  Filled 2021-06-24: qty 20

## 2021-06-24 MED ORDER — DIPHENHYDRAMINE HCL 25 MG PO CAPS: 25.0000 mg | ORAL_CAPSULE | ORAL | Status: DC

## 2021-06-24 NOTE — Progress Notes (Signed)
Glucose is 127. Total protein is borderline low.  Albumin WNL.  Rest of CMP WNL.  We will continue to monitor.

## 2021-06-24 NOTE — Progress Notes (Signed)
CBC with diff WNL

## 2021-06-26 LAB — QUANTIFERON-TB GOLD PLUS (RQFGPL)
QuantiFERON Mitogen Value: >10 IU/mL
QuantiFERON Nil Value: 0.06 IU/mL
QuantiFERON TB1 Ag Value: 0.06 IU/mL
QuantiFERON TB2 Ag Value: 0.06 IU/mL

## 2021-06-26 LAB — QUANTIFERON-TB GOLD PLUS: QuantiFERON-TB Gold Plus: NEGATIVE

## 2021-06-26 NOTE — Progress Notes (Signed)
TB gold negative

## 2021-07-08 DIAGNOSIS — G5601 Carpal tunnel syndrome, right upper limb: Secondary | ICD-10-CM | POA: Diagnosis not present

## 2021-07-08 DIAGNOSIS — M13841 Other specified arthritis, right hand: Secondary | ICD-10-CM | POA: Diagnosis not present

## 2021-07-17 ENCOUNTER — Other Ambulatory Visit: Payer: Self-pay | Admitting: Cardiovascular Disease

## 2021-07-17 ENCOUNTER — Other Ambulatory Visit: Payer: Self-pay | Admitting: Primary Care

## 2021-08-04 ENCOUNTER — Other Ambulatory Visit: Payer: Self-pay

## 2021-08-04 ENCOUNTER — Ambulatory Visit (HOSPITAL_COMMUNITY)
Admission: RE | Admit: 2021-08-04 | Discharge: 2021-08-04 | Disposition: A | Discharge status: Home / Self Care | Payer: Medicare Other | Source: Ambulatory Visit | Attending: Physician Assistant | Admitting: Physician Assistant

## 2021-08-04 DIAGNOSIS — D869 Sarcoidosis, unspecified: Secondary | ICD-10-CM | POA: Diagnosis not present

## 2021-08-04 DIAGNOSIS — H209 Unspecified iridocyclitis: Secondary | ICD-10-CM | POA: Diagnosis not present

## 2021-08-04 MED ORDER — SODIUM CHLORIDE 0.9 % IV SOLN
3.0000 mg/kg | INTRAVENOUS | Status: DC
  Administered 2021-08-04: 200 mg via INTRAVENOUS
  Filled 2021-08-04: qty 20

## 2021-08-04 MED ORDER — DIPHENHYDRAMINE HCL 25 MG PO CAPS: 25.0000 mg | ORAL_CAPSULE | ORAL | Status: DC

## 2021-08-04 MED ORDER — ACETAMINOPHEN 325 MG PO TABS: 650.0000 mg | ORAL_TABLET | ORAL | Status: DC

## 2021-08-05 ENCOUNTER — Ambulatory Visit (HOSPITAL_COMMUNITY): Payer: Medicare Other

## 2021-08-09 ENCOUNTER — Other Ambulatory Visit: Payer: Self-pay | Admitting: Rheumatology

## 2021-08-11 NOTE — Telephone Encounter (Signed)
Next Visit: 10/23/2021  Last Visit: 05/22/2021  Last Fill: 04/17/2021  DX: Uveitis  Current Dose per office note 05/22/2021: methotrexate 2.5 mg 8 tablets every 7 days  Labs: 06/24/2021 Glucose is 127. Total protein is borderline low.  Albumin WNL.  Rest of CMP WNL.   Okay to refill MTX?

## 2021-08-15 ENCOUNTER — Other Ambulatory Visit: Payer: Self-pay | Admitting: Pharmacist

## 2021-08-15 DIAGNOSIS — H209 Unspecified iridocyclitis: Secondary | ICD-10-CM

## 2021-08-15 DIAGNOSIS — D869 Sarcoidosis, unspecified: Secondary | ICD-10-CM

## 2021-08-15 DIAGNOSIS — Z79899 Other long term (current) drug therapy: Secondary | ICD-10-CM

## 2021-08-15 NOTE — Progress Notes (Signed)
Next infusion scheduled for Remicade on 09/15/21 and due for updated orders. Diagnosis:  Dose: 3 mg/kg every 6 weeks (dose of 200mg  based on last recorded weight of 59.4 kg)  Last Clinic Visit: 05/22/21 Next Clinic Visit: 10/23/21  Last infusion: 08/04/21  Labs: CBC and CMP on 06/24/21 wnl TB Gold: negative on 06/24/21   Orders placed for Remicade x 2 doses along with premedication of acetaminophen and diphenhydramine to be administered 30 minutes before medication infusion.  Standing CBC with diff/platelet and CMP with GFR orders placed to be drawn every 2 months.  Next TB gold due 06/24/2022  08/24/2022, PharmD, MPH, BCPS Clinical Pharmacist (Rheumatology and Pulmonology)

## 2021-08-20 ENCOUNTER — Ambulatory Visit
Admission: EM | Admit: 2021-08-20 | Discharge: 2021-08-20 | Disposition: A | Discharge status: Home / Self Care | Payer: Medicare Other | Attending: Emergency Medicine | Admitting: Emergency Medicine

## 2021-08-20 ENCOUNTER — Other Ambulatory Visit: Payer: Self-pay

## 2021-08-20 DIAGNOSIS — J22 Unspecified acute lower respiratory infection: Secondary | ICD-10-CM | POA: Diagnosis not present

## 2021-08-20 MED ORDER — ALBUTEROL SULFATE HFA 108 (90 BASE) MCG/ACT IN AERS: 1.0000 | INHALATION_SPRAY | Freq: Four times a day (QID) | RESPIRATORY_TRACT | 0 refills | Status: DC | PRN

## 2021-08-20 MED ORDER — DOXYCYCLINE HYCLATE 100 MG PO CAPS: 100.0000 mg | ORAL_CAPSULE | Freq: Two times a day (BID) | ORAL | 0 refills | Status: DC

## 2021-08-20 NOTE — ED Triage Notes (Signed)
Patient presents to Urgent Care with complaints of nasal congestion since last Thurs. and bilateral ear pressure since sat. Taking TheraFlu and mucinex  Denies fever.

## 2021-08-20 NOTE — Discharge Instructions (Addendum)
Take doxycycline and use ProAir as prescribed  Follow-up with your PCP in the next 5 to 7 days if symptoms do not improve as expected.  Rest, push lots of fluids (especially water), and utilize supportive care for symptoms. You may take take acetaminophen (Tylenol) every 4-6 hours or ibuprofen every 6-8 hours for muscle pain, joint pain, headaches. Mucinex (guaifenesin) may be taken over the counter for cough as needed and can loosen phlegm. Please read the instructions and take as directed. Saline nasal sprays to rinse congestion can help as well. Warm tea with lemon and honey can sooth sore throat and cough, as can cough drops.  Return to clinic for new-onset fever, difficulty breathing, chest pain, symptoms lasting >3 to 4 weeks, or bloody sputum.

## 2021-08-20 NOTE — ED Provider Notes (Signed)
CHIEF COMPLAINT:   Chief Complaint  Patient presents with   Nasal Congestion   Ear Fullness     SUBJECTIVE/HPI:  HPI A very pleasant 70 y.o.Female presents today with nasal congestion onset last Thursday and bilateral ear pressure onset Saturday.  Patient reports thick green mucus production and some mild shortness of breath at times.  Patient reports that she has an albuterol inhaler at home, but states that it is expired.  She reports getting injections of Remicade for sarcoidosis.  Has been taking theraflu and mucinex. Patient does not report any shortness of breath, chest pain, palpitations, visual changes, weakness, tingling, headache, nausea, vomiting, diarrhea, fever, chills.   has a past medical history of Allergy, Asthma, Bronchial spasm (09/14/2016), DDD (degenerative disc disease), lumbar (09/14/2016), Detached retina (09/14/2016), Diastolic dysfunction (08/28/2015), Essential hypertension (08/28/2015), GERD (gastroesophageal reflux disease), Glaucoma (09/14/2016), Neuromuscular disorder (HCC), Osteoarthritis of both hands (09/14/2016), Osteoarthritis of both knees (09/14/2016), Pure hypercholesterolemia (02/20/2021), Sarcoidosis, SVT (supraventricular tachycardia) (HCC) (08/28/2015), Tachycardia, and Uveitis.  ROS:  Review of Systems See Subjective/HPI Medications, Allergies and Problem List personally reviewed in Epic today OBJECTIVE:   Vitals:   08/20/21 1124  BP: (S) (!) 154/95  Pulse: 78  Resp: 16  Temp: 97.9 F (36.6 C)  SpO2: 98%    Physical Exam   General: Appears well-developed and well-nourished. No acute distress.  HEENT Head: Normocephalic and atraumatic.   Ears: Hearing grossly intact, no drainage or visible deformity.  Nose: No nasal deviation.   Mouth/Throat: No stridor or tracheal deviation.  Non erythematous posterior pharynx noted with clear drainage present.  No white patchy exudate noted. Eyes: Conjunctivae and EOM are normal. No eye drainage or scleral  icterus bilaterally.  Neck: Normal range of motion, neck is supple.  Cardiovascular: Normal rate. Regular rhythm; no murmurs, gallops, or rubs.  Pulm/Chest: No respiratory distress.  Bilateral upper lobes CTA.  Bilateral lower lobes exhibit mild rhonchi with inspiratory wheezing. Neurological: Alert and oriented to person, place, and time.  Skin: Skin is warm and dry.  No rashes, lesions, abrasions or bruising noted to skin.   Psychiatric: Normal mood, affect, behavior, and thought content.   Vital signs and nursing note reviewed.   Patient stable and cooperative with examination. PROCEDURES:    LABS/X-RAYS/EKG/MEDS:   No results found for any visits on 08/20/21.  MEDICAL DECISION MAKING:   Patient presents with nasal congestion onset last Thursday and bilateral ear pressure onset Saturday.  Patient reports thick green mucus production and some mild shortness of breath at times.  Patient reports that she has an albuterol inhaler at home, but states that it is expired.  She reports getting injections of Remicade for sarcoidosis.  Has been taking theraflu and mucinex. Patient does not report any shortness of breath, chest pain, palpitations, visual changes, weakness, tingling, headache, nausea, vomiting, diarrhea, fever, chills.  Given symptoms along with assessment findings, likely lower respiratory tract infection.  Rx doxycycline and a ProAir inhaler to the patient's preferred pharmacy and advised about home treatment and care to include rest, fluids, Mucinex, Tylenol versus ibuprofen.  Return to clinic for new onset fever, difficulty breathing, chest pain, symptoms lasting longer than 3 to 4 weeks or bloody sputum.  Follow-up with PCP if symptoms do not improve as expected over the next 5 to 7 days.  Patient verbalized understanding and agreed with treatment plan.  Patient stable upon discharge. ASSESSMENT/PLAN:  1. Lower respiratory tract infection - doxycycline (VIBRAMYCIN) 100 MG  capsule; Take 1 capsule (  100 mg total) by mouth 2 (two) times daily.  Dispense: 20 capsule; Refill: 0 - albuterol (VENTOLIN HFA) 108 (90 Base) MCG/ACT inhaler; Inhale 1-2 puffs into the lungs every 6 (six) hours as needed (cough).  Dispense: 6.7 g; Refill: 0 Instructions about new medications and side effects provided.  Plan:   Discharge Instructions      Take doxycycline and use ProAir as prescribed  Follow-up with your PCP in the next 5 to 7 days if symptoms do not improve as expected.  Rest, push lots of fluids (especially water), and utilize supportive care for symptoms. You may take take acetaminophen (Tylenol) every 4-6 hours or ibuprofen every 6-8 hours for muscle pain, joint pain, headaches. Mucinex (guaifenesin) may be taken over the counter for cough as needed and can loosen phlegm. Please read the instructions and take as directed. Saline nasal sprays to rinse congestion can help as well. Warm tea with lemon and honey can sooth sore throat and cough, as can cough drops.  Return to clinic for new-onset fever, difficulty breathing, chest pain, symptoms lasting >3 to 4 weeks, or bloody sputum.          Renee Greenhouse, FNP 08/20/21 1153

## 2021-08-25 DIAGNOSIS — Z1231 Encounter for screening mammogram for malignant neoplasm of breast: Secondary | ICD-10-CM | POA: Diagnosis not present

## 2021-08-25 LAB — HM MAMMOGRAPHY

## 2021-08-28 ENCOUNTER — Encounter: Payer: Self-pay | Admitting: Primary Care

## 2021-09-15 ENCOUNTER — Ambulatory Visit (HOSPITAL_COMMUNITY)
Admission: RE | Admit: 2021-09-15 | Discharge: 2021-09-15 | Disposition: A | Discharge status: Home / Self Care | Payer: Medicare Other | Source: Ambulatory Visit | Attending: Rheumatology | Admitting: Rheumatology

## 2021-09-15 ENCOUNTER — Other Ambulatory Visit: Payer: Self-pay

## 2021-09-15 NOTE — Progress Notes (Signed)
Client c/o continued cough and was seen 08/20/21 and given 10days of antibiotic; client still has congested sounding cough; Andrea,RN for Dr Corliss Skains notified and per Sue Lush client needs to get clearance from urgent care or primary doctor to receive Remicade

## 2021-09-16 ENCOUNTER — Telehealth: Payer: Self-pay | Admitting: Pharmacist

## 2021-09-16 ENCOUNTER — Ambulatory Visit
Admission: RE | Admit: 2021-09-16 | Discharge: 2021-09-16 | Disposition: A | Discharge status: Home / Self Care | Payer: Medicare Other | Source: Ambulatory Visit | Attending: Emergency Medicine | Admitting: Emergency Medicine

## 2021-09-16 ENCOUNTER — Ambulatory Visit (INDEPENDENT_AMBULATORY_CARE_PROVIDER_SITE_OTHER): Payer: Medicare Other

## 2021-09-16 VITALS — BP 161/81 | HR 72 | Temp 99.0°F | Resp 18

## 2021-09-16 DIAGNOSIS — R059 Cough, unspecified: Secondary | ICD-10-CM

## 2021-09-16 DIAGNOSIS — D869 Sarcoidosis, unspecified: Secondary | ICD-10-CM | POA: Diagnosis not present

## 2021-09-16 NOTE — Discharge Instructions (Addendum)
Your chest xray is normal.  Follow up with your primary care provider if your symptoms are not improving.    

## 2021-09-16 NOTE — ED Provider Notes (Signed)
Roderic Palau    CSN: [id number] Arrival date & time: 09/16/21  1745      History   Chief Complaint Chief Complaint  Patient presents with   Cough    HPI Renee Jones is a 70 y.o. female.  Patient presents with postnasal drip and cough for several weeks.  No fever, rash, unusual shortness of breath, vomiting, diarrhea, or other symptoms.  Patient was seen at this urgent care on 08/20/2021; diagnosed with lower respiratory tract infection; treated with doxycycline and albuterol inhaler.  She states her symptoms greatly improved after this treatment and she just has this ongoing nagging cough with post nasal drip.  She requests a chest xray.  Her medical history includes sarcoidosis, pulmonary nodules, hypertension, diastolic dysfunction, prediabetes, arthritis.  The history is provided by the patient and medical records.   Past Medical History:  Diagnosis Date   Allergy    Asthma    Bronchial spasm 09/14/2016   DDD (degenerative disc disease), lumbar 09/14/2016   Detached retina 0000000   Diastolic dysfunction [id number]   Essential hypertension 08/28/2015   GERD (gastroesophageal reflux disease)    Glaucoma 09/14/2016   Neuromuscular disorder ([place])    Osteoarthritis of both hands 09/14/2016   Osteoarthritis of both knees 09/14/2016   Pure hypercholesterolemia 02/20/2021   Sarcoidosis    with +lymph node biopsy   SVT (supraventricular tachycardia) ([place]) 08/28/2015   Tachycardia    Uveitis     Patient Active Problem List   Diagnosis Date Noted   Hyperlipidemia 02/20/2021   Prediabetes 12/21/2018   High risk medications (not anticoagulants) long-term use 01/13/2017   GERD (gastroesophageal reflux disease) 09/14/2016   Bronchial spasm 09/14/2016   Glaucoma 09/14/2016   Detached retina 09/14/2016   Osteoarthritis of both hands 09/14/2016   Osteoarthritis of both knees 09/14/2016   DDD (degenerative disc disease), lumbar 09/14/2016   Uveitis 06/22/2016    SVT (supraventricular tachycardia) ([place]) 08/28/2015   Essential hypertension 99991111   Diastolic dysfunction 99991111   Sarcoidosis 09/30/2012   Pulmonary nodules 09/30/2012   Tachycardia 09/30/2012   Sarcoid arthropathy 09/30/2012    Past Surgical History:  Procedure Laterality Date   CATARACT EXTRACTION, BILATERAL  2018   CYSTECTOMY     EYE SURGERY Bilateral 2018   cataract removal    LUMBAR SPINE SURGERY  2009   ROTATOR CUFF REPAIR Right 2011   ROTATOR CUFF REPAIR Left 06/2018   TONSILLECTOMY AND ADENOIDECTOMY  1962   TOTAL ABDOMINAL HYSTERECTOMY  1984    OB History   No obstetric history on file.      Home Medications    Prior to Admission medications   Medication Sig Start Date End Date Taking? Authorizing Provider  methotrexate 2.5 MG tablet TAKE 8 TABLET BY MOUTH ONCE A WEEK 08/11/21   Ofilia Neas, PA-C  albuterol (VENTOLIN HFA) 108 (90 Base) MCG/ACT inhaler Inhale 1-2 puffs into the lungs every 6 (six) hours as needed (cough). 08/20/21   Boddu, Erasmo Downer, FNP  amLODipine (NORVASC) 5 MG tablet TAKE 1 TABLET(5 MG) BY MOUTH DAILY FOR BLOOD PRESSURE 07/17/21   Pleas Koch, NP  cholecalciferol (VITAMIN D) 1000 units tablet Take 1,000 Units by mouth daily.    [provider]  cyclobenzaprine (FLEXERIL) 10 MG tablet TK 1 T PO Q 8 H PRF SPASM Patient not taking: Reported on 05/22/2021 06/30/19   [provider]  diclofenac sodium (VOLTAREN) 1 % GEL Apply three grams to three large joints up to  three times daily. Patient taking differently: Apply three grams to three large joints up to three times daily as needed 02/03/19   Pollyann Savoy, MD  doxycycline (VIBRAMYCIN) 100 MG capsule Take 1 capsule (100 mg total) by mouth 2 (two) times daily. 08/20/21   Boddu, Belenda Cruise, FNP  fluticasone (FLONASE) 50 MCG/ACT nasal spray Place 2 sprays into both nostrils daily as needed for allergies or rhinitis.    [provider]  folic acid (FOLVITE) 1 MG  tablet Take 2 tablets by mouth once daily 11/27/20   Gearldine Bienenstock, PA-C  gabapentin (NEURONTIN) 300 MG capsule 2 (two) times daily.    [provider]  InFLIXimab (REMICADE IV) Inject into the vein every 6 (six) weeks.    [provider]  metoprolol tartrate (LOPRESSOR) 50 MG tablet TAKE 1 AND 1/2 TABLETS BY MOUTH TWICE DAILY 07/17/21   Chilton Si, MD  naproxen sodium (ALEVE) 220 MG tablet     [provider]  ondansetron (ZOFRAN) 4 MG tablet TAKE 1 TABLET BY MOUTH EVERY 6 HOURS AS NEEDED FOR NAUSEA AND VOMITING 04/17/21   Pollyann Savoy, MD  pantoprazole (PROTONIX) 40 MG tablet TAKE 1 TABLET BY MOUTH ONCE DAILY 08/17/20   Doreene Nest, NP  pravastatin (PRAVACHOL) 40 MG tablet Take 1 tablet (40 mg total) by mouth daily. For cholesterol. 02/03/21   Doreene Nest, NP  SYSTANE ULTRA 0.4-0.3 % SOLN SMARTSIG:1 Drop(s) In Eye(s) 6 Times Daily 07/15/20   [provider]  Rayburn Ma Oil (SYSTANE NIGHTTIME) OINT SMARTSIG:1 Inch(es) In Eye(s) Every Night 07/15/20   [provider]    Family History Family History  Adopted: Yes  Problem Relation Age of Onset   Scleroderma Daughter     Social History Social History   Tobacco Use   Smoking status: Never   Smokeless tobacco: Never  Vaping Use   Vaping Use: Never used  Substance Use Topics   Alcohol use: No    Alcohol/week: 0.0 standard drinks   Drug use: No     Allergies   Codeine, Amoxicillin-pot clavulanate, Erythromycin base, Other, Sulfa antibiotics, Tramadol, Tramadol hcl, and Erythromycin   Review of Systems Review of Systems  Constitutional:  Negative for chills and fever.  HENT:  Positive for postnasal drip. Negative for ear pain and sore throat.   Respiratory:  Positive for cough. Negative for shortness of breath.   Cardiovascular:  Negative for chest pain and palpitations.  Gastrointestinal:  Negative for diarrhea and vomiting.  Skin:  Negative for  color change and rash.  All other systems reviewed and are negative.   Physical Exam Triage Vital Signs ED Triage Vitals  Enc Vitals Group     BP      Pulse      Resp      Temp      Temp src      SpO2      Weight      Height      Head Circumference      Peak Flow      Pain Score      Pain Loc      Pain Edu?      Excl. in GC?    No data found.  Updated Vital Signs BP (!) 161/81 (BP Location: Left Arm)   Pulse 72   Temp 99 F (37.2 C) (Oral)   Resp 18   SpO2 98%   Visual Acuity Right Eye Distance:   Left Eye Distance:  Bilateral Distance:    Right Eye Near:   Left Eye Near:    Bilateral Near:     Physical Exam Vitals and nursing note reviewed.  Constitutional:      General: She is not in acute distress.    Appearance: She is well-developed. She is not ill-appearing.  HENT:     Head: Normocephalic and atraumatic.     Right Ear: Tympanic membrane normal.     Left Ear: Tympanic membrane normal.     Nose: Nose normal.     Mouth/Throat:     Mouth: Mucous membranes are moist.     Pharynx: Oropharynx is clear.  Cardiovascular:     Rate and Rhythm: Normal rate and regular rhythm.     Heart sounds: Normal heart sounds.  Pulmonary:     Effort: Pulmonary effort is normal. No respiratory distress.     Breath sounds: Normal breath sounds. No wheezing or rhonchi.  Musculoskeletal:     Cervical back: Neck supple.  Skin:    General: Skin is warm and dry.  Neurological:     Mental Status: She is alert.  Psychiatric:        Mood and Affect: Mood normal.        Behavior: Behavior normal.     UC Treatments / Results  Labs (all labs ordered are listed, but only abnormal results are displayed) Labs Reviewed - No data to display  EKG   Radiology DG Chest 2 View  Result Date: 09/16/2021 CLINICAL DATA:  Productive cough for 1 week.  Sarcoidosis. EXAM: CHEST - 2 VIEW COMPARISON:  02/24/2017 FINDINGS: The heart size and mediastinal contours are within normal  limits. Both lungs are clear. The visualized skeletal structures are unremarkable. IMPRESSION: No active cardiopulmonary disease. Electronically Signed   By: Marlaine Hind M.D.   On: 09/16/2021 18:54    Procedures Procedures (including critical care time)  Medications Ordered in UC Medications - No data to display  Initial Impression / Assessment and Plan / UC Course  I have reviewed the triage vital signs and the nursing notes.  Pertinent labs & imaging results that were available during my care of the patient were reviewed by me and considered in my medical decision making (see chart for details).   Cough.  Chest x-ray negative.  Discussed symptomatic treatment for cough.  Instructed patient to follow-up with primary care provider if her symptoms are not improving.  She agrees to plan of care.   Final Clinical Impressions(s) / UC Diagnoses   Final diagnoses:  Cough, unspecified type     Discharge Instructions      Your chest xray is normal.  Follow up with your primary care provider if your symptoms are not improving.         ED Prescriptions   None    PDMP not reviewed this encounter.   Sharion Balloon, NP 09/16/21 1901

## 2021-09-16 NOTE — Telephone Encounter (Signed)
Received message from Paul, RN at [facility] while I was [facility]: client was seen in urgent care for lower respiratory infection on 08/20/21 and was on 10 days of antibiotics. Patient still had congested sounding cough.  Per chart review, patient did not receive infusion and was advised to seek care for PCP for f/u. Spoke with Richardo Priest, LPN at our clinic  Chesley Mires, PharmD, MPH, BCPS Clinical Pharmacist (Rheumatology and Pulmonology)

## 2021-09-16 NOTE — ED Triage Notes (Signed)
Pt c/o cough and chest congestion for several weeks. Pt was last seen on 11/2. She needs to be cleared in order to receive her remicade infusion

## 2021-09-18 ENCOUNTER — Encounter: Payer: Self-pay | Admitting: Family Medicine

## 2021-09-18 ENCOUNTER — Telehealth (INDEPENDENT_AMBULATORY_CARE_PROVIDER_SITE_OTHER): Payer: Medicare Other | Admitting: Family Medicine

## 2021-09-18 ENCOUNTER — Other Ambulatory Visit: Payer: Self-pay

## 2021-09-18 VITALS — BP 166/53 | HR 109 | Temp 97.3°F | Ht 63.0 in | Wt 130.3 lb

## 2021-09-18 DIAGNOSIS — J208 Acute bronchitis due to other specified organisms: Secondary | ICD-10-CM

## 2021-09-18 DIAGNOSIS — D849 Immunodeficiency, unspecified: Secondary | ICD-10-CM

## 2021-09-18 DIAGNOSIS — D869 Sarcoidosis, unspecified: Secondary | ICD-10-CM

## 2021-09-18 MED ORDER — LEVOFLOXACIN 500 MG PO TABS: 500.0000 mg | ORAL_TABLET | Freq: Every day | ORAL | 0 refills | Status: DC

## 2021-09-18 MED ORDER — PREDNISONE 20 MG PO TABS: ORAL_TABLET | ORAL | 0 refills | Status: DC

## 2021-09-18 NOTE — Progress Notes (Signed)
Renee Jones T. Lois Slagel, MD Primary Care and Sports Medicine [facility] at [facility] [address] [place], [address] Phone: [phone]  FAX: [phone]  Renee Jones - 70 y.o. female  MRN [id number]  Date of Birth: [birth year]  Visit Date: 09/18/2021  PCP: Renee Nest, NP  Referred by: Renee Nest, NP  Virtual Visit via Video Note:  I connected with  Renee Jones on 09/18/2021 11:20 AM EST by a video enabled telemedicine application and verified that I am speaking with the correct person using two identifiers.   Location patient: home computer, tablet, or smartphone Location provider: work or home office Consent: Verbal consent directly obtained from [employer]. Persons participating in the virtual visit: patient, provider  I discussed the limitations of evaluation and management by telemedicine and the availability of in person appointments. The patient expressed understanding and agreed to proceed.  Chief Complaint  Patient presents with   Cough    Started on around 08/15/21-was prescribed Doxy-Cleared up for a week and then came back.  Went back to [employer] CXR   Nasal Congestion   Sinus Drainage    causing throat irritation    History of Present Illness:  Long course of illness, predominantly pulmonary with also some upper airway congestion with worsening cough.  She is on Remicade as well as methotrexate.  At the end of October, was sick and went to UC, took some doxy.  About a week later, things started about a later.   Now her cough is been progressive for greater than 2 weeks.  Chest is hurting, head is pounding.  Pain in through her breastbone as well, and she feels like she is having a chest spasm.  Sarcoid - now in her eyes.  Not in her lungs.   Up all the night coughing.   Robitussin, mucinex. None of the over-the-counter medications of help.   Review of Systems as above: See  pertinent positives and pertinent negatives per HPI No acute distress verbally   Observations/Objective/Exam:  An attempt was made to discern vital signs over the phone and per patient if applicable and possible.   General:    Alert, Oriented, appears well and in no acute distress  Pulmonary:     On inspection no signs of respiratory distress.  Psych / Neurological:     Pleasant and cooperative.  Assessment and Plan:    ICD-10-CM   1. Acute bronchitis due to other specified organisms  J20.8     2. Immunosuppressed status (HCC)  D84.9     3. Sarcoidosis  D86.9      Bronchitis versus pneumonia in a patient with sarcoidosis, chronically on methotrexate and Remicade.  Long-term immunosuppressed status.  Given length of time, persistent symptoms, immunosuppressed status, I am can place the patient on 7 days of Levaquin, add in prednisone.  Continue with over-the-counter cough medication.  She does have history of some hives with codeine.  I discussed the assessment and treatment plan with the patient. The patient was provided an opportunity to ask questions and all were answered. The patient agreed with the plan and demonstrated an understanding of the instructions.   The patient was advised to call back or seek an in-person evaluation if the symptoms worsen or if the condition fails to improve as anticipated.  Follow-up: prn unless noted otherwise below No follow-ups on file.  Meds ordered this encounter  Medications   levofloxacin (LEVAQUIN) 500 MG  tablet    Sig: Take 1 tablet (500 mg total) by mouth daily.    Dispense:  7 tablet    Refill:  0   predniSONE (DELTASONE) 20 MG tablet    Sig: 2 tabs po daily for 5 days, then 1 tab po daily for 5 days    Dispense:  15 tablet    Refill:  0   No orders of the defined types were placed in this encounter.   Signed,  Renee Deed. Klever Twyford, MD

## 2021-09-24 NOTE — Telephone Encounter (Signed)
Patient called stating she will be completing prednisone and levofloxacin course on Saturday, 09/27/21. I've advised her to hold off on r/s Remicade infusion until Monday, 09/29/21. She will have to be asymptomatic after antibiotic completion to receive Remicade infusion. She verbalized understanding  Chesley Mires, PharmD, MPH, BCPS Clinical Pharmacist (Rheumatology and Pulmonology)

## 2021-10-09 NOTE — Progress Notes (Signed)
Office Visit Note  Patient: Renee Jones             Date of Birth: 11/29/1950           MRN: [id number]             PCP: Pleas Koch, NP Referring: Pleas Koch, NP Visit Date: 10/23/2021 Occupation: @GUAROCC @  Subjective:  Congestion   History of Present Illness: Renee Jones is a 70 y.o. female with history of uveitis, sarcoidosis, and osteoarthritis.  She is prescribed remicade infusions 3 mg/kg every 6 weeks, methotrexate 8 tablets by mouth once weekly, and folic acid 2 mg daily.  Her last Remicade infusion was administered on 08/04/2021.  She started to experience nasal congestion and ear pain at the end of October 2022.  She presented to urgent care on 08/20/2021 and was diagnosed with a lower respiratory tract infection at which time she was prescribed doxycycline and an albuterol inhaler.  She postponed her Remicade infusion on 09/15/2021 since she remains symptomatic.  She was evaluated at urgent care on 09/16/2021 for persistent cough and postnasal drip.  Chest x-ray was negative at that time.  She was scheduled a virtual visit with her PCP on 09/18/2021 at which time she was given a prescription for Levaquin and a prednisone taper.  The patient held methotrexate for 1 week while taking Levaquin otherwise she has remained on methotrexate on a weekly basis.  She continues to have congestion, postnasal drip, cough, and intermittent sneezing.  She has not had any recent fevers.  She has not followed back up with her PCP to discuss the persistence of her symptoms. She denies any signs or symptoms of a uveitis flare since having to hold Remicade.  She is not experiencing any eye pain, redness, or photophobia.  She continues to follow-up at [facility] care on a yearly basis.  She has not had any signs or symptoms of a sarcoidosis flare.  She denies any SOB or pleuritic chest pain.  She is no longer followed by pulmonology.  She denies any recent rashes.  She denies  any increased joint pain or joint swelling.  She noticed significant improvement in her knee joint pain after undergoing visco gel injections in May 2022.     Activities of Daily Living:  Patient reports morning stiffness for 5-10 minutes.   Patient Reports nocturnal pain.  Difficulty dressing/grooming: Denies Difficulty climbing stairs: Denies Difficulty getting out of chair: Denies Difficulty using hands for taps, buttons, cutlery, and/or writing: Reports  Review of Systems  Constitutional:  Positive for fatigue.  HENT:  Positive for mouth dryness and nose dryness. Negative for mouth sores.   Eyes:  Positive for dryness. Negative for pain and itching.  Respiratory:  Negative for shortness of breath and difficulty breathing.   Cardiovascular:  Negative for chest pain and palpitations.  Gastrointestinal:  Negative for blood in stool, constipation and diarrhea.  Endocrine: Negative for increased urination.  Genitourinary:  Negative for difficulty urinating.  Musculoskeletal:  Positive for joint pain, joint pain and morning stiffness. Negative for joint swelling, myalgias, muscle tenderness and myalgias.  Skin:  Negative for color change, rash and redness.  Allergic/Immunologic: Negative for susceptible to infections.  Neurological:  Positive for numbness. Negative for dizziness, headaches, memory loss and weakness.  Hematological:  Negative for bruising/bleeding tendency.  Psychiatric/Behavioral:  Negative for confusion.    PMFS History:  Patient Active Problem List   Diagnosis Date Noted   Hyperlipidemia 02/20/2021  Prediabetes 12/21/2018   High risk medications (not anticoagulants) long-term use 01/13/2017   GERD (gastroesophageal reflux disease) 09/14/2016   Bronchial spasm 09/14/2016   Glaucoma 09/14/2016   Detached retina 09/14/2016   Osteoarthritis of both hands 09/14/2016   Osteoarthritis of both knees 09/14/2016   DDD (degenerative disc disease), lumbar 09/14/2016    Uveitis 06/22/2016   SVT (supraventricular tachycardia) (HCC) 08/28/2015   Essential hypertension 08/28/2015   Diastolic dysfunction 08/28/2015   Sarcoidosis 09/30/2012   Pulmonary nodules 09/30/2012   Tachycardia 09/30/2012   Sarcoid arthropathy 09/30/2012    Past Medical History:  Diagnosis Date   Allergy    Asthma    Bronchial spasm 09/14/2016   DDD (degenerative disc disease), lumbar 09/14/2016   Detached retina 09/14/2016   Diastolic dysfunction 08/28/2015   Essential hypertension 08/28/2015   GERD (gastroesophageal reflux disease)    Glaucoma 09/14/2016   Neuromuscular disorder (HCC)    Osteoarthritis of both hands 09/14/2016   Osteoarthritis of both knees 09/14/2016   Pure hypercholesterolemia 02/20/2021   Sarcoidosis    with +lymph node biopsy   SVT (supraventricular tachycardia) (HCC) 08/28/2015   Tachycardia    Uveitis     Family History  Adopted: Yes  Problem Relation Age of Onset   Scleroderma Daughter    Past Surgical History:  Procedure Laterality Date   CATARACT EXTRACTION, BILATERAL  2018   CYSTECTOMY     EYE SURGERY Bilateral 2018   cataract removal    LUMBAR SPINE SURGERY  2009   ROTATOR CUFF REPAIR Right 2011   ROTATOR CUFF REPAIR Left 06/2018   TONSILLECTOMY AND ADENOIDECTOMY  1962   TOTAL ABDOMINAL HYSTERECTOMY  1984   Social History   Social History Narrative   [employer].   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   Immunization History  Administered Date(s) Administered   DTaP 07/26/2010   Influenza Split 07/19/2014   Influenza,inj,Quad PF,6+ Mos 07/19/2016, 12/21/2018   Influenza-Unspecified 07/09/2019   Pneumococcal Conjugate-13 01/21/2015   Pneumococcal Polysaccharide-23 09/19/2012, 09/21/2012   Pneumococcal-Unspecified 07/26/2012, 01/18/2015   Td 03/31/2010   Tdap 05/07/2010   Varicella 09/19/2012   Zoster, Live 07/26/2012, 09/21/2012     Objective: Vital Signs: BP (!) 158/83 (BP Location: Left Arm, Patient  Position: Sitting, Cuff Size: Normal)    Pulse 61    Ht 5' 3.5" (1.613 m)    Wt 132 lb 3.2 oz (60 kg)    BMI 23.05 kg/m    Physical Exam Vitals and nursing note reviewed.  Constitutional:      Appearance: She is well-developed.  HENT:     Head: Normocephalic and atraumatic.  Eyes:     Conjunctiva/sclera: Conjunctivae normal.  Cardiovascular:     Rate and Rhythm: Normal rate and regular rhythm.     Heart sounds: Normal heart sounds.  Pulmonary:     Effort: Pulmonary effort is normal.     Breath sounds: Normal breath sounds.  Abdominal:     General: Bowel sounds are normal.     Palpations: Abdomen is soft.  Musculoskeletal:     Cervical back: Normal range of motion.  Lymphadenopathy:     Cervical: No cervical adenopathy.  Skin:    General: Skin is warm and dry.     Capillary Refill: Capillary refill takes less than 2 seconds.  Neurological:     Mental Status: She is alert and oriented to person, place, and time.  Psychiatric:        Behavior:  Behavior normal.     Musculoskeletal Exam: C-spine, thoracic spine, and lumbar spine good ROM.  Shoulder joints, elbow joints, wrist joints, MCPs, PIPs, and DIPs good ROM with no synovitis.  Complete fist formation bilaterally.  PIP and DIP thickening consistent with osteoarthritis of both hands.  Hip joints, knee joints, and ankle joints have good ROM with no discomfort.  No warmth or effusion of knee joints.  No tenderness or joint swelling.   CDAI Exam: CDAI Score: -- Patient Global: --; Provider Global: -- Swollen: --; Tender: -- Joint Exam 10/23/2021   No joint exam has been documented for this visit   There is currently no information documented on the homunculus. Go to the Rheumatology activity and complete the homunculus joint exam.  Investigation: No additional findings.  Imaging: No results found.  Recent Labs: Lab Results  Component Value Date   WBC 6.4 06/24/2021   HGB 13.5 06/24/2021   PLT 261 06/24/2021   NA  140 06/24/2021   K 3.6 06/24/2021   CL 109 06/24/2021   CO2 27 06/24/2021   GLUCOSE 127 (H) 06/24/2021   BUN 15 06/24/2021   CREATININE 0.83 06/24/2021   BILITOT 0.5 06/24/2021   ALKPHOS 67 06/24/2021   AST 26 06/24/2021   ALT 22 06/24/2021   PROT 6.2 (L) 06/24/2021   ALBUMIN 3.7 06/24/2021   CALCIUM 8.9 06/24/2021   GFRAA 79 05/20/2020   QFTBGOLD Negative 06/16/2017   QFTBGOLDPLUS Negative 06/24/2021    Speciality Comments: REMICADE 3 mg/kg x 6 weeks-ACY 06/17/20   Procedures:  No procedures performed Allergies: Codeine, Amoxicillin-pot clavulanate, Erythromycin base, Other, Sulfa antibiotics, Tramadol, Tramadol hcl, and Erythromycin    Assessment / Plan:     Visit Diagnoses: Uveitis: She has not had any signs or symptoms of a uveitis flare.  She has not experienced any eye pain, photophobia, or new floaters.  No conjunctival injection was noted on examination today.  She has been seen at [facility] care on a yearly basis.   Her current treatment regimen includes Remicade 3 mg/kg IV infusions every 6 weeks, methotrexate 8 tablets by mouth once weekly, and folic acid 2 mg daily.  Her last Remicade infusion was administered on 08/04/2021.  She has had to hold Remicade due to being diagnosed with a lower respiratory tract infection in October followed by bronchitis at the end of November/early December.  She was initially treated with doxycycline followed by Levaquin.  She held methotrexate for 1 week while taking Levaquin but otherwise has been taking methotrexate on a weekly basis.  Discussed the importance of holding Remicade and methotrexate if she develops signs or symptoms of an infection.  She continues to have ongoing congestion, postnasal drip, and a cough.  She was advised to cancel her Remicade infusion on Monday and to hold Remicade until the infection has completely cleared.  She is planning on going to her PCPs office today for further evaluation.  I discussed that she will  require clearance from her PCP prior to scheduling her next Remicade infusion.  She voiced understanding.  She was advised to notify us if she develops signs or symptoms of a uveitis flare while being off of therapy.  Sarcoidosis - No evidence of pulmonary involvement-Evaluated by Dr. Chase Caller on 02/24/2017.  No recurrence of symptoms.  She has not been experiencing any shortness of breath.  High risk medications (not anticoagulants) long-term use - Remicade IV infusion 3 mg/kg every 6 weeks, methotrexate 2.5 mg 8 tablets every 7  days, and folic acid 1 mg 2 tablets daily.  TB gold negative 06/24/21.  CBC and CMP will be drawn today since she has been taking methotrexate on a weekly basis.  She will continue to require updated lab work every 3 months to monitor for drug toxicity.- Plan: COMPLETE METABOLIC PANEL WITH GFR, CBC with Differential/Platelet Discussed the importance of always holding methotrexate and Remicade if she develops signs or symptoms of an infection and to resume once the infection has completely cleared.  She voiced understanding.  Pulmonary nodules - Chest x-ray on 02/24/2017 impression: No edema or consolidation.  Interstitium appears unremarkable.  No evident adenopathy.  S/P left rotator cuff repair: Doing well.  She has good range of motion of the left shoulder joint with no discomfort at this time.  History of repair of right rotator cuff: Doing well.  She has good range of motion of the right shoulder with no discomfort at this time.  Primary osteoarthritis of both hands: She has PIP and DIP thickening consistent with osteoarthritis of both hands.  She is able to make a complete fist bilaterally.  She experiences intermittent discomfort in her right hand.  According to the patient she had a right wrist injection performed by her orthopedist recently which alleviated the discomfort she was experiencing. Discussed the importance of joint protection and muscle  strengthening.  Primary osteoarthritis of both knees: She has good range of motion of both knee joints on examination today.  No warmth or effusion was noted.  She noticed a significant improvement in her knee joint discomfort after undergoing Visco gel injections in April/May 2022.  She was advised to notify us when she would like to reapply for Visco gel injections in the future.  DDD (degenerative disc disease), lumbar - She had MRI recently by Dr. Rolena Infante.    Nasal congestion: She initially developed nasal congestion as well as ear pain at the end of October 2022.  She was seen at urgent care on 08/20/2021 and was diagnosed with a lower respiratory tract infection.  She was treated with a round of doxycycline but had persistent symptoms.  She was reevaluated in urgent care on 09/16/2021 after having to miss her Remicade infusion 09/15/2021.  She had a virtual visit with her PCP on 09/18/2021 and was given a prescription for Levaquin as well as a prednisone taper.  She held methotrexate for 1 week while taking Levaquin but otherwise remains on methotrexate on a weekly basis.  She continues to have congestion, postnasal drip, and a cough.  She has not had any recent fevers.  She has not followed back up with her PCP despite the persistence of her symptoms. She was advised to cancel her Remicade infusion on Monday and to hold methotrexate until the symptoms have completely resolved.  She is planning to go see her PCP today for further evaluation.  She will require clearance by her PCP prior to scheduling her next Remicade infusion and resuming methotrexate.  Other medical conditions are listed as follows:   SVT (supraventricular tachycardia) (HCC)  Gastroesophageal reflux disease without esophagitis  Essential hypertension  Osteoporosis screening - She had a DEXA scan on November 01, 2020 with a T score of -1.0.  Orders: Orders Placed This Encounter  Procedures   COMPLETE METABOLIC PANEL WITH GFR    CBC with Differential/Platelet   No orders of the defined types were placed in this encounter.    Follow-Up Instructions: Return in about 5 months (around 03/23/2022) for Uveitis,  Sarcoidosis, Osteoarthritis.   Ofilia Neas, PA-C  Note - This record has been created using Dragon software.  Chart creation errors have been sought, but may not always  have been located. Such creation errors do not reflect on  the standard of medical care.

## 2021-10-23 ENCOUNTER — Telehealth: Payer: Self-pay | Admitting: Primary Care

## 2021-10-23 ENCOUNTER — Other Ambulatory Visit: Payer: Self-pay

## 2021-10-23 ENCOUNTER — Ambulatory Visit (INDEPENDENT_AMBULATORY_CARE_PROVIDER_SITE_OTHER): Payer: Medicare Other | Admitting: Physician Assistant

## 2021-10-23 ENCOUNTER — Encounter: Payer: Self-pay | Admitting: Physician Assistant

## 2021-10-23 VITALS — BP 158/83 | HR 61 | Ht 63.5 in | Wt 132.2 lb

## 2021-10-23 DIAGNOSIS — Z79899 Other long term (current) drug therapy: Secondary | ICD-10-CM

## 2021-10-23 DIAGNOSIS — Z9889 Other specified postprocedural states: Secondary | ICD-10-CM | POA: Diagnosis not present

## 2021-10-23 DIAGNOSIS — M5136 Other intervertebral disc degeneration, lumbar region: Secondary | ICD-10-CM | POA: Diagnosis not present

## 2021-10-23 DIAGNOSIS — M170 Bilateral primary osteoarthritis of knee: Secondary | ICD-10-CM | POA: Diagnosis not present

## 2021-10-23 DIAGNOSIS — H209 Unspecified iridocyclitis: Secondary | ICD-10-CM

## 2021-10-23 DIAGNOSIS — D869 Sarcoidosis, unspecified: Secondary | ICD-10-CM

## 2021-10-23 DIAGNOSIS — K219 Gastro-esophageal reflux disease without esophagitis: Secondary | ICD-10-CM | POA: Diagnosis not present

## 2021-10-23 DIAGNOSIS — R0981 Nasal congestion: Secondary | ICD-10-CM

## 2021-10-23 DIAGNOSIS — Z13820 Encounter for screening for osteoporosis: Secondary | ICD-10-CM

## 2021-10-23 DIAGNOSIS — I471 Supraventricular tachycardia: Secondary | ICD-10-CM | POA: Diagnosis not present

## 2021-10-23 DIAGNOSIS — I10 Essential (primary) hypertension: Secondary | ICD-10-CM | POA: Diagnosis not present

## 2021-10-23 DIAGNOSIS — R918 Other nonspecific abnormal finding of lung field: Secondary | ICD-10-CM | POA: Diagnosis not present

## 2021-10-23 DIAGNOSIS — M19041 Primary osteoarthritis, right hand: Secondary | ICD-10-CM | POA: Diagnosis not present

## 2021-10-23 DIAGNOSIS — M19042 Primary osteoarthritis, left hand: Secondary | ICD-10-CM

## 2021-10-23 LAB — CBC WITH DIFFERENTIAL/PLATELET
Absolute Monocytes: 353 cells/uL (ref 200–950)
Basophils Absolute: 43 cells/uL (ref 0–200)
Basophils Relative: 0.7 %
Eosinophils Absolute: 174 cells/uL (ref 15–500)
Eosinophils Relative: 2.8 %
HCT: 42.3 % (ref 35.0–45.0)
Hemoglobin: 14.2 g/dL (ref 11.7–15.5)
Lymphs Abs: 3007 cells/uL (ref 850–3900)
MCH: 30.7 pg (ref 27.0–33.0)
MCHC: 33.6 g/dL (ref 32.0–36.0)
MCV: 91.6 fL (ref 80.0–100.0)
MPV: 10.7 fL (ref 7.5–12.5)
Monocytes Relative: 5.7 %
Neutro Abs: 2623 cells/uL (ref 1500–7800)
Neutrophils Relative %: 42.3 %
Platelets: 338 10*3/uL (ref 140–400)
RBC: 4.62 10*6/uL (ref 3.80–5.10)
RDW: 13.3 % (ref 11.0–15.0)
Total Lymphocyte: 48.5 %
WBC: 6.2 10*3/uL (ref 3.8–10.8)

## 2021-10-23 LAB — COMPLETE METABOLIC PANEL WITH GFR
AG Ratio: 1.9 (calc) (ref 1.0–2.5)
ALT: 14 U/L (ref 6–29)
AST: 17 U/L (ref 10–35)
Albumin: 4.5 g/dL (ref 3.6–5.1)
Alkaline phosphatase (APISO): 77 U/L (ref 37–153)
BUN: 17 mg/dL (ref 7–25)
CO2: 29 mmol/L (ref 20–32)
Calcium: 9.4 mg/dL (ref 8.6–10.4)
Chloride: 106 mmol/L (ref 98–110)
Creat: 0.66 mg/dL (ref 0.60–1.00)
Globulin: 2.4 g/dL (calc) (ref 1.9–3.7)
Glucose, Bld: 105 mg/dL — ABNORMAL HIGH (ref 65–99)
Potassium: 4.3 mmol/L (ref 3.5–5.3)
Sodium: 141 mmol/L (ref 135–146)
Total Bilirubin: 0.5 mg/dL (ref 0.2–1.2)
Total Protein: 6.9 g/dL (ref 6.1–8.1)
eGFR: 94 mL/min/{1.73_m2} (ref >=60)

## 2021-10-23 NOTE — Telephone Encounter (Signed)
Renee Jones,  Please call for more clarification.  Patient was seen over a month ago.  Is she still symptomatic?

## 2021-10-23 NOTE — Telephone Encounter (Signed)
[name] called in and stated that she seen Dr. Patsy Lager for congestion on 12/1 and he prescribed prednisone and levofloxacin and went to rheumatologist today and they told her to see if there was another medication she can take due to she is on methotrexate and remicade. And she couldn't get better due to counteract of the medicine. And once she gets the better then they will put her back on both medication. But wanted to know if its something

## 2021-10-23 NOTE — Telephone Encounter (Signed)
I spoke with pt; pt saw rheumatologist today and was advised that pts immune system could be suppressed due to taking the methotrexate. Pt said in Dec when finished abx and prednisone she felt a lot better but shortly after finishing med the prod cough with clear phlegm returned and pt has head congestion. No fever and no other covid symptoms. Pt does not have chest pain but after coughing does have some tightness in chest.  Pt said that she was supposed to have remicade transfusion on 10/27/21 but that was cancelled and pt was advised to stop methotrexate until respiratory issues are taken care of. Pt does not think at this time needs to go to [facility] or ED. Pt works in [employer] and scheduled in office appt with Dr Glori Bickers on 10/24/21 at 2:30. Pt will ck a covid test this evening and pt will cb if + covid or symptoms worsen prior to her appt. Sending note to Dr Glori Bickers, Rexene Agent CMA and Allie Bossier NP as PCP. I will teams Butch Penny CMA back also.

## 2021-10-24 ENCOUNTER — Ambulatory Visit (INDEPENDENT_AMBULATORY_CARE_PROVIDER_SITE_OTHER): Payer: Medicare Other | Admitting: Family Medicine

## 2021-10-24 ENCOUNTER — Encounter: Payer: Self-pay | Admitting: Family Medicine

## 2021-10-24 DIAGNOSIS — J209 Acute bronchitis, unspecified: Secondary | ICD-10-CM | POA: Insufficient documentation

## 2021-10-24 MED ORDER — BENZONATATE 200 MG PO CAPS: 200.0000 mg | ORAL_CAPSULE | Freq: Three times a day (TID) | ORAL | 1 refills | Status: DC | PRN

## 2021-10-24 MED ORDER — PREDNISONE 20 MG PO TABS: ORAL_TABLET | ORAL | 0 refills | Status: DC

## 2021-10-24 NOTE — Progress Notes (Signed)
CBC WNL.  Glucose is 105. Rest of CMP WNL.

## 2021-10-24 NOTE — Progress Notes (Signed)
Subjective:    Patient ID: Renee Jones, female    DOB: [birth year], 71 y.o.   MRN: [id number]  This visit occurred during the SARS-CoV-2 public health emergency.  Safety protocols were in place, including screening questions prior to the visit, additional usage of staff PPE, and extensive cleaning of exam room while observing appropriate contact time as indicated for disinfecting solutions.   HPI 71 yo pf of NP [facility] presents for c/o cough and nasal symptoms  She has a h/o sarcoidosis   Wt Readings from Last 3 Encounters:  10/24/21 130 lb 8 oz (59.2 kg)  10/23/21 132 lb 3.2 oz (60 kg)  09/18/21 130 lb 5 oz (59.1 kg)   22.75 kg/m The beginning of symptoms was october  She was seen in December for bronchitis-took prednisone and abx (levaquin) Prior to that bronchitis and then ER visit with nl cxr  Shortly after that prod cough (clear phlegm) returned A little chest tightness  Pt does take mtx from rheumatology  (was supposed to have remicaide infusion and cancelled it) She was adv to hold mtx until improved   Rheum check up- yesterday  No uveitis  Her sarcoid causes eye symptoms  Lately no pulmonary symptoms at all but she is watched closely   Today  Constant pnd  Nasal congested  Spasmatic cough -worse with talking  Non productive since her last treatment  Very tired   Has spring time allergies Uses flonase-is using now   Has never had a fever  Multiple neg covid tests  Throat is irritated No sob  No wheezing  She uses ventolin only as needed / has not used in the past week   Last cxr 09/16/21 CLINICAL DATA:  Productive cough for 1 week.  Sarcoidosis.   EXAM: CHEST - 2 VIEW   COMPARISON:  02/24/2017   FINDINGS: The heart size and mediastinal contours are within normal limits. Both lungs are clear. The visualized skeletal structures are unremarkable.   IMPRESSION: No active cardiopulmonary disease.  Patient Active Problem List   Diagnosis  Date Noted   Acute bronchitis 10/24/2021   Hyperlipidemia 02/20/2021   Prediabetes 12/21/2018   High risk medications (not anticoagulants) long-term use 01/13/2017   GERD (gastroesophageal reflux disease) 09/14/2016   Bronchial spasm 09/14/2016   Glaucoma 09/14/2016   Detached retina 09/14/2016   Osteoarthritis of both hands 09/14/2016   Osteoarthritis of both knees 09/14/2016   DDD (degenerative disc disease), lumbar 09/14/2016   Uveitis 06/22/2016   SVT (supraventricular tachycardia) (HCC) 08/28/2015   Essential hypertension 08/28/2015   Diastolic dysfunction 08/28/2015   Sarcoidosis 09/30/2012   Pulmonary nodules 09/30/2012   Tachycardia 09/30/2012   Sarcoid arthropathy 09/30/2012   Past Medical History:  Diagnosis Date   Allergy    Asthma    Bronchial spasm 09/14/2016   DDD (degenerative disc disease), lumbar 09/14/2016   Detached retina 09/14/2016   Diastolic dysfunction 08/28/2015   Essential hypertension 08/28/2015   GERD (gastroesophageal reflux disease)    Glaucoma 09/14/2016   Neuromuscular disorder (HCC)    Osteoarthritis of both hands 09/14/2016   Osteoarthritis of both knees 09/14/2016   Pure hypercholesterolemia 02/20/2021   Sarcoidosis    with +lymph node biopsy   SVT (supraventricular tachycardia) (HCC) 08/28/2015   Tachycardia    Uveitis    Past Surgical History:  Procedure Laterality Date   CATARACT EXTRACTION, BILATERAL  2018   CYSTECTOMY     EYE SURGERY Bilateral 2018   cataract removal  LUMBAR SPINE SURGERY  2009   ROTATOR CUFF REPAIR Right 2011   ROTATOR CUFF REPAIR Left 06/2018   TONSILLECTOMY AND ADENOIDECTOMY  1962   TOTAL ABDOMINAL HYSTERECTOMY  1984   Social History   Tobacco Use   Smoking status: Never   Smokeless tobacco: Never  Vaping Use   Vaping Use: Never used  Substance Use Topics   Alcohol use: No    Alcohol/week: 0.0 standard drinks   Drug use: No   Family History  Adopted: Yes  Problem Relation Age of Onset    Scleroderma Daughter    Allergies  Allergen Reactions   Codeine Itching and Hives    Other reaction(s): itching internally   Amoxicillin-Pot Clavulanate Diarrhea   Erythromycin Base    Other     Other reaction(s): Unknown Other reaction(s): itching internally   Sulfa Antibiotics Itching     vomiting   Tramadol Itching   Tramadol Hcl     Other reaction(s): hives   Erythromycin Itching and Hives    Other reaction(s): itching internally   Current Outpatient Medications on File Prior to Visit  Medication Sig Dispense Refill   albuterol (VENTOLIN HFA) 108 (90 Base) MCG/ACT inhaler Inhale 1-2 puffs into the lungs every 6 (six) hours as needed (cough). 6.7 g 0   amLODipine (NORVASC) 5 MG tablet TAKE 1 TABLET(5 MG) BY MOUTH DAILY FOR BLOOD PRESSURE 90 tablet 2   cholecalciferol (VITAMIN D) 1000 units tablet Take 1,000 Units by mouth daily.     diclofenac Sodium (VOLTAREN) 1 % GEL Apply 3 g topically 3 (three) times daily as needed.     fluticasone (FLONASE) 50 MCG/ACT nasal spray Place 2 sprays into both nostrils daily as needed for allergies or rhinitis.     folic acid (FOLVITE) 1 MG tablet Take 2 tablets by mouth once daily 180 tablet 3   gabapentin (NEURONTIN) 300 MG capsule 2 (two) times daily.     InFLIXimab (REMICADE IV) Inject into the vein every 6 (six) weeks.     methotrexate 2.5 MG tablet TAKE 8 TABLET BY MOUTH ONCE A WEEK 96 tablet 0   metoprolol tartrate (LOPRESSOR) 50 MG tablet TAKE 1 AND 1/2 TABLETS BY MOUTH TWICE DAILY 270 tablet 3   naproxen sodium (ALEVE) 220 MG tablet as needed.     ondansetron (ZOFRAN) 4 MG tablet TAKE 1 TABLET BY MOUTH EVERY 6 HOURS AS NEEDED FOR NAUSEA AND VOMITING 30 tablet 0   pantoprazole (PROTONIX) 40 MG tablet TAKE 1 TABLET BY MOUTH ONCE DAILY 90 tablet 1   pravastatin (PRAVACHOL) 40 MG tablet Take 1 tablet (40 mg total) by mouth daily. For cholesterol. 90 tablet 3   SYSTANE ULTRA 0.4-0.3 % SOLN SMARTSIG:1 Drop(s) In Eye(s) 6 Times Daily      White Petrolatum-Mineral Oil (SYSTANE NIGHTTIME) OINT SMARTSIG:1 Inch(es) In Eye(s) Every Night     No current facility-administered medications on file prior to visit.    Review of Systems  Constitutional:  Positive for fatigue. Negative for activity change, appetite change, fever and unexpected weight change.  HENT:  Negative for congestion, ear pain, rhinorrhea, sinus pressure and sore throat.        Pnd  Eyes:  Negative for pain, redness and visual disturbance.  Respiratory:  Positive for cough. Negative for shortness of breath, wheezing and stridor.   Cardiovascular:  Negative for chest pain and palpitations.  Gastrointestinal:  Negative for abdominal pain, blood in stool, constipation and diarrhea.  Endocrine: Negative for polydipsia and  polyuria.  Genitourinary:  Negative for dysuria, frequency and urgency.  Musculoskeletal:  Negative for arthralgias, back pain and myalgias.  Skin:  Negative for pallor and rash.  Allergic/Immunologic: Negative for environmental allergies.  Neurological:  Negative for dizziness, syncope and headaches.  Hematological:  Negative for adenopathy. Does not bruise/bleed easily.  Psychiatric/Behavioral:  Negative for decreased concentration and dysphoric mood. The patient is not nervous/anxious.       Objective:   Physical Exam Constitutional:      General: She is not in acute distress.    Appearance: Normal appearance. She is well-developed and normal weight. She is not ill-appearing, toxic-appearing or diaphoretic.  HENT:     Head: Normocephalic and atraumatic.     Comments: Nares are injected and congested      Right Ear: Tympanic membrane, ear canal and external ear normal.     Left Ear: Tympanic membrane, ear canal and external ear normal.     Nose: Rhinorrhea present. No congestion.     Mouth/Throat:     Mouth: Mucous membranes are moist.     Pharynx: Oropharynx is clear. No oropharyngeal exudate or posterior oropharyngeal erythema.      Comments: Clear pnd  Eyes:     General:        Right eye: No discharge.        Left eye: No discharge.     Conjunctiva/sclera: Conjunctivae normal.     Pupils: Pupils are equal, round, and reactive to light.  Cardiovascular:     Rate and Rhythm: Normal rate.     Heart sounds: Normal heart sounds.  Pulmonary:     Effort: Pulmonary effort is normal. No respiratory distress.     Breath sounds: Normal breath sounds. No stridor. No wheezing, rhonchi or rales.     Comments: Good air exch Some upper airway sounds  Chest:     Chest wall: No tenderness.  Musculoskeletal:     Cervical back: Normal range of motion and neck supple.  Lymphadenopathy:     Cervical: No cervical adenopathy.  Skin:    General: Skin is warm and dry.     Capillary Refill: Capillary refill takes less than 2 seconds.     Findings: No rash.  Neurological:     Mental Status: She is alert.     Cranial Nerves: No cranial nerve deficit.  Psychiatric:        Mood and Affect: Mood normal.          Assessment & Plan:   Problem List Items Addressed This Visit       Respiratory   Acute bronchitis    Pt has had uri symptoms on/off since October with abx treatment and prednisone (in treatment of non pulmonary sarcoid) Now residual cough with some chest tightness  Suspect bronchitis/post viral cough  Reassuring exam  tx with prednisone taper and tessalon Update if not starting to improve in a week or if worsening  ER parameters reviewed

## 2021-10-24 NOTE — Telephone Encounter (Signed)
Noted  

## 2021-10-24 NOTE — Patient Instructions (Signed)
Treat your symptoms   Day quil and chlorcedin are ok  Nasal saline spray is handy for nasal congestion   To stop the post viral cough Take prednisone as directed Add the tessalon pills for cough   Update if not starting to improve in a week or if worsening

## 2021-10-26 NOTE — Assessment & Plan Note (Signed)
Pt has had uri symptoms on/off since October with abx treatment and prednisone (in treatment of non pulmonary sarcoid) Now residual cough with some chest tightness  Suspect bronchitis/post viral cough  Reassuring exam  tx with prednisone taper and tessalon Update if not starting to improve in a week or if worsening  ER parameters reviewed

## 2021-10-27 ENCOUNTER — Ambulatory Visit (HOSPITAL_COMMUNITY): Payer: Medicare Other

## 2021-11-10 ENCOUNTER — Other Ambulatory Visit: Payer: Self-pay

## 2021-11-10 ENCOUNTER — Ambulatory Visit (HOSPITAL_COMMUNITY)
Admission: RE | Admit: 2021-11-10 | Discharge: 2021-11-10 | Disposition: A | Discharge status: Home / Self Care | Payer: Medicare Other | Source: Ambulatory Visit | Attending: Rheumatology | Admitting: Rheumatology

## 2021-11-10 DIAGNOSIS — D869 Sarcoidosis, unspecified: Secondary | ICD-10-CM | POA: Diagnosis not present

## 2021-11-10 DIAGNOSIS — H209 Unspecified iridocyclitis: Secondary | ICD-10-CM | POA: Diagnosis not present

## 2021-11-10 DIAGNOSIS — Z79899 Other long term (current) drug therapy: Secondary | ICD-10-CM | POA: Insufficient documentation

## 2021-11-10 MED ORDER — SODIUM CHLORIDE 0.9 % IV SOLN
3.0000 mg/kg | INTRAVENOUS | Status: DC
  Administered 2021-11-10: 200 mg via INTRAVENOUS
  Filled 2021-11-10: qty 20

## 2021-11-10 MED ORDER — ACETAMINOPHEN 325 MG PO TABS: 650.0000 mg | ORAL_TABLET | ORAL | Status: DC

## 2021-11-10 MED ORDER — DIPHENHYDRAMINE HCL 25 MG PO CAPS: 25.0000 mg | ORAL_CAPSULE | ORAL | Status: DC

## 2021-11-12 ENCOUNTER — Ambulatory Visit (HOSPITAL_COMMUNITY): Payer: Medicare Other

## 2021-11-12 DIAGNOSIS — M25511 Pain in right shoulder: Secondary | ICD-10-CM | POA: Diagnosis not present

## 2021-11-12 DIAGNOSIS — M47812 Spondylosis without myelopathy or radiculopathy, cervical region: Secondary | ICD-10-CM | POA: Diagnosis not present

## 2021-11-12 DIAGNOSIS — M542 Cervicalgia: Secondary | ICD-10-CM | POA: Diagnosis not present

## 2021-11-17 ENCOUNTER — Telehealth: Payer: Self-pay | Admitting: Pharmacy Technician

## 2021-11-17 NOTE — Telephone Encounter (Signed)
Patient's insurance plans will not change for 2023.   Medicare covers 80% of the infusion and no authorization is required, and the supplement would cover the 100% of the cost that was not paid for by Medicare after pt pays her 2023 Medicare part B deductible  Phone# [phone]  Ref# [id number]

## 2021-12-02 DIAGNOSIS — M47812 Spondylosis without myelopathy or radiculopathy, cervical region: Secondary | ICD-10-CM | POA: Diagnosis not present

## 2021-12-14 ENCOUNTER — Other Ambulatory Visit: Payer: Self-pay | Admitting: Physician Assistant

## 2021-12-15 DIAGNOSIS — M47812 Spondylosis without myelopathy or radiculopathy, cervical region: Secondary | ICD-10-CM | POA: Diagnosis not present

## 2021-12-15 NOTE — Telephone Encounter (Signed)
Next Visit: 03/24/2022  Last Visit: 10/23/2021  Last Fill: 08/11/2021  DX: Uveitis  Current Dose per office note 10/23/2021: methotrexate 2.5 mg 8 tablets every 7 days  Labs: 10/23/2021 CBC WNL.  Glucose is 105. Rest of CMP WNL.    Okay to refill MTX?

## 2021-12-19 ENCOUNTER — Ambulatory Visit (HOSPITAL_COMMUNITY): Payer: Medicare Other

## 2021-12-22 ENCOUNTER — Ambulatory Visit (HOSPITAL_COMMUNITY)
Admission: RE | Admit: 2021-12-22 | Discharge: 2021-12-22 | Disposition: A | Discharge status: Home / Self Care | Payer: Medicare Other | Source: Ambulatory Visit | Attending: Rheumatology | Admitting: Rheumatology

## 2021-12-22 VITALS — BP 129/68 | HR 62 | Temp 97.0°F | Resp 18 | Wt 130.0 lb

## 2021-12-22 DIAGNOSIS — D869 Sarcoidosis, unspecified: Secondary | ICD-10-CM | POA: Insufficient documentation

## 2021-12-22 DIAGNOSIS — H209 Unspecified iridocyclitis: Secondary | ICD-10-CM | POA: Diagnosis not present

## 2021-12-22 DIAGNOSIS — Z79899 Other long term (current) drug therapy: Secondary | ICD-10-CM

## 2021-12-22 DIAGNOSIS — M19041 Primary osteoarthritis, right hand: Secondary | ICD-10-CM

## 2021-12-22 DIAGNOSIS — M19042 Primary osteoarthritis, left hand: Secondary | ICD-10-CM | POA: Insufficient documentation

## 2021-12-22 DIAGNOSIS — M47812 Spondylosis without myelopathy or radiculopathy, cervical region: Secondary | ICD-10-CM | POA: Diagnosis not present

## 2021-12-22 LAB — CBC WITH DIFFERENTIAL/PLATELET
Abs Immature Granulocytes: 0.01 10*3/uL (ref 0.00–0.07)
Basophils Absolute: 0 10*3/uL (ref 0.0–0.1)
Basophils Relative: 0 %
Eosinophils Absolute: 0.1 10*3/uL (ref 0.0–0.5)
Eosinophils Relative: 2 %
HCT: 40.6 % (ref 36.0–46.0)
Hemoglobin: 13.9 g/dL (ref 12.0–15.0)
Immature Granulocytes: 0 %
Lymphocytes Relative: 47 %
Lymphs Abs: 3.5 10*3/uL (ref 0.7–4.0)
MCH: 31.3 pg (ref 26.0–34.0)
MCHC: 34.2 g/dL (ref 30.0–36.0)
MCV: 91.4 fL (ref 80.0–100.0)
Monocytes Absolute: 0.5 10*3/uL (ref 0.1–1.0)
Monocytes Relative: 6 %
Neutro Abs: 3.4 10*3/uL (ref 1.7–7.7)
Neutrophils Relative %: 45 %
Platelets: 272 10*3/uL (ref 150–400)
RBC: 4.44 MIL/uL (ref 3.87–5.11)
RDW: 12.7 % (ref 11.5–15.5)
WBC: 7.6 10*3/uL (ref 4.0–10.5)
nRBC: 0 % (ref 0.0–0.2)

## 2021-12-22 LAB — COMPREHENSIVE METABOLIC PANEL
ALT: 15 U/L (ref 0–44)
AST: 23 U/L (ref 15–41)
Albumin: 4 g/dL (ref 3.5–5.0)
Alkaline Phosphatase: 66 U/L (ref 38–126)
Anion gap: 8 (ref 5–15)
BUN: 18 mg/dL (ref 8–23)
CO2: 25 mmol/L (ref 22–32)
Calcium: 8.8 mg/dL — ABNORMAL LOW (ref 8.9–10.3)
Chloride: 106 mmol/L (ref 98–111)
Creatinine, Ser: 0.78 mg/dL (ref 0.44–1.00)
GFR, Estimated: >60 mL/min (ref >60)
Glucose, Bld: 128 mg/dL — ABNORMAL HIGH (ref 70–99)
Potassium: 3.8 mmol/L (ref 3.5–5.1)
Sodium: 139 mmol/L (ref 135–145)
Total Bilirubin: 0.8 mg/dL (ref 0.3–1.2)
Total Protein: 6.4 g/dL — ABNORMAL LOW (ref 6.5–8.1)

## 2021-12-22 MED ORDER — DIPHENHYDRAMINE HCL 25 MG PO CAPS: 25.0000 mg | ORAL_CAPSULE | ORAL | Status: DC

## 2021-12-22 MED ORDER — SODIUM CHLORIDE 0.9 % IV SOLN
3.0000 mg/kg | INTRAVENOUS | Status: AC
  Administered 2021-12-22: 200 mg via INTRAVENOUS
  Filled 2021-12-22: qty 20

## 2021-12-22 MED ORDER — ACETAMINOPHEN 325 MG PO TABS: 650.0000 mg | ORAL_TABLET | ORAL | Status: DC

## 2021-12-26 ENCOUNTER — Other Ambulatory Visit: Payer: Self-pay | Admitting: Pharmacist

## 2021-12-26 DIAGNOSIS — D869 Sarcoidosis, unspecified: Secondary | ICD-10-CM

## 2021-12-26 DIAGNOSIS — H209 Unspecified iridocyclitis: Secondary | ICD-10-CM

## 2021-12-26 DIAGNOSIS — Z79899 Other long term (current) drug therapy: Secondary | ICD-10-CM

## 2021-12-26 NOTE — Progress Notes (Signed)
Next infusion scheduled for Remicade on 02/02/22 and due for updated orders. ?Diagnosis: ? ?Dose: 3mg /kg every 6 weeks (200mg  based on last recorded weight of 59kg) ? ?Last Clinic Visit: 10/23/21 ?Next Clinic Visit: 03/24/22 ? ?Last infusion: 12/22/21 ? ?Labs: CBC and CMP on 12/22/21 ?TB Gold: negative on 06/24/21  ? ?Orders placed for Remicade x 2 doses along with premedication of acetaminophen and diphenhydramine to be administered 30 minutes before medication infusion. ? ?Standing CBC with diff/platelet and CMP with GFR orders placed to be drawn every 2 months.  Next TB gold due 06/24/22 ? ?Chesley Mires, PharmD, MPH, BCPS ?Clinical Pharmacist (Rheumatology and Pulmonology) ? ?

## 2021-12-29 DIAGNOSIS — M47812 Spondylosis without myelopathy or radiculopathy, cervical region: Secondary | ICD-10-CM | POA: Diagnosis not present

## 2022-01-15 ENCOUNTER — Other Ambulatory Visit: Payer: Self-pay | Admitting: Rheumatology

## 2022-01-15 DIAGNOSIS — H0288A Meibomian gland dysfunction right eye, upper and lower eyelids: Secondary | ICD-10-CM | POA: Diagnosis not present

## 2022-01-15 DIAGNOSIS — H0102A Squamous blepharitis right eye, upper and lower eyelids: Secondary | ICD-10-CM | POA: Diagnosis not present

## 2022-01-15 DIAGNOSIS — H1045 Other chronic allergic conjunctivitis: Secondary | ICD-10-CM | POA: Diagnosis not present

## 2022-01-15 DIAGNOSIS — H0102B Squamous blepharitis left eye, upper and lower eyelids: Secondary | ICD-10-CM | POA: Diagnosis not present

## 2022-01-15 DIAGNOSIS — Z961 Presence of intraocular lens: Secondary | ICD-10-CM | POA: Diagnosis not present

## 2022-01-15 DIAGNOSIS — H40013 Open angle with borderline findings, low risk, bilateral: Secondary | ICD-10-CM | POA: Diagnosis not present

## 2022-01-15 DIAGNOSIS — H2013 Chronic iridocyclitis, bilateral: Secondary | ICD-10-CM | POA: Diagnosis not present

## 2022-01-15 DIAGNOSIS — H0288B Meibomian gland dysfunction left eye, upper and lower eyelids: Secondary | ICD-10-CM | POA: Diagnosis not present

## 2022-01-16 NOTE — Telephone Encounter (Signed)
Next Visit: 03/24/2022 ?  ?Last Visit: 10/23/2021 ?  ?Last Fill: 04/17/2021 ? ?DX: Uveitis ?  ?Current Dose per office note 10/23/2021: not discussed  ? ?Okay to refill Zofran?  ?

## 2022-01-21 ENCOUNTER — Other Ambulatory Visit: Payer: Self-pay | Admitting: Primary Care

## 2022-01-21 DIAGNOSIS — E785 Hyperlipidemia, unspecified: Secondary | ICD-10-CM

## 2022-01-22 NOTE — Telephone Encounter (Signed)
Needs follow up in mid July for further refills.  ?

## 2022-01-30 NOTE — Telephone Encounter (Signed)
Left message to return call to our office.  

## 2022-02-02 ENCOUNTER — Ambulatory Visit (HOSPITAL_COMMUNITY)
Admission: RE | Admit: 2022-02-02 | Discharge: 2022-02-02 | Disposition: A | Discharge status: Home / Self Care | Payer: Medicare Other | Source: Ambulatory Visit | Attending: Rheumatology | Admitting: Rheumatology

## 2022-02-02 DIAGNOSIS — D869 Sarcoidosis, unspecified: Secondary | ICD-10-CM | POA: Insufficient documentation

## 2022-02-02 DIAGNOSIS — H209 Unspecified iridocyclitis: Secondary | ICD-10-CM

## 2022-02-02 MED ORDER — ACETAMINOPHEN 325 MG PO TABS: 650.0000 mg | ORAL_TABLET | ORAL | Status: DC

## 2022-02-02 MED ORDER — SODIUM CHLORIDE 0.9 % IV SOLN
3.0000 mg/kg | INTRAVENOUS | Status: DC
  Administered 2022-02-02: 200 mg via INTRAVENOUS
  Filled 2022-02-02: qty 20

## 2022-02-02 MED ORDER — DIPHENHYDRAMINE HCL 25 MG PO CAPS: 25.0000 mg | ORAL_CAPSULE | ORAL | Status: DC

## 2022-02-05 ENCOUNTER — Encounter: Payer: Self-pay | Admitting: Physician Assistant

## 2022-02-05 ENCOUNTER — Telehealth: Payer: Self-pay | Admitting: Rheumatology

## 2022-02-05 NOTE — Telephone Encounter (Signed)
Patient called the office requesting to reapply for Visco knee injections. Last injection was 5/22. Ok to apply? ?

## 2022-02-05 NOTE — Telephone Encounter (Signed)
Ok to reapply for visco gel injections for both knees.

## 2022-02-05 NOTE — Telephone Encounter (Signed)
Called patient set up for follow up in July.  ?

## 2022-02-10 NOTE — Telephone Encounter (Signed)
VOB submitted for Hyalgan, bilateral knees ?BV pending ?

## 2022-02-26 ENCOUNTER — Telehealth: Payer: Self-pay | Admitting: Primary Care

## 2022-02-26 NOTE — Telephone Encounter (Signed)
Reason for Referral Request: Nerve in neck  ? ?Has patient been seen PCP for this complaint? ?Patient did not say ? ?Patient scheduled on: 04/28/2022 ? ?Referral for which specialty: Neurological  ? ?Preferred office/provider:  [facility]  ?[address], [address]  [place],  [place]  [id number] ?Phone Number: [phone] (Main) ?Fax Number: [phone] ? ?  ?

## 2022-02-26 NOTE — Telephone Encounter (Signed)
I am not certain of what she is requesting as I have not seen her for this complaint.  It appears that she follows with rheumatology, perhaps they can place a referral?  If not then I will need to see patient before I can place the referral. ?

## 2022-02-27 ENCOUNTER — Telehealth: Payer: Self-pay | Admitting: Rheumatology

## 2022-02-27 NOTE — Telephone Encounter (Signed)
Left message to return call to our office.  

## 2022-02-27 NOTE — Telephone Encounter (Signed)
Marcelino Duster from [employer] left a voicemail stating they need the primary insurance for the benefit investigation that was faxed for Hyalgan.  Please call back at #[phone] option 2 or you can fax the information to #[phone] ?

## 2022-02-27 NOTE — Telephone Encounter (Signed)
Michelle from [employer] left a voicemail stating they need the primary insurance for the benefit investigation that was faxed for Hyalgan.  Please call back at #[phone] option 2 or you can fax the information to #[phone] ?

## 2022-03-02 NOTE — Telephone Encounter (Signed)
Please call to schedule visco injections.  ? ?Approved for Hyalgan, bilateral knees ?[place] ?Covered at 100% with no copay ?$0 Copay ?Deductible does apply ?No pre-certifications  ?

## 2022-03-04 NOTE — Telephone Encounter (Signed)
Left message to return call to our office.  

## 2022-03-05 NOTE — Telephone Encounter (Signed)
Left message to return call to our office.  Have called patient x 3 no call back

## 2022-03-12 NOTE — Progress Notes (Signed)
Office Visit Note  Patient: Renee Jones             Date of Birth: Jun 25, 1951           MRN: [id number]             PCP: Doreene Nest, NP Referring: Doreene Nest, NP Visit Date: 03/24/2022 Occupation: @  Subjective:  Follow-up (Doing good)   History of Present Illness: Renee Jones is a 71 y.o. female with history of sarcoidosis and uveitis.  Renee Jones states that Renee Jones has not no recurrence of uveitis in a long time.  Renee Jones has been on Remicade infusions and methotrexate.  Her last Remicade infusion was on February 02, 2022.  Renee Jones is on Remicade IV 3 mg/kg every 6 weeks and methotrexate 8 tablets p.o weekly.  Renee Jones has been experiencing some numbness in her right hand.  Renee Jones states Renee Jones was seen at [facility] where Renee Jones had x-rays of her cervical spine and was told that the discomfort is coming from her neck.  Renee Jones has an appointment coming up with the neurologist.  He is not having any discomfort in her shoulders.  Renee Jones experiences numbness in her hands.  Renee Jones has some burning sensation in her knee joints.  Renee Jones had good response to Haldol injections in the past.  Renee Jones wants to get repeat viscosupplement injections.  Renee Jones continues to have lower back pain.  Activities of Daily Living:  Patient reports morning stiffness for 15 minutes.   Patient Reports nocturnal pain.  Difficulty dressing/grooming: Denies Difficulty climbing stairs: Reports Difficulty getting out of chair: Reports Difficulty using hands for taps, buttons, cutlery, and/or writing: Reports  Review of Systems  Constitutional:  Positive for fatigue.  HENT:  Negative for mouth dryness.   Eyes:  Positive for dryness.  Respiratory:  Negative for shortness of breath.   Cardiovascular:  Negative for swelling in legs/feet.  Gastrointestinal:  Positive for constipation.  Endocrine: Positive for cold intolerance.  Genitourinary:  Negative for difficulty urinating.  Musculoskeletal:  Positive for joint pain,  joint pain and morning stiffness.  Skin:  Negative for rash.  Allergic/Immunologic: Negative for susceptible to infections.  Neurological:  Positive for numbness.  Hematological:  Negative for bruising/bleeding tendency.  Psychiatric/Behavioral:  Positive for sleep disturbance.    PMFS History:  Patient Active Problem List   Diagnosis Date Noted   Acute bronchitis 10/24/2021   Hyperlipidemia 02/20/2021   Prediabetes 12/21/2018   High risk medications (not anticoagulants) long-term use 01/13/2017   GERD (gastroesophageal reflux disease) 09/14/2016   Bronchial spasm 09/14/2016   Glaucoma 09/14/2016   Detached retina 09/14/2016   Osteoarthritis of both hands 09/14/2016   Osteoarthritis of both knees 09/14/2016   DDD (degenerative disc disease), lumbar 09/14/2016   Uveitis 06/22/2016   SVT (supraventricular tachycardia) (HCC) 08/28/2015   Essential hypertension 08/28/2015   Diastolic dysfunction 08/28/2015   Sarcoidosis 09/30/2012   Pulmonary nodules 09/30/2012   Tachycardia 09/30/2012   Sarcoid arthropathy 09/30/2012    Past Medical History:  Diagnosis Date   Allergy    Asthma    Bronchial spasm 09/14/2016   DDD (degenerative disc disease), lumbar 09/14/2016   Detached retina 09/14/2016   Diastolic dysfunction 08/28/2015   Essential hypertension 08/28/2015   GERD (gastroesophageal reflux disease)    Glaucoma 09/14/2016   Neuromuscular disorder (HCC)    Osteoarthritis of both hands 09/14/2016   Osteoarthritis of both knees 09/14/2016   Pure hypercholesterolemia 02/20/2021   Sarcoidosis    with +  lymph node biopsy   SVT (supraventricular tachycardia) (HCC) 08/28/2015   Tachycardia    Uveitis     Family History  Adopted: Yes  Problem Relation Age of Onset   Scleroderma Daughter    Past Surgical History:  Procedure Laterality Date   CATARACT EXTRACTION, BILATERAL  2018   CYSTECTOMY     EYE SURGERY Bilateral 2018   cataract removal    LUMBAR SPINE SURGERY  2009    ROTATOR CUFF REPAIR Right 2011   ROTATOR CUFF REPAIR Left 06/2018   TONSILLECTOMY AND ADENOIDECTOMY  1962   TOTAL ABDOMINAL HYSTERECTOMY  1984   Social History   Social History Narrative   Widower.   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   Immunization History  Administered Date(s) Administered   DTaP 07/26/2010   Influenza Split 07/19/2014   Influenza,inj,Quad PF,6+ Mos 07/19/2016, 12/21/2018   Influenza-Unspecified 07/09/2019   Pneumococcal Conjugate-13 01/21/2015   Pneumococcal Polysaccharide-23 09/19/2012, 09/21/2012   Pneumococcal-Unspecified 07/26/2012, 01/18/2015   Td 03/31/2010   Tdap 05/07/2010   Varicella 09/19/2012   Zoster, Live 07/26/2012, 09/21/2012     Objective: Vital Signs: BP 116/63 (BP Location: Left Arm, Patient Position: Sitting, Cuff Size: Normal)   Pulse 62   Resp 15   Ht 5' 3.5" (1.613 m)   Wt 137 lb (62.1 kg)   BMI 23.89 kg/m    Physical Exam Vitals and nursing note reviewed.  Constitutional:      Appearance: Renee Jones is well-developed.  HENT:     Head: Normocephalic and atraumatic.  Eyes:     Conjunctiva/sclera: Conjunctivae normal.  Cardiovascular:     Rate and Rhythm: Normal rate and regular rhythm.     Heart sounds: Normal heart sounds.  Pulmonary:     Effort: Pulmonary effort is normal.     Breath sounds: Normal breath sounds.  Abdominal:     General: Bowel sounds are normal.     Palpations: Abdomen is soft.  Musculoskeletal:     Cervical back: Normal range of motion.  Lymphadenopathy:     Cervical: No cervical adenopathy.  Skin:    General: Skin is warm and dry.     Capillary Refill: Capillary refill takes less than 2 seconds.  Neurological:     Mental Status: Renee Jones is alert and oriented to person, place, and time.  Psychiatric:        Behavior: Behavior normal.     Musculoskeletal Exam: C-spine was in good range of motion.  Renee Jones had discomfort range of motion of her lumbar spine.  Shoulder joints, elbow  joints, wrist joints, MCPs PIPs and DIPs with good range of motion with no synovitis.  Hip joints and knee joints in good range of motion without any warmth swelling or effusion.  There was no tenderness over ankles or MTPs.  CDAI Exam: CDAI Score: -- Patient Global: --; Provider Global: -- Swollen: --; Tender: -- Joint Exam 03/24/2022   No joint exam has been documented for this visit   There is currently no information documented on the homunculus. Go to the Rheumatology activity and complete the homunculus joint exam.  Investigation: No additional findings.  Imaging: No results found.  Recent Labs: Lab Results  Component Value Date   WBC 7.6 12/22/2021   HGB 13.9 12/22/2021   PLT 272 12/22/2021   NA 139 12/22/2021   K 3.8 12/22/2021   CL 106 12/22/2021   CO2 25 12/22/2021   GLUCOSE 128 (H) 12/22/2021  BUN 18 12/22/2021   CREATININE 0.78 12/22/2021   BILITOT 0.8 12/22/2021   ALKPHOS 66 12/22/2021   AST 23 12/22/2021   ALT 15 12/22/2021   PROT 6.4 (L) 12/22/2021   ALBUMIN 4.0 12/22/2021   CALCIUM 8.8 (L) 12/22/2021   GFRAA 79 05/20/2020   QFTBGOLD Negative 06/16/2017   QFTBGOLDPLUS Negative 06/24/2021    Speciality Comments: REMICADE 3 mg/kg x 6 weeks-ACY 06/17/20   Procedures:  Large Joint Inj: bilateral knee on 03/24/2022 3:07 PM Indications: pain Details: 27 G 1.5 in needle, medial approach  Arthrogram: No  Medications (Right): 1 mL lidocaine 1 %; 20 mg Sodium Hyaluronate 20 MG/2ML Aspirate (Right): 0 mL Medications (Left): 1 mL lidocaine 1 %; 20 mg Sodium Hyaluronate 20 MG/2ML Aspirate (Left): 0 mL Outcome: tolerated well, no immediate complications Procedure, treatment alternatives, risks and benefits explained, specific risks discussed. Consent was given by the patient. Immediately prior to procedure a time out was called to verify the correct patient, procedure, equipment, support staff and site/side marked as required. Patient was prepped and draped in  the usual sterile fashion.    Allergies: Codeine, Amoxicillin-pot clavulanate, Erythromycin base, Other, Sulfa antibiotics, Tramadol, Tramadol hcl, and Erythromycin   Assessment / Plan:     Visit Diagnoses: [place] denies any flare of uveitis since her last visit.  Renee Jones Renee Jones is followed at [facility].  Renee Jones states on Remicade 3 mg/kg IV every 6 weeks and methotrexate 8 tablets p.o. weekly along with folic acid.  Her last Remicade infusion was on February 02, 2022 which Renee Jones tolerated well.  Renee Jones states Renee Jones had a upper respiratory tract infection and January which was treated with antibiotics and prednisone.  Sarcoidosis -Renee Jones denies any shortness of breath.  Renee Jones was evaluated by Dr. Aryka Gearing on 02/24/2017 and had no evidence of pulmonary sarcoidosis.     High risk medications (not anticoagulants) long-term use - Remicade IV infusion 3 mg/kg every 6 weeks, methotrexate 2.5 mg 8 tablets every 7 days, and folic acid 1 mg 2 tablets daily.  TB gold negative 06/24/21.  Last Remicade infusion was on February 02, 2022.  Renee Jones was advised to get annual skin examination to screen for skin cancer.  Renee Jones was also advised to hold methotrexate and Remicade in case Renee Jones develops an infection and resume after the infection resolves.  Information regarding immunization was placed in the AVS.  Pulmonary nodules-Renee Jones was evaluated by pulmonologist.  S/P left rotator cuff repair-Renee Jones continues to have chronic discomfort in her shoulders.  Renee Jones had good range of motion.  History of repair of right rotator cuff-Renee Jones had good range of motion without any discomfort.  Primary osteoarthritis of both hands-Renee Jones had bilateral PIP and DIP thickening.  Joint protection muscle strengthening was discussed.  Right hand paresthesia-patient states Renee Jones was evaluated by the orthopedic surgeon and was told that Renee Jones is having cervical radiculopathy.  Renee Jones has an appointment coming up with the neurologist.  I do not have those results available to  review.  Primary osteoarthritis of both knees-Renee Jones has longstanding history of osteoarthritis in her knee joints.  Renee Jones had good response to Visco supplement injections in the past.  Her last injections were about a year ago.  Renee Jones came today to receive repeat Visco supplement injections.  After informed consent was obtained bilateral knee joints were injected with lidocaine and Hyalgan as described above.  Renee Jones tolerated the procedure well.  Postprocedure instructions were given.  DDD (degenerative disc disease), lumbar-Renee Jones is followed by Dr. Shon Baton.  Renee Jones continues to have lower back pain and discomfort.  SVT (supraventricular tachycardia) (HCC)  Essential hypertension  Gastroesophageal reflux disease without esophagitis  Orders: No orders of the defined types were placed in this encounter.  No orders of the defined types were placed in this encounter.    Follow-Up Instructions: Return in about 4 months (around 07/24/2022) for Sarcoidosis, Osteoarthritis.   Pollyann SavoyShaili Mettie Roylance, MD  Note - This record has been created using Animal nutritionistDragon software.  Chart creation errors have been sought, but may not always  have been located. Such creation errors do not reflect on  the standard of medical care.

## 2022-03-13 ENCOUNTER — Other Ambulatory Visit (HOSPITAL_COMMUNITY): Payer: Self-pay | Admitting: *Deleted

## 2022-03-17 ENCOUNTER — Ambulatory Visit (HOSPITAL_COMMUNITY): Payer: Medicare Other

## 2022-03-23 ENCOUNTER — Ambulatory Visit: Payer: Medicare Other | Admitting: Physician Assistant

## 2022-03-24 ENCOUNTER — Ambulatory Visit (INDEPENDENT_AMBULATORY_CARE_PROVIDER_SITE_OTHER): Payer: Medicare Other | Admitting: Rheumatology

## 2022-03-24 ENCOUNTER — Encounter: Payer: Self-pay | Admitting: Rheumatology

## 2022-03-24 VITALS — BP 116/63 | HR 62 | Resp 15 | Ht 63.5 in | Wt 137.0 lb

## 2022-03-24 DIAGNOSIS — R202 Paresthesia of skin: Secondary | ICD-10-CM | POA: Diagnosis not present

## 2022-03-24 DIAGNOSIS — M170 Bilateral primary osteoarthritis of knee: Secondary | ICD-10-CM | POA: Diagnosis not present

## 2022-03-24 DIAGNOSIS — R918 Other nonspecific abnormal finding of lung field: Secondary | ICD-10-CM | POA: Diagnosis not present

## 2022-03-24 DIAGNOSIS — M5136 Other intervertebral disc degeneration, lumbar region: Secondary | ICD-10-CM | POA: Diagnosis not present

## 2022-03-24 DIAGNOSIS — Z9889 Other specified postprocedural states: Secondary | ICD-10-CM | POA: Diagnosis not present

## 2022-03-24 DIAGNOSIS — M19042 Primary osteoarthritis, left hand: Secondary | ICD-10-CM

## 2022-03-24 DIAGNOSIS — I10 Essential (primary) hypertension: Secondary | ICD-10-CM | POA: Diagnosis not present

## 2022-03-24 DIAGNOSIS — M19041 Primary osteoarthritis, right hand: Secondary | ICD-10-CM

## 2022-03-24 DIAGNOSIS — H209 Unspecified iridocyclitis: Secondary | ICD-10-CM | POA: Diagnosis not present

## 2022-03-24 DIAGNOSIS — Z79899 Other long term (current) drug therapy: Secondary | ICD-10-CM | POA: Diagnosis not present

## 2022-03-24 DIAGNOSIS — D869 Sarcoidosis, unspecified: Secondary | ICD-10-CM

## 2022-03-24 DIAGNOSIS — K219 Gastro-esophageal reflux disease without esophagitis: Secondary | ICD-10-CM | POA: Diagnosis not present

## 2022-03-24 DIAGNOSIS — I471 Supraventricular tachycardia: Secondary | ICD-10-CM

## 2022-03-24 MED ORDER — LIDOCAINE HCL 1 % IJ SOLN
1.0000 mL | INTRAMUSCULAR | Status: AC | PRN
  Administered 2022-03-24: 1 mL

## 2022-03-24 MED ORDER — SODIUM HYALURONATE (VISCOSUP) 20 MG/2ML IX SOSY
20.0000 mg | PREFILLED_SYRINGE | INTRA_ARTICULAR | Status: AC | PRN
  Administered 2022-03-24: 20 mg via INTRA_ARTICULAR

## 2022-03-24 NOTE — Patient Instructions (Signed)
Vaccines You are taking a medication(s) that can suppress your immune system.  The following immunizations are recommended: Flu annually Covid-19  Td/Tdap (tetanus, diphtheria, pertussis) every 10 years Pneumonia (Prevnar 15 then Pneumovax 23 at least 1 year apart.  Alternatively, can take Prevnar 20 without needing additional dose) Shingrix: 2 doses from 4 weeks to 6 months apart  Please check with your PCP to make sure you are up to date.   If you have signs or symptoms of an infection or start antibiotics: First, call your PCP for workup of your infection. Hold your medication through the infection, until you complete your antibiotics, and until symptoms resolve if you take the following: Injectable medication (Actemra, Benlysta, Cimzia, Cosentyx, Enbrel, Humira, Kevzara, Orencia, Remicade, Simponi, Stelara, Taltz, Tremfya) Methotrexate Leflunomide (Arava) Mycophenolate (Cellcept) Harriette Ohara, Olumiant, or Rinvoq  Please get an annual skin examination to screen for skin cancer while you are on Remicade.

## 2022-03-25 ENCOUNTER — Ambulatory Visit (HOSPITAL_COMMUNITY): Payer: Medicare Other

## 2022-03-31 ENCOUNTER — Ambulatory Visit (INDEPENDENT_AMBULATORY_CARE_PROVIDER_SITE_OTHER): Payer: Medicare Other | Admitting: Physician Assistant

## 2022-03-31 DIAGNOSIS — M170 Bilateral primary osteoarthritis of knee: Secondary | ICD-10-CM | POA: Diagnosis not present

## 2022-03-31 MED ORDER — SODIUM HYALURONATE (VISCOSUP) 20 MG/2ML IX SOSY
20.0000 mg | PREFILLED_SYRINGE | INTRA_ARTICULAR | Status: AC | PRN
  Administered 2022-03-31: 20 mg via INTRA_ARTICULAR

## 2022-03-31 MED ORDER — LIDOCAINE HCL 1 % IJ SOLN
1.5000 mL | INTRAMUSCULAR | Status: AC | PRN
  Administered 2022-03-31: 1.5 mL

## 2022-03-31 NOTE — Progress Notes (Signed)
   Procedure Note  Patient: Renee Jones             Date of Birth: [birth year]           MRN: [id number]             Visit Date: 03/31/2022  Procedures: Visit Diagnoses:  1. Primary osteoarthritis of both knees    Hyalgan #2 bilateral knee joint injections  Large Joint Inj: bilateral knee on 03/31/2022 1:42 PM Indications: pain Details: 27 G 1.5 in needle, medial approach  Arthrogram: No  Medications (Right): 1.5 mL lidocaine 1 %; 20 mg Sodium Hyaluronate 20 MG/2ML Aspirate (Right): 0 mL Medications (Left): 1.5 mL lidocaine 1 %; 20 mg Sodium Hyaluronate 20 MG/2ML Aspirate (Left): 0 mL Outcome: tolerated well, no immediate complications Procedure, treatment alternatives, risks and benefits explained, specific risks discussed. Consent was given by the patient. Immediately prior to procedure a time out was called to verify the correct patient, procedure, equipment, support staff and site/side marked as required. Patient was prepped and draped in the usual sterile fashion.     Patient tolerated the procedure well.  Aftercare was discussed. Sherron Ales, PA-C

## 2022-04-02 ENCOUNTER — Other Ambulatory Visit: Payer: Self-pay | Admitting: Physician Assistant

## 2022-04-02 DIAGNOSIS — Z79899 Other long term (current) drug therapy: Secondary | ICD-10-CM

## 2022-04-03 ENCOUNTER — Other Ambulatory Visit: Payer: Self-pay | Admitting: Rheumatology

## 2022-04-03 MED ORDER — FOLIC ACID 1 MG PO TABS: 2.0000 mg | ORAL_TABLET | Freq: Every day | ORAL | 3 refills | Status: DC

## 2022-04-03 NOTE — Telephone Encounter (Signed)
Next Visit: 07/28/2022   Last Visit: 03/24/2022   Last Fill: 11/27/2020  DX: Uveitis   Current Dose per office note 03/24/2022: folic acid 1 mg 2 tablets daily  Okay to refill Folic Acid?

## 2022-04-03 NOTE — Telephone Encounter (Signed)
Patient called requesting prescription refill of Folic Acid to be sent to [facility] at [address] in [place].

## 2022-04-03 NOTE — Telephone Encounter (Signed)
Next Visit: 07/28/2022  Last Visit: 03/24/2022  Last Fill: 12/15/2021  DX: Uveitis  Current Dose per office note 03/24/2022: methotrexate 8 tablets p.o. weekly   Labs: 12/22/2021 CBC WNL.  Glucose is 128.  Calcium is borderline low. Total protein is borderline low.  We will continue to monitor.   Patient to update labs at appointment for Visco on 04/06/2022.   Okay to refill MTX?

## 2022-04-06 ENCOUNTER — Ambulatory Visit (HOSPITAL_COMMUNITY)
Admission: RE | Admit: 2022-04-06 | Discharge: 2022-04-06 | Disposition: A | Discharge status: Home / Self Care | Payer: Medicare Other | Source: Ambulatory Visit | Attending: Rheumatology | Admitting: Rheumatology

## 2022-04-06 ENCOUNTER — Ambulatory Visit (INDEPENDENT_AMBULATORY_CARE_PROVIDER_SITE_OTHER): Payer: Medicare Other | Admitting: Physician Assistant

## 2022-04-06 ENCOUNTER — Other Ambulatory Visit: Payer: Self-pay | Admitting: Pharmacist

## 2022-04-06 DIAGNOSIS — D869 Sarcoidosis, unspecified: Secondary | ICD-10-CM | POA: Diagnosis not present

## 2022-04-06 DIAGNOSIS — H209 Unspecified iridocyclitis: Secondary | ICD-10-CM

## 2022-04-06 DIAGNOSIS — M170 Bilateral primary osteoarthritis of knee: Secondary | ICD-10-CM

## 2022-04-06 DIAGNOSIS — Z79899 Other long term (current) drug therapy: Secondary | ICD-10-CM | POA: Insufficient documentation

## 2022-04-06 DIAGNOSIS — Z111 Encounter for screening for respiratory tuberculosis: Secondary | ICD-10-CM

## 2022-04-06 LAB — CBC WITH DIFFERENTIAL/PLATELET
Abs Immature Granulocytes: 0.01 10*3/uL (ref 0.00–0.07)
Basophils Absolute: 0 10*3/uL (ref 0.0–0.1)
Basophils Relative: 0 %
Eosinophils Absolute: 0.1 10*3/uL (ref 0.0–0.5)
Eosinophils Relative: 2 %
HCT: 40.7 % (ref 36.0–46.0)
Hemoglobin: 13.7 g/dL (ref 12.0–15.0)
Immature Granulocytes: 0 %
Lymphocytes Relative: 44 %
Lymphs Abs: 3.4 10*3/uL (ref 0.7–4.0)
MCH: 31 pg (ref 26.0–34.0)
MCHC: 33.7 g/dL (ref 30.0–36.0)
MCV: 92.1 fL (ref 80.0–100.0)
Monocytes Absolute: 0.7 10*3/uL (ref 0.1–1.0)
Monocytes Relative: 9 %
Neutro Abs: 3.5 10*3/uL (ref 1.7–7.7)
Neutrophils Relative %: 45 %
Platelets: 284 10*3/uL (ref 150–400)
RBC: 4.42 MIL/uL (ref 3.87–5.11)
RDW: 13.2 % (ref 11.5–15.5)
WBC: 7.7 10*3/uL (ref 4.0–10.5)
nRBC: 0 % (ref 0.0–0.2)

## 2022-04-06 LAB — COMPREHENSIVE METABOLIC PANEL
ALT: 16 U/L (ref 0–44)
AST: 33 U/L (ref 15–41)
Albumin: 4 g/dL (ref 3.5–5.0)
Alkaline Phosphatase: 69 U/L (ref 38–126)
Anion gap: 7 (ref 5–15)
BUN: 15 mg/dL (ref 8–23)
CO2: 25 mmol/L (ref 22–32)
Calcium: 9.3 mg/dL (ref 8.9–10.3)
Chloride: 108 mmol/L (ref 98–111)
Creatinine, Ser: 0.88 mg/dL (ref 0.44–1.00)
GFR, Estimated: >60 mL/min (ref >60)
Glucose, Bld: 113 mg/dL — ABNORMAL HIGH (ref 70–99)
Potassium: 4.9 mmol/L (ref 3.5–5.1)
Sodium: 140 mmol/L (ref 135–145)
Total Bilirubin: 0.6 mg/dL (ref 0.3–1.2)
Total Protein: 6.3 g/dL — ABNORMAL LOW (ref 6.5–8.1)

## 2022-04-06 MED ORDER — SODIUM HYALURONATE (VISCOSUP) 20 MG/2ML IX SOSY
20.0000 mg | PREFILLED_SYRINGE | INTRA_ARTICULAR | Status: AC | PRN
  Administered 2022-04-06: 20 mg via INTRA_ARTICULAR

## 2022-04-06 MED ORDER — DIPHENHYDRAMINE HCL 25 MG PO CAPS: 25.0000 mg | ORAL_CAPSULE | ORAL | Status: DC

## 2022-04-06 MED ORDER — ACETAMINOPHEN 325 MG PO TABS: 650.0000 mg | ORAL_TABLET | ORAL | Status: DC

## 2022-04-06 MED ORDER — LIDOCAINE HCL 1 % IJ SOLN
1.5000 mL | INTRAMUSCULAR | Status: AC | PRN
  Administered 2022-04-06: 1.5 mL

## 2022-04-06 MED ORDER — SODIUM CHLORIDE 0.9 % IV SOLN
3.0000 mg/kg | INTRAVENOUS | Status: DC
  Administered 2022-04-06: 200 mg via INTRAVENOUS
  Filled 2022-04-06: qty 20

## 2022-04-06 NOTE — Progress Notes (Signed)
CBC WNL.   Glucose is 113.  Total protein is borderline low-6.3. rest of CMP WNL.  We will continue to monitor.

## 2022-04-06 NOTE — Progress Notes (Signed)
Next infusion scheduled for Remicade on 05/18/22 and due for updated orders. Diagnosis: uveitis, sarcoidosis. Also takes methotrexate 20mg  once weekly  Dose: 3mg /kg every 6 weeks (200mg  based on last recorded weight of 59.5kg)  Last Clinic Visit: 03/24/22 Next Clinic Visit: 07/28/22  Last infusion: 04/06/22  Labs: CBC and CMP on 04/06/22 - CBC wnl, CMP wnl TB Gold: negative on 06/24/21   Orders placed for Remicade x 2 doses along with premedication of acetaminophen and diphenhydramine to be administered 30 minutes before medication infusion.  Standing CBC with diff/platelet and CMP with GFR orders placed to be drawn every 2 months.  Next TB gold due 06/24/22. Order placed to be drawn in September 2023  08/24/21, PharmD, MPH, BCPS, CPP Clinical Pharmacist (Rheumatology and Pulmonology)

## 2022-04-06 NOTE — Progress Notes (Signed)
   Procedure Note  Patient: Malene Blaydes             Date of Birth: May 14, 1951           MRN: [id number]             Visit Date: 04/06/2022  Procedures: Visit Diagnoses:  1. Primary osteoarthritis of both knees    Hyalgan #3 Bilateral knee joint injections  Large Joint Inj: bilateral knee on 04/06/2022 1:35 PM Indications: pain Details: 27 G 1.5 in needle, medial approach  Arthrogram: No  Medications (Right): 1.5 mL lidocaine 1 %; 20 mg Sodium Hyaluronate 20 MG/2ML Aspirate (Right): 0 mL Medications (Left): 1.5 mL lidocaine 1 %; 20 mg Sodium Hyaluronate 20 MG/2ML Aspirate (Left): 0 mL Outcome: tolerated well, no immediate complications Procedure, treatment alternatives, risks and benefits explained, specific risks discussed. Consent was given by the patient. Immediately prior to procedure a time out was called to verify the correct patient, procedure, equipment, support staff and site/side marked as required. Patient was prepped and draped in the usual sterile fashion.     Patient tolerated the procedure well.  Aftercare was discussed.  Sherron Ales, PA-C

## 2022-04-07 DIAGNOSIS — M5412 Radiculopathy, cervical region: Secondary | ICD-10-CM | POA: Diagnosis not present

## 2022-04-07 DIAGNOSIS — M5416 Radiculopathy, lumbar region: Secondary | ICD-10-CM | POA: Diagnosis not present

## 2022-04-07 DIAGNOSIS — M25511 Pain in right shoulder: Secondary | ICD-10-CM | POA: Diagnosis not present

## 2022-04-10 ENCOUNTER — Ambulatory Visit (INDEPENDENT_AMBULATORY_CARE_PROVIDER_SITE_OTHER): Payer: Medicare Other

## 2022-04-10 VITALS — Wt 132.0 lb

## 2022-04-10 DIAGNOSIS — Z0000 Encounter for general adult medical examination without abnormal findings: Secondary | ICD-10-CM

## 2022-04-13 ENCOUNTER — Ambulatory Visit (INDEPENDENT_AMBULATORY_CARE_PROVIDER_SITE_OTHER): Payer: Medicare Other | Admitting: Physician Assistant

## 2022-04-13 ENCOUNTER — Encounter: Payer: Self-pay | Admitting: Physician Assistant

## 2022-04-13 DIAGNOSIS — M170 Bilateral primary osteoarthritis of knee: Secondary | ICD-10-CM | POA: Diagnosis not present

## 2022-04-13 MED ORDER — SODIUM HYALURONATE (VISCOSUP) 20 MG/2ML IX SOSY
20.0000 mg | PREFILLED_SYRINGE | INTRA_ARTICULAR | Status: AC | PRN
  Administered 2022-04-13: 20 mg via INTRA_ARTICULAR

## 2022-04-13 MED ORDER — LIDOCAINE HCL 1 % IJ SOLN
1.5000 mL | INTRAMUSCULAR | Status: AC | PRN
  Administered 2022-04-13: 1.5 mL

## 2022-04-17 ENCOUNTER — Other Ambulatory Visit: Payer: Self-pay | Admitting: Primary Care

## 2022-04-17 DIAGNOSIS — E785 Hyperlipidemia, unspecified: Secondary | ICD-10-CM

## 2022-04-22 ENCOUNTER — Ambulatory Visit (INDEPENDENT_AMBULATORY_CARE_PROVIDER_SITE_OTHER): Payer: Medicare Other | Admitting: Physician Assistant

## 2022-04-22 ENCOUNTER — Other Ambulatory Visit: Payer: Self-pay | Admitting: Primary Care

## 2022-04-22 DIAGNOSIS — M170 Bilateral primary osteoarthritis of knee: Secondary | ICD-10-CM

## 2022-04-22 DIAGNOSIS — M542 Cervicalgia: Secondary | ICD-10-CM | POA: Diagnosis not present

## 2022-04-22 DIAGNOSIS — R202 Paresthesia of skin: Secondary | ICD-10-CM | POA: Diagnosis not present

## 2022-04-22 DIAGNOSIS — I10 Essential (primary) hypertension: Secondary | ICD-10-CM

## 2022-04-22 DIAGNOSIS — M5412 Radiculopathy, cervical region: Secondary | ICD-10-CM | POA: Diagnosis not present

## 2022-04-22 DIAGNOSIS — M545 Low back pain, unspecified: Secondary | ICD-10-CM | POA: Diagnosis not present

## 2022-04-22 DIAGNOSIS — M5416 Radiculopathy, lumbar region: Secondary | ICD-10-CM | POA: Diagnosis not present

## 2022-04-22 DIAGNOSIS — R200 Anesthesia of skin: Secondary | ICD-10-CM | POA: Diagnosis not present

## 2022-04-22 MED ORDER — SODIUM HYALURONATE (VISCOSUP) 20 MG/2ML IX SOSY
20.0000 mg | PREFILLED_SYRINGE | INTRA_ARTICULAR | Status: AC | PRN
  Administered 2022-04-22: 20 mg via INTRA_ARTICULAR

## 2022-04-22 MED ORDER — LIDOCAINE HCL 1 % IJ SOLN
1.5000 mL | INTRAMUSCULAR | Status: AC | PRN
  Administered 2022-04-22: 1.5 mL

## 2022-04-22 NOTE — Progress Notes (Signed)
   Procedure Note  Patient: Renee Jones             Date of Birth: 16-Nov-1950           MRN: [id number]             Visit Date: 04/22/2022  Procedures: Visit Diagnoses:  1. Primary osteoarthritis of both knees    Hyalgan #5 bilateral knees, B/B Large Joint Inj: bilateral knee on 04/22/2022 1:16 PM Indications: pain Details: 27 G 1.5 in needle, medial approach  Arthrogram: No  Medications (Right): 1.5 mL lidocaine 1 %; 20 mg Sodium Hyaluronate (Viscosup) 20 MG/2ML Aspirate (Right): 0 mL Medications (Left): 1.5 mL lidocaine 1 %; 20 mg Sodium Hyaluronate (Viscosup) 20 MG/2ML Aspirate (Left): 0 mL Outcome: tolerated well, no immediate complications Procedure, treatment alternatives, risks and benefits explained, specific risks discussed. Consent was given by the patient. Immediately prior to procedure a time out was called to verify the correct patient, procedure, equipment, support staff and site/side marked as required. Patient was prepped and draped in the usual sterile fashion.     Patient tolerated the procedure well.  Aftercare was discussed.  Sherron Ales, PA-C

## 2022-04-27 ENCOUNTER — Ambulatory Visit (HOSPITAL_COMMUNITY): Payer: Medicare Other

## 2022-04-28 ENCOUNTER — Ambulatory Visit: Payer: Medicare Other | Admitting: Primary Care

## 2022-05-04 DIAGNOSIS — M5416 Radiculopathy, lumbar region: Secondary | ICD-10-CM | POA: Diagnosis not present

## 2022-05-04 DIAGNOSIS — M5412 Radiculopathy, cervical region: Secondary | ICD-10-CM | POA: Diagnosis not present

## 2022-05-18 ENCOUNTER — Encounter (HOSPITAL_COMMUNITY)
Admission: RE | Admit: 2022-05-18 | Discharge: 2022-05-18 | Disposition: A | Discharge status: Home / Self Care | Payer: Medicare Other | Source: Ambulatory Visit | Attending: Rheumatology | Admitting: Rheumatology

## 2022-05-18 DIAGNOSIS — H209 Unspecified iridocyclitis: Secondary | ICD-10-CM | POA: Insufficient documentation

## 2022-05-18 DIAGNOSIS — Z79899 Other long term (current) drug therapy: Secondary | ICD-10-CM | POA: Diagnosis not present

## 2022-05-18 DIAGNOSIS — D869 Sarcoidosis, unspecified: Secondary | ICD-10-CM | POA: Insufficient documentation

## 2022-05-18 MED ORDER — SODIUM CHLORIDE 0.9 % IV SOLN
3.0000 mg/kg | INTRAVENOUS | Status: DC
  Administered 2022-05-18: 200 mg via INTRAVENOUS
  Filled 2022-05-18: qty 20

## 2022-05-18 MED ORDER — DIPHENHYDRAMINE HCL 25 MG PO CAPS: 25.0000 mg | ORAL_CAPSULE | ORAL | Status: DC

## 2022-05-18 MED ORDER — ACETAMINOPHEN 325 MG PO TABS: 650.0000 mg | ORAL_TABLET | ORAL | Status: DC

## 2022-05-22 ENCOUNTER — Ambulatory Visit (INDEPENDENT_AMBULATORY_CARE_PROVIDER_SITE_OTHER): Payer: Medicare Other | Admitting: Primary Care

## 2022-05-22 ENCOUNTER — Encounter: Payer: Self-pay | Admitting: Primary Care

## 2022-05-22 VITALS — BP 110/82 | HR 68 | Temp 97.6°F | Ht 63.0 in | Wt 134.0 lb

## 2022-05-22 DIAGNOSIS — K219 Gastro-esophageal reflux disease without esophagitis: Secondary | ICD-10-CM | POA: Diagnosis not present

## 2022-05-22 DIAGNOSIS — M47812 Spondylosis without myelopathy or radiculopathy, cervical region: Secondary | ICD-10-CM | POA: Diagnosis not present

## 2022-05-22 DIAGNOSIS — E785 Hyperlipidemia, unspecified: Secondary | ICD-10-CM

## 2022-05-22 DIAGNOSIS — M170 Bilateral primary osteoarthritis of knee: Secondary | ICD-10-CM

## 2022-05-22 DIAGNOSIS — M5136 Other intervertebral disc degeneration, lumbar region: Secondary | ICD-10-CM | POA: Diagnosis not present

## 2022-05-22 DIAGNOSIS — R7303 Prediabetes: Secondary | ICD-10-CM | POA: Diagnosis not present

## 2022-05-22 DIAGNOSIS — I10 Essential (primary) hypertension: Secondary | ICD-10-CM

## 2022-05-22 DIAGNOSIS — D869 Sarcoidosis, unspecified: Secondary | ICD-10-CM | POA: Diagnosis not present

## 2022-05-22 DIAGNOSIS — M51369 Other intervertebral disc degeneration, lumbar region without mention of lumbar back pain or lower extremity pain: Secondary | ICD-10-CM

## 2022-05-22 DIAGNOSIS — M19041 Primary osteoarthritis, right hand: Secondary | ICD-10-CM

## 2022-05-22 DIAGNOSIS — M19042 Primary osteoarthritis, left hand: Secondary | ICD-10-CM | POA: Diagnosis not present

## 2022-05-22 LAB — LIPID PANEL
Cholesterol: 152 mg/dL (ref 0–200)
HDL: 51.6 mg/dL (ref >39.00)
LDL Cholesterol: 74 mg/dL (ref 0–99)
NonHDL: 100.17
Total CHOL/HDL Ratio: 3
Triglycerides: 133 mg/dL (ref 0.0–149.0)
VLDL: 26.6 mg/dL (ref 0.0–40.0)

## 2022-05-22 LAB — HEMOGLOBIN A1C: Hgb A1c MFr Bld: 6.1 % (ref 4.6–6.5)

## 2022-05-22 MED ORDER — CYCLOBENZAPRINE HCL 5 MG PO TABS: 5.0000 mg | ORAL_TABLET | Freq: Every evening | ORAL | 0 refills | Status: DC | PRN

## 2022-05-22 NOTE — Progress Notes (Signed)
Subjective:    Patient ID: Renee Jones, female    DOB: October 26, 1950, 71 y.o.   MRN: [id number]  Hand Pain  Pertinent negatives include no chest pain.  Insomnia    Renee Jones is a very pleasant 71 y.o. female with a history of SVT, hypertension, pulmonary nodules, GERD, neuritis, degenerative disc disease of lumbar spine, sarcoidosis, glaucoma, prediabetes, hyperlipidemia who presents today for follow-up of chronic conditions and to discuss sleep disturbance and chronic hand pain.  She is due for colonoscopy, she received a call from her GI doctor today to schedule as she is due.  Mammogram completed in November 2022 and bone density scan in January 2022.   1) Osteoarthritis: Chronic and located to multiple sites including the hands, knees, cervical and lumbar spine.  Following with rheumatology and is managed on methotrexate 20 mg weekly, Remicade infusion every 6 weeks.  Last office visit from rheumatology was in June 2023.  During this visit no changes were made to her medication regimen.  Following with neurosurgery for chronic cervical and lumbar DDD, will resuming steroid injections soon.   Previously managed on cyclobenzaprine for joint aches and muscle spasms for which she took sparingly as needed. She is requesting a refill of her cyclobenzaprine today.   2) Essential Hypertension/SVT/Tachycardia: Currently managed on amlodipine 5 mg daily, metoprolol tartrate 50 mg twice daily. She denies chest pain, shortness of breath, dizziness, headaches.   Following with cardiology every 6-12 months.   BP Readings from Last 3 Encounters:  05/22/22 110/82  05/18/22 135/73  04/06/22 (!) 126/58   3) GERD: Currently managed on pantoprazole 40 mg daily. Overall feels well managed on this regimen. Due for repeat colonoscopy now, has been contacted by her GI provider.   4) Hyperlipidemia: Currently managed on pravastatin 40 mg daily.  She is due for repeat lipid panel  today.   Review of Systems  Respiratory:  Negative for shortness of breath.   Cardiovascular:  Negative for chest pain.  Musculoskeletal:  Positive for arthralgias and back pain.  Psychiatric/Behavioral:  The patient has insomnia.          Past Medical History:  Diagnosis Date   Allergy    Asthma    Bronchial spasm 09/14/2016   DDD (degenerative disc disease), lumbar 09/14/2016   Detached retina 09/14/2016   Diastolic dysfunction 08/28/2015   Essential hypertension 08/28/2015   GERD (gastroesophageal reflux disease)    Glaucoma 09/14/2016   Neuromuscular disorder (HCC)    Osteoarthritis of both hands 09/14/2016   Osteoarthritis of both knees 09/14/2016   Pure hypercholesterolemia 02/20/2021   Sarcoidosis    with +lymph node biopsy   SVT (supraventricular tachycardia) (HCC) 08/28/2015   Tachycardia    Uveitis     Social History   Socioeconomic History   Marital status: Widowed    Spouse name: Not on file   Number of children: Not on file   Years of education: Not on file   Highest education level: Not on file  Occupational History   Not on file  Tobacco Use   Smoking status: Never   Smokeless tobacco: Never  Vaping Use   Vaping Use: Never used  Substance and Sexual Activity   Alcohol use: No    Alcohol/week: 0.0 standard drinks of alcohol   Drug use: No   Sexual activity: Yes  Other Topics Concern   Not on file  Social History Narrative   Widower.   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  Moved from [place].   Social Determinants of Health   Financial Resource Strain: Low Risk  (04/10/2022)   Overall Financial Resource Strain (CARDIA)    Difficulty of Paying Living Expenses: Not hard at all  Food Insecurity: Food Insecurity Present (04/10/2022)   Hunger Vital Sign    Worried About Running Out of Food in the Last Year: Often true    Ran Out of Food in the Last Year: Often true  Transportation Needs: No Transportation Needs (04/10/2022)   PRAPARE -  Administrator, Civil Service (Medical): No    Lack of Transportation (Non-Medical): No  Physical Activity: Insufficiently Active (04/10/2022)   Exercise Vital Sign    Days of Exercise per Week: 3 days    Minutes of Exercise per Session: 30 min  Stress: No Stress Concern Present (04/10/2022)   [employer] of Occupational Health - Occupational Stress Questionnaire    Feeling of Stress : Not at all  Social Connections: Moderately Isolated (04/10/2022)   Social Connection and Isolation Panel [NHANES]    Frequency of Communication with Friends and Family: More than three times a week    Frequency of Social Gatherings with Friends and Family: More than three times a week    Attends Religious Services: More than 4 times per year    Active Member of [employer] or Organizations: No    Attends Banker Meetings: Never    Marital Status: Widowed  Intimate Partner Violence: Not At Risk (04/10/2022)   Humiliation, Afraid, Rape, and Kick questionnaire    Fear of Current or Ex-Partner: No    Emotionally Abused: No    Physically Abused: No    Sexually Abused: No    Past Surgical History:  Procedure Laterality Date   CATARACT EXTRACTION, BILATERAL  2018   CYSTECTOMY     EYE SURGERY Bilateral 2018   cataract removal    LUMBAR SPINE SURGERY  2009   ROTATOR CUFF REPAIR Right 2011   ROTATOR CUFF REPAIR Left 06/2018   TONSILLECTOMY AND ADENOIDECTOMY  1962   TOTAL ABDOMINAL HYSTERECTOMY  1984    Family History  Adopted: Yes  Problem Relation Age of Onset   Scleroderma Daughter     Allergies  Allergen Reactions   Codeine Itching and Hives    Other reaction(s): itching internally   Amoxicillin-Pot Clavulanate Diarrhea   Erythromycin Base    Other     Other reaction(s): Unknown Other reaction(s): itching internally   Sulfa Antibiotics Itching     vomiting   Tramadol Itching   Tramadol Hcl     Other reaction(s): hives   Erythromycin Itching and Hives    Other  reaction(s): itching internally    Current Outpatient Medications on File Prior to Visit  Medication Sig Dispense Refill   albuterol (VENTOLIN HFA) 108 (90 Base) MCG/ACT inhaler Inhale 1-2 puffs into the lungs every 6 (six) hours as needed (cough). 6.7 g 0   amLODipine (NORVASC) 5 MG tablet Take 1 tablet (5 mg total) by mouth daily. for blood pressure. Office visit required for further refills. 90 tablet 0   cholecalciferol (VITAMIN D) 1000 units tablet Take 1,000 Units by mouth daily.     diclofenac Sodium (VOLTAREN) 1 % GEL Apply 3 g topically 3 (three) times daily as needed.     fluticasone (FLONASE) 50 MCG/ACT nasal spray Place 2 sprays into both nostrils daily as needed for allergies or rhinitis.     folic acid (FOLVITE) 1 MG tablet Take  2 tablets (2 mg total) by mouth daily. 180 tablet 3   InFLIXimab (REMICADE IV) Inject into the vein every 6 (six) weeks.     methotrexate 2.5 MG tablet TAKE 8 TABLETS BY MOUTH ONCE A WEEK 96 tablet 0   metoprolol tartrate (LOPRESSOR) 50 MG tablet TAKE 1 AND 1/2 TABLETS BY MOUTH TWICE DAILY 270 tablet 3   ondansetron (ZOFRAN) 4 MG tablet TAKE 1 TABLET BY MOUTH EVERY 6 HOURS AS NEEDED FOR NAUSEA OR VOMITING 30 tablet 0   pantoprazole (PROTONIX) 40 MG tablet TAKE 1 TABLET BY MOUTH ONCE DAILY 90 tablet 1   pravastatin (PRAVACHOL) 40 MG tablet Take 1 tablet (40 mg total) by mouth daily. for cholesterol. Office visit required for further refills. 90 tablet 0   SYSTANE ULTRA 0.4-0.3 % SOLN SMARTSIG:1 Drop(s) In Eye(s) 6 Times Daily     No current facility-administered medications on file prior to visit.    BP 110/82   Pulse 68   Temp 97.6 F (36.4 C) (Oral)   Ht 5\' 3"  (1.6 m)   Wt 134 lb (60.8 kg)   SpO2 99%   BMI 23.74 kg/m  Objective:   Physical Exam Cardiovascular:     Rate and Rhythm: Normal rate and regular rhythm.  Pulmonary:     Effort: Pulmonary effort is normal.     Breath sounds: Normal breath sounds.  Abdominal:     General: Bowel  sounds are normal.     Palpations: Abdomen is soft.     Tenderness: There is no abdominal tenderness.  Musculoskeletal:     Cervical back: Neck supple.  Skin:    General: Skin is warm and dry.  Neurological:     Mental Status: She is alert and oriented to person, place, and time.  Psychiatric:        Mood and Affect: Mood normal.           Assessment & Plan:   Problem List Items Addressed This Visit       Cardiovascular and Mediastinum   Essential hypertension    Controlled.  Continue amlodipine 5 mg daily.         Digestive   GERD (gastroesophageal reflux disease)    Controlled.  Continue pantoprazole 40 mg daily. Follows with GI.        Musculoskeletal and Integument   Osteoarthritis of both hands    Following with rheumatology, office notes from June 2023 reviewed.  Continue methotrexate 20 mg weekly, Remicade infusions every 6 weeks. Reviewed CMP and CBC from June 2023.        Relevant Medications   cyclobenzaprine (FLEXERIL) 5 MG tablet   Osteoarthritis of both knees    Following with rheumatology, office notes from June 2023 reviewed.  Continue methotrexate 20 mg weekly, Remicade infusions every 6 weeks. Reviewed CMP and CBC from June 2023.      Relevant Medications   cyclobenzaprine (FLEXERIL) 5 MG tablet   DDD (degenerative disc disease), lumbar    Following with neurosurgery now, continue steroid injections.      Relevant Medications   cyclobenzaprine (FLEXERIL) 5 MG tablet   Cervical spondylosis    Now follow with neurosurgery.  Continue steroid injections.      Relevant Medications   cyclobenzaprine (FLEXERIL) 5 MG tablet     Other   Sarcoidosis    Following with rheumatology, office notes reviewed from June 2023.  Continue Remicade infusions every 6 weeks, continue methotrexate 20 mg weekly  Prediabetes - Primary    Commended her on regular exercise and healthy diet. Repeat A1c pending.      Relevant Orders    Hemoglobin A1c   Hyperlipidemia    Continue pravastatin 40 mg daily. Repeat lipid panel pending      Relevant Orders   Lipid panel       Doreene Nest, NP

## 2022-05-22 NOTE — Patient Instructions (Signed)
Stop by the lab prior to leaving today. I will notify you of your results once received.   Follow up with GI for your colonoscopy.   You may take cyclobenzaprine at bedtime as needed for muscle spasms.  It was a pleasure to see you today!

## 2022-05-22 NOTE — Assessment & Plan Note (Signed)
Commended her on regular exercise and healthy diet. Repeat A1c pending.

## 2022-05-22 NOTE — Assessment & Plan Note (Signed)
Continue pravastatin 40 mg daily. Repeat lipid panel pending 

## 2022-05-22 NOTE — Assessment & Plan Note (Signed)
Following with rheumatology, office notes from June 2023 reviewed.  Continue methotrexate 20 mg weekly, Remicade infusions every 6 weeks. Reviewed CMP and CBC from June 2023.

## 2022-05-22 NOTE — Assessment & Plan Note (Signed)
Now follow with neurosurgery.  Continue steroid injections.

## 2022-05-22 NOTE — Assessment & Plan Note (Signed)
Controlled.  Continue pantoprazole 40 mg daily. Follows with GI. 

## 2022-05-22 NOTE — Assessment & Plan Note (Signed)
Following with neurosurgery now, continue steroid injections.

## 2022-05-22 NOTE — Assessment & Plan Note (Signed)
Controlled.  Continue amlodipine 5 mg daily. 

## 2022-05-22 NOTE — Assessment & Plan Note (Signed)
Following with rheumatology, office notes from June 2023 reviewed.  Continue methotrexate 20 mg weekly, Remicade infusions every 6 weeks. Reviewed CMP and CBC from June 2023.   

## 2022-05-22 NOTE — Assessment & Plan Note (Signed)
Following with rheumatology, office notes reviewed from June 2023.  Continue Remicade infusions every 6 weeks, continue methotrexate 20 mg weekly

## 2022-05-25 ENCOUNTER — Encounter (HOSPITAL_BASED_OUTPATIENT_CLINIC_OR_DEPARTMENT_OTHER): Payer: Self-pay | Admitting: Cardiovascular Disease

## 2022-05-25 ENCOUNTER — Ambulatory Visit (INDEPENDENT_AMBULATORY_CARE_PROVIDER_SITE_OTHER): Payer: Medicare Other | Admitting: Cardiovascular Disease

## 2022-05-25 DIAGNOSIS — I471 Supraventricular tachycardia: Secondary | ICD-10-CM

## 2022-05-25 DIAGNOSIS — I10 Essential (primary) hypertension: Secondary | ICD-10-CM | POA: Diagnosis not present

## 2022-05-25 DIAGNOSIS — E7800 Pure hypercholesterolemia, unspecified: Secondary | ICD-10-CM | POA: Diagnosis not present

## 2022-05-25 NOTE — Patient Instructions (Signed)
Medication Instructions:  ?Your physician recommends that you continue on your current medications as directed. Please refer to the Current Medication list given to you today.  ? ?*If you need a refill on your cardiac medications before your next appointment, please call your pharmacy* ? ?Lab Work: ?NONE ? ?Testing/Procedures: ?NONE ? ?Follow-Up: ?At [facility], you and your health needs are our priority.  As part of our continuing mission to provide you with exceptional heart care, we have created designated Provider Care Teams.  These Care Teams include your primary Cardiologist (physician) and Advanced Practice Providers (APPs -  Physician Assistants and Nurse Practitioners) who all work together to provide you with the care you need, when you need it. ? ?We recommend signing up for the patient portal called "MyChart".  Sign up information is provided on this After Visit Summary.  MyChart is used to connect with patients for Virtual Visits (Telemedicine).  Patients are able to view lab/test results, encounter notes, upcoming appointments, etc.  Non-urgent messages can be sent to your provider as well.   ?To learn more about what you can do with MyChart, go to [email or web address].   ? ?Your next appointment:   ?12 month(s) ? ?The format for your next appointment:   ?In Person ? ?Provider:   ?Tiffany Vona, MD  ? ? ? ? ? ? ?

## 2022-05-25 NOTE — Assessment & Plan Note (Signed)
Lipids are well-controlled on pravastatin.  She was encouraged to keep up the regular exercise.

## 2022-05-25 NOTE — Assessment & Plan Note (Signed)
Blood pressure is very well-controlled.  She was encouraged to keep up her regular exercise and diet.  Continue amlodipine and metoprolol.

## 2022-05-25 NOTE — Progress Notes (Signed)
Cardiology Office Note   Date:  05/25/2022   ID:  Renee Jones, DOB 02/18/1951, MRN [id number]  PCP:  Renee Nest, NP  Cardiologist:   Renee Si, MD  EP: Renee Brooklyn, MD   No chief complaint on file.   Jones ID:   Renee Jones is a 71 y.o. female paroxysmal SVT, HTN, GERD, and sarcoidosis.    History of Present Illness:  Renee Jones reports episodes of SVT that have been occurring for as long as she can remember.  She previously took a beta blocker which helped her symptoms but stopped taking it 20 years ago because she didn't think she needed it anymore.  Renee Jones was previously a Jones of Dr. Michaelle Jones and had a Holter monitor documenting SVT.  Renee Jones was evaluated in Rene[facility] ED 02/2015 with elevated BP to 197/120.   She had a cardiac MRI 10/2012 that did not reveal any delayed enhancement concerning for cardiac involvement of her sarcoid.  She underwent CPX testing to evaluate shortness of breath and it was suggestive of diastolic dysfunction.  She was started on metoprolol for SVT and it also helped her shortness of breath.  She was referred for an echo 08/2015 that revealed LVEF 55-60% with grade 1 diastolic dysfunction. She saw Dr. Elberta Jones and has not been interested in ablation. Metoprolol was increased with an improvement in her symptoms.  Renee Jones reported increased episodes of palpitations.  However she misunderstood that her PCP recommended she stop hydrochlorothiazide and actually stopped Renee metoprolol instead.  It was recommended that she resume metoprolol.  Since then she is feeling much better.  She has very rare episodes of palpitations.  It happened once or twice in Renee last month and lasted less than a minute.  She is walking for exercise for 30 minutes daily.  She has no exertional symptoms or edema.  She checks her blood pressure and it is been consistently in Renee 110s to 120s and under 80 diastolic.  She had her knees injected and  is feeling well.  She is also doing intermittent fasting to lose Renee weight she gained during COVID.  She is very hesitant about getting Renee Covid vaccine.  Renee Jones's husband died in January 27, 2018.  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  Lately she has been feeling well.  She recently went on a trip to [place] and out walked all of her children.  She has not had any exertional chest pain or shortness of breath.  She has been walking regularly for exercise and feels well.  Her blood pressure has been well-controlled.  She has been tolerating pravastatin and is very pleased that her cholesterol improved significantly.  She also saw improvement in her hemoglobin A1c.  She has not had any palpitations.  She denies lower extremity edema, orthopnea, or PND.  Her blood pressure has been very well-controlled.    Past Medical History:  Diagnosis Date   Allergy    Asthma    Bronchial spasm 09/14/2016   DDD (degenerative disc disease), lumbar 09/14/2016   Detached retina 09/14/2016   Diastolic dysfunction 08/28/2015   Essential hypertension 08/28/2015   GERD (gastroesophageal reflux disease)    Glaucoma 09/14/2016   Neuromuscular disorder (HCC)    Osteoarthritis of both hands 09/14/2016   Osteoarthritis of both knees 09/14/2016   Pure hypercholesterolemia 02/20/2021   Sarcoidosis  with +lymph node biopsy   SVT (supraventricular tachycardia) (HCC) 08/28/2015   Tachycardia    Uveitis     Past Surgical History:  Procedure Laterality Date   CATARACT EXTRACTION, BILATERAL  2018   CYSTECTOMY     EYE SURGERY Bilateral 2018   cataract removal    LUMBAR SPINE SURGERY  2009   ROTATOR CUFF REPAIR Right 2011   ROTATOR CUFF REPAIR Left 06/2018   TONSILLECTOMY AND ADENOIDECTOMY  1962   TOTAL ABDOMINAL HYSTERECTOMY  1984     Current Outpatient Medications  Medication Sig Dispense Refill   albuterol (VENTOLIN HFA)  108 (90 Base) MCG/ACT inhaler Inhale 1-2 puffs into Renee lungs every 6 (six) hours as needed (cough). 6.7 g 0   amLODipine (NORVASC) 5 MG tablet Take 1 tablet (5 mg total) by mouth daily. for blood pressure. Office visit required for further refills. 90 tablet 0   cholecalciferol (VITAMIN D) 1000 units tablet Take 1,000 Units by mouth daily.     cyclobenzaprine (FLEXERIL) 5 MG tablet Take 1 tablet (5 mg total) by mouth at bedtime as needed for muscle spasms. 30 tablet 0   diclofenac Sodium (VOLTAREN) 1 % GEL Apply 3 g topically 3 (three) times daily as needed.     fluticasone (FLONASE) 50 MCG/ACT nasal spray Place 2 sprays into both nostrils daily as needed for allergies or rhinitis.     folic acid (FOLVITE) 1 MG tablet Take 2 tablets (2 mg total) by mouth daily. 180 tablet 3   InFLIXimab (REMICADE IV) Inject into Renee vein every 6 (six) weeks.     methotrexate 2.5 MG tablet TAKE 8 TABLETS BY MOUTH ONCE A WEEK 96 tablet 0   metoprolol tartrate (LOPRESSOR) 50 MG tablet TAKE 1 AND 1/2 TABLETS BY MOUTH TWICE DAILY 270 tablet 3   ondansetron (ZOFRAN) 4 MG tablet TAKE 1 TABLET BY MOUTH EVERY 6 HOURS AS NEEDED FOR NAUSEA OR VOMITING 30 tablet 0   pantoprazole (PROTONIX) 40 MG tablet TAKE 1 TABLET BY MOUTH ONCE DAILY 90 tablet 1   pravastatin (PRAVACHOL) 40 MG tablet Take 1 tablet (40 mg total) by mouth daily. for cholesterol. Office visit required for further refills. 90 tablet 0   SYSTANE ULTRA 0.4-0.3 % SOLN SMARTSIG:1 Drop(s) In Eye(s) 6 Times Daily     No current facility-administered medications for this visit.    Allergies:   Codeine, Amoxicillin-pot clavulanate, Erythromycin base, Other, Sulfa antibiotics, Tramadol, Tramadol hcl, and Erythromycin    Social History:  Renee Jones  reports that she has never smoked. She has never used smokeless tobacco. She reports that she does not drink alcohol and does not use drugs.   Family History:  Renee Jones's family history includes Scleroderma in her  daughter. She was adopted.    ROS:  Please see Renee history of present illness.   Otherwise, review of systems are positive for none.   All other systems are reviewed and negative.    PHYSICAL EXAM: VS:  BP 126/72   Pulse 69   Ht 5\' 3"  (1.6 m)   Wt 135 lb 11.2 oz (61.6 kg)   SpO2 97%   BMI 24.04 kg/m  , BMI Body mass index is 24.04 kg/m. GENERAL:  Well appearing HEENT: Pupils equal round and reactive, fundi not visualized, oral mucosa unremarkable NECK:  No jugular venous distention, waveform within normal limits, carotid upstroke brisk and symmetric, no bruits LUNGS:  Clear to auscultation bilaterally HEART:  RRR.  PMI not displaced or sustained,S1  and S2 within normal limits, no S3, no S4, no clicks, no rubs, no murmurs ABD:  Flat, positive bowel sounds normal in frequency in pitch, no bruits, no rebound, no guarding, no midline pulsatile mass, no hepatomegaly, no splenomegaly EXT:  2 plus pulses throughout, no edema, no cyanosis no clubbing SKIN:  No rashes no nodules NEURO:  Cranial nerves II through XII grossly intact, motor grossly intact throughout PSYCH:  Cognitively intact, oriented to person place and time   EKG:  EKG is ordered today. 05/04/16:Sinus rhythm.  Rate 74 bpm.  Inferior TWI. 11/18/16: Sinus rhythm. Rate 72 bpm. We will monitor 11/30/19: Sinus rhythm.  85 bpm.  Nonspecific T wave abnormalities 02/20/2021: Sinus rhythm.  Rate 60 bpm.  Special 05/25/22: Sinus rhythm.  Rate 69 bpm.   Recent Labs: 04/06/2022: ALT 16; BUN 15; Creatinine, Ser 0.88; Hemoglobin 13.7; Platelets 284; Potassium 4.9; Sodium 140    Lipid Panel    Component Value Date/Time   CHOL 152 05/22/2022 1052   TRIG 133.0 05/22/2022 1052   HDL 51.60 05/22/2022 1052   CHOLHDL 3 05/22/2022 1052   VLDL 26.6 05/22/2022 1052   LDLCALC 74 05/22/2022 1052   LDLDIRECT 134.0 10/23/2020 1004      Wt Readings from Last 3 Encounters:  05/25/22 135 lb 11.2 oz (61.6 kg)  05/22/22 134 lb (60.8 kg)   04/10/22 132 lb (59.9 kg)      Other studies Reviewed: Additional studies/ records that were reviewed today include: PCP note  Review of Renee above records demonstrates:  Please see elsewhere in Renee note.     CPX 05/01/15: Conclusion: Exercise testing with gas-exchange demonstrates a mild functional impairment when compared to matched sedentary norms. There is no significant evidence of EIB. Renee combination of reduced PVO2, hypertensive BP, elevated VE/VCO2 slope, and flat O2 pulse suggests possible circulatory limitations (specifically diastolic dysfunction).   2014 CMRI 1. Normal LV size and systolic function, EF [id number].   2. Normal RV size and systolic function.   3. No myocardial delayed enhancement, so no definitive evidence for prior MI, infiltrative disease, or myocarditis.   4. No evidence on this exam for cardiac sarcoidosis.   ASSESSMENT AND PLAN:  Essential hypertension Blood pressure is very well-controlled.  She was encouraged to keep up her regular exercise and diet.  Continue amlodipine and metoprolol.  SVT (supraventricular tachycardia) (HCC) Very well-controlled on metoprolol.  Continue with regular exercise.  Hyperlipidemia Lipids are well-controlled on pravastatin.  She was encouraged to keep up Renee regular exercise.     Current medicines are reviewed at length with Renee Jones today.  Renee Jones does not have concerns regarding medicines.  Renee following changes have been made: None  Labs/ tests ordered today include: none.  Orders Placed This Encounter  Procedures   EKG 12-Lead     Disposition:   FU with Dr. Jonelle Sidle C. Oval Linsey in 1 year   Signed, Skeet Latch, MD  05/25/2022 5:06 PM    [place]

## 2022-05-25 NOTE — Assessment & Plan Note (Signed)
Very well-controlled on metoprolol.  Continue with regular exercise.

## 2022-06-04 DIAGNOSIS — M5416 Radiculopathy, lumbar region: Secondary | ICD-10-CM | POA: Diagnosis not present

## 2022-06-29 ENCOUNTER — Ambulatory Visit (HOSPITAL_COMMUNITY)
Admission: RE | Admit: 2022-06-29 | Discharge: 2022-06-29 | Disposition: A | Discharge status: Home / Self Care | Payer: Medicare Other | Source: Ambulatory Visit | Attending: Rheumatology | Admitting: Rheumatology

## 2022-06-29 ENCOUNTER — Other Ambulatory Visit: Payer: Self-pay | Admitting: Pharmacist

## 2022-06-29 DIAGNOSIS — Z111 Encounter for screening for respiratory tuberculosis: Secondary | ICD-10-CM | POA: Insufficient documentation

## 2022-06-29 DIAGNOSIS — Z79899 Other long term (current) drug therapy: Secondary | ICD-10-CM | POA: Diagnosis not present

## 2022-06-29 DIAGNOSIS — D869 Sarcoidosis, unspecified: Secondary | ICD-10-CM

## 2022-06-29 DIAGNOSIS — H209 Unspecified iridocyclitis: Secondary | ICD-10-CM | POA: Diagnosis not present

## 2022-06-29 LAB — CBC WITH DIFFERENTIAL/PLATELET
Abs Immature Granulocytes: 0.01 10*3/uL (ref 0.00–0.07)
Basophils Absolute: 0 10*3/uL (ref 0.0–0.1)
Basophils Relative: 0 %
Eosinophils Absolute: 0.1 10*3/uL (ref 0.0–0.5)
Eosinophils Relative: 1 %
HCT: 39.1 % (ref 36.0–46.0)
Hemoglobin: 13.3 g/dL (ref 12.0–15.0)
Immature Granulocytes: 0 %
Lymphocytes Relative: 44 %
Lymphs Abs: 3.1 10*3/uL (ref 0.7–4.0)
MCH: 31.3 pg (ref 26.0–34.0)
MCHC: 34 g/dL (ref 30.0–36.0)
MCV: 92 fL (ref 80.0–100.0)
Monocytes Absolute: 0.6 10*3/uL (ref 0.1–1.0)
Monocytes Relative: 8 %
Neutro Abs: 3.3 10*3/uL (ref 1.7–7.7)
Neutrophils Relative %: 47 %
Platelets: 326 10*3/uL (ref 150–400)
RBC: 4.25 MIL/uL (ref 3.87–5.11)
RDW: 13.1 % (ref 11.5–15.5)
WBC: 7.1 10*3/uL (ref 4.0–10.5)
nRBC: 0 % (ref 0.0–0.2)

## 2022-06-29 LAB — COMPREHENSIVE METABOLIC PANEL
ALT: 14 U/L (ref 0–44)
AST: 29 U/L (ref 15–41)
Albumin: 3.8 g/dL (ref 3.5–5.0)
Alkaline Phosphatase: 59 U/L (ref 38–126)
Anion gap: 8 (ref 5–15)
BUN: 15 mg/dL (ref 8–23)
CO2: 22 mmol/L (ref 22–32)
Calcium: 9.2 mg/dL (ref 8.9–10.3)
Chloride: 109 mmol/L (ref 98–111)
Creatinine, Ser: 0.86 mg/dL (ref 0.44–1.00)
GFR, Estimated: >60 mL/min (ref >60)
Glucose, Bld: 136 mg/dL — ABNORMAL HIGH (ref 70–99)
Potassium: 4.7 mmol/L (ref 3.5–5.1)
Sodium: 139 mmol/L (ref 135–145)
Total Bilirubin: 0.8 mg/dL (ref 0.3–1.2)
Total Protein: 6.3 g/dL — ABNORMAL LOW (ref 6.5–8.1)

## 2022-06-29 MED ORDER — ACETAMINOPHEN 325 MG PO TABS: 650.0000 mg | ORAL_TABLET | ORAL | Status: DC

## 2022-06-29 MED ORDER — DIPHENHYDRAMINE HCL 25 MG PO CAPS: 25.0000 mg | ORAL_CAPSULE | ORAL | Status: DC

## 2022-06-29 MED ORDER — SODIUM CHLORIDE 0.9 % IV SOLN
3.0000 mg/kg | INTRAVENOUS | Status: DC
  Administered 2022-06-29: 200 mg via INTRAVENOUS
  Filled 2022-06-29: qty 20

## 2022-06-29 NOTE — Progress Notes (Signed)
Next infusion scheduled for Remicade on 08/10/22 and due for updated orders. Diagnosis: uveitis, sarcoidosis. Also takes methotrexate 20mg  once weekly  Dose: 3mg /kg every 6 weeks (200mg  based on last recorded weight of 61.8kg)  Last Clinic Visit: 03/24/22 Next Clinic Visit: 07/28/22  Last infusion: 06/29/22  Labs: CBC and CMP on 06/29/22 - wnl TB Gold: negative on 06/29/22   Orders placed for Remicade x 2 doses along with premedication of acetaminophen and diphenhydramine to be administered 30 minutes before medication infusion.  Standing CBC with diff/platelet and CMP with GFR orders placed to be drawn every other infusion.  Next TB gold due 06/30/2023  08/29/22, PharmD, MPH, BCPS, CPP Clinical Pharmacist (Rheumatology and Pulmonology)

## 2022-06-29 NOTE — Progress Notes (Signed)
Glucose is 136. Total protein remains low but stable-6.3. Rest of CMP WNL.  CBC WNL.

## 2022-07-02 LAB — QUANTIFERON-TB GOLD PLUS (RQFGPL)
QuantiFERON Mitogen Value: >10 IU/mL
QuantiFERON Nil Value: 0.18 IU/mL
QuantiFERON TB1 Ag Value: 0.19 IU/mL
QuantiFERON TB2 Ag Value: 0.15 IU/mL

## 2022-07-02 LAB — QUANTIFERON-TB GOLD PLUS: QuantiFERON-TB Gold Plus: NEGATIVE

## 2022-07-02 NOTE — Progress Notes (Signed)
TB gold negative

## 2022-07-13 NOTE — Progress Notes (Unsigned)
Office Visit Note  Patient: Renee Jones             Date of Birth: Feb 21, 1951           MRN: [id number]             PCP: Doreene Nest, NP Referring: Doreene Nest, NP Visit Date: 07/15/2022 Occupation: @GUAROCC @  Subjective:  No chief complaint on file.   History of Present Illness: Renee Jones is a 71 y.o. female ***   Activities of Daily Living:  Patient reports morning stiffness for *** {minute/hour:19697}.   Patient {ACTIONS;DENIES/REPORTS:21021675::"Denies"} nocturnal pain.  Difficulty dressing/grooming: {ACTIONS;DENIES/REPORTS:21021675::"Denies"} Difficulty climbing stairs: {ACTIONS;DENIES/REPORTS:21021675::"Denies"} Difficulty getting out of chair: {ACTIONS;DENIES/REPORTS:21021675::"Denies"} Difficulty using hands for taps, buttons, cutlery, and/or writing: {ACTIONS;DENIES/REPORTS:21021675::"Denies"}  No Rheumatology ROS completed.   PMFS History:  Patient Active Problem List   Diagnosis Date Noted  . Cervical spondylosis 11/12/2021  . Hyperlipidemia 02/20/2021  . Prediabetes 12/21/2018  . High risk medications (not anticoagulants) long-term use 01/13/2017  . GERD (gastroesophageal reflux disease) 09/14/2016  . Bronchial spasm 09/14/2016  . Glaucoma 09/14/2016  . Detached retina 09/14/2016  . Osteoarthritis of both hands 09/14/2016  . Osteoarthritis of both knees 09/14/2016  . DDD (degenerative disc disease), lumbar 09/14/2016  . Uveitis 06/22/2016  . SVT (supraventricular tachycardia) (HCC) 08/28/2015  . Essential hypertension 08/28/2015  . Diastolic dysfunction 08/28/2015  . Sarcoidosis 09/30/2012  . Pulmonary nodules 09/30/2012  . Tachycardia 09/30/2012  . Sarcoid arthropathy 09/30/2012    Past Medical History:  Diagnosis Date  . Allergy   . Asthma   . Bronchial spasm 09/14/2016  . DDD (degenerative disc disease), lumbar 09/14/2016  . Detached retina 09/14/2016  . Diastolic dysfunction 08/28/2015  . Essential  hypertension 08/28/2015  . GERD (gastroesophageal reflux disease)   . Glaucoma 09/14/2016  . Neuromuscular disorder (HCC)   . Osteoarthritis of both hands 09/14/2016  . Osteoarthritis of both knees 09/14/2016  . Pure hypercholesterolemia 02/20/2021  . Sarcoidosis    with +lymph node biopsy  . SVT (supraventricular tachycardia) (HCC) 08/28/2015  . Tachycardia   . Uveitis     Family History  Adopted: Yes  Problem Relation Age of Onset  . Scleroderma Daughter    Past Surgical History:  Procedure Laterality Date  . CATARACT EXTRACTION, BILATERAL  2018  . CYSTECTOMY    . EYE SURGERY Bilateral 2018   cataract removal   . LUMBAR SPINE SURGERY  2009  . ROTATOR CUFF REPAIR Right 2011  . ROTATOR CUFF REPAIR Left 06/2018  . TONSILLECTOMY AND ADENOIDECTOMY  1962  . TOTAL ABDOMINAL HYSTERECTOMY  1984   Social History   Social History Narrative   Widower.   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   Immunization History  Administered Date(s) Administered  . DTaP 07/26/2010  . Influenza Split 07/19/2014  . Influenza,inj,Quad PF,6+ Mos 07/19/2016, 12/21/2018  . Influenza-Unspecified 07/09/2019  . Pneumococcal Conjugate-13 01/21/2015  . Pneumococcal Polysaccharide-23 09/19/2012, 09/21/2012  . Pneumococcal-Unspecified 07/26/2012, 01/18/2015  . Td 03/31/2010  . Tdap 05/07/2010  . Varicella 09/19/2012  . Zoster, Live 07/26/2012, 09/21/2012     Objective: Vital Signs: There were no vitals taken for this visit.   Physical Exam   Musculoskeletal Exam: ***  CDAI Exam: CDAI Score: -- Patient Global: --; Provider Global: -- Swollen: --; Tender: -- Joint Exam 07/15/2022   No joint exam has been documented for this visit   There is currently no information documented on the homunculus.  Go to the Rheumatology activity and complete the homunculus joint exam.  Investigation: No additional findings.  Imaging: No results found.  Recent Labs: Lab Results   Component Value Date   WBC 7.1 06/29/2022   HGB 13.3 06/29/2022   PLT 326 06/29/2022   NA 139 06/29/2022   K 4.7 06/29/2022   CL 109 06/29/2022   CO2 22 06/29/2022   GLUCOSE 136 (H) 06/29/2022   BUN 15 06/29/2022   CREATININE 0.86 06/29/2022   BILITOT 0.8 06/29/2022   ALKPHOS 59 06/29/2022   AST 29 06/29/2022   ALT 14 06/29/2022   PROT 6.3 (L) 06/29/2022   ALBUMIN 3.8 06/29/2022   CALCIUM 9.2 06/29/2022   GFRAA 79 05/20/2020   QFTBGOLD Negative 06/16/2017   QFTBGOLDPLUS Negative 06/29/2022    Speciality Comments: REMICADE 3 mg/kg x 6 weeks-ACY 06/17/20   Procedures:  No procedures performed Allergies: Codeine, Amoxicillin-pot clavulanate, Erythromycin base, Other, Sulfa antibiotics, Tramadol, Tramadol hcl, and Erythromycin   Assessment / Plan:     Visit Diagnoses: No diagnosis found.  Orders: No orders of the defined types were placed in this encounter.  No orders of the defined types were placed in this encounter.   Face-to-face time spent with patient was *** minutes. Greater than 50% of time was spent in counseling and coordination of care.  Follow-Up Instructions: No follow-ups on file.   Earnestine Mealing, CMA  Note - This record has been created using Editor, commissioning.  Chart creation errors have been sought, but may not always  have been located. Such creation errors do not reflect on  the standard of medical care.

## 2022-07-14 NOTE — Progress Notes (Deleted)
Office Visit Note  Patient: Renee Jones             Date of Birth: 08-20-1951           MRN: [id number]             PCP: Doreene Nest, NP Referring: Doreene Nest, NP Visit Date: 07/28/2022 Occupation: @GUAROCC @  Subjective:    History of Present Illness: Renee Jones is a 71 y.o. female with history of uveitis, sarcoidosis, and osteoarthritis.  Patient remains on Remicade IV infusion 3 mg/kg every 6 weeks, methotrexate 2.5 mg 8 tablets every 7 days, and folic acid 1 mg 2 tablets daily.   CBC and CMP updated on 06/29/22.  TB Gold negative on 06/29/2022.   Activities of Daily Living:  Patient reports morning stiffness for *** {minute/hour:19697}.   Patient {ACTIONS;DENIES/REPORTS:21021675::"Denies"} nocturnal pain.  Difficulty dressing/grooming: {ACTIONS;DENIES/REPORTS:21021675::"Denies"} Difficulty climbing stairs: {ACTIONS;DENIES/REPORTS:21021675::"Denies"} Difficulty getting out of chair: {ACTIONS;DENIES/REPORTS:21021675::"Denies"} Difficulty using hands for taps, buttons, cutlery, and/or writing: {ACTIONS;DENIES/REPORTS:21021675::"Denies"}  No Rheumatology ROS completed.   PMFS History:  Patient Active Problem List   Diagnosis Date Noted   Cervical spondylosis 11/12/2021   Hyperlipidemia 02/20/2021   Prediabetes 12/21/2018   High risk medications (not anticoagulants) long-term use 01/13/2017   GERD (gastroesophageal reflux disease) 09/14/2016   Bronchial spasm 09/14/2016   Glaucoma 09/14/2016   Detached retina 09/14/2016   Osteoarthritis of both hands 09/14/2016   Osteoarthritis of both knees 09/14/2016   DDD (degenerative disc disease), lumbar 09/14/2016   Uveitis 06/22/2016   SVT (supraventricular tachycardia) (HCC) 08/28/2015   Essential hypertension 08/28/2015   Diastolic dysfunction 08/28/2015   Sarcoidosis 09/30/2012   Pulmonary nodules 09/30/2012   Tachycardia 09/30/2012   Sarcoid arthropathy 09/30/2012    Past Medical  History:  Diagnosis Date   Allergy    Asthma    Bronchial spasm 09/14/2016   DDD (degenerative disc disease), lumbar 09/14/2016   Detached retina 09/14/2016   Diastolic dysfunction 08/28/2015   Essential hypertension 08/28/2015   GERD (gastroesophageal reflux disease)    Glaucoma 09/14/2016   Neuromuscular disorder (HCC)    Osteoarthritis of both hands 09/14/2016   Osteoarthritis of both knees 09/14/2016   Pure hypercholesterolemia 02/20/2021   Sarcoidosis    with +lymph node biopsy   SVT (supraventricular tachycardia) (HCC) 08/28/2015   Tachycardia    Uveitis     Family History  Adopted: Yes  Problem Relation Age of Onset   Scleroderma Daughter    Past Surgical History:  Procedure Laterality Date   CATARACT EXTRACTION, BILATERAL  2018   CYSTECTOMY     EYE SURGERY Bilateral 2018   cataract removal    LUMBAR SPINE SURGERY  2009   ROTATOR CUFF REPAIR Right 2011   ROTATOR CUFF REPAIR Left 06/2018   TONSILLECTOMY AND ADENOIDECTOMY  1962   TOTAL ABDOMINAL HYSTERECTOMY  1984   Social History   Social History Narrative   Widower.   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   Immunization History  Administered Date(s) Administered   DTaP 07/26/2010   Influenza Split 07/19/2014   Influenza,inj,Quad PF,6+ Mos 07/19/2016, 12/21/2018   Influenza-Unspecified 07/09/2019   Pneumococcal Conjugate-13 01/21/2015   Pneumococcal Polysaccharide-23 09/19/2012, 09/21/2012   Pneumococcal-Unspecified 07/26/2012, 01/18/2015   Td 03/31/2010   Tdap 05/07/2010   Varicella 09/19/2012   Zoster, Live 07/26/2012, 09/21/2012     Objective: Vital Signs: There were no vitals taken for this visit.   Physical Exam Vitals and nursing  note reviewed.  Constitutional:      Appearance: She is well-developed.  HENT:     Head: Normocephalic and atraumatic.  Eyes:     Conjunctiva/sclera: Conjunctivae normal.  Cardiovascular:     Rate and Rhythm: Normal rate and regular rhythm.      Heart sounds: Normal heart sounds.  Pulmonary:     Effort: Pulmonary effort is normal.     Breath sounds: Normal breath sounds.  Abdominal:     General: Bowel sounds are normal.     Palpations: Abdomen is soft.  Musculoskeletal:     Cervical back: Normal range of motion.  Skin:    General: Skin is warm and dry.     Capillary Refill: Capillary refill takes less than 2 seconds.  Neurological:     Mental Status: She is alert and oriented to person, place, and time.  Psychiatric:        Behavior: Behavior normal.      Musculoskeletal Exam: ***  CDAI Exam: CDAI Score: -- Patient Global: --; Provider Global: -- Swollen: --; Tender: -- Joint Exam 07/28/2022   No joint exam has been documented for this visit   There is currently no information documented on the homunculus. Go to the Rheumatology activity and complete the homunculus joint exam.  Investigation: No additional findings.  Imaging: No results found.  Recent Labs: Lab Results  Component Value Date   WBC 7.1 06/29/2022   HGB 13.3 06/29/2022   PLT 326 06/29/2022   NA 139 06/29/2022   K 4.7 06/29/2022   CL 109 06/29/2022   CO2 22 06/29/2022   GLUCOSE 136 (H) 06/29/2022   BUN 15 06/29/2022   CREATININE 0.86 06/29/2022   BILITOT 0.8 06/29/2022   ALKPHOS 59 06/29/2022   AST 29 06/29/2022   ALT 14 06/29/2022   PROT 6.3 (L) 06/29/2022   ALBUMIN 3.8 06/29/2022   CALCIUM 9.2 06/29/2022   GFRAA 79 05/20/2020   QFTBGOLD Negative 06/16/2017   QFTBGOLDPLUS Negative 06/29/2022    Speciality Comments: REMICADE 3 mg/kg x 6 weeks-ACY 06/17/20   Procedures:  No procedures performed Allergies: Codeine, Amoxicillin-pot clavulanate, Erythromycin base, Other, Sulfa antibiotics, Tramadol, Tramadol hcl, and Erythromycin   Assessment / Plan:     Visit Diagnoses: No diagnosis found.  Orders: No orders of the defined types were placed in this encounter.  No orders of the defined types were placed in this  encounter.   Face-to-face time spent with patient was *** minutes. Greater than 50% of time was spent in counseling and coordination of care.  Follow-Up Instructions: No follow-ups on file.   Earnestine Mealing, CMA  Note - This record has been created using Editor, commissioning.  Chart creation errors have been sought, but may not always  have been located. Such creation errors do not reflect on  the standard of medical care.

## 2022-07-15 ENCOUNTER — Encounter: Payer: Self-pay | Admitting: Physician Assistant

## 2022-07-15 ENCOUNTER — Ambulatory Visit: Payer: Medicare Other | Attending: Physician Assistant | Admitting: Physician Assistant

## 2022-07-15 ENCOUNTER — Ambulatory Visit (INDEPENDENT_AMBULATORY_CARE_PROVIDER_SITE_OTHER): Payer: Medicare Other

## 2022-07-15 VITALS — BP 130/78 | HR 65 | Resp 15 | Ht 63.0 in | Wt 136.2 lb

## 2022-07-15 DIAGNOSIS — M5136 Other intervertebral disc degeneration, lumbar region: Secondary | ICD-10-CM | POA: Diagnosis not present

## 2022-07-15 DIAGNOSIS — R202 Paresthesia of skin: Secondary | ICD-10-CM

## 2022-07-15 DIAGNOSIS — M19042 Primary osteoarthritis, left hand: Secondary | ICD-10-CM | POA: Diagnosis not present

## 2022-07-15 DIAGNOSIS — I10 Essential (primary) hypertension: Secondary | ICD-10-CM

## 2022-07-15 DIAGNOSIS — M25562 Pain in left knee: Secondary | ICD-10-CM

## 2022-07-15 DIAGNOSIS — Z9889 Other specified postprocedural states: Secondary | ICD-10-CM | POA: Diagnosis not present

## 2022-07-15 DIAGNOSIS — K219 Gastro-esophageal reflux disease without esophagitis: Secondary | ICD-10-CM

## 2022-07-15 DIAGNOSIS — H209 Unspecified iridocyclitis: Secondary | ICD-10-CM

## 2022-07-15 DIAGNOSIS — I471 Supraventricular tachycardia: Secondary | ICD-10-CM | POA: Diagnosis not present

## 2022-07-15 DIAGNOSIS — Z79899 Other long term (current) drug therapy: Secondary | ICD-10-CM | POA: Diagnosis not present

## 2022-07-15 DIAGNOSIS — M170 Bilateral primary osteoarthritis of knee: Secondary | ICD-10-CM

## 2022-07-15 DIAGNOSIS — G8929 Other chronic pain: Secondary | ICD-10-CM

## 2022-07-15 DIAGNOSIS — M19041 Primary osteoarthritis, right hand: Secondary | ICD-10-CM | POA: Diagnosis not present

## 2022-07-15 DIAGNOSIS — R918 Other nonspecific abnormal finding of lung field: Secondary | ICD-10-CM

## 2022-07-15 DIAGNOSIS — D869 Sarcoidosis, unspecified: Secondary | ICD-10-CM

## 2022-07-15 MED ORDER — DICLOFENAC SODIUM 1 % EX GEL: CUTANEOUS | 2 refills | Status: DC

## 2022-07-15 MED ORDER — METHOTREXATE SODIUM 2.5 MG PO TABS
20.0000 mg | ORAL_TABLET | ORAL | 0 refills | Status: DC
Start: 2022-07-15 — End: 2022-10-23

## 2022-07-15 NOTE — Progress Notes (Signed)
X-rays of left knee are consistent with mild OA and moderate chondromalacia patella of the knee. Unchanged from prior XR. Please notify the patient patient.

## 2022-07-16 ENCOUNTER — Other Ambulatory Visit: Payer: Self-pay | Admitting: Primary Care

## 2022-07-16 DIAGNOSIS — E785 Hyperlipidemia, unspecified: Secondary | ICD-10-CM

## 2022-07-17 DIAGNOSIS — M5416 Radiculopathy, lumbar region: Secondary | ICD-10-CM | POA: Diagnosis not present

## 2022-07-27 ENCOUNTER — Ambulatory Visit (INDEPENDENT_AMBULATORY_CARE_PROVIDER_SITE_OTHER): Payer: Medicare Other

## 2022-07-27 ENCOUNTER — Ambulatory Visit
Admission: EM | Admit: 2022-07-27 | Discharge: 2022-07-27 | Disposition: A | Discharge status: Home / Self Care | Payer: Medicare Other | Attending: Urgent Care | Admitting: Urgent Care

## 2022-07-27 DIAGNOSIS — W108XXA Fall (on) (from) other stairs and steps, initial encounter: Secondary | ICD-10-CM | POA: Diagnosis not present

## 2022-07-27 DIAGNOSIS — M7989 Other specified soft tissue disorders: Secondary | ICD-10-CM | POA: Diagnosis not present

## 2022-07-27 DIAGNOSIS — M25572 Pain in left ankle and joints of left foot: Secondary | ICD-10-CM

## 2022-07-27 DIAGNOSIS — M25472 Effusion, left ankle: Secondary | ICD-10-CM | POA: Diagnosis not present

## 2022-07-27 DIAGNOSIS — M069 Rheumatoid arthritis, unspecified: Secondary | ICD-10-CM | POA: Diagnosis not present

## 2022-07-27 DIAGNOSIS — S92352A Displaced fracture of fifth metatarsal bone, left foot, initial encounter for closed fracture: Secondary | ICD-10-CM

## 2022-07-27 NOTE — Discharge Instructions (Addendum)
Follow-up by scheduling an evaluation at your preferred orthopedic provider.

## 2022-07-27 NOTE — ED Provider Notes (Signed)
[facility]    CSN: [id number] Arrival date & time: 07/27/22  1547      History   Chief Complaint Chief Complaint  Patient presents with   Ankle Pain    HPI Renee Jones is a 71 y.o. female.    Ankle Pain   Patient presents to urgent care with complaint of left ankle injury which occurred when she slipped down a few stairs this morning.  She says that the pain is on her outer ankle bone and the top of her forefoot.  Patient believes that a precipitating event to her fall was returning from a flight from [place] yesterday.  She had leg cramping and difficulty sleeping overnight.  She states she is unable to bear weight on her left ankle.  Past Medical History:  Diagnosis Date   Allergy    Asthma    Bronchial spasm 09/14/2016   DDD (degenerative disc disease), lumbar 09/14/2016   Detached retina 15/40/0867   Diastolic dysfunction 61/06/5092   Essential hypertension 08/28/2015   GERD (gastroesophageal reflux disease)    Glaucoma 09/14/2016   Neuromuscular disorder ([place])    Osteoarthritis of both hands 09/14/2016   Osteoarthritis of both knees 09/14/2016   Pure hypercholesterolemia 02/20/2021   Sarcoidosis    with +lymph node biopsy   SVT (supraventricular tachycardia) 08/28/2015   Tachycardia    Uveitis     Patient Active Problem List   Diagnosis Date Noted   Cervical spondylosis 11/12/2021   Hyperlipidemia 02/20/2021   Prediabetes 12/21/2018   High risk medications (not anticoagulants) long-term use 01/13/2017   GERD (gastroesophageal reflux disease) 09/14/2016   Bronchial spasm 09/14/2016   Glaucoma 09/14/2016   Detached retina 09/14/2016   Osteoarthritis of both hands 09/14/2016   Osteoarthritis of both knees 09/14/2016   DDD (degenerative disc disease), lumbar 09/14/2016   Uveitis 06/22/2016   SVT (supraventricular tachycardia) 08/28/2015   Essential hypertension 26/71/2458   Diastolic dysfunction 09/98/3382   Sarcoidosis 09/30/2012    Pulmonary nodules 09/30/2012   Tachycardia 09/30/2012   Sarcoid arthropathy 09/30/2012    Past Surgical History:  Procedure Laterality Date   CATARACT EXTRACTION, BILATERAL  2018   CYSTECTOMY     EYE SURGERY Bilateral 2018   cataract removal    LUMBAR SPINE SURGERY  2009   ROTATOR CUFF REPAIR Right 2011   ROTATOR CUFF REPAIR Left 06/2018   TONSILLECTOMY AND ADENOIDECTOMY  1962   TOTAL ABDOMINAL HYSTERECTOMY  1984    OB History   No obstetric history on file.      Home Medications    Prior to Admission medications   Medication Sig Start Date End Date Taking? Authorizing Provider  albuterol (VENTOLIN HFA) 108 (90 Base) MCG/ACT inhaler Inhale 1-2 puffs into the lungs every 6 (six) hours as needed (cough). 08/20/21   Boddu, Erasmo Downer, FNP  amLODipine (NORVASC) 5 MG tablet Take 1 tablet (5 mg total) by mouth daily. for blood pressure. Office visit required for further refills. 04/22/22   Pleas Koch, NP  cholecalciferol (VITAMIN D) 1000 units tablet Take 1,000 Units by mouth daily.    [provider]  cyclobenzaprine (FLEXERIL) 5 MG tablet Take 1 tablet (5 mg total) by mouth at bedtime as needed for muscle spasms. 05/22/22   Pleas Koch, NP  diclofenac Sodium (VOLTAREN) 1 % GEL Apply 2-4 grams to affected joint 4 times daily as needed. 07/15/22   Ofilia Neas, PA-C  fluticasone (FLONASE) 50 MCG/ACT nasal spray Place 2 sprays  into both nostrils daily as needed for allergies or rhinitis.    [provider]  folic acid (FOLVITE) 1 MG tablet Take 2 tablets (2 mg total) by mouth daily. 04/03/22   Gearldine Bienenstock, PA-C  InFLIXimab (REMICADE IV) Inject into the vein every 6 (six) weeks.    [provider]  methotrexate 2.5 MG tablet Take 8 tablets (20 mg total) by mouth once a week. Caution:Chemotherapy. Protect from light. 07/15/22   Gearldine Bienenstock, PA-C  metoprolol tartrate (LOPRESSOR) 50 MG tablet TAKE 1 AND 1/2 TABLETS BY MOUTH TWICE DAILY 07/17/21    Chilton Si, MD  ondansetron (ZOFRAN) 4 MG tablet TAKE 1 TABLET BY MOUTH EVERY 6 HOURS AS NEEDED FOR NAUSEA OR VOMITING 01/16/22   Gearldine Bienenstock, PA-C  pantoprazole (PROTONIX) 40 MG tablet TAKE 1 TABLET BY MOUTH ONCE DAILY 08/17/20   Doreene Nest, NP  pravastatin (PRAVACHOL) 40 MG tablet Take 1 tablet (40 mg total) by mouth daily. for cholesterol. 07/16/22   Doreene Nest, NP  SYSTANE ULTRA 0.4-0.3 % SOLN SMARTSIG:1 Drop(s) In Eye(s) 6 Times Daily 07/15/20   [provider]    Family History Family History  Adopted: Yes  Problem Relation Age of Onset   Scleroderma Daughter     Social History Social History   Tobacco Use   Smoking status: Never   Smokeless tobacco: Never  Vaping Use   Vaping Use: Never used  Substance Use Topics   Alcohol use: No    Alcohol/week: 0.0 standard drinks of alcohol   Drug use: No     Allergies   Codeine, Amoxicillin-pot clavulanate, Erythromycin base, Other, Sulfa antibiotics, Tramadol, Tramadol hcl, and Erythromycin   Review of Systems Review of Systems   Physical Exam Triage Vital Signs ED Triage Vitals  Enc Vitals Group     BP 07/27/22 1644 (!) 146/77     Pulse --      Resp 07/27/22 1644 16     Temp 07/27/22 1644 97.9 F (36.6 C)     Temp src --      SpO2 07/27/22 1644 98 %     Weight --      Height --      Head Circumference --      Peak Flow --      Pain Score 07/27/22 1645 8     Pain Loc --      Pain Edu? --      Excl. in GC? --    No data found.  Updated Vital Signs BP (!) 146/77   Temp 97.9 F (36.6 C)   Resp 16   SpO2 98%   Visual Acuity Right Eye Distance:   Left Eye Distance:   Bilateral Distance:    Right Eye Near:   Left Eye Near:    Bilateral Near:     Physical Exam Vitals reviewed.  Constitutional:      Appearance: Normal appearance.  Musculoskeletal:     Left ankle: Swelling present. Tenderness present over the lateral malleolus. Decreased range of motion.     Left  foot: Tenderness present.     Comments: Swelling left lateral malleolus, Left lateral dorsal forefoot.  Skin:    General: Skin is warm and dry.  Neurological:     General: No focal deficit present.     Mental Status: She is alert and oriented to person, place, and time.  Psychiatric:        Mood and Affect: Mood normal.  Behavior: Behavior normal.      UC Treatments / Results  Labs (all labs ordered are listed, but only abnormal results are displayed) Labs Reviewed - No data to display  EKG   Radiology No results found.  Procedures Procedures (including critical care time)  Medications Ordered in UC Medications - No data to display  Initial Impression / Assessment and Plan / UC Course  I have reviewed the triage vital signs and the nursing notes.  Pertinent labs & imaging results that were available during my care of the patient were reviewed by me and considered in my medical decision making (see chart for details).   X-rays ordered and awaiting radiology read.  Foot x-ray shows fracture of the left fifth metatarsal, displaced.  Will not wait for radiology read but referring the patient to orthopedics.  She states she is a patient of [facility] and prefers to be seen there.   Final Clinical Impressions(s) / UC Diagnoses   Final diagnoses:  Pain of joint of left ankle and foot  Fall (on) (from) other stairs and steps, initial encounter   Discharge Instructions   None    ED Prescriptions   None    PDMP not reviewed this encounter.   Charma Igo, [place] 07/27/22 1743

## 2022-07-27 NOTE — ED Triage Notes (Signed)
Pt. States she fell down the steps  this morning and injured her left ankle. Pt. Has been experiencing left ankle pain and swelling.

## 2022-07-28 ENCOUNTER — Ambulatory Visit: Payer: Medicare Other

## 2022-07-28 ENCOUNTER — Other Ambulatory Visit: Payer: Self-pay | Admitting: Primary Care

## 2022-07-28 ENCOUNTER — Ambulatory Visit: Payer: Medicare Other | Admitting: Physician Assistant

## 2022-07-28 DIAGNOSIS — I10 Essential (primary) hypertension: Secondary | ICD-10-CM

## 2022-07-28 DIAGNOSIS — K219 Gastro-esophageal reflux disease without esophagitis: Secondary | ICD-10-CM

## 2022-07-28 DIAGNOSIS — H209 Unspecified iridocyclitis: Secondary | ICD-10-CM

## 2022-07-28 DIAGNOSIS — R918 Other nonspecific abnormal finding of lung field: Secondary | ICD-10-CM

## 2022-07-28 DIAGNOSIS — D869 Sarcoidosis, unspecified: Secondary | ICD-10-CM

## 2022-07-28 DIAGNOSIS — R202 Paresthesia of skin: Secondary | ICD-10-CM

## 2022-07-28 DIAGNOSIS — I471 Supraventricular tachycardia: Secondary | ICD-10-CM

## 2022-07-28 DIAGNOSIS — M19041 Primary osteoarthritis, right hand: Secondary | ICD-10-CM

## 2022-07-28 DIAGNOSIS — M170 Bilateral primary osteoarthritis of knee: Secondary | ICD-10-CM

## 2022-07-28 DIAGNOSIS — Z79899 Other long term (current) drug therapy: Secondary | ICD-10-CM

## 2022-07-28 DIAGNOSIS — M5136 Other intervertebral disc degeneration, lumbar region: Secondary | ICD-10-CM

## 2022-07-28 DIAGNOSIS — Z9889 Other specified postprocedural states: Secondary | ICD-10-CM

## 2022-07-29 DIAGNOSIS — S92352A Displaced fracture of fifth metatarsal bone, left foot, initial encounter for closed fracture: Secondary | ICD-10-CM | POA: Diagnosis not present

## 2022-07-29 DIAGNOSIS — M79672 Pain in left foot: Secondary | ICD-10-CM | POA: Diagnosis not present

## 2022-08-10 ENCOUNTER — Ambulatory Visit (HOSPITAL_COMMUNITY)
Admission: RE | Admit: 2022-08-10 | Discharge: 2022-08-10 | Disposition: A | Discharge status: Home / Self Care | Payer: Medicare Other | Source: Ambulatory Visit | Attending: Rheumatology | Admitting: Rheumatology

## 2022-08-10 NOTE — Progress Notes (Signed)
Pt here today for remicade, broke her foot and goes this week to find out if she needs surgery or not.  Dekvi at [place] office stated to hold remicade today.  Instructed patient to find out more about possible surgery and call us back to reschedule based on outcome.  Pt verbalized understanding.

## 2022-08-12 DIAGNOSIS — S92352A Displaced fracture of fifth metatarsal bone, left foot, initial encounter for closed fracture: Secondary | ICD-10-CM | POA: Diagnosis not present

## 2022-08-17 ENCOUNTER — Ambulatory Visit (HOSPITAL_COMMUNITY)
Admission: RE | Admit: 2022-08-17 | Discharge: 2022-08-17 | Disposition: A | Discharge status: Home / Self Care | Payer: Medicare Other | Source: Ambulatory Visit | Attending: Rheumatology | Admitting: Rheumatology

## 2022-08-17 DIAGNOSIS — H209 Unspecified iridocyclitis: Secondary | ICD-10-CM | POA: Diagnosis not present

## 2022-08-17 DIAGNOSIS — D869 Sarcoidosis, unspecified: Secondary | ICD-10-CM | POA: Diagnosis not present

## 2022-08-17 MED ORDER — ACETAMINOPHEN 325 MG PO TABS: 650.0000 mg | ORAL_TABLET | ORAL | Status: DC

## 2022-08-17 MED ORDER — SODIUM CHLORIDE 0.9 % IV SOLN
3.0000 mg/kg | INTRAVENOUS | Status: DC
  Administered 2022-08-17: 200 mg via INTRAVENOUS
  Filled 2022-08-17: qty 20

## 2022-08-17 MED ORDER — DIPHENHYDRAMINE HCL 25 MG PO CAPS: 25.0000 mg | ORAL_CAPSULE | ORAL | Status: DC

## 2022-08-19 ENCOUNTER — Other Ambulatory Visit: Payer: Self-pay | Admitting: Cardiovascular Disease

## 2022-08-19 DIAGNOSIS — M5416 Radiculopathy, lumbar region: Secondary | ICD-10-CM | POA: Diagnosis not present

## 2022-08-19 NOTE — Telephone Encounter (Signed)
Rx request sent to pharmacy.  

## 2022-08-25 DIAGNOSIS — K5900 Constipation, unspecified: Secondary | ICD-10-CM | POA: Diagnosis not present

## 2022-08-25 DIAGNOSIS — K219 Gastro-esophageal reflux disease without esophagitis: Secondary | ICD-10-CM | POA: Diagnosis not present

## 2022-08-25 DIAGNOSIS — Z1211 Encounter for screening for malignant neoplasm of colon: Secondary | ICD-10-CM | POA: Diagnosis not present

## 2022-08-31 DIAGNOSIS — Z1231 Encounter for screening mammogram for malignant neoplasm of breast: Secondary | ICD-10-CM | POA: Diagnosis not present

## 2022-08-31 LAB — HM MAMMOGRAPHY

## 2022-09-01 ENCOUNTER — Encounter: Payer: Self-pay | Admitting: Primary Care

## 2022-09-14 DIAGNOSIS — S92352A Displaced fracture of fifth metatarsal bone, left foot, initial encounter for closed fracture: Secondary | ICD-10-CM | POA: Diagnosis not present

## 2022-09-21 ENCOUNTER — Ambulatory Visit (HOSPITAL_COMMUNITY): Payer: Medicare Other

## 2022-09-28 ENCOUNTER — Other Ambulatory Visit: Payer: Self-pay | Admitting: Pharmacist

## 2022-09-28 ENCOUNTER — Telehealth: Payer: Self-pay | Admitting: Rheumatology

## 2022-09-28 ENCOUNTER — Ambulatory Visit (HOSPITAL_COMMUNITY)
Admission: RE | Admit: 2022-09-28 | Discharge: 2022-09-28 | Disposition: A | Discharge status: Home / Self Care | Payer: Medicare Other | Source: Ambulatory Visit | Attending: Rheumatology | Admitting: Rheumatology

## 2022-09-28 DIAGNOSIS — Z79899 Other long term (current) drug therapy: Secondary | ICD-10-CM

## 2022-09-28 DIAGNOSIS — H209 Unspecified iridocyclitis: Secondary | ICD-10-CM

## 2022-09-28 DIAGNOSIS — D869 Sarcoidosis, unspecified: Secondary | ICD-10-CM | POA: Insufficient documentation

## 2022-09-28 LAB — CBC WITH DIFFERENTIAL/PLATELET
Abs Immature Granulocytes: 0.01 10*3/uL (ref 0.00–0.07)
Basophils Absolute: 0 10*3/uL (ref 0.0–0.1)
Basophils Relative: 1 %
Eosinophils Absolute: 0.1 10*3/uL (ref 0.0–0.5)
Eosinophils Relative: 1 %
HCT: 39.1 % (ref 36.0–46.0)
Hemoglobin: 13.6 g/dL (ref 12.0–15.0)
Immature Granulocytes: 0 %
Lymphocytes Relative: 45 %
Lymphs Abs: 3.4 10*3/uL (ref 0.7–4.0)
MCH: 31.9 pg (ref 26.0–34.0)
MCHC: 34.8 g/dL (ref 30.0–36.0)
MCV: 91.6 fL (ref 80.0–100.0)
Monocytes Absolute: 0.4 10*3/uL (ref 0.1–1.0)
Monocytes Relative: 5 %
Neutro Abs: 3.6 10*3/uL (ref 1.7–7.7)
Neutrophils Relative %: 48 %
Platelets: 264 10*3/uL (ref 150–400)
RBC: 4.27 MIL/uL (ref 3.87–5.11)
RDW: 13 % (ref 11.5–15.5)
WBC: 7.6 10*3/uL (ref 4.0–10.5)
nRBC: 0 % (ref 0.0–0.2)

## 2022-09-28 LAB — COMPREHENSIVE METABOLIC PANEL
ALT: 15 U/L (ref 0–44)
AST: 20 U/L (ref 15–41)
Albumin: 3.9 g/dL (ref 3.5–5.0)
Alkaline Phosphatase: 52 U/L (ref 38–126)
Anion gap: 8 (ref 5–15)
BUN: 13 mg/dL (ref 8–23)
CO2: 24 mmol/L (ref 22–32)
Calcium: 9.2 mg/dL (ref 8.9–10.3)
Chloride: 109 mmol/L (ref 98–111)
Creatinine, Ser: 0.89 mg/dL (ref 0.44–1.00)
GFR, Estimated: >60 mL/min (ref >60)
Glucose, Bld: 143 mg/dL — ABNORMAL HIGH (ref 70–99)
Potassium: 3.9 mmol/L (ref 3.5–5.1)
Sodium: 141 mmol/L (ref 135–145)
Total Bilirubin: 0.8 mg/dL (ref 0.3–1.2)
Total Protein: 6.5 g/dL (ref 6.5–8.1)

## 2022-09-28 MED ORDER — DIPHENHYDRAMINE HCL 25 MG PO CAPS: 25.0000 mg | ORAL_CAPSULE | ORAL | Status: DC

## 2022-09-28 MED ORDER — SODIUM CHLORIDE 0.9 % IV SOLN
200.0000 mg | INTRAVENOUS | Status: DC
  Administered 2022-09-28: 200 mg via INTRAVENOUS
  Filled 2022-09-28: qty 20

## 2022-09-28 MED ORDER — ACETAMINOPHEN 325 MG PO TABS: 650.0000 mg | ORAL_TABLET | ORAL | Status: DC

## 2022-09-28 NOTE — Progress Notes (Signed)
Next infusion scheduled for Remicade on 11/16/2022 and due for updated orders. Diagnosis: uveitis + sarcoidosis  Dose: 3mg /kg every 6 weeks (200 mg based on last recorded weight of 60.8kg)  Last Clinic Visit: 07/15/2022 Next Clinic Visit: not scheduked  Last infusion: 09/28/2022  Labs: CBC and CMP on 09/28/2022  TB Gold: negative on 06/29/2022   Orders placed for Remicade x 2 doses along with premedication of acetaminophen and diphenhydramine to be administered 30 minutes before medication infusion.  Standing CBC with diff/platelet and CMP with GFR orders placed to be drawn every 12 weeks.  Next TB gold due 06/30/2023  08/30/2023, PharmD, MPH, BCPS, CPP Clinical Pharmacist (Rheumatology and Pulmonology)

## 2022-09-28 NOTE — Telephone Encounter (Signed)
Patient returned Sharon's call regarding her lab results.

## 2022-09-28 NOTE — Telephone Encounter (Signed)
Returned patient's call, see lab note for details.

## 2022-09-28 NOTE — Progress Notes (Signed)
Glucose is 143. Rest of CMP WNL

## 2022-09-28 NOTE — Progress Notes (Signed)
CBC with diff WNL

## 2022-10-21 DIAGNOSIS — S92352A Displaced fracture of fifth metatarsal bone, left foot, initial encounter for closed fracture: Secondary | ICD-10-CM | POA: Diagnosis not present

## 2022-10-23 ENCOUNTER — Other Ambulatory Visit: Payer: Self-pay | Admitting: Physician Assistant

## 2022-10-23 NOTE — Telephone Encounter (Signed)
February 2024

## 2022-10-23 NOTE — Telephone Encounter (Signed)
Next Visit: not on file  Last Visit: 07/15/2022  Last Fill: 07/15/2022  DX: Uveitis   Current Dose per office note 07/15/2022: methotrexate 2.5 mg 8 tablets every 7 days   Labs: 09/28/2022 Glucose is 143. Rest of CMP WNL   Okay to refill MTX and Zofran? When should patient follow up?

## 2022-10-23 NOTE — Telephone Encounter (Signed)
Please schedule patient for a follow up visit 11/2022. Thanks!

## 2022-11-09 ENCOUNTER — Encounter (HOSPITAL_COMMUNITY): Payer: Medicare Other

## 2022-11-09 NOTE — Progress Notes (Unsigned)
Office Visit Note  Patient: Renee Jones             Date of Birth: November 15, 1950           MRN: [id number]             PCP: Doreene Nest, NP Referring: Doreene Nest, NP Visit Date: 11/23/2022 Occupation: @GUAROCC @  Subjective:  Medication monitoring   History of Present Illness: Renee Jones is a 72 y.o. female with history of uveitis, sarcoidosis, osteoarthritis, and DDD. She remains on Remicade IV infusion 3 mg/kg every 6 weeks, methotrexate 2.5 mg 8 tablets every 7 days, and folic acid 1 mg 2 tablets daily.  Her last Remicade infusion was administered on 11/16/2022.  She has not missed any doses of methotrexate or Remicade recently.  Patient reports that she has been experiencing intermittent aching behind both eyes.  She states that the episodic eye pain and pressure has happened a few times within the last 1-2 months.  Her symptoms initially started in December 2023 with no identifiable trigger.  She has not noticed any eye pain or photophobia.  She has an upcoming appointment scheduled at [facility] eye care for further evaluation.  She denies any new or worsening pulmonary symptoms.  She has not had any signs or symptoms of a sarcoidosis flare.  She has been experiencing increased pain in the left knee joint.  She underwent Hyalgan injections for both knees in June/July 2023.  She only had temporary relief in the left knee so she would like to reapply for a different Visco gel injection in the future.  She denies any joint swelling at this time.  She denies any other joint pain or joint swelling currently.  She takes Tylenol arthritis as needed for pain relief.  She denies any recent or recurrent infections.    Activities of Daily Living:  Patient reports morning stiffness for 15 minutes.   Patient Reports nocturnal pain.  Difficulty dressing/grooming: Denies Difficulty climbing stairs: Denies Difficulty getting out of chair: Denies Difficulty using hands for  taps, buttons, cutlery, and/or writing: Reports  Review of Systems  Constitutional: Negative.  Negative for fatigue.  HENT: Negative.  Negative for mouth sores and mouth dryness.   Eyes:  Positive for dryness.  Respiratory: Negative.  Negative for shortness of breath.   Cardiovascular: Negative.  Negative for chest pain and palpitations.  Gastrointestinal: Negative.  Negative for blood in stool, constipation and diarrhea.  Endocrine: Negative.  Negative for increased urination.  Genitourinary: Negative.  Negative for involuntary urination.  Musculoskeletal:  Positive for joint pain, joint pain and morning stiffness. Negative for gait problem, joint swelling, myalgias, muscle weakness, muscle tenderness and myalgias.  Skin: Negative.  Negative for color change, rash, hair loss and sensitivity to sunlight.  Allergic/Immunologic: Negative.  Negative for susceptible to infections.  Neurological: Negative.  Negative for dizziness and headaches.  Hematological: Negative.  Negative for swollen glands.  Psychiatric/Behavioral:  Positive for sleep disturbance. Negative for depressed mood. The patient is not nervous/anxious.     PMFS History:  Patient Active Problem List   Diagnosis Date Noted   Cervical spondylosis 11/12/2021   Hyperlipidemia 02/20/2021   Prediabetes 12/21/2018   High risk medications (not anticoagulants) long-term use 01/13/2017   GERD (gastroesophageal reflux disease) 09/14/2016   Bronchial spasm 09/14/2016   Glaucoma 09/14/2016   Detached retina 09/14/2016   Osteoarthritis of both hands 09/14/2016   Osteoarthritis of both knees 09/14/2016   DDD (degenerative disc  disease), lumbar 09/14/2016   Uveitis 06/22/2016   SVT (supraventricular tachycardia) 08/28/2015   Essential hypertension 76/28/3151   Diastolic dysfunction 76/16/0737   Sarcoidosis 09/30/2012   Pulmonary nodules 09/30/2012   Tachycardia 09/30/2012   Sarcoid arthropathy 09/30/2012    Past Medical History:   Diagnosis Date   Allergy    Asthma    Bronchial spasm 09/14/2016   DDD (degenerative disc disease), lumbar 09/14/2016   Detached retina 10/62/6948   Diastolic dysfunction 54/03/2702   Essential hypertension 08/28/2015   GERD (gastroesophageal reflux disease)    Glaucoma 09/14/2016   Neuromuscular disorder ([place])    Osteoarthritis of both hands 09/14/2016   Osteoarthritis of both knees 09/14/2016   Pure hypercholesterolemia 02/20/2021   Sarcoidosis    with +lymph node biopsy   SVT (supraventricular tachycardia) 08/28/2015   Tachycardia    Uveitis     Family History  Adopted: Yes  Problem Relation Age of Onset   Scleroderma Daughter    Past Surgical History:  Procedure Laterality Date   CATARACT EXTRACTION, BILATERAL  2018   CYSTECTOMY     EYE SURGERY Bilateral 2018   cataract removal    LUMBAR SPINE SURGERY  2009   ROTATOR CUFF REPAIR Right 2011   ROTATOR CUFF REPAIR Left 06/2018   TONSILLECTOMY AND ADENOIDECTOMY  1962   TOTAL ABDOMINAL HYSTERECTOMY  1984   Social History   Social History Narrative   [place].   Retired. Worked as a Education officer, museum.   Moved from [place].   Immunization History  Administered Date(s) Administered   DTaP 07/26/2010   Influenza Split 07/19/2014   Influenza,inj,Quad PF,6+ Mos 07/19/2016, 12/21/2018   Influenza-Unspecified 07/09/2019   Pneumococcal Conjugate-13 01/21/2015   Pneumococcal Polysaccharide-23 09/19/2012, 09/21/2012   Pneumococcal-Unspecified 07/26/2012, 01/18/2015   Td 03/31/2010   Tdap 05/07/2010   Varicella 09/19/2012   Zoster, Live 07/26/2012, 09/21/2012     Objective: Vital Signs: BP 124/79 (BP Location: Left Arm, Patient Position: Sitting, Cuff Size: Normal)   Pulse 64   Ht 5\' 3"  (1.6 m)   Wt 136 lb (61.7 kg)   BMI 24.09 kg/m    Physical Exam Vitals and nursing note reviewed.  Constitutional:      Appearance: She is well-developed.  HENT:     Head: Normocephalic and atraumatic.  Eyes:      Conjunctiva/sclera: Conjunctivae normal.  Cardiovascular:     Rate and Rhythm: Normal rate and regular rhythm.     Heart sounds: Normal heart sounds.  Pulmonary:     Effort: Pulmonary effort is normal.     Breath sounds: Normal breath sounds.  Abdominal:     General: Bowel sounds are normal.     Palpations: Abdomen is soft.  Musculoskeletal:     Cervical back: Normal range of motion.  Skin:    General: Skin is warm and dry.     Capillary Refill: Capillary refill takes less than 2 seconds.  Neurological:     Mental Status: She is alert and oriented to person, place, and time.  Psychiatric:        Behavior: Behavior normal.      Musculoskeletal Exam: C-spine, thoracic spine, lumbar spine have good range of motion.  No midline spinal tenderness.  Shoulder joints, elbow joints, wrist joints, MCPs, PIPs, DIPs have good range of motion with no synovitis.  Complete fist formation bilaterally.  Hip joints have good range of motion with no groin pain.  Knee joints have good range of motion with no warmth or  effusion.  Mild knee crepitus noted. Ankle joints have good range of motion with no tenderness or joint swelling.  CDAI Exam: CDAI Score: -- Patient Global: --; Provider Global: -- Swollen: --; Tender: -- Joint Exam 11/23/2022   No joint exam has been documented for this visit   There is currently no information documented on the homunculus. Go to the Rheumatology activity and complete the homunculus joint exam.  Investigation: No additional findings.  Imaging: No results found.  Recent Labs: Lab Results  Component Value Date   WBC 7.6 09/28/2022   HGB 13.6 09/28/2022   PLT 264 09/28/2022   NA 141 09/28/2022   K 3.9 09/28/2022   CL 109 09/28/2022   CO2 24 09/28/2022   GLUCOSE 143 (H) 09/28/2022   BUN 13 09/28/2022   CREATININE 0.89 09/28/2022   BILITOT 0.8 09/28/2022   ALKPHOS 52 09/28/2022   AST 20 09/28/2022   ALT 15 09/28/2022   PROT 6.5 09/28/2022   ALBUMIN 3.9  09/28/2022   CALCIUM 9.2 09/28/2022   GFRAA 79 05/20/2020   QFTBGOLD Negative 06/16/2017   QFTBGOLDPLUS Negative 06/29/2022    Speciality Comments: REMICADE 3 mg/kg x 6 weeks-ACY 06/17/20   Procedures:  No procedures performed Allergies: Codeine, Amoxicillin-pot clavulanate, Erythromycin base, Other, Sulfa antibiotics, Tramadol, Tramadol hcl, and Erythromycin     Assessment / Plan:     Visit Diagnoses: Uveitis - [facility].  She has been experiencing an aching sensation in both eyes intermittently since December 2023.  She has not had any identifiable trigger for the symptoms.  She has not had any eye redness or photophobia.  She describes the pain as a pressure behind her eyes especially in the right eye.  She remains on Remicade and methotrexate as prescribed.  She has not missed any doses recently.  No conjunctival injection was noted on examination today.  She was not experiencing any photophobia while in the office.  She has an upcoming appointment scheduled at [facility] care for further evaluation.  She was advised to notify us if she is told that she has signs or symptoms of a uveitis flare.  In the meantime she will remain on Remicade and methotrexate as combination therapy.  Sarcoidosis: She has not had any signs or symptoms of a sarcoidosis flare.  No new or worsening pulmonary symptoms.  Chest x-ray updated on 09/16/2021 did not reveal any active cardiopulmonary disease.  She has no signs of inflammatory arthritis at this time.  No EN lesions noted. She remains on IV Remicade infusions 3 mg/kg every 6 weeks and methotrexate 8 tablets by mouth once weekly.  She has not missed any doses of Remicade or methotrexate recently.  She was advised to notify us if she develops any new or worsening symptoms.  She will follow-up in the office in 5 months or sooner if needed.  High risk medications (not anticoagulants) long-term use - Remicade IV infusion 3 mg/kg every 6 weeks, methotrexate  2.5 mg 8 tablets by mouth once weekly every 7 days, and folic acid 1 mg 2 tablets daily. CBC and CMP updated on 09/28/22.  She will continue to have updated lab work with her infusions. TB gold negative on 06/29/22 and will continue to be monitored yearly. No recent or recurrent infections. Discussed the importance of holding remicade and methotrexate if she develops signs or symptoms of an infection and to resume once the infection has completely cleared.   Pulmonary nodules: Chest x-ray on 09/16/2021 no active  cardiopulmonary disease.  S/P left rotator cuff repair: Good range of motion of the left shoulder with no discomfort currently.  History of repair of right rotator cuff: She has good range of motion of the right shoulder with no discomfort at this time.  Primary osteoarthritis of both hands: She has PIP and DIP thickening consistent with osteoarthritis of both hands.  No tenderness or inflammation noted.  Complete fist formation bilaterally.  According to the patient her hand pain and stiffness improved after increasing the dose of vitamin D to 5000 units daily.  Primary osteoarthritis of both knees - s/p hyalgan bilateral knees 03/2022-04/2022.X-rays of the left knee were consistent with mild osteoarthritis and moderate chondromalacia patella on 07/15/2022.  She underwent Hyalgan injections for both knees in June/July 2023.  She is not currently experiencing any pain or stiffness in the right knee joint.  The right knee joint has good range of motion on exam with no warmth or effusion.  She has been experiencing increased pain in the left knee and had an inadequate response to her last Hyalgan series.  Patient would like to try a different brand of Visco gel injections.  Plan to apply for Euflexxa injections for the left knee.  DDD (degenerative disc disease), lumbar - Followed by Dr. Shon Baton.  She is not experiencing any increased discomfort in her lower back at this time.  She has no symptoms of  radiculopathy.  Osteoporosis screening -DEXA updated on 11/01/2020: Right femoral neck BMD 0.74 with T-score -1.0.  Patient had a recent fall in October 2023 and fractured the fifth metatarsal.  She was under the care of Dr. Veleta Miners but did not reveal surgical intervention.  She increased her vitamin D supplementation to 5000 units daily.  Recommended updating a bone density.  Future order placed today.   Plan: DG Bone Density  History of foot fracture - Fall October 2023.  Closed fracture of fifth metatarsal.  Under care of Dr. Veleta Miners.  Cleared to resume wearing normal shoes with support.   DEXA order placed today. Plan: DG Bone Density  Postmenopausal -Future order for DEXA placed today.  Plan: DG Bone Density  Other medical conditions are listed as follows:  SVT (supraventricular tachycardia)  Essential hypertension: Blood pressure was 124/79 today in the office.  Gastroesophageal reflux disease without esophagitis   Orders: Orders Placed This Encounter  Procedures   DG Bone Density   No orders of the defined types were placed in this encounter.    Follow-Up Instructions: Return in about 5 months (around 04/23/2023) for Uveitis, Sarcoidosis.   Gearldine Bienenstock, PA-C  Note - This record has been created using Dragon software.  Chart creation errors have been sought, but may not always  have been located. Such creation errors do not reflect on  the standard of medical care.

## 2022-11-16 ENCOUNTER — Ambulatory Visit (HOSPITAL_COMMUNITY)
Admission: RE | Admit: 2022-11-16 | Discharge: 2022-11-16 | Disposition: A | Discharge status: Home / Self Care | Payer: Medicare Other | Source: Ambulatory Visit | Attending: Rheumatology | Admitting: Rheumatology

## 2022-11-16 DIAGNOSIS — Z79899 Other long term (current) drug therapy: Secondary | ICD-10-CM | POA: Insufficient documentation

## 2022-11-16 DIAGNOSIS — H209 Unspecified iridocyclitis: Secondary | ICD-10-CM | POA: Diagnosis not present

## 2022-11-16 DIAGNOSIS — D869 Sarcoidosis, unspecified: Secondary | ICD-10-CM | POA: Insufficient documentation

## 2022-11-16 MED ORDER — DIPHENHYDRAMINE HCL 25 MG PO CAPS: 25.0000 mg | ORAL_CAPSULE | ORAL | Status: DC

## 2022-11-16 MED ORDER — ACETAMINOPHEN 325 MG PO TABS: 650.0000 mg | ORAL_TABLET | ORAL | Status: DC

## 2022-11-16 MED ORDER — SODIUM CHLORIDE 0.9 % IV SOLN
3.0000 mg/kg | INTRAVENOUS | Status: DC
  Administered 2022-11-16: 200 mg via INTRAVENOUS
  Filled 2022-11-16: qty 20

## 2022-11-23 ENCOUNTER — Ambulatory Visit: Payer: Medicare Other | Attending: Physician Assistant | Admitting: Physician Assistant

## 2022-11-23 ENCOUNTER — Encounter: Payer: Self-pay | Admitting: Physician Assistant

## 2022-11-23 ENCOUNTER — Telehealth: Payer: Self-pay

## 2022-11-23 VITALS — BP 124/79 | HR 64 | Ht 63.0 in | Wt 136.0 lb

## 2022-11-23 DIAGNOSIS — Z9889 Other specified postprocedural states: Secondary | ICD-10-CM | POA: Diagnosis not present

## 2022-11-23 DIAGNOSIS — Z8781 Personal history of (healed) traumatic fracture: Secondary | ICD-10-CM

## 2022-11-23 DIAGNOSIS — M5136 Other intervertebral disc degeneration, lumbar region: Secondary | ICD-10-CM | POA: Diagnosis not present

## 2022-11-23 DIAGNOSIS — I10 Essential (primary) hypertension: Secondary | ICD-10-CM

## 2022-11-23 DIAGNOSIS — M19041 Primary osteoarthritis, right hand: Secondary | ICD-10-CM

## 2022-11-23 DIAGNOSIS — R918 Other nonspecific abnormal finding of lung field: Secondary | ICD-10-CM

## 2022-11-23 DIAGNOSIS — K219 Gastro-esophageal reflux disease without esophagitis: Secondary | ICD-10-CM | POA: Diagnosis not present

## 2022-11-23 DIAGNOSIS — M19042 Primary osteoarthritis, left hand: Secondary | ICD-10-CM

## 2022-11-23 DIAGNOSIS — M170 Bilateral primary osteoarthritis of knee: Secondary | ICD-10-CM

## 2022-11-23 DIAGNOSIS — H209 Unspecified iridocyclitis: Secondary | ICD-10-CM

## 2022-11-23 DIAGNOSIS — Z13820 Encounter for screening for osteoporosis: Secondary | ICD-10-CM

## 2022-11-23 DIAGNOSIS — Z79899 Other long term (current) drug therapy: Secondary | ICD-10-CM

## 2022-11-23 DIAGNOSIS — D869 Sarcoidosis, unspecified: Secondary | ICD-10-CM

## 2022-11-23 DIAGNOSIS — M51369 Other intervertebral disc degeneration, lumbar region without mention of lumbar back pain or lower extremity pain: Secondary | ICD-10-CM

## 2022-11-23 DIAGNOSIS — R202 Paresthesia of skin: Secondary | ICD-10-CM

## 2022-11-23 DIAGNOSIS — I471 Supraventricular tachycardia, unspecified: Secondary | ICD-10-CM

## 2022-11-23 DIAGNOSIS — Z780 Asymptomatic menopausal state: Secondary | ICD-10-CM | POA: Diagnosis not present

## 2022-11-23 NOTE — Telephone Encounter (Signed)
Please apply for left knee euflexxa, per Hazel Sams, PA-C. (Inadequate response to hyalgan). Thanks!

## 2022-12-01 NOTE — Telephone Encounter (Signed)
VOB submitted for Euflexxa, Left knee(s) BV pending

## 2022-12-01 NOTE — Telephone Encounter (Signed)
Please call to schedule visco injections.  Approved for Euflexxa, Left knee(s). [place] Patient is covered at 100% after deductible is met ([place]: $152/$240) No co-pay Deductible does apply No pre-certifications

## 2022-12-14 NOTE — Telephone Encounter (Signed)
LMOM for patient to call and schedule Visco injections.

## 2022-12-23 NOTE — Telephone Encounter (Signed)
Patient called the office requesting to schedule Visco knee injections. Advised patient that we were waiting on the medication to be delivered and would call back once the medication was received.

## 2022-12-28 ENCOUNTER — Ambulatory Visit (HOSPITAL_COMMUNITY)
Admission: RE | Admit: 2022-12-28 | Discharge: 2022-12-28 | Disposition: A | Discharge status: Home / Self Care | Payer: Medicare Other | Source: Ambulatory Visit | Attending: Rheumatology | Admitting: Rheumatology

## 2022-12-28 ENCOUNTER — Other Ambulatory Visit: Payer: Self-pay | Admitting: Pharmacist

## 2022-12-28 DIAGNOSIS — Z79899 Other long term (current) drug therapy: Secondary | ICD-10-CM | POA: Insufficient documentation

## 2022-12-28 DIAGNOSIS — H209 Unspecified iridocyclitis: Secondary | ICD-10-CM | POA: Insufficient documentation

## 2022-12-28 DIAGNOSIS — D869 Sarcoidosis, unspecified: Secondary | ICD-10-CM | POA: Diagnosis not present

## 2022-12-28 LAB — CBC WITH DIFFERENTIAL/PLATELET
Abs Immature Granulocytes: 0.02 10*3/uL (ref 0.00–0.07)
Basophils Absolute: 0 10*3/uL (ref 0.0–0.1)
Basophils Relative: 0 %
Eosinophils Absolute: 0.1 10*3/uL (ref 0.0–0.5)
Eosinophils Relative: 1 %
HCT: 40.1 % (ref 36.0–46.0)
Hemoglobin: 13.7 g/dL (ref 12.0–15.0)
Immature Granulocytes: 0 %
Lymphocytes Relative: 48 %
Lymphs Abs: 3.9 10*3/uL (ref 0.7–4.0)
MCH: 31.2 pg (ref 26.0–34.0)
MCHC: 34.2 g/dL (ref 30.0–36.0)
MCV: 91.3 fL (ref 80.0–100.0)
Monocytes Absolute: 0.5 10*3/uL (ref 0.1–1.0)
Monocytes Relative: 7 %
Neutro Abs: 3.6 10*3/uL (ref 1.7–7.7)
Neutrophils Relative %: 44 %
Platelets: 311 10*3/uL (ref 150–400)
RBC: 4.39 MIL/uL (ref 3.87–5.11)
RDW: 13.3 % (ref 11.5–15.5)
WBC: 8.2 10*3/uL (ref 4.0–10.5)
nRBC: 0 % (ref 0.0–0.2)

## 2022-12-28 LAB — COMPREHENSIVE METABOLIC PANEL
ALT: 13 U/L (ref 0–44)
AST: 27 U/L (ref 15–41)
Albumin: 3.6 g/dL (ref 3.5–5.0)
Alkaline Phosphatase: 67 U/L (ref 38–126)
Anion gap: 14 (ref 5–15)
BUN: 13 mg/dL (ref 8–23)
CO2: 17 mmol/L — ABNORMAL LOW (ref 22–32)
Calcium: 9.1 mg/dL (ref 8.9–10.3)
Chloride: 109 mmol/L (ref 98–111)
Creatinine, Ser: 0.86 mg/dL (ref 0.44–1.00)
GFR, Estimated: >60 mL/min (ref >60)
Glucose, Bld: 170 mg/dL — ABNORMAL HIGH (ref 70–99)
Potassium: 3.9 mmol/L (ref 3.5–5.1)
Sodium: 140 mmol/L (ref 135–145)
Total Bilirubin: 0.7 mg/dL (ref 0.3–1.2)
Total Protein: 5.9 g/dL — ABNORMAL LOW (ref 6.5–8.1)

## 2022-12-28 MED ORDER — SODIUM CHLORIDE 0.9 % IV SOLN
3.0000 mg/kg | INTRAVENOUS | Status: DC
  Administered 2022-12-28: 200 mg via INTRAVENOUS
  Filled 2022-12-28: qty 20

## 2022-12-28 MED ORDER — ACETAMINOPHEN 325 MG PO TABS: 650.0000 mg | ORAL_TABLET | ORAL | Status: DC

## 2022-12-28 MED ORDER — DIPHENHYDRAMINE HCL 25 MG PO CAPS: 25.0000 mg | ORAL_CAPSULE | ORAL | Status: DC

## 2022-12-28 NOTE — Progress Notes (Signed)
Next infusion scheduled for Remicade on 02/08/23 and due for updated orders. Diagnosis: uveitis, sarcoidosis  Dose: 3mg /kg every 6 weeks  Last Clinic Visit: 11/23/22 Next Clinic Visit: 02/08/23  Last infusion: 12/28/22  Labs: CBC/CMP on 12/28/22 - stable, glucose is elevated TB Gold: negative on 06/29/2022   Orders placed for Remicade x 2 doses along with premedication of acetaminophen and diphenhydramine to be administered 30 minutes before medication infusion.  Standing CBC with diff/platelet and CMP with GFR orders placed to be drawn every 2 months.  Next TB gold due 06/30/2023  Knox Saliva, PharmD, MPH, BCPS, CPP Clinical Pharmacist (Rheumatology and Pulmonology)

## 2022-12-28 NOTE — Telephone Encounter (Signed)
Reached out to patient to schedule Visco knee injections. Patient states she is on the way to the hospital for an infusion and will call once she is settled in with her calendar.

## 2022-12-28 NOTE — Progress Notes (Signed)
CBC Wnl

## 2022-12-29 NOTE — Progress Notes (Signed)
Glucose is elevated.  Please notify patient and forward results to her PCP.

## 2023-01-11 ENCOUNTER — Telehealth: Payer: Self-pay | Admitting: Primary Care

## 2023-01-11 DIAGNOSIS — J22 Unspecified acute lower respiratory infection: Secondary | ICD-10-CM

## 2023-01-11 DIAGNOSIS — D869 Sarcoidosis, unspecified: Secondary | ICD-10-CM

## 2023-01-11 MED ORDER — ALBUTEROL SULFATE HFA 108 (90 BASE) MCG/ACT IN AERS: 1.0000 | INHALATION_SPRAY | Freq: Four times a day (QID) | RESPIRATORY_TRACT | 0 refills | Status: AC | PRN

## 2023-01-11 NOTE — Telephone Encounter (Signed)
Refills sent to pharmacy. 

## 2023-01-11 NOTE — Telephone Encounter (Signed)
Prescription Request  01/11/2023  LOV: 05/22/2022  What is the name of the medication or equipment? albuterol (VENTOLIN HFA) 108 (90 Base) MCG/ACT inhaler   Have you contacted your pharmacy to request a refill? No   Which pharmacy would you like this sent to?  [id number] - [place], [place] - [address] [place] [address] Phone: [phone] Fax: [phone]  Patient notified that their request is being sent to the clinical staff for review and that they should receive a response within 2 business days.   Please advise at Mobile [phone] (mobile)

## 2023-01-14 ENCOUNTER — Telehealth: Payer: Self-pay | Admitting: *Deleted

## 2023-01-14 ENCOUNTER — Encounter: Payer: Self-pay | Admitting: Family Medicine

## 2023-01-14 ENCOUNTER — Telehealth (INDEPENDENT_AMBULATORY_CARE_PROVIDER_SITE_OTHER): Payer: Medicare Other | Admitting: Family Medicine

## 2023-01-14 VITALS — BP 142/71 | HR 87 | Wt 138.0 lb

## 2023-01-14 DIAGNOSIS — R051 Acute cough: Secondary | ICD-10-CM | POA: Diagnosis not present

## 2023-01-14 MED ORDER — DOXYCYCLINE HYCLATE 100 MG PO TABS: 100.0000 mg | ORAL_TABLET | Freq: Two times a day (BID) | ORAL | 0 refills | Status: DC

## 2023-01-14 MED ORDER — PREDNISONE 20 MG PO TABS: 40.0000 mg | ORAL_TABLET | Freq: Every day | ORAL | 0 refills | Status: DC

## 2023-01-14 MED ORDER — BENZONATATE 100 MG PO CAPS: ORAL_CAPSULE | ORAL | 0 refills | Status: DC

## 2023-01-14 NOTE — Progress Notes (Signed)
Virtual Visit via Video Note  I connected with Renee Jones  on 01/14/23 at 10:40 AM EDT by a video enabled telemedicine application and verified that I am speaking with the correct person using two identifiers.  Location patient: [place] Location provider:work or home office Persons participating in the virtual visit: patient, provider  I discussed the limitations and requested verbal permission for telemedicine visit. The patient expressed understanding and agreed to proceed.   HPI:  Acute telemedicine visit for cough and congestion: -Onset: 2 days  -Symptoms include:nasal congestion, runny nose, cough, some green sputum -moderate amounts, r ear feels stopped up, some mild SOB at times -reports covid test negative -reports this happens every year -Denies: fever, CP, talking triggers the cough, NVD, fluids, no sick contacts -she is requesting an abx as reports gets this always this time of the year and usually requires prednisone and abx -she is able to drink fluids -Has tried: cough drops, alka seltzer cold and albuterol for the cough  - but has not helped -Pertinent past medical history: see below -Pertinent medication allergies: Allergies  Allergen Reactions   Codeine Itching and Hives    Other reaction(s): itching internally   Amoxicillin-Pot Clavulanate Diarrhea   Erythromycin Base    Other     Other reaction(s): Unknown Other reaction(s): itching internally   Sulfa Antibiotics Itching     vomiting   Tramadol Itching   Tramadol Hcl     Other reaction(s): hives   Erythromycin Itching and Hives    Other reaction(s): itching internally   -COVID-19 vaccine status:  Immunization History  Administered Date(s) Administered   DTaP 07/26/2010   Influenza Split 07/19/2014   Influenza,inj,Quad PF,6+ Mos 07/19/2016, 12/21/2018   Influenza-Unspecified 07/09/2019   Pneumococcal Conjugate-13 01/21/2015   Pneumococcal Polysaccharide-23 09/19/2012, 09/21/2012   Pneumococcal-Unspecified  07/26/2012, 01/18/2015   Td 03/31/2010   Tdap 05/07/2010   Varicella 09/19/2012   Zoster, Live 07/26/2012, 09/21/2012     ROS: See pertinent positives and negatives per HPI.  Past Medical History:  Diagnosis Date   Allergy    Asthma    Bronchial spasm 09/14/2016   DDD (degenerative disc disease), lumbar 09/14/2016   Detached retina 0000000   Diastolic dysfunction Q000111Q   Essential hypertension 08/28/2015   GERD (gastroesophageal reflux disease)    Glaucoma 09/14/2016   Neuromuscular disorder ([place])    Osteoarthritis of both hands 09/14/2016   Osteoarthritis of both knees 09/14/2016   Pure hypercholesterolemia 02/20/2021   Sarcoidosis    with +lymph node biopsy   SVT (supraventricular tachycardia) 08/28/2015   Tachycardia    Uveitis     Past Surgical History:  Procedure Laterality Date   CATARACT EXTRACTION, BILATERAL  2018   CYSTECTOMY     EYE SURGERY Bilateral 2018   cataract removal    LUMBAR SPINE SURGERY  2009   ROTATOR CUFF REPAIR Right 2011   ROTATOR CUFF REPAIR Left 06/2018   TONSILLECTOMY AND ADENOIDECTOMY  1962   TOTAL ABDOMINAL HYSTERECTOMY  1984     Current Outpatient Medications:    albuterol (VENTOLIN HFA) 108 (90 Base) MCG/ACT inhaler, Inhale 1-2 puffs into the lungs every 6 (six) hours as needed (cough)., Disp: 6.7 g, Rfl: 0   amLODipine (NORVASC) 5 MG tablet, Take 1 tablet (5 mg total) by mouth daily. for blood pressure., Disp: 90 tablet, Rfl: 2   cyclobenzaprine (FLEXERIL) 5 MG tablet, Take 1 tablet (5 mg total) by mouth at bedtime as needed for muscle spasms., Disp: 30 tablet, Rfl: 0  fluticasone (FLONASE) 50 MCG/ACT nasal spray, Place 2 sprays into both nostrils daily as needed for allergies or rhinitis., Disp: , Rfl:    folic acid (FOLVITE) 1 MG tablet, Take 2 tablets (2 mg total) by mouth daily., Disp: 180 tablet, Rfl: 3   InFLIXimab (REMICADE IV), Inject into the vein every 6 (six) weeks., Disp: , Rfl:    methotrexate (RHEUMATREX) 2.5 MG  tablet, TAKE 8 TABLETS BY MOUTH ONCE A WEEK PROTECT FROM LIGHT, Disp: 96 tablet, Rfl: 0   metoprolol tartrate (LOPRESSOR) 50 MG tablet, TAKE 1 AND 1/2 TABLETS BY MOUTH TWICE DAILY, Disp: 270 tablet, Rfl: 1   ondansetron (ZOFRAN) 4 MG tablet, TAKE 1 TABLET BY MOUTH EVERY 6 HOURS AS NEEDED FOR NAUSEA OR VOMITING, Disp: 30 tablet, Rfl: 0   pantoprazole (PROTONIX) 40 MG tablet, TAKE 1 TABLET BY MOUTH ONCE DAILY, Disp: 90 tablet, Rfl: 1   pravastatin (PRAVACHOL) 40 MG tablet, Take 1 tablet (40 mg total) by mouth daily. for cholesterol., Disp: 90 tablet, Rfl: 2   SYSTANE ULTRA 0.4-0.3 % SOLN, SMARTSIG:1 Drop(s) In Eye(s) 6 Times Daily, Disp: , Rfl:    VITAMIN D PO, Take 1,000 Units by mouth daily., Disp: , Rfl:    benzonatate (TESSALON PERLES) 100 MG capsule, 1-2 capsules up to twice daily as needed for cough., Disp: 30 capsule, Rfl: 0   doxycycline (VIBRA-TABS) 100 MG tablet, Take 1 tablet (100 mg total) by mouth 2 (two) times daily., Disp: 14 tablet, Rfl: 0   predniSONE (DELTASONE) 20 MG tablet, Take 2 tablets (40 mg total) by mouth daily with breakfast., Disp: 8 tablet, Rfl: 0  EXAM:  VITALS per patient if applicable:  GENERAL: alert, oriented, appears well and in no acute distress  HEENT: atraumatic, conjunttiva clear, no obvious abnormalities on inspection of external nose and ears  NECK: normal movements of the head and neck  LUNGS: on inspection no signs of respiratory distress, breathing rate appears normal, no obvious gross SOB, gasping or wheezing  CV: no obvious cyanosis  MS: moves all visible extremities without noticeable abnormality  PSYCH/NEURO: pleasant and cooperative, no obvious depression or anxiety, speech and thought processing grossly intact  ASSESSMENT AND PLAN:  Discussed the following assessment and plan:  Acute cough  -we discussed possible serious and likely etiologies, options for evaluation and workup, limitations of telemedicine visit vs in person visit,  treatment, treatment risks and precautions. Pt is agreeable to treatment via telemedicine at this moment. Query VRI, possible bacterial resp illness vs other. Did advise 2nd covid test and to follow up if positive. Discussed risks/benefits abx, prednisone. She wishes to try empiric tx w/ doxy and prednisone, prn alb. She agrees to let her rheumatologist know and to seek prompt in person care if worsening, new symptoms arise, or if is not improving with treatment as expected per our conversation of expected course. Discussed options for follow up care. Did let this patient know that I do telemedicine on Tuesdays and Thursdays for [place] and those are the days I am logged into the system. Advised to schedule follow up visit with PCP, [place] virtual visits or [facility] if any further questions or concerns to avoid delays in care.   I discussed the assessment and treatment plan with the patient. The patient was provided an opportunity to ask questions and all were answered. The patient agreed with the plan and demonstrated an understanding of the instructions.     Lucretia Kern, DO

## 2023-01-14 NOTE — Telephone Encounter (Signed)
Patient does not have the crown scheduled yet. She wanted to know what your advice was prior to scheduling.

## 2023-01-14 NOTE — Telephone Encounter (Signed)
When is the crown scheduled?  She may want to postpone her infusion to ensure she does not have any delayed healing.  Patient should postpone her Remicade infusion if she has any signs or symptoms of an infection.  If she is given a course of antibiotics she should complete the course of antibiotics prior to scheduling her Remicade infusion.

## 2023-01-14 NOTE — Telephone Encounter (Signed)
Patient contacted the office and states she needs to have a crown done with the dentist. Patient states she has her Remicade infusion scheduled for 02/08/2023. Patient states she would like to know if she needs to wait any certain amount or time or delay next infusion after crown. Please advise.

## 2023-01-14 NOTE — Patient Instructions (Signed)
-  I sent the medication(s) we discussed to your pharmacy: Meds ordered this encounter  Medications   predniSONE (DELTASONE) 20 MG tablet    Sig: Take 2 tablets (40 mg total) by mouth daily with breakfast.    Dispense:  8 tablet    Refill:  0   doxycycline (VIBRA-TABS) 100 MG tablet    Sig: Take 1 tablet (100 mg total) by mouth 2 (two) times daily.    Dispense:  14 tablet    Refill:  0   benzonatate (TESSALON PERLES) 100 MG capsule    Sig: 1-2 capsules up to twice daily as needed for cough.    Dispense:  30 capsule    Refill:  0     I hope you are feeling better soon!  Seek in person care promptly if your symptoms worsen, new concerns arise or you are not improving with treatment.  It was nice to meet you today. I help [facility] out with telemedicine visits on Tuesdays and Thursdays and am happy to help if you need a virtual follow up visit on those days. Otherwise, if you have any concerns or questions following this visit please schedule a follow up visit with your Primary Care office or seek care at a local urgent care clinic to avoid delays in care. If you are having severe or life threatening symptoms please call 911 and/or go to the nearest emergency room.

## 2023-01-18 NOTE — Telephone Encounter (Signed)
Patient advised she may want to postpone her infusion to ensure she does not have any delayed healing. Patient should postpone her Remicade infusion if she has any signs or symptoms of an infection. If she is given a course of antibiotics she should complete the course of antibiotics prior to scheduling her Remicade infusion. Patient expressed understanding.

## 2023-02-05 ENCOUNTER — Other Ambulatory Visit (HOSPITAL_COMMUNITY): Payer: Self-pay | Admitting: *Deleted

## 2023-02-05 DIAGNOSIS — H0102B Squamous blepharitis left eye, upper and lower eyelids: Secondary | ICD-10-CM | POA: Diagnosis not present

## 2023-02-05 DIAGNOSIS — H0288A Meibomian gland dysfunction right eye, upper and lower eyelids: Secondary | ICD-10-CM | POA: Diagnosis not present

## 2023-02-05 DIAGNOSIS — H40013 Open angle with borderline findings, low risk, bilateral: Secondary | ICD-10-CM | POA: Diagnosis not present

## 2023-02-05 DIAGNOSIS — H0288B Meibomian gland dysfunction left eye, upper and lower eyelids: Secondary | ICD-10-CM | POA: Diagnosis not present

## 2023-02-05 DIAGNOSIS — H0102A Squamous blepharitis right eye, upper and lower eyelids: Secondary | ICD-10-CM | POA: Diagnosis not present

## 2023-02-05 DIAGNOSIS — H2013 Chronic iridocyclitis, bilateral: Secondary | ICD-10-CM | POA: Diagnosis not present

## 2023-02-08 ENCOUNTER — Ambulatory Visit (INDEPENDENT_AMBULATORY_CARE_PROVIDER_SITE_OTHER): Payer: Medicare Other | Admitting: Physician Assistant

## 2023-02-08 ENCOUNTER — Ambulatory Visit (HOSPITAL_COMMUNITY)
Admission: RE | Admit: 2023-02-08 | Discharge: 2023-02-08 | Disposition: A | Discharge status: Home / Self Care | Payer: Medicare Other | Source: Ambulatory Visit | Attending: Rheumatology | Admitting: Rheumatology

## 2023-02-08 VITALS — BP 138/66 | HR 66 | Temp 97.6°F | Resp 17 | Wt 137.0 lb

## 2023-02-08 DIAGNOSIS — Z79899 Other long term (current) drug therapy: Secondary | ICD-10-CM | POA: Diagnosis not present

## 2023-02-08 DIAGNOSIS — D8686 Sarcoid arthropathy: Secondary | ICD-10-CM

## 2023-02-08 DIAGNOSIS — H209 Unspecified iridocyclitis: Secondary | ICD-10-CM

## 2023-02-08 DIAGNOSIS — M1712 Unilateral primary osteoarthritis, left knee: Secondary | ICD-10-CM

## 2023-02-08 DIAGNOSIS — D869 Sarcoidosis, unspecified: Secondary | ICD-10-CM

## 2023-02-08 MED ORDER — SODIUM CHLORIDE 0.9 % IV SOLN
3.0000 mg/kg | INTRAVENOUS | Status: DC
  Administered 2023-02-08: 200 mg via INTRAVENOUS
  Filled 2023-02-08: qty 20

## 2023-02-08 MED ORDER — LIDOCAINE HCL 1 % IJ SOLN
1.5000 mL | INTRAMUSCULAR | Status: AC | PRN
Start: 2023-02-08 — End: 2023-02-08
  Administered 2023-02-08: 1.5 mL

## 2023-02-08 MED ORDER — ACETAMINOPHEN 325 MG PO TABS: 650.0000 mg | ORAL_TABLET | ORAL | Status: DC

## 2023-02-08 MED ORDER — SODIUM HYALURONATE (VISCOSUP) 20 MG/2ML IX SOSY
20.0000 mg | PREFILLED_SYRINGE | INTRA_ARTICULAR | Status: AC | PRN
Start: 2023-02-08 — End: 2023-02-08
  Administered 2023-02-08: 20 mg via INTRA_ARTICULAR

## 2023-02-08 MED ORDER — DIPHENHYDRAMINE HCL 25 MG PO CAPS: 25.0000 mg | ORAL_CAPSULE | ORAL | Status: DC

## 2023-02-08 NOTE — Progress Notes (Signed)
   Procedure Note  Patient: Renee Jones             Date of Birth: 08-Feb-1951           MRN: [id number]             Visit Date: 02/08/2023  Procedures: Visit Diagnoses:  1. Primary osteoarthritis of left knee    Euflexxa #1 left knee, B/B Large Joint Inj: L knee on 02/08/2023 11:47 AM Indications: pain Details: 27 G 1.5 in needle, medial approach  Arthrogram: No  Medications: 1.5 mL lidocaine 1 %; 20 mg Sodium Hyaluronate (Viscosup) 20 MG/2ML Aspirate: 0 mL Outcome: tolerated well, no immediate complications Procedure, treatment alternatives, risks and benefits explained, specific risks discussed. Consent was given by the patient. Immediately prior to procedure a time out was called to verify the correct patient, procedure, equipment, support staff and site/side marked as required. Patient was prepped and draped in the usual sterile fashion.    Patient tolerated the procedure well.  Aftercare was discussed.  Sherron Ales, PA-C

## 2023-02-15 ENCOUNTER — Ambulatory Visit: Payer: Medicare Other | Attending: Physician Assistant | Admitting: Physician Assistant

## 2023-02-15 DIAGNOSIS — M1712 Unilateral primary osteoarthritis, left knee: Secondary | ICD-10-CM | POA: Diagnosis not present

## 2023-02-15 MED ORDER — LIDOCAINE HCL 1 % IJ SOLN
1.5000 mL | INTRAMUSCULAR | Status: AC | PRN
Start: 2023-02-15 — End: 2023-02-15
  Administered 2023-02-15: 1.5 mL

## 2023-02-15 MED ORDER — SODIUM HYALURONATE (VISCOSUP) 20 MG/2ML IX SOSY
20.0000 mg | PREFILLED_SYRINGE | INTRA_ARTICULAR | Status: AC | PRN
Start: 2023-02-15 — End: 2023-02-15
  Administered 2023-02-15: 20 mg via INTRA_ARTICULAR

## 2023-02-15 NOTE — Progress Notes (Signed)
   Procedure Note  Patient: Renee Jones             Date of Birth: 1951/09/23           MRN: [id number]             Visit Date: 02/15/2023  Procedures: Visit Diagnoses:  1. Primary osteoarthritis of left knee    Euflexxa #2 left knee, B/B Large Joint Inj: L knee on 02/15/2023 2:21 PM Indications: pain Details: 27 G 1.5 in needle, medial approach  Arthrogram: No  Medications: 1.5 mL lidocaine 1 %; 20 mg Sodium Hyaluronate (Viscosup) 20 MG/2ML Aspirate: 0 mL Outcome: tolerated well, no immediate complications Procedure, treatment alternatives, risks and benefits explained, specific risks discussed. Consent was given by the patient. Immediately prior to procedure a time out was called to verify the correct patient, procedure, equipment, support staff and site/side marked as required. Patient was prepped and draped in the usual sterile fashion.     Patient tolerated the procedure well.  Aftercare was discussed.  Sherron Ales, PA-C

## 2023-02-19 ENCOUNTER — Other Ambulatory Visit: Payer: Self-pay | Admitting: Physician Assistant

## 2023-02-19 NOTE — Telephone Encounter (Signed)
Last Fill: 10/23/2022  Labs: 12/28/2022 CBC Wnl Glucose is elevated.   Next Visit: 02/22/2023  Last Visit: 11/23/2022  DX: Sarcoidosis   Current Dose per office note 11/23/2022: methotrexate 8 tablets by mouth once weekly.   Okay to refill Methotrexate?

## 2023-02-22 ENCOUNTER — Ambulatory Visit: Payer: Medicare Other | Attending: Physician Assistant | Admitting: Physician Assistant

## 2023-02-22 DIAGNOSIS — M1712 Unilateral primary osteoarthritis, left knee: Secondary | ICD-10-CM | POA: Diagnosis not present

## 2023-02-22 MED ORDER — LIDOCAINE HCL 1 % IJ SOLN
1.5000 mL | INTRAMUSCULAR | Status: AC | PRN
Start: 2023-02-22 — End: 2023-02-22
  Administered 2023-02-22: 1.5 mL

## 2023-02-22 MED ORDER — SODIUM HYALURONATE (VISCOSUP) 20 MG/2ML IX SOSY
20.0000 mg | PREFILLED_SYRINGE | INTRA_ARTICULAR | Status: AC | PRN
Start: 2023-02-22 — End: 2023-02-22
  Administered 2023-02-22: 20 mg via INTRA_ARTICULAR

## 2023-02-22 NOTE — Progress Notes (Signed)
   Procedure Note  Patient: Renee Jones             Date of Birth: 1951-03-27           MRN: [id number]             Visit Date: 02/22/2023  Procedures: Visit Diagnoses:  1. Primary osteoarthritis of left knee    Euflexxa #3 left knee, B/B Large Joint Inj: L knee on 02/22/2023 1:22 PM Indications: pain Details: 27 G 1.5 in needle, medial approach  Arthrogram: No  Medications: 1.5 mL lidocaine 1 %; 20 mg Sodium Hyaluronate (Viscosup) 20 MG/2ML Aspirate: 0 mL Outcome: tolerated well, no immediate complications Procedure, treatment alternatives, risks and benefits explained, specific risks discussed. Consent was given by the patient. Immediately prior to procedure a time out was called to verify the correct patient, procedure, equipment, support staff and site/side marked as required. Patient was prepped and draped in the usual sterile fashion.      Patient tolerated the procedure well. Aftercare was discussed. Sherron Ales, PA-C

## 2023-02-27 ENCOUNTER — Other Ambulatory Visit: Payer: Self-pay | Admitting: Cardiovascular Disease

## 2023-03-01 NOTE — Telephone Encounter (Signed)
Rx request sent to pharmacy.  

## 2023-03-09 ENCOUNTER — Encounter: Payer: Self-pay | Admitting: Primary Care

## 2023-03-09 ENCOUNTER — Ambulatory Visit (INDEPENDENT_AMBULATORY_CARE_PROVIDER_SITE_OTHER)
Admission: RE | Admit: 2023-03-09 | Discharge: 2023-03-09 | Disposition: A | Discharge status: Home / Self Care | Payer: Medicare Other | Source: Ambulatory Visit | Attending: Primary Care | Admitting: Primary Care

## 2023-03-09 ENCOUNTER — Telehealth: Payer: Self-pay

## 2023-03-09 ENCOUNTER — Ambulatory Visit (INDEPENDENT_AMBULATORY_CARE_PROVIDER_SITE_OTHER): Payer: Medicare Other | Admitting: Primary Care

## 2023-03-09 VITALS — BP 136/78 | HR 82 | Temp 97.9°F | Ht 63.0 in | Wt 135.0 lb

## 2023-03-09 DIAGNOSIS — R053 Chronic cough: Secondary | ICD-10-CM | POA: Diagnosis not present

## 2023-03-09 DIAGNOSIS — R059 Cough, unspecified: Secondary | ICD-10-CM | POA: Diagnosis not present

## 2023-03-09 DIAGNOSIS — D869 Sarcoidosis, unspecified: Secondary | ICD-10-CM | POA: Diagnosis not present

## 2023-03-09 DIAGNOSIS — E785 Hyperlipidemia, unspecified: Secondary | ICD-10-CM

## 2023-03-09 HISTORY — DX: Chronic cough: R05.3

## 2023-03-09 MED ORDER — PANTOPRAZOLE SODIUM 40 MG PO TBEC
40.0000 mg | DELAYED_RELEASE_TABLET | Freq: Two times a day (BID) | ORAL | 0 refills | Status: DC
Start: 2023-03-09 — End: 2023-06-02

## 2023-03-09 MED ORDER — PRAVASTATIN SODIUM 40 MG PO TABS
40.0000 mg | ORAL_TABLET | Freq: Every day | ORAL | 0 refills | Status: DC
Start: 2023-03-09 — End: 2023-06-13

## 2023-03-09 NOTE — Patient Instructions (Signed)
We increased your pantoprazole prescription. Take 1 tablet twice daily for cough.  Resume Zyrtec 10 mg every night.  Complete xray(s) prior to leaving today. I will notify you of your results once received.  It was a pleasure to see you today!

## 2023-03-09 NOTE — Telephone Encounter (Signed)
Received refill request for Pravastatin 40mg  tabs

## 2023-03-09 NOTE — Addendum Note (Signed)
Addended by: Doreene Nest on: 03/09/2023 04:24 PM   Modules accepted: Orders

## 2023-03-09 NOTE — Assessment & Plan Note (Signed)
Lungs clear on exam.  Differentials include reflux induced cough, allergies, sarcoidosis progression.  Chest xray ordered and pending today. Increase pantoprazole to 40 mg BID temporarily. Resume Zyrtec 10 mg HS.  Consider inhaled corticosteroid inhaler.  Await xray results and patient updates.

## 2023-03-09 NOTE — Progress Notes (Signed)
Subjective:    Patient ID: Renee Jones, female    DOB: [birth year], 72 y.o.   MRN: [id number]  Cough Associated symptoms include postnasal drip. Pertinent negatives include no chest pain, chills, ear pain, fever, sore throat or shortness of breath. Her past medical history is significant for environmental allergies.    Renee Jones is a very pleasant 72 y.o. female wit a history of GERD, sarcoidosis, glaucoma, pulmonary nodules who presents today to discuss cough.  Symptom onset in February 2024 with a dry, tickling cough with increased mucous production to the nose and chest.   Evaluated through a virtual visit on 01/14/23 by Dr. Selena Batten for a "2 day" history of productive cough and congestion, right ear fullness with negative Covid-19 testing. She was treated with Doxycycline and prednisone courses with close follow up recommendations with PCP and rheumatology.   After treatment with Doxycycline and prednisone she felt no improvement. Since then she continues to cough which is frequent throughout the day, especially worse when talking or eating. Her cough is dry. She also notices a sensation of something in her throat. She will often experience coughing spells, esophageal burning despite management on pantoprazole 40 mg daily. She takes Zyrtec on occasion and has noticed increased post nasal drip.   She is managed on Remicade infusion every 6 weeks and methotrexate 20 mg weekly per rheumatology for sarcoidosis. Last infusion was 02/08/23.   She has not had a chest xray recently. She has used her albuterol inhaler at times and didn't really notice improvement.   She denies fevers, chest tightness, shortness of breath.   Review of Systems  Constitutional:  Negative for chills and fever.  HENT:  Positive for postnasal drip. Negative for congestion, ear pain, sinus pressure, sore throat and trouble swallowing.   Respiratory:  Positive for cough. Negative for chest tightness  and shortness of breath.   Cardiovascular:  Negative for chest pain.  Allergic/Immunologic: Positive for environmental allergies.         Past Medical History:  Diagnosis Date   Allergy    Asthma    Bronchial spasm 09/14/2016   DDD (degenerative disc disease), lumbar 09/14/2016   Detached retina 09/14/2016   Diastolic dysfunction 08/28/2015   Essential hypertension 08/28/2015   GERD (gastroesophageal reflux disease)    Glaucoma 09/14/2016   Neuromuscular disorder (HCC)    Osteoarthritis of both hands 09/14/2016   Osteoarthritis of both knees 09/14/2016   Pure hypercholesterolemia 02/20/2021   Sarcoidosis    with +lymph node biopsy   SVT (supraventricular tachycardia) 08/28/2015   Tachycardia    Uveitis     Social History   Socioeconomic History   Marital status: Widowed    Spouse name: Not on file   Number of children: Not on file   Years of education: Not on file   Highest education level: Not on file  Occupational History   Not on file  Tobacco Use   Smoking status: Never   Smokeless tobacco: Never  Vaping Use   Vaping Use: Never used  Substance and Sexual Activity   Alcohol use: No    Alcohol/week: 0.0 standard drinks of alcohol   Drug use: No   Sexual activity: Yes  Other Topics Concern   Not on file  Social History Narrative   Widower.   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   Social Determinants of Health   Financial Resource Strain: Low Risk  (04/10/2022)   Overall  Financial Resource Strain (CARDIA)    Difficulty of Paying Living Expenses: Not hard at all  Food Insecurity: Food Insecurity Present (04/10/2022)   Hunger Vital Sign    Worried About Running Out of Food in the Last Year: Often true    Ran Out of Food in the Last Year: Often true  Transportation Needs: No Transportation Needs (04/10/2022)   PRAPARE - Administrator, Civil Service (Medical): No    Lack of Transportation (Non-Medical): No  Physical Activity:  Insufficiently Active (04/10/2022)   Exercise Vital Sign    Days of Exercise per Week: 3 days    Minutes of Exercise per Session: 30 min  Stress: No Stress Concern Present (04/10/2022)   [employer] of Occupational Health - Occupational Stress Questionnaire    Feeling of Stress : Not at all  Social Connections: Moderately Isolated (04/10/2022)   Social Connection and Isolation Panel [NHANES]    Frequency of Communication with Friends and Family: More than three times a week    Frequency of Social Gatherings with Friends and Family: More than three times a week    Attends Religious Services: More than 4 times per year    Active Member of [employer] or Organizations: No    Attends Banker Meetings: Never    Marital Status: Widowed  Intimate Partner Violence: Not At Risk (04/10/2022)   Humiliation, Afraid, Rape, and Kick questionnaire    Fear of Current or Ex-Partner: No    Emotionally Abused: No    Physically Abused: No    Sexually Abused: No    Past Surgical History:  Procedure Laterality Date   CATARACT EXTRACTION, BILATERAL  2018   CYSTECTOMY     EYE SURGERY Bilateral 2018   cataract removal    LUMBAR SPINE SURGERY  2009   ROTATOR CUFF REPAIR Right 2011   ROTATOR CUFF REPAIR Left 06/2018   TONSILLECTOMY AND ADENOIDECTOMY  1962   TOTAL ABDOMINAL HYSTERECTOMY  1984    Family History  Adopted: Yes  Problem Relation Age of Onset   Scleroderma Daughter     Allergies  Allergen Reactions   Codeine Itching and Hives    Other reaction(s): itching internally   Amoxicillin-Pot Clavulanate Diarrhea   Erythromycin Base    Other     Other reaction(s): Unknown Other reaction(s): itching internally   Sulfa Antibiotics Itching     vomiting   Tramadol Itching   Tramadol Hcl     Other reaction(s): hives   Erythromycin Itching and Hives    Other reaction(s): itching internally    Current Outpatient Medications on File Prior to Visit  Medication Sig Dispense Refill    albuterol (VENTOLIN HFA) 108 (90 Base) MCG/ACT inhaler Inhale 1-2 puffs into the lungs every 6 (six) hours as needed (cough). 6.7 g 0   amLODipine (NORVASC) 5 MG tablet Take 1 tablet (5 mg total) by mouth daily. for blood pressure. 90 tablet 2   benzonatate (TESSALON PERLES) 100 MG capsule 1-2 capsules up to twice daily as needed for cough. 30 capsule 0   cyclobenzaprine (FLEXERIL) 5 MG tablet Take 1 tablet (5 mg total) by mouth at bedtime as needed for muscle spasms. 30 tablet 0   doxycycline (VIBRA-TABS) 100 MG tablet Take 1 tablet (100 mg total) by mouth 2 (two) times daily. 14 tablet 0   fluticasone (FLONASE) 50 MCG/ACT nasal spray Place 2 sprays into both nostrils daily as needed for allergies or rhinitis.     folic acid (  FOLVITE) 1 MG tablet Take 2 tablets (2 mg total) by mouth daily. 180 tablet 3   InFLIXimab (REMICADE IV) Inject into the vein every 6 (six) weeks.     methotrexate (RHEUMATREX) 2.5 MG tablet TAKE 8 TABLETS BY MOUTH ONCE A WEEK, PROTECT FROM LIGHT 96 tablet 0   metoprolol tartrate (LOPRESSOR) 50 MG tablet Take 1.5 tablets (75 mg total) by mouth 2 (two) times daily. 270 tablet 0   ondansetron (ZOFRAN) 4 MG tablet TAKE 1 TABLET BY MOUTH EVERY 6 HOURS AS NEEDED FOR NAUSEA OR VOMITING 30 tablet 0   pantoprazole (PROTONIX) 40 MG tablet TAKE 1 TABLET BY MOUTH ONCE DAILY 90 tablet 1   pravastatin (PRAVACHOL) 40 MG tablet Take 1 tablet (40 mg total) by mouth daily. for cholesterol. 90 tablet 2   predniSONE (DELTASONE) 20 MG tablet Take 2 tablets (40 mg total) by mouth daily with breakfast. 8 tablet 0   SYSTANE ULTRA 0.4-0.3 % SOLN SMARTSIG:1 Drop(s) In Eye(s) 6 Times Daily     VITAMIN D PO Take 1,000 Units by mouth daily.     No current facility-administered medications on file prior to visit.    There were no vitals taken for this visit. Objective:   Physical Exam HENT:     Right Ear: Tympanic membrane and ear canal normal.     Left Ear: Tympanic membrane and ear canal  normal.     Nose:     Right Sinus: No maxillary sinus tenderness or frontal sinus tenderness.     Left Sinus: No maxillary sinus tenderness or frontal sinus tenderness.     Mouth/Throat:     Pharynx: No posterior oropharyngeal erythema.  Eyes:     Conjunctiva/sclera: Conjunctivae normal.  Cardiovascular:     Rate and Rhythm: Normal rate and regular rhythm.  Pulmonary:     Effort: Pulmonary effort is normal.     Breath sounds: Normal breath sounds. No wheezing or rales.  Musculoskeletal:     Cervical back: Neck supple.  Lymphadenopathy:     Cervical: No cervical adenopathy.  Skin:    General: Skin is warm and dry.           Assessment & Plan:  There are no diagnoses linked to this encounter.      Doreene Nest, NP

## 2023-03-09 NOTE — Telephone Encounter (Signed)
Refills sent to pharmacy. 

## 2023-03-18 DIAGNOSIS — S92352A Displaced fracture of fifth metatarsal bone, left foot, initial encounter for closed fracture: Secondary | ICD-10-CM | POA: Diagnosis not present

## 2023-03-22 ENCOUNTER — Ambulatory Visit (HOSPITAL_COMMUNITY)
Admission: RE | Admit: 2023-03-22 | Discharge: 2023-03-22 | Disposition: A | Discharge status: Home / Self Care | Payer: Medicare Other | Source: Ambulatory Visit | Attending: Rheumatology | Admitting: Rheumatology

## 2023-03-22 DIAGNOSIS — Z79899 Other long term (current) drug therapy: Secondary | ICD-10-CM | POA: Diagnosis not present

## 2023-03-22 DIAGNOSIS — D869 Sarcoidosis, unspecified: Secondary | ICD-10-CM | POA: Diagnosis not present

## 2023-03-22 DIAGNOSIS — H209 Unspecified iridocyclitis: Secondary | ICD-10-CM | POA: Insufficient documentation

## 2023-03-22 DIAGNOSIS — D8686 Sarcoid arthropathy: Secondary | ICD-10-CM

## 2023-03-22 LAB — CBC WITH DIFFERENTIAL/PLATELET
Abs Immature Granulocytes: 0.01 10*3/uL (ref 0.00–0.07)
Basophils Absolute: 0 10*3/uL (ref 0.0–0.1)
Basophils Relative: 0 %
Eosinophils Absolute: 0.1 10*3/uL (ref 0.0–0.5)
Eosinophils Relative: 1 %
HCT: 40.9 % (ref 36.0–46.0)
Hemoglobin: 13.9 g/dL (ref 12.0–15.0)
Immature Granulocytes: 0 %
Lymphocytes Relative: 46 %
Lymphs Abs: 3.5 10*3/uL (ref 0.7–4.0)
MCH: 31.3 pg (ref 26.0–34.0)
MCHC: 34 g/dL (ref 30.0–36.0)
MCV: 92.1 fL (ref 80.0–100.0)
Monocytes Absolute: 0.6 10*3/uL (ref 0.1–1.0)
Monocytes Relative: 7 %
Neutro Abs: 3.5 10*3/uL (ref 1.7–7.7)
Neutrophils Relative %: 46 %
Platelets: 298 10*3/uL (ref 150–400)
RBC: 4.44 MIL/uL (ref 3.87–5.11)
RDW: 13.2 % (ref 11.5–15.5)
WBC: 7.7 10*3/uL (ref 4.0–10.5)
nRBC: 0 % (ref 0.0–0.2)

## 2023-03-22 LAB — COMPREHENSIVE METABOLIC PANEL
ALT: 14 U/L (ref 0–44)
AST: 19 U/L (ref 15–41)
Albumin: 4 g/dL (ref 3.5–5.0)
Alkaline Phosphatase: 63 U/L (ref 38–126)
Anion gap: 9 (ref 5–15)
BUN: 13 mg/dL (ref 8–23)
CO2: 23 mmol/L (ref 22–32)
Calcium: 9.3 mg/dL (ref 8.9–10.3)
Chloride: 106 mmol/L (ref 98–111)
Creatinine, Ser: 0.81 mg/dL (ref 0.44–1.00)
GFR, Estimated: >60 mL/min (ref >60)
Glucose, Bld: 128 mg/dL — ABNORMAL HIGH (ref 70–99)
Potassium: 3.9 mmol/L (ref 3.5–5.1)
Sodium: 138 mmol/L (ref 135–145)
Total Bilirubin: 1 mg/dL (ref 0.3–1.2)
Total Protein: 6.6 g/dL (ref 6.5–8.1)

## 2023-03-22 MED ORDER — ACETAMINOPHEN 325 MG PO TABS: 650.0000 mg | ORAL_TABLET | ORAL | Status: DC

## 2023-03-22 MED ORDER — DIPHENHYDRAMINE HCL 25 MG PO CAPS: 25.0000 mg | ORAL_CAPSULE | ORAL | Status: DC

## 2023-03-22 MED ORDER — SODIUM CHLORIDE 0.9 % IV SOLN
3.0000 mg/kg | INTRAVENOUS | Status: AC
  Administered 2023-03-22: 200 mg via INTRAVENOUS
  Filled 2023-03-22: qty 20

## 2023-03-22 NOTE — Progress Notes (Signed)
CBC and CMP are normal.

## 2023-03-23 ENCOUNTER — Other Ambulatory Visit: Payer: Self-pay | Admitting: Pharmacist

## 2023-03-23 DIAGNOSIS — H209 Unspecified iridocyclitis: Secondary | ICD-10-CM

## 2023-03-23 DIAGNOSIS — Z79899 Other long term (current) drug therapy: Secondary | ICD-10-CM

## 2023-03-23 DIAGNOSIS — Z111 Encounter for screening for respiratory tuberculosis: Secondary | ICD-10-CM

## 2023-03-23 DIAGNOSIS — D869 Sarcoidosis, unspecified: Secondary | ICD-10-CM

## 2023-03-23 NOTE — Progress Notes (Signed)
Next infusion scheduled for Remicade on 05/03/23 and due for updated orders. Diagnosis: uveitis, sarcoidosis  Dose: 3mg /kg every 6 weeks  Last Clinic Visit: 11/23/22 Next Clinic Visit: 05/25/23  Last infusion: 03/22/23  Labs: CBC and CMP on 03/22/23 - stable TB Gold: negative on 06/29/22   Orders placed for Remicade x 2 doses along with premedication of acetaminophen and diphenhydramine to be administered 30 minutes before medication infusion.  Standing CBC with diff/platelet and CMP with GFR orders placed to be drawn every 3 months (every other infusion).  Next TB gold due Sept 2024. Order placed today.  Chesley Mires, PharmD, MPH, BCPS, CPP Clinical Pharmacist (Rheumatology and Pulmonology)

## 2023-04-13 ENCOUNTER — Other Ambulatory Visit: Payer: Self-pay | Admitting: Physician Assistant

## 2023-04-13 NOTE — Telephone Encounter (Signed)
Last Fill: 04/03/2022  Next Visit: 04/29/2023  Last Visit: 11/23/2022  Dx: Sarcoidosis   Current Dose per office note on 11/23/2022: folic acid 1 mg 2 tablets daily.   Okay to refill Folic Acid?

## 2023-04-14 ENCOUNTER — Ambulatory Visit (INDEPENDENT_AMBULATORY_CARE_PROVIDER_SITE_OTHER): Payer: Medicare Other

## 2023-04-14 VITALS — Ht 63.0 in | Wt 135.0 lb

## 2023-04-14 DIAGNOSIS — Z0000 Encounter for general adult medical examination without abnormal findings: Secondary | ICD-10-CM

## 2023-04-14 NOTE — Progress Notes (Signed)
Subjective:   Renee Jones is a 72 y.o. female who presents for Medicare Annual (Subsequent) preventive examination.  Visit Complete: Virtual  I connected with  [name] on 04/14/23 by a audio enabled telemedicine application and verified that I am speaking with the correct person using two identifiers.  Patient Location: Home  Provider Location: Office/Clinic  I discussed the limitations of evaluation and management by telemedicine. The patient expressed understanding and agreed to proceed.   Review of Systems      Cardiac Risk Factors include: advanced age (>104men, >86 women);dyslipidemia;hypertension     Objective:    Today's Vitals   04/14/23 0820  Weight: 135 lb (61.2 kg)  Height: 5\' 3"  (1.6 m)   Body mass index is 23.91 kg/m.     04/14/2023    8:27 AM 04/10/2022   10:37 AM 04/09/2021   10:39 AM 02/13/2020   12:12 PM 03/08/2015    3:09 PM 07/07/2014    5:57 PM  Advanced Directives  Does Patient Have a Medical Advance Directive? Yes No Yes Yes No No  Type of Estate agent of [place];Living will  Healthcare Power of [place];Living will Healthcare Power of [place];Living will    Copy of Healthcare Power of Attorney in Chart? No - copy requested  No - copy requested No - copy requested    Would patient like information on creating a medical advance directive?  No - Patient declined    No - patient declined information    Current Medications (verified) Outpatient Encounter Medications as of 04/14/2023  Medication Sig   albuterol (VENTOLIN HFA) 108 (90 Base) MCG/ACT inhaler Inhale 1-2 puffs into the lungs every 6 (six) hours as needed (cough).   amLODipine (NORVASC) 5 MG tablet Take 1 tablet (5 mg total) by mouth daily. for blood pressure.   benzonatate (TESSALON PERLES) 100 MG capsule 1-2 capsules up to twice daily as needed for cough.   cyclobenzaprine (FLEXERIL) 5 MG tablet Take 1 tablet (5 mg total) by mouth at bedtime as  needed for muscle spasms.   fluticasone (FLONASE) 50 MCG/ACT nasal spray Place 2 sprays into both nostrils daily as needed for allergies or rhinitis.   folic acid (FOLVITE) 1 MG tablet TAKE 2 TABLETS(2 MG) BY MOUTH DAILY   InFLIXimab (REMICADE IV) Inject into the vein every 6 (six) weeks.   methotrexate (RHEUMATREX) 2.5 MG tablet TAKE 8 TABLETS BY MOUTH ONCE A WEEK, PROTECT FROM LIGHT   metoprolol tartrate (LOPRESSOR) 50 MG tablet Take 1.5 tablets (75 mg total) by mouth 2 (two) times daily.   ondansetron (ZOFRAN) 4 MG tablet TAKE 1 TABLET BY MOUTH EVERY 6 HOURS AS NEEDED FOR NAUSEA OR VOMITING   pantoprazole (PROTONIX) 40 MG tablet TAKE 1 TABLET BY MOUTH ONCE DAILY   pantoprazole (PROTONIX) 40 MG tablet Take 1 tablet (40 mg total) by mouth 2 (two) times daily before a meal. For cough and heartburn   pravastatin (PRAVACHOL) 40 MG tablet Take 1 tablet (40 mg total) by mouth daily. for cholesterol.   SYSTANE ULTRA 0.4-0.3 % SOLN SMARTSIG:1 Drop(s) In Eye(s) 6 Times Daily   VITAMIN D PO Take 1,000 Units by mouth daily.   No facility-administered encounter medications on file as of 04/14/2023.    Allergies (verified) Codeine, Amoxicillin-pot clavulanate, Erythromycin base, Other, Sulfa antibiotics, Tramadol, Tramadol hcl, and Erythromycin   History: Past Medical History:  Diagnosis Date   Allergy    Asthma    Bronchial spasm 09/14/2016  DDD (degenerative disc disease), lumbar 09/14/2016   Detached retina 09/14/2016   Diastolic dysfunction 08/28/2015   Essential hypertension 08/28/2015   GERD (gastroesophageal reflux disease)    Glaucoma 09/14/2016   Neuromuscular disorder (HCC)    Osteoarthritis of both hands 09/14/2016   Osteoarthritis of both knees 09/14/2016   Pure hypercholesterolemia 02/20/2021   Sarcoidosis    with +lymph node biopsy   SVT (supraventricular tachycardia) 08/28/2015   Tachycardia    Uveitis    Past Surgical History:  Procedure Laterality Date   CATARACT  EXTRACTION, BILATERAL  2018   CYSTECTOMY     EYE SURGERY Bilateral 2018   cataract removal    LUMBAR SPINE SURGERY  2009   ROTATOR CUFF REPAIR Right 2011   ROTATOR CUFF REPAIR Left 06/2018   TONSILLECTOMY AND ADENOIDECTOMY  1962   TOTAL ABDOMINAL HYSTERECTOMY  1984   Family History  Adopted: Yes  Problem Relation Age of Onset   Scleroderma Daughter    Social History   Socioeconomic History   Marital status: Widowed    Spouse name: Not on file   Number of children: Not on file   Years of education: Not on file   Highest education level: Not on file  Occupational History   Not on file  Tobacco Use   Smoking status: Never   Smokeless tobacco: Never  Vaping Use   Vaping Use: Never used  Substance and Sexual Activity   Alcohol use: No    Alcohol/week: 0.0 standard drinks of alcohol   Drug use: No   Sexual activity: Yes  Other Topics Concern   Not on file  Social History Narrative   Widower.   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   Social Determinants of Health   Financial Resource Strain: Low Risk  (04/14/2023)   Overall Financial Resource Strain (CARDIA)    Difficulty of Paying Living Expenses: Not hard at all  Food Insecurity: No Food Insecurity (04/14/2023)   Hunger Vital Sign    Worried About Running Out of Food in the Last Year: Never true    Ran Out of Food in the Last Year: Never true  Transportation Needs: No Transportation Needs (04/14/2023)   PRAPARE - Administrator, Civil Service (Medical): No    Lack of Transportation (Non-Medical): No  Physical Activity: Sufficiently Active (04/14/2023)   Exercise Vital Sign    Days of Exercise per Week: 3 days    Minutes of Exercise per Session: 60 min  Stress: No Stress Concern Present (04/14/2023)   [employer] of Occupational Health - Occupational Stress Questionnaire    Feeling of Stress : Not at all  Social Connections: Moderately Integrated (04/14/2023)   Social Connection  and Isolation Panel [NHANES]    Frequency of Communication with Friends and Family: More than three times a week    Frequency of Social Gatherings with Friends and Family: More than three times a week    Attends Religious Services: More than 4 times per year    Active Member of [employer] or Organizations: Yes    Attends Banker Meetings: More than 4 times per year    Marital Status: Widowed    Tobacco Counseling Counseling given: Not Answered   Clinical Intake:  Pre-visit preparation completed: Yes  Pain : No/denies pain     BMI - recorded: 23.91 Nutritional Risks: None Diabetes: No  How often do you need to have someone help you when you  read instructions, pamphlets, or other written materials from your doctor or pharmacy?: 1 - Never  Interpreter Needed?: No  Information entered by :: C.Modest Draeger LPN   Activities of Daily Living    04/14/2023    8:29 AM 04/12/2023    4:57 PM  In your present state of health, do you have any difficulty performing the following activities:  Hearing? 0 0  Vision? 0 0  Difficulty concentrating or making decisions? 0 0  Walking or climbing stairs? 0 0  Dressing or bathing? 0 0  Doing errands, shopping? 0 0  Preparing Food and eating ? N N  Using the Toilet? N N  In the past six months, have you accidently leaked urine? N N  Do you have problems with loss of bowel control? N N  Managing your Medications? N N  Managing your Finances? N N  Housekeeping or managing your Housekeeping? N N    Patient Care Team: Doreene Nest, NP as PCP - General (Internal Medicine) [facility] Chilton Si, MD as Attending Physician (Cardiology)  Indicate any recent Medical Services you may have received from other than [facility] providers in the past year (date may be approximate).     Assessment:   This is a routine wellness examination for Renee Jones.  Hearing/Vision screen Hearing Screening - Comments:: No  issues Vision Screening - Comments:: Readers - Dr.Groat - UTD on exams  Dietary issues and exercise activities discussed:     Goals Addressed             This Visit's Progress    DIET - EAT MORE FRUITS AND VEGETABLES   On track    Patient Stated       Quit drinking soda.       Depression Screen    04/14/2023    8:26 AM 04/10/2022   10:35 AM 04/09/2021   10:40 AM 02/13/2020   12:13 PM  PHQ 2/9 Scores  PHQ - 2 Score 0 0 0 0  PHQ- 9 Score  0 0 0    Fall Risk    04/14/2023    8:28 AM 04/12/2023    4:57 PM 03/09/2023    2:59 PM 04/10/2022   10:37 AM 04/09/2021   10:39 AM  Fall Risk   Falls in the past year? 1 1 0 0 0  Number falls in past yr: 0 0 0 0 0  Injury with Fall? 1 1 0 0 0  Comment broke left foot      Risk for fall due to : Orthopedic patient;Impaired balance/gait  No Fall Risks No Fall Risks Medication side effect  Follow up Falls evaluation completed;Education provided;Falls prevention discussed  Falls evaluation completed Falls evaluation completed Falls evaluation completed;Falls prevention discussed    MEDICARE RISK AT HOME:  Medicare Risk at Home - 04/14/23 0830     Any stairs in or around the home? Yes    If so, are there any without handrails? No    Home free of loose throw rugs in walkways, pet beds, electrical cords, etc? Yes    Adequate lighting in your home to reduce risk of falls? Yes    Life alert? No    Use of a cane, walker or w/c? No    Grab bars in the bathroom? No    Shower chair or bench in shower? No    Elevated toilet seat or a handicapped toilet? No  TIMED UP AND GO:  Was the test performed?  No    Cognitive Function:    04/09/2021   10:41 AM 02/13/2020   12:16 PM  MMSE - Mini Mental State Exam  Orientation to time 5 5  Orientation to Place 5 5  Registration 3 3  Attention/ Calculation 5 5  Recall 3 3  Language- repeat 1 1        04/14/2023    8:31 AM 04/10/2022   10:39 AM  6CIT Screen  What Year? 0  points 0 points  What month? 0 points 0 points  What time? 0 points 0 points  Count back from 20 0 points 0 points  Months in reverse 0 points 0 points  Repeat phrase 0 points 0 points  Total Score 0 points 0 points    Immunizations Immunization History  Administered Date(s) Administered   DTaP 07/26/2010   Influenza Split 07/19/2014   Influenza,inj,Quad PF,6+ Mos 07/19/2016, 12/21/2018   Influenza-Unspecified 07/09/2019   Pneumococcal Conjugate-13 01/21/2015   Pneumococcal Polysaccharide-23 09/19/2012, 09/21/2012   Pneumococcal-Unspecified 07/26/2012, 01/18/2015   Td 03/31/2010   Tdap 05/07/2010   Varicella 09/19/2012   Zoster, Live 07/26/2012, 09/21/2012    TDAP status: Due, Education has been provided regarding the importance of this vaccine. Advised may receive this vaccine at local pharmacy or Health Dept. Aware to provide a copy of the vaccination record if obtained from local pharmacy or Health Dept. Verbalized acceptance and understanding.  Flu Vaccine status: Declined, Education has been provided regarding the importance of this vaccine but patient still declined. Advised may receive this vaccine at local pharmacy or Health Dept. Aware to provide a copy of the vaccination record if obtained from local pharmacy or Health Dept. Verbalized acceptance and understanding.  Pneumococcal vaccine status: Declined,  Education has been provided regarding the importance of this vaccine but patient still declined. Advised may receive this vaccine at local pharmacy or Health Dept. Aware to provide a copy of the vaccination record if obtained from local pharmacy or Health Dept. Verbalized acceptance and understanding.   Covid-19 vaccine status: Declined, Education has been provided regarding the importance of this vaccine but patient still declined. Advised may receive this vaccine at local pharmacy or Health Dept.or vaccine clinic. Aware to provide a copy of the vaccination record if  obtained from local pharmacy or Health Dept. Verbalized acceptance and understanding.  Qualifies for Shingles Vaccine? Yes   Zostavax completed No   Shingrix Completed?: No.    Education has been provided regarding the importance of this vaccine. Patient has been advised to call insurance company to determine out of pocket expense if they have not yet received this vaccine. Advised may also receive vaccine at local pharmacy or Health Dept. Verbalized acceptance and understanding.  Screening Tests Health Maintenance  Topic Date Due   Zoster Vaccines- Shingrix (1 of 2) Never done   Pneumonia Vaccine 69+ Years old (3 of 3 - PPSV23 or PCV20) 01/21/2020   DTaP/Tdap/Td (4 - Td or Tdap) 07/26/2020   INFLUENZA VACCINE  05/20/2023   Medicare Annual Wellness (AWV)  04/13/2024   MAMMOGRAM  08/31/2024   Colonoscopy  02/24/2027   DEXA SCAN  Completed   Hepatitis C Screening  Completed   HPV VACCINES  Aged Out   COVID-19 Vaccine  Discontinued    Health Maintenance  Health Maintenance Due  Topic Date Due   Zoster Vaccines- Shingrix (1 of 2) Never done   Pneumonia Vaccine 50+ Years old (3  of 3 - PPSV23 or PCV20) 01/21/2020   DTaP/Tdap/Td (4 - Td or Tdap) 07/26/2020    Colorectal cancer screening: Type of screening: Colonoscopy. Completed 02/23/17. Repeat every 10 years  Mammogram status: Completed 08/31/22. Repeat every year  Bone Density status: Ordered 11/23/22. Pt provided with contact info and advised to call to schedule appt.  Lung Cancer Screening: (Low Dose CT Chest recommended if Age 49-80 years, 20 pack-year currently smoking OR have quit w/in 15years.) does not qualify.   Lung Cancer Screening Referral: n/a  Additional Screening:  Hepatitis C Screening: does qualify; Completed 06/25/20  Vision Screening: Recommended annual ophthalmology exams for early detection of glaucoma and other disorders of the eye. Is the patient up to date with their annual eye exam?  Yes  Who is the  provider or what is the name of the office in which the patient attends annual eye exams? Dr.Groat  If pt is not established with a provider, would they like to be referred to a provider to establish care? Yes .   Dental Screening: Recommended annual dental exams for proper oral hygiene    Community Resource Referral / Chronic Care Management: CRR required this visit?  No   CCM required this visit?  No     Plan:     I have personally reviewed and noted the following in the patient's chart:   Medical and social history Use of alcohol, tobacco or illicit drugs  Current medications and supplements including opioid prescriptions. Patient is not currently taking opioid prescriptions. Functional ability and status Nutritional status Physical activity Advanced directives List of other physicians Hospitalizations, surgeries, and ER visits in previous 12 months Vitals Screenings to include cognitive, depression, and falls Referrals and appointments  In addition, I have reviewed and discussed with patient certain preventive protocols, quality metrics, and best practice recommendations. A written personalized care plan for preventive services as well as general preventive health recommendations were provided to patient.     Maryan Puls, LPN   [id number]/[id number]   After Visit Summary: (MyChart) Due to this being a telephonic visit, the after visit summary with patients personalized plan was offered to patient via MyChart   Nurse Notes: Vaccinations: declines all Influenza vaccine: recommend every Fall Pneumococcal vaccine: recommend once per lifetime Prevnar-20 Tdap vaccine: recommend every 10 years Shingles vaccine: recommend Shingrix which is 2 doses 2-6 months apart and over 90% effective     Covid-19: recommend 2 doses one month apart with a booster 6 months later

## 2023-04-14 NOTE — Patient Instructions (Signed)
[name] , Thank you for taking time to come for your Medicare Wellness Visit. I appreciate your ongoing commitment to your health goals. Please review the following plan we discussed and let me know if I can assist you in the future.   These are the goals we discussed:  Goals      DIET - EAT MORE FRUITS AND VEGETABLES     Patient Stated     02/13/2020, I will continue at least 3 days a week for 30 minutes or more.      Patient Stated     04/09/2021, I will continue to walk 3 days a week for 1 mile     Patient Stated     Quit drinking soda.        This is a list of the screening recommended for you and due dates:  Health Maintenance  Topic Date Due   Zoster (Shingles) Vaccine (1 of 2) Never done   Pneumonia Vaccine (3 of 3 - PPSV23 or PCV20) 01/21/2020   DTaP/Tdap/Td vaccine (4 - Td or Tdap) 07/26/2020   Flu Shot  05/20/2023   Medicare Annual Wellness Visit  04/13/2024   Mammogram  08/31/2024   Colon Cancer Screening  02/24/2027   DEXA scan (bone density measurement)  Completed   Hepatitis C Screening  Completed   HPV Vaccine  Aged Out   COVID-19 Vaccine  Discontinued    Advanced directives: Please bring a copy of your health care power of attorney and living will to the office to be added to your chart at your convenience.   Conditions/risks identified: Aim for 30 minutes of exercise or brisk walking, 6-8 glasses of water, and 5 servings of fruits and vegetables each day.   Next appointment: Follow up in one year for your annual wellness visit 04/17/24 @ 8:15 telephone   Preventive Care 65 Years and Older, Female Preventive care refers to lifestyle choices and visits with your health care provider that can promote health and wellness. What does preventive care include? A yearly physical exam. This is also called an annual well check. Dental exams once or twice a year. Routine eye exams. Ask your health care provider how often you should have your eyes  checked. Personal lifestyle choices, including: Daily care of your teeth and gums. Regular physical activity. Eating a healthy diet. Avoiding tobacco and drug use. Limiting alcohol use. Practicing safe sex. Taking low-dose aspirin every day. Taking vitamin and mineral supplements as recommended by your health care provider. What happens during an annual well check? The services and screenings done by your health care provider during your annual well check will depend on your age, overall health, lifestyle risk factors, and family history of disease. Counseling  Your health care provider may ask you questions about your: Alcohol use. Tobacco use. Drug use. Emotional well-being. Home and relationship well-being. Sexual activity. Eating habits. History of falls. Memory and ability to understand (cognition). Work and work Astronomer. Reproductive health. Screening  You may have the following tests or measurements: Height, weight, and BMI. Blood pressure. Lipid and cholesterol levels. These may be checked every 5 years, or more frequently if you are over 64 years old. Skin check. Lung cancer screening. You may have this screening every year starting at age 25 if you have a 30-pack-year history of smoking and currently smoke or have quit within the past 15 years. Fecal occult blood test (FOBT) of the stool. You may have this test every year starting  at age 58. Flexible sigmoidoscopy or colonoscopy. You may have a sigmoidoscopy every 5 years or a colonoscopy every 10 years starting at age 74. Hepatitis C blood test. Hepatitis B blood test. Sexually transmitted disease (STD) testing. Diabetes screening. This is done by checking your blood sugar (glucose) after you have not eaten for a while (fasting). You may have this done every 1-3 years. Bone density scan. This is done to screen for osteoporosis. You may have this done starting at age 35. Mammogram. This may be done every 1-2  years. Talk to your health care provider about how often you should have regular mammograms. Talk with your health care provider about your test results, treatment options, and if necessary, the need for more tests. Vaccines  Your health care provider may recommend certain vaccines, such as: Influenza vaccine. This is recommended every year. Tetanus, diphtheria, and acellular pertussis (Tdap, Td) vaccine. You may need a Td booster every 10 years. Zoster vaccine. You may need this after age 61. Pneumococcal 13-valent conjugate (PCV13) vaccine. One dose is recommended after age 56. Pneumococcal polysaccharide (PPSV23) vaccine. One dose is recommended after age 70. Talk to your health care provider about which screenings and vaccines you need and how often you need them. This information is not intended to replace advice given to you by your health care provider. Make sure you discuss any questions you have with your health care provider. Document Released: 11/01/2015 Document Revised: 06/24/2016 Document Reviewed: 08/06/2015 Elsevier Interactive Patient Education  2017 [employer] Prevention in the Home Falls can cause injuries. They can happen to people of all ages. There are many things you can do to make your home safe and to help prevent falls. What can I do on the outside of my home? Regularly fix the edges of walkways and driveways and fix any cracks. Remove anything that might make you trip as you walk through a door, such as a raised step or threshold. Trim any bushes or trees on the path to your home. Use bright outdoor lighting. Clear any walking paths of anything that might make someone trip, such as rocks or tools. Regularly check to see if handrails are loose or broken. Make sure that both sides of any steps have handrails. Any raised decks and porches should have guardrails on the edges. Have any leaves, snow, or ice cleared regularly. Use sand or salt on walking paths  during winter. Clean up any spills in your garage right away. This includes oil or grease spills. What can I do in the bathroom? Use night lights. Install grab bars by the toilet and in the tub and shower. Do not use towel bars as grab bars. Use non-skid mats or decals in the tub or shower. If you need to sit down in the shower, use a plastic, non-slip stool. Keep the floor dry. Clean up any water that spills on the floor as soon as it happens. Remove soap buildup in the tub or shower regularly. Attach bath mats securely with double-sided non-slip rug tape. Do not have throw rugs and other things on the floor that can make you trip. What can I do in the bedroom? Use night lights. Make sure that you have a light by your bed that is easy to reach. Do not use any sheets or blankets that are too big for your bed. They should not hang down onto the floor. Have a firm chair that has side arms. You can use this for support  while you get dressed. Do not have throw rugs and other things on the floor that can make you trip. What can I do in the kitchen? Clean up any spills right away. Avoid walking on wet floors. Keep items that you use a lot in easy-to-reach places. If you need to reach something above you, use a strong step stool that has a grab bar. Keep electrical cords out of the way. Do not use floor polish or wax that makes floors slippery. If you must use wax, use non-skid floor wax. Do not have throw rugs and other things on the floor that can make you trip. What can I do with my stairs? Do not leave any items on the stairs. Make sure that there are handrails on both sides of the stairs and use them. Fix handrails that are broken or loose. Make sure that handrails are as long as the stairways. Check any carpeting to make sure that it is firmly attached to the stairs. Fix any carpet that is loose or worn. Avoid having throw rugs at the top or bottom of the stairs. If you do have throw  rugs, attach them to the floor with carpet tape. Make sure that you have a light switch at the top of the stairs and the bottom of the stairs. If you do not have them, ask someone to add them for you. What else can I do to help prevent falls? Wear shoes that: Do not have high heels. Have rubber bottoms. Are comfortable and fit you well. Are closed at the toe. Do not wear sandals. If you use a stepladder: Make sure that it is fully opened. Do not climb a closed stepladder. Make sure that both sides of the stepladder are locked into place. Ask someone to hold it for you, if possible. Clearly mark and make sure that you can see: Any grab bars or handrails. First and last steps. Where the edge of each step is. Use tools that help you move around (mobility aids) if they are needed. These include: Canes. Walkers. Scooters. Crutches. Turn on the lights when you go into a dark area. Replace any light bulbs as soon as they burn out. Set up your furniture so you have a clear path. Avoid moving your furniture around. If any of your floors are uneven, fix them. If there are any pets around you, be aware of where they are. Review your medicines with your doctor. Some medicines can make you feel dizzy. This can increase your chance of falling. Ask your doctor what other things that you can do to help prevent falls. This information is not intended to replace advice given to you by your health care provider. Make sure you discuss any questions you have with your health care provider. Document Released: 08/01/2009 Document Revised: 03/12/2016 Document Reviewed: 11/09/2014 Elsevier Interactive Patient Education  2017 [employer].

## 2023-04-19 NOTE — Progress Notes (Deleted)
Office Visit Note  Patient: Renee Jones             Date of Birth: 1950/11/15           MRN: [id number]             PCP: Doreene Nest, NP Referring: Doreene Nest, NP Visit Date: 04/29/2023 Occupation: @GUAROCC @  Subjective:  No chief complaint on file.   History of Present Illness: Renee Jones is a 72 y.o. female ***     Activities of Daily Living:  Patient reports morning stiffness for *** {minute/hour:19697}.   Patient {ACTIONS;DENIES/REPORTS:21021675::"Denies"} nocturnal pain.  Difficulty dressing/grooming: {ACTIONS;DENIES/REPORTS:21021675::"Denies"} Difficulty climbing stairs: {ACTIONS;DENIES/REPORTS:21021675::"Denies"} Difficulty getting out of chair: {ACTIONS;DENIES/REPORTS:21021675::"Denies"} Difficulty using hands for taps, buttons, cutlery, and/or writing: {ACTIONS;DENIES/REPORTS:21021675::"Denies"}  No Rheumatology ROS completed.   PMFS History:  Patient Active Problem List   Diagnosis Date Noted   Persistent cough for 3 weeks or longer 03/09/2023   Cervical spondylosis 11/12/2021   Hyperlipidemia 02/20/2021   Prediabetes 12/21/2018   High risk medications (not anticoagulants) long-term use 01/13/2017   GERD (gastroesophageal reflux disease) 09/14/2016   Bronchial spasm 09/14/2016   Glaucoma 09/14/2016   Detached retina 09/14/2016   Osteoarthritis of both hands 09/14/2016   Osteoarthritis of both knees 09/14/2016   DDD (degenerative disc disease), lumbar 09/14/2016   Uveitis 06/22/2016   SVT (supraventricular tachycardia) 08/28/2015   Essential hypertension 08/28/2015   Diastolic dysfunction 08/28/2015   Sarcoidosis 09/30/2012   Pulmonary nodules 09/30/2012   Tachycardia 09/30/2012   Sarcoid arthropathy 09/30/2012    Past Medical History:  Diagnosis Date   Allergy    Asthma    Bronchial spasm 09/14/2016   DDD (degenerative disc disease), lumbar 09/14/2016   Detached retina 09/14/2016   Diastolic dysfunction  08/28/2015   Essential hypertension 08/28/2015   GERD (gastroesophageal reflux disease)    Glaucoma 09/14/2016   Neuromuscular disorder (HCC)    Osteoarthritis of both hands 09/14/2016   Osteoarthritis of both knees 09/14/2016   Pure hypercholesterolemia 02/20/2021   Sarcoidosis    with +lymph node biopsy   SVT (supraventricular tachycardia) 08/28/2015   Tachycardia    Uveitis     Family History  Adopted: Yes  Problem Relation Age of Onset   Scleroderma Daughter    Past Surgical History:  Procedure Laterality Date   CATARACT EXTRACTION, BILATERAL  2018   CYSTECTOMY     EYE SURGERY Bilateral 2018   cataract removal    LUMBAR SPINE SURGERY  2009   ROTATOR CUFF REPAIR Right 2011   ROTATOR CUFF REPAIR Left 06/2018   TONSILLECTOMY AND ADENOIDECTOMY  1962   TOTAL ABDOMINAL HYSTERECTOMY  1984   Social History   Social History Narrative   [employer].   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   Immunization History  Administered Date(s) Administered   DTaP 07/26/2010   Influenza Split 07/19/2014   Influenza,inj,Quad PF,6+ Mos 07/19/2016, 12/21/2018   Influenza-Unspecified 07/09/2019   Pneumococcal Conjugate-13 01/21/2015   Pneumococcal Polysaccharide-23 09/19/2012, 09/21/2012   Pneumococcal-Unspecified 07/26/2012, 01/18/2015   Td 03/31/2010   Tdap 05/07/2010   Varicella 09/19/2012   Zoster, Live 07/26/2012, 09/21/2012     Objective: Vital Signs: There were no vitals taken for this visit.   Physical Exam   Musculoskeletal Exam: ***  CDAI Exam: CDAI Score: -- Patient Global: --; Provider Global: -- Swollen: --; Tender: -- Joint Exam 04/29/2023   No joint exam has been documented for this visit  There is currently no information documented on the homunculus. Go to the Rheumatology activity and complete the homunculus joint exam.  Investigation: No additional findings.  Imaging: No results found.  Recent Labs: Lab Results  Component Value  Date   WBC 7.7 03/22/2023   HGB 13.9 03/22/2023   PLT 298 03/22/2023   NA 138 03/22/2023   K 3.9 03/22/2023   CL 106 03/22/2023   CO2 23 03/22/2023   GLUCOSE 128 (H) 03/22/2023   BUN 13 03/22/2023   CREATININE 0.81 03/22/2023   BILITOT 1.0 03/22/2023   ALKPHOS 63 03/22/2023   AST 19 03/22/2023   ALT 14 03/22/2023   PROT 6.6 03/22/2023   ALBUMIN 4.0 03/22/2023   CALCIUM 9.3 03/22/2023   GFRAA 79 05/20/2020   QFTBGOLD Negative 06/16/2017   QFTBGOLDPLUS Negative 06/29/2022    Speciality Comments: REMICADE 3 mg/kg x 6 weeks-ACY 06/17/20   Procedures:  No procedures performed Allergies: Codeine, Amoxicillin-pot clavulanate, Erythromycin base, Other, Sulfa antibiotics, Tramadol, Tramadol hcl, and Erythromycin   Assessment / Plan:     Visit Diagnoses: No diagnosis found.  Orders: No orders of the defined types were placed in this encounter.  No orders of the defined types were placed in this encounter.   Face-to-face time spent with patient was *** minutes. Greater than 50% of time was spent in counseling and coordination of care.  Follow-Up Instructions: No follow-ups on file.   Ellen Henri, CMA  Note - This record has been created using Animal nutritionist.  Chart creation errors have been sought, but may not always  have been located. Such creation errors do not reflect on  the standard of medical care.

## 2023-04-29 ENCOUNTER — Ambulatory Visit: Payer: Medicare Other | Admitting: Rheumatology

## 2023-04-29 DIAGNOSIS — Z8781 Personal history of (healed) traumatic fracture: Secondary | ICD-10-CM

## 2023-04-29 DIAGNOSIS — M19041 Primary osteoarthritis, right hand: Secondary | ICD-10-CM

## 2023-04-29 DIAGNOSIS — H209 Unspecified iridocyclitis: Secondary | ICD-10-CM

## 2023-04-29 DIAGNOSIS — K219 Gastro-esophageal reflux disease without esophagitis: Secondary | ICD-10-CM

## 2023-04-29 DIAGNOSIS — Z780 Asymptomatic menopausal state: Secondary | ICD-10-CM

## 2023-04-29 DIAGNOSIS — D869 Sarcoidosis, unspecified: Secondary | ICD-10-CM

## 2023-04-29 DIAGNOSIS — I471 Supraventricular tachycardia, unspecified: Secondary | ICD-10-CM

## 2023-04-29 DIAGNOSIS — M170 Bilateral primary osteoarthritis of knee: Secondary | ICD-10-CM

## 2023-04-29 DIAGNOSIS — R918 Other nonspecific abnormal finding of lung field: Secondary | ICD-10-CM

## 2023-04-29 DIAGNOSIS — M5136 Other intervertebral disc degeneration, lumbar region: Secondary | ICD-10-CM

## 2023-04-29 DIAGNOSIS — Z13820 Encounter for screening for osteoporosis: Secondary | ICD-10-CM

## 2023-04-29 DIAGNOSIS — I10 Essential (primary) hypertension: Secondary | ICD-10-CM

## 2023-04-29 DIAGNOSIS — Z9889 Other specified postprocedural states: Secondary | ICD-10-CM

## 2023-04-29 DIAGNOSIS — Z79899 Other long term (current) drug therapy: Secondary | ICD-10-CM

## 2023-05-03 ENCOUNTER — Ambulatory Visit (HOSPITAL_COMMUNITY)
Admission: RE | Admit: 2023-05-03 | Discharge: 2023-05-03 | Disposition: A | Discharge status: Home / Self Care | Payer: Medicare Other | Source: Ambulatory Visit | Attending: Rheumatology | Admitting: Rheumatology

## 2023-05-03 DIAGNOSIS — D869 Sarcoidosis, unspecified: Secondary | ICD-10-CM | POA: Diagnosis not present

## 2023-05-03 DIAGNOSIS — H209 Unspecified iridocyclitis: Secondary | ICD-10-CM | POA: Diagnosis not present

## 2023-05-03 DIAGNOSIS — Z79899 Other long term (current) drug therapy: Secondary | ICD-10-CM | POA: Diagnosis not present

## 2023-05-03 MED ORDER — DIPHENHYDRAMINE HCL 25 MG PO CAPS: 25.0000 mg | ORAL_CAPSULE | ORAL | Status: DC

## 2023-05-03 MED ORDER — SODIUM CHLORIDE 0.9 % IV SOLN
3.0000 mg/kg | INTRAVENOUS | Status: DC
  Administered 2023-05-03: 200 mg via INTRAVENOUS
  Filled 2023-05-03: qty 20

## 2023-05-03 MED ORDER — ACETAMINOPHEN 325 MG PO TABS: 650.0000 mg | ORAL_TABLET | ORAL | Status: DC

## 2023-05-11 NOTE — Progress Notes (Unsigned)
Office Visit Note  Patient: Renee Jones             Date of Birth: January 25, 1951           MRN: [id number]             PCP: Doreene Nest, NP Referring: Doreene Nest, NP Visit Date: 05/25/2023 Occupation: @GUAROCC @  Subjective:  Pain in right hand and left knee   History of Present Illness: Renee Jones is a 72 y.o. female with history of uveitis and sarcoidosis.  Patient remains on Remicade IV infusion 3 mg/kg every 6 weeks, methotrexate 2.5 mg 8 tablets by mouth once weekly every 7 days, and folic acid 1 mg 2 tablets daily.  She has not had any interruptions in therapy recently.  Patient denies any signs or symptoms of a uveitis flare.  She has had ongoing eye dryness and has been using Systane eyedrops and ointment at night.  Patient states that she was experiencing severe cough intermittently which was felt to be related to reflux.  Her dose of Protonix was increased by her PCP which alleviated her symptoms.  She denies any new or worsening pulmonary symptoms recently.  She has been experiencing increased pain in the right [place] joint as well as intermittent discomfort in the left knee joint.  She has been using topical agents and taking Tylenol as needed for pain relief.   Activities of Daily Living:  Patient reports morning stiffness for a few minutes.   Patient Reports nocturnal pain.  Difficulty dressing/grooming: Denies Difficulty climbing stairs: Denies Difficulty getting out of chair: Denies Difficulty using hands for taps, buttons, cutlery, and/or writing: Denies  Review of Systems  Constitutional:  Negative for fatigue.  HENT:  Negative for mouth sores and mouth dryness.   Eyes:  Negative for dryness.  Respiratory:  Negative for shortness of breath.   Cardiovascular:  Negative for chest pain and palpitations.  Gastrointestinal:  Positive for constipation. Negative for blood in stool and diarrhea.  Endocrine: Negative for increased urination.   Genitourinary:  Negative for involuntary urination.  Musculoskeletal:  Positive for joint pain, joint pain, joint swelling and morning stiffness. Negative for gait problem, myalgias, muscle weakness, muscle tenderness and myalgias.  Skin:  Negative for color change, rash, hair loss and sensitivity to sunlight.  Allergic/Immunologic: Negative for susceptible to infections.  Neurological:  Negative for dizziness and headaches.  Hematological:  Negative for swollen glands.  Psychiatric/Behavioral:  Positive for sleep disturbance. Negative for depressed mood. The patient is nervous/anxious.     PMFS History:  Patient Active Problem List   Diagnosis Date Noted   Persistent cough for 3 weeks or longer 03/09/2023   Cervical spondylosis 11/12/2021   Hyperlipidemia 02/20/2021   Prediabetes 12/21/2018   High risk medications (not anticoagulants) long-term use 01/13/2017   GERD (gastroesophageal reflux disease) 09/14/2016   Bronchial spasm 09/14/2016   Glaucoma 09/14/2016   Detached retina 09/14/2016   Osteoarthritis of both hands 09/14/2016   Osteoarthritis of both knees 09/14/2016   DDD (degenerative disc disease), lumbar 09/14/2016   Uveitis 06/22/2016   SVT (supraventricular tachycardia) 08/28/2015   Essential hypertension 08/28/2015   Diastolic dysfunction 08/28/2015   Sarcoidosis 09/30/2012   Pulmonary nodules 09/30/2012   Tachycardia 09/30/2012   Sarcoid arthropathy 09/30/2012    Past Medical History:  Diagnosis Date   Allergy    Asthma    Bronchial spasm 09/14/2016   DDD (degenerative disc disease), lumbar 09/14/2016   Detached retina  09/14/2016   Diastolic dysfunction 08/28/2015   Essential hypertension 08/28/2015   GERD (gastroesophageal reflux disease)    Glaucoma 09/14/2016   Neuromuscular disorder (HCC)    Osteoarthritis of both hands 09/14/2016   Osteoarthritis of both knees 09/14/2016   Pure hypercholesterolemia 02/20/2021   Sarcoidosis    with +lymph node biopsy    SVT (supraventricular tachycardia) 08/28/2015   Tachycardia    Uveitis     Family History  Adopted: Yes  Problem Relation Age of Onset   Scleroderma Daughter    Past Surgical History:  Procedure Laterality Date   CATARACT EXTRACTION, BILATERAL  2018   CYSTECTOMY     EYE SURGERY Bilateral 2018   cataract removal    LUMBAR SPINE SURGERY  2009   ROTATOR CUFF REPAIR Right 2011   ROTATOR CUFF REPAIR Left 06/2018   TONSILLECTOMY AND ADENOIDECTOMY  1962   TOTAL ABDOMINAL HYSTERECTOMY  1984   Social History   Social History Narrative   [place].   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   Immunization History  Administered Date(s) Administered   DTaP 07/26/2010   Influenza Split 07/19/2014   Influenza,inj,Quad PF,6+ Mos 07/19/2016, 12/21/2018   Influenza-Unspecified 07/09/2019   Pneumococcal Conjugate-13 01/21/2015   Pneumococcal Polysaccharide-23 09/19/2012, 09/21/2012   Pneumococcal-Unspecified 07/26/2012, 01/18/2015   Td 03/31/2010   Tdap 05/07/2010   Varicella 09/19/2012   Zoster, Live 07/26/2012, 09/21/2012     Objective: Vital Signs: BP 119/69 (BP Location: Left Arm, Patient Position: Sitting, Cuff Size: Normal)   Pulse 69   Resp 14   Ht 5\' 3"  (1.6 m)   Wt 135 lb 9.6 oz (61.5 kg)   BMI 24.02 kg/m    Physical Exam Vitals and nursing note reviewed.  Constitutional:      Appearance: She is well-developed.  HENT:     Head: Normocephalic and atraumatic.  Eyes:     Conjunctiva/sclera: Conjunctivae normal.  Cardiovascular:     Rate and Rhythm: Normal rate and regular rhythm.     Heart sounds: Normal heart sounds.  Pulmonary:     Effort: Pulmonary effort is normal.     Breath sounds: Normal breath sounds.  Abdominal:     General: Bowel sounds are normal.     Palpations: Abdomen is soft.  Musculoskeletal:     Cervical back: Normal range of motion.  Lymphadenopathy:     Cervical: No cervical adenopathy.  Skin:    General: Skin is warm and  dry.     Capillary Refill: Capillary refill takes less than 2 seconds.  Neurological:     Mental Status: She is alert and oriented to person, place, and time.  Psychiatric:        Behavior: Behavior normal.      Musculoskeletal Exam: C-spine, thoracic spine, and lumbar spine good ROM.  Shoulder joints, elbow joints, wrist joints, MCPs, PIPs, and DIPs good ROM with no synovitis.  Tenderness over the right CMC joint.  Complete fist formation bilaterally.  Hip joints have good ROM with no groin pain.  Knee joints have good ROM with no warmth or effusion.  Ankle joints have good ROM with no tenderness or joint swelling.   CDAI Exam: CDAI Score: -- Patient Global: --; Provider Global: -- Swollen: --; Tender: -- Joint Exam 05/25/2023   No joint exam has been documented for this visit   There is currently no information documented on the homunculus. Go to the Rheumatology activity and complete the homunculus joint  exam.  Investigation: No additional findings.  Imaging: No results found.  Recent Labs: Lab Results  Component Value Date   WBC 7.7 03/22/2023   HGB 13.9 03/22/2023   PLT 298 03/22/2023   NA 138 03/22/2023   K 3.9 03/22/2023   CL 106 03/22/2023   CO2 23 03/22/2023   GLUCOSE 128 (H) 03/22/2023   BUN 13 03/22/2023   CREATININE 0.81 03/22/2023   BILITOT 1.0 03/22/2023   ALKPHOS 63 03/22/2023   AST 19 03/22/2023   ALT 14 03/22/2023   PROT 6.6 03/22/2023   ALBUMIN 4.0 03/22/2023   CALCIUM 9.3 03/22/2023   GFRAA 79 05/20/2020   QFTBGOLD Negative 06/16/2017   QFTBGOLDPLUS Negative 06/29/2022    Speciality Comments: REMICADE 3 mg/kg x 6 weeks-ACY 06/17/20   Procedures:  No procedures performed Allergies: Codeine, Amoxicillin-pot clavulanate, Erythromycin base, Other, Sulfa antibiotics, Tramadol, Tramadol hcl, and Erythromycin    Assessment / Plan:     Visit Diagnoses: Uveitis - [facility]: She has not had any signs or symptoms of uveitis flare.  No  conjunctival injection noted on examination today.  She has not had any eye pain or photophobia.  She has been experiencing chronic dry eyes and has been using Systane eyedrops as well as a gel lubricating ointment at bedtime.  Patient discontinued Restasis due to cost. Patient will remain on IV Remicade infusions and methotrexate as prescribed.  She is advised to notify us if she starts to have recurrent flares of uveitis.  Sarcoidosis - Chest x-ray updated on 09/16/2021 did not reveal any active cardiopulmonary disease.  Chest x-ray updated on 03/09/2023: No acute intrathoracic process noted.  No new or worsening pulmonary symptoms at this time.  No signs of inflammatory arthritis.  No new skin rashes or lesions.  Patient will remain on IV Remicade and methotrexate as prescribed.  She was advised to notify us if she develops any new or worsening symptoms.  High risk medications (not anticoagulants) long-term use - Remicade IV infusion 3 mg/kg every 6 weeks, methotrexate 2.5 mg 8 tablets by mouth once weekly every 7 days, and folic acid 1 mg 2 tablets daily.  Her next infusion is scheduled on 06/14/2023. CBC and CMP updated on 03/22/23.  She will be having updated lab work with her next infusion on 06/14/23.  TB gold negative on 06/29/22. Future order for TB gold placed today.  No recent or recurrent infections.  Discussed the importance of holding Remicade and methotrexate if she develops signs or symptoms of an infection and to resume once the infection is completely cleared. - Plan: QuantiFERON-TB Gold Plus  Screening for tuberculosis - Future order for TB gold placed today. Plan: QuantiFERON-TB Gold Plus  Primary osteoarthritis of left knee -X-rays of the left knee from 07/16/2023 were consistent with mild osteoarthritis and moderate chondromalacia patella.  She continues to have intermittent discomfort in the left knee joint.  She is not currently symptomatic but when she is having increased discomfort  she describes it as a throbbing sensation in the left knee.  On examination today she has good range of motion of both knee joints with no warmth or effusion.  She underwent viscosupplementation for the left knee in April/May 2024 which was helpful but she continues to have intermittent discomfort.  She would like to reapply for viscosupplementation when she has eligible again.   This patient is diagnosed with osteoarthritis of the knee(s).    Radiographs show evidence of joint space narrowing, osteophytes, subchondral  sclerosis and/or subchondral cysts.  This patient has knee pain which interferes with functional and activities of daily living.    This patient has experienced inadequate response, adverse effects and/or intolerance with conservative treatments such as acetaminophen, NSAIDS, topical creams, physical therapy or regular exercise, knee bracing and/or weight loss.   This patient has experienced inadequate response or has a contraindication to intra articular steroid injections for at least 3 months.   This patient is not scheduled to have a total knee replacement within 6 months of starting treatment with viscosupplementation.  Pulmonary nodules  S/P left rotator cuff repair: Doing well.    History of repair of right rotator cuff: Doing well.   Primary osteoarthritis of both hands: Patient presents today with increased pain on the right CMC joint.  She has tenderness over the right [facility]: [place] W joint on examination today.   No MCP synovitis noted. Discussed that she may benefit from using a right CMC joint brace.  She was given a prescription for a right CMC joint brace.  Also discussed the use of arthritis gloves.  Discussed the importance of joint protection and muscle strengthening.  Right hand paresthesia: Not currently symptomatic.   Primary osteoarthritis of both knees: She has good range of motion of both knee joints on examination today.  No warmth or effusion noted.  Patient  continues to have intermittent discomfort in the left knee.  Patient had viscosupplementation in the left knee in April/May 2024 which provided relief but she has continued to have intermittent discomfort.  Patient plans on having [place] reapply for viscosupplementation when she is eligible in the future.  DDD (degenerative disc disease), lumbar - Dr. Shon Baton  Osteoporosis screening - Previous DEXA on 11/01/2020: Right femoral neck BMD 0.74 with T-score -1.0.  Patient had a recent fall in October 2023 and fractured the fifth metatarsal.  No recent falls.  DEXA updated on 05/21/2023: Right femoral neck BMD 0.750 with T-score -0.9.  Results discussed with the patient today in the office.  She has been taking vitamin D 5000 units daily since her foot fracture.  She will be having her vitamin D checked at her upcoming appointment with her PCP on 06/02/2023.  Patient was given an informational handout about vitamin D and calcium supplementation.  She has been obtaining calcium through her diet.  Discussed the importance of resistive exercises.  Recommended repeating her bone density in 2 years instead of 5 years for close monitoring.  Other medical conditions are listed as follows:   SVT (supraventricular tachycardia)  Essential hypertension: Blood pressure is 119/69 today in the office.  Gastroesophageal reflux disease without esophagitis: Patient's dose of Protonix was increased to 40 mg daily which has been helpful in alleviating the cough she was experiencing after eating.    Orders: Orders Placed This Encounter  Procedures   QuantiFERON-TB Gold Plus   Meds ordered this encounter  Medications   ondansetron (ZOFRAN) 4 MG tablet    Sig: Take 1 tablet (4 mg total) by mouth every 6 (six) hours as needed.    Dispense:  30 tablet    Refill:  0     Follow-Up Instructions: Return in about 5 months (around 10/25/2023) for Uveitis, Sarcoidosis.   Renee Bienenstock, PA-C  Note - This record has been  created using Dragon software.  Chart creation errors have been sought, but may not always  have been located. Such creation errors do not reflect on  the standard of medical care.

## 2023-05-19 ENCOUNTER — Other Ambulatory Visit: Payer: Self-pay | Admitting: Rheumatology

## 2023-05-19 ENCOUNTER — Encounter (INDEPENDENT_AMBULATORY_CARE_PROVIDER_SITE_OTHER): Payer: Self-pay

## 2023-05-19 NOTE — Telephone Encounter (Signed)
Last Fill: 02/19/2023  Labs: 03/22/2023 CBC and CMP are normal.   Next Visit: 05/25/2023  Last Visit: 11/23/2022  DX:  Uveitis   Current Dose per office note 11/23/2022: methotrexate 8 tablets by mouth once weekly.   Okay to refill Methotrexate?

## 2023-05-21 DIAGNOSIS — I471 Supraventricular tachycardia, unspecified: Secondary | ICD-10-CM | POA: Diagnosis not present

## 2023-05-21 DIAGNOSIS — N958 Other specified menopausal and perimenopausal disorders: Secondary | ICD-10-CM | POA: Diagnosis not present

## 2023-05-21 DIAGNOSIS — E785 Hyperlipidemia, unspecified: Secondary | ICD-10-CM | POA: Diagnosis not present

## 2023-05-21 DIAGNOSIS — Z8781 Personal history of (healed) traumatic fracture: Secondary | ICD-10-CM | POA: Diagnosis not present

## 2023-05-21 DIAGNOSIS — I10 Essential (primary) hypertension: Secondary | ICD-10-CM | POA: Diagnosis not present

## 2023-05-25 ENCOUNTER — Encounter: Payer: Self-pay | Admitting: Physician Assistant

## 2023-05-25 ENCOUNTER — Ambulatory Visit: Payer: Medicare Other | Attending: Rheumatology | Admitting: Physician Assistant

## 2023-05-25 VITALS — BP 119/69 | HR 69 | Resp 14 | Ht 63.0 in | Wt 135.6 lb

## 2023-05-25 DIAGNOSIS — Z111 Encounter for screening for respiratory tuberculosis: Secondary | ICD-10-CM | POA: Insufficient documentation

## 2023-05-25 DIAGNOSIS — M1712 Unilateral primary osteoarthritis, left knee: Secondary | ICD-10-CM | POA: Insufficient documentation

## 2023-05-25 DIAGNOSIS — H209 Unspecified iridocyclitis: Secondary | ICD-10-CM | POA: Diagnosis not present

## 2023-05-25 DIAGNOSIS — M5136 Other intervertebral disc degeneration, lumbar region: Secondary | ICD-10-CM | POA: Insufficient documentation

## 2023-05-25 DIAGNOSIS — Z13820 Encounter for screening for osteoporosis: Secondary | ICD-10-CM | POA: Insufficient documentation

## 2023-05-25 DIAGNOSIS — Z9889 Other specified postprocedural states: Secondary | ICD-10-CM | POA: Diagnosis not present

## 2023-05-25 DIAGNOSIS — I471 Supraventricular tachycardia, unspecified: Secondary | ICD-10-CM | POA: Insufficient documentation

## 2023-05-25 DIAGNOSIS — M170 Bilateral primary osteoarthritis of knee: Secondary | ICD-10-CM | POA: Diagnosis not present

## 2023-05-25 DIAGNOSIS — R202 Paresthesia of skin: Secondary | ICD-10-CM | POA: Insufficient documentation

## 2023-05-25 DIAGNOSIS — I10 Essential (primary) hypertension: Secondary | ICD-10-CM | POA: Insufficient documentation

## 2023-05-25 DIAGNOSIS — M19041 Primary osteoarthritis, right hand: Secondary | ICD-10-CM | POA: Insufficient documentation

## 2023-05-25 DIAGNOSIS — D869 Sarcoidosis, unspecified: Secondary | ICD-10-CM | POA: Insufficient documentation

## 2023-05-25 DIAGNOSIS — K219 Gastro-esophageal reflux disease without esophagitis: Secondary | ICD-10-CM | POA: Insufficient documentation

## 2023-05-25 DIAGNOSIS — M19042 Primary osteoarthritis, left hand: Secondary | ICD-10-CM | POA: Diagnosis not present

## 2023-05-25 DIAGNOSIS — R918 Other nonspecific abnormal finding of lung field: Secondary | ICD-10-CM | POA: Insufficient documentation

## 2023-05-25 DIAGNOSIS — Z79899 Other long term (current) drug therapy: Secondary | ICD-10-CM | POA: Diagnosis not present

## 2023-05-25 MED ORDER — ONDANSETRON HCL 4 MG PO TABS: 4.0000 mg | ORAL_TABLET | Freq: Four times a day (QID) | ORAL | 0 refills | Status: AC | PRN

## 2023-06-02 ENCOUNTER — Encounter: Payer: Self-pay | Admitting: Primary Care

## 2023-06-02 ENCOUNTER — Ambulatory Visit (INDEPENDENT_AMBULATORY_CARE_PROVIDER_SITE_OTHER): Payer: Medicare Other | Admitting: Primary Care

## 2023-06-02 VITALS — BP 120/72 | HR 60 | Temp 97.6°F | Ht 63.0 in | Wt 133.0 lb

## 2023-06-02 DIAGNOSIS — M19042 Primary osteoarthritis, left hand: Secondary | ICD-10-CM

## 2023-06-02 DIAGNOSIS — M19041 Primary osteoarthritis, right hand: Secondary | ICD-10-CM | POA: Diagnosis not present

## 2023-06-02 DIAGNOSIS — K219 Gastro-esophageal reflux disease without esophagitis: Secondary | ICD-10-CM

## 2023-06-02 DIAGNOSIS — M170 Bilateral primary osteoarthritis of knee: Secondary | ICD-10-CM | POA: Diagnosis not present

## 2023-06-02 DIAGNOSIS — M5136 Other intervertebral disc degeneration, lumbar region: Secondary | ICD-10-CM | POA: Diagnosis not present

## 2023-06-02 DIAGNOSIS — D869 Sarcoidosis, unspecified: Secondary | ICD-10-CM

## 2023-06-02 DIAGNOSIS — I10 Essential (primary) hypertension: Secondary | ICD-10-CM | POA: Diagnosis not present

## 2023-06-02 DIAGNOSIS — R053 Chronic cough: Secondary | ICD-10-CM

## 2023-06-02 DIAGNOSIS — R000 Tachycardia, unspecified: Secondary | ICD-10-CM

## 2023-06-02 DIAGNOSIS — I471 Supraventricular tachycardia, unspecified: Secondary | ICD-10-CM | POA: Diagnosis not present

## 2023-06-02 DIAGNOSIS — E7800 Pure hypercholesterolemia, unspecified: Secondary | ICD-10-CM | POA: Diagnosis not present

## 2023-06-02 DIAGNOSIS — H409 Unspecified glaucoma: Secondary | ICD-10-CM

## 2023-06-02 DIAGNOSIS — R7303 Prediabetes: Secondary | ICD-10-CM

## 2023-06-02 LAB — LIPID PANEL
Cholesterol: 159 mg/dL (ref 0–200)
HDL: 49.2 mg/dL (ref >39.00)
LDL Cholesterol: 82 mg/dL (ref 0–99)
NonHDL: 109.76
Total CHOL/HDL Ratio: 3
Triglycerides: 138 mg/dL (ref 0.0–149.0)
VLDL: 27.6 mg/dL (ref 0.0–40.0)

## 2023-06-02 LAB — HEMOGLOBIN A1C: Hgb A1c MFr Bld: 6.3 % (ref 4.6–6.5)

## 2023-06-02 NOTE — Assessment & Plan Note (Signed)
 Controlled.  Following with cardiology. Continue metoprolol tartrate 12.5 mg BID

## 2023-06-02 NOTE — Assessment & Plan Note (Signed)
Controlled. Continue amlodipine 5mg daily.

## 2023-06-02 NOTE — Assessment & Plan Note (Signed)
Following with rheumatology.  Office notes reviewed from August 2024.  Continue methotrexate 20 mg weekly, Remicade infusions every 6 weeks, folic acid daily as prescribed.

## 2023-06-02 NOTE — Assessment & Plan Note (Signed)
Stable.  Continue cyclobenzaprine 5 mg as needed for which she uses sparingly.

## 2023-06-02 NOTE — Assessment & Plan Note (Signed)
Repeat A1c pending. 

## 2023-06-02 NOTE — Assessment & Plan Note (Signed)
Controlled.  No longer following with pulmonology due to her stability. Continue to monitor.  She declines Prevnar 20 vaccine today.

## 2023-06-02 NOTE — Assessment & Plan Note (Signed)
Stable.  Continue regular visits with [facility].

## 2023-06-02 NOTE — Progress Notes (Signed)
Subjective:    Patient ID: Renee Jones, female    DOB: 1951/04/02, 72 y.o.   MRN: [id number]  HPI  Renee Jones is a very pleasant 72 y.o. female with a history of hypertension, pulmonary nodules, osteoarthritis, sarcoidosis, glaucoma, prediabetes, hyperlipidemia, diastolic dysfunction degenerative disc disease of lumbar spine who presents today for follow-up of chronic conditions.  Immunizations: -Tetanus: Completed in 2011 -Shingles: Completed Zostavax -Pneumonia: Completed Prevnar 13 in 2016, Pneumovax 23 in 2013. Declines Prevnar 20.  Mammogram: Completed in November 2023 Bone Density Scan: Completed in 2024  Colonoscopy: Completed 3-4 years ago per patient. Completed at [facility].   1) Osteoarthritis: Currently managed on methotrexate 20 mg weekly, Remicade infusions every 6 weeks, folic acid daily,.  Following with rheumatology, last office visit was last week.  No changes were made to her regimen.  She is also managed on cyclobenzaprine 5 mg PRN for DDD to lumbar spine.  2) Hypertension: Currently managed on amlodipine 5 mg daily. She denies chest pain, dizziness, headaches.   BP Readings from Last 3 Encounters:  06/02/23 120/72  05/25/23 119/69  05/03/23 133/74   3) Glaucoma/Uveitis: Following with [facility]. Eye exam is up to date. Not managed on medications.   4) GERD/Chronic Cough: Currently managed on pantoprazole 40 mg daily which has helped tremendously with her cough and reflux. Follows with GI through [facility].     Review of Systems  Respiratory:  Negative for cough and shortness of breath.   Cardiovascular:  Negative for chest pain.  Gastrointestinal:  Negative for constipation and diarrhea.  Neurological:  Negative for dizziness and headaches.  Psychiatric/Behavioral:  The patient is not nervous/anxious.          Past Medical History:  Diagnosis Date   Allergy    Asthma    Bronchial spasm 09/14/2016   DDD (degenerative disc  disease), lumbar 09/14/2016   Detached retina 09/14/2016   Diastolic dysfunction 08/28/2015   Essential hypertension 08/28/2015   GERD (gastroesophageal reflux disease)    Glaucoma 09/14/2016   Neuromuscular disorder (HCC)    Osteoarthritis of both hands 09/14/2016   Osteoarthritis of both knees 09/14/2016   Pulmonary nodules 09/30/2012   Pure hypercholesterolemia 02/20/2021   Sarcoidosis    with +lymph node biopsy   SVT (supraventricular tachycardia) 08/28/2015   Tachycardia    Uveitis     Social History   Socioeconomic History   Marital status: Widowed    Spouse name: Not on file   Number of children: Not on file   Years of education: Not on file   Highest education level: Not on file  Occupational History   Not on file  Tobacco Use   Smoking status: Never    Passive exposure: Never   Smokeless tobacco: Never  Vaping Use   Vaping status: Never Used  Substance and Sexual Activity   Alcohol use: No    Alcohol/week: 0.0 standard drinks of alcohol   Drug use: No   Sexual activity: Yes  Other Topics Concern   Not on file  Social History Narrative   Widower.   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   Social Determinants of Health   Financial Resource Strain: Low Risk  (04/14/2023)   Overall Financial Resource Strain (CARDIA)    Difficulty of Paying Living Expenses: Not hard at all  Food Insecurity: No Food Insecurity (04/14/2023)   Hunger Vital Sign    Worried About Running Out of Food  in the Last Year: Never true    Ran Out of Food in the Last Year: Never true  Transportation Needs: No Transportation Needs (04/14/2023)   PRAPARE - Administrator, Civil Service (Medical): No    Lack of Transportation (Non-Medical): No  Physical Activity: Sufficiently Active (04/14/2023)   Exercise Vital Sign    Days of Exercise per Week: 3 days    Minutes of Exercise per Session: 60 min  Stress: No Stress Concern Present (04/14/2023)   [employer] of Occupational Health - Occupational Stress Questionnaire    Feeling of Stress : Not at all  Social Connections: Moderately Integrated (04/14/2023)   Social Connection and Isolation Panel [NHANES]    Frequency of Communication with Friends and Family: More than three times a week    Frequency of Social Gatherings with Friends and Family: More than three times a week    Attends Religious Services: More than 4 times per year    Active Member of [employer] or Organizations: Yes    Attends Banker Meetings: More than 4 times per year    Marital Status: Widowed  Intimate Partner Violence: Not At Risk (04/14/2023)   Humiliation, Afraid, Rape, and Kick questionnaire    Fear of Current or Ex-Partner: No    Emotionally Abused: No    Physically Abused: No    Sexually Abused: No    Past Surgical History:  Procedure Laterality Date   CATARACT EXTRACTION, BILATERAL  2018   CYSTECTOMY     EYE SURGERY Bilateral 2018   cataract removal    LUMBAR SPINE SURGERY  2009   ROTATOR CUFF REPAIR Right 2011   ROTATOR CUFF REPAIR Left 06/2018   TONSILLECTOMY AND ADENOIDECTOMY  1962   TOTAL ABDOMINAL HYSTERECTOMY  1984    Family History  Adopted: Yes  Problem Relation Age of Onset   Scleroderma Daughter     Allergies  Allergen Reactions   Codeine Itching and Hives    Other reaction(s): itching internally   Amoxicillin-Pot Clavulanate Diarrhea   Erythromycin Base    Other     Other reaction(s): Unknown Other reaction(s): itching internally   Sulfa Antibiotics Itching     vomiting   Tramadol Itching   Tramadol Hcl     Other reaction(s): hives   Erythromycin Itching and Hives    Other reaction(s): itching internally    Current Outpatient Medications on File Prior to Visit  Medication Sig Dispense Refill   albuterol (VENTOLIN HFA) 108 (90 Base) MCG/ACT inhaler Inhale 1-2 puffs into the lungs every 6 (six) hours as needed (cough). 6.7 g 0   amLODipine (NORVASC) 5 MG tablet  Take 1 tablet (5 mg total) by mouth daily. for blood pressure. 90 tablet 2   benzonatate (TESSALON PERLES) 100 MG capsule 1-2 capsules up to twice daily as needed for cough. 30 capsule 0   cyclobenzaprine (FLEXERIL) 5 MG tablet Take 1 tablet (5 mg total) by mouth at bedtime as needed for muscle spasms. 30 tablet 0   fluticasone (FLONASE) 50 MCG/ACT nasal spray Place 2 sprays into both nostrils daily as needed for allergies or rhinitis.     folic acid (FOLVITE) 1 MG tablet TAKE 2 TABLETS(2 MG) BY MOUTH DAILY 180 tablet 3   InFLIXimab (REMICADE IV) Inject into the vein every 6 (six) weeks.     methotrexate (RHEUMATREX) 2.5 MG tablet TAKE 8 TABLETS BY MOUTH ONCE A WEEK PROTECT FROM LIGHT 96 tablet 0   metoprolol tartrate (  LOPRESSOR) 50 MG tablet Take 1.5 tablets (75 mg total) by mouth 2 (two) times daily. 270 tablet 0   ondansetron (ZOFRAN) 4 MG tablet Take 1 tablet (4 mg total) by mouth every 6 (six) hours as needed. 30 tablet 0   pantoprazole (PROTONIX) 40 MG tablet TAKE 1 TABLET BY MOUTH ONCE DAILY 90 tablet 1   pravastatin (PRAVACHOL) 40 MG tablet Take 1 tablet (40 mg total) by mouth daily. for cholesterol. 90 tablet 0   SYSTANE ULTRA 0.4-0.3 % SOLN SMARTSIG:1 Drop(s) In Eye(s) 6 Times Daily     VITAMIN D PO Take 1,000 Units by mouth daily.     No current facility-administered medications on file prior to visit.    BP 120/72   Pulse 60   Temp 97.6 F (36.4 C) (Temporal)   Ht 5\' 3"  (1.6 m)   Wt 133 lb (60.3 kg)   SpO2 96%   BMI 23.56 kg/m  Objective:   Physical Exam HENT:     Right Ear: Tympanic membrane and ear canal normal.     Left Ear: Tympanic membrane and ear canal normal.     Nose: Nose normal.  Eyes:     Conjunctiva/sclera: Conjunctivae normal.     Pupils: Pupils are equal, round, and reactive to light.  Neck:     Thyroid: No thyromegaly.  Cardiovascular:     Rate and Rhythm: Normal rate and regular rhythm.     Heart sounds: No murmur heard. Pulmonary:     Effort:  Pulmonary effort is normal.     Breath sounds: Normal breath sounds. No rales.  Abdominal:     General: Bowel sounds are normal.     Palpations: Abdomen is soft.     Tenderness: There is no abdominal tenderness.  Musculoskeletal:        General: Normal range of motion.     Cervical back: Neck supple.  Lymphadenopathy:     Cervical: No cervical adenopathy.  Skin:    General: Skin is warm and dry.     Findings: No rash.  Neurological:     Mental Status: She is alert and oriented to person, place, and time.     Cranial Nerves: No cranial nerve deficit.     Deep Tendon Reflexes: Reflexes are normal and symmetric.  Psychiatric:        Mood and Affect: Mood normal.           Assessment & Plan:  Essential hypertension Assessment & Plan: Controlled.  Continue amlodipine 5 mg daily.    SVT (supraventricular tachycardia) Assessment & Plan: Controlled.  Following with cardiology.  Continue metoprolol tartrate 12.5 mg BID.   Gastroesophageal reflux disease without esophagitis Assessment & Plan: Controlled.  Continue pantoprazole 40 mg daily.   DDD (degenerative disc disease), lumbar Assessment & Plan: Stable.  Continue cyclobenzaprine 5 mg as needed for which she uses sparingly.   Primary osteoarthritis of both hands Assessment & Plan: Following with rheumatology.  Office notes reviewed from August 2024.  Continue methotrexate 20 mg weekly, Remicade infusions every 6 weeks, folic acid daily as prescribed.   Primary osteoarthritis of both knees Assessment & Plan: Following with rheumatology.  Office notes reviewed from August 2024.  Continue methotrexate 20 mg weekly, Remicade infusions every 6 weeks, folic acid daily as prescribed.   Glaucoma, unspecified glaucoma type, unspecified laterality Assessment & Plan: Stable.  Continue regular visits with [facility].   Pure hypercholesterolemia Assessment & Plan: Repeat lipid panel pending. Continue  pravastatin 40 mg daily.  Orders: -     Lipid panel  Persistent cough for 3 weeks or longer Assessment & Plan: Significant improvement with pantoprazole 40 mg daily. Continue pantoprazole 40 mg daily.   Prediabetes Assessment & Plan: Repeat A1c pending.  Orders: -     Hemoglobin A1c  Tachycardia Assessment & Plan: Controlled.  Continue metoprolol tartrate 12.5 mg twice daily.   Sarcoidosis Assessment & Plan: Controlled.  No longer following with pulmonology due to her stability. Continue to monitor.  She declines Prevnar 20 vaccine today.         Doreene Nest, NP

## 2023-06-02 NOTE — Patient Instructions (Signed)
Stop by the lab prior to leaving today. I will notify you of your results once received.   It was a pleasure to see you today!  

## 2023-06-02 NOTE — Assessment & Plan Note (Signed)
Significant improvement with pantoprazole 40 mg daily. Continue pantoprazole 40 mg daily.

## 2023-06-02 NOTE — Assessment & Plan Note (Signed)
Controlled. ? ?Continue metoprolol tartrate 12.5 mg twice daily.  ?

## 2023-06-02 NOTE — Assessment & Plan Note (Signed)
Repeat lipid panel pending. Continue pravastatin 40 mg daily. 

## 2023-06-02 NOTE — Assessment & Plan Note (Signed)
Controlled.  Continue pantoprazole 40 mg daily. 

## 2023-06-13 ENCOUNTER — Other Ambulatory Visit: Payer: Self-pay | Admitting: Primary Care

## 2023-06-13 DIAGNOSIS — E785 Hyperlipidemia, unspecified: Secondary | ICD-10-CM

## 2023-06-14 ENCOUNTER — Other Ambulatory Visit: Payer: Self-pay | Admitting: Pharmacist

## 2023-06-14 ENCOUNTER — Ambulatory Visit (HOSPITAL_COMMUNITY)
Admission: RE | Admit: 2023-06-14 | Discharge: 2023-06-14 | Disposition: A | Discharge status: Home / Self Care | Payer: Medicare Other | Source: Ambulatory Visit | Attending: Rheumatology | Admitting: Rheumatology

## 2023-06-14 DIAGNOSIS — Z111 Encounter for screening for respiratory tuberculosis: Secondary | ICD-10-CM | POA: Diagnosis not present

## 2023-06-14 DIAGNOSIS — Z79899 Other long term (current) drug therapy: Secondary | ICD-10-CM | POA: Insufficient documentation

## 2023-06-14 DIAGNOSIS — D869 Sarcoidosis, unspecified: Secondary | ICD-10-CM | POA: Diagnosis not present

## 2023-06-14 DIAGNOSIS — H209 Unspecified iridocyclitis: Secondary | ICD-10-CM

## 2023-06-14 LAB — CBC WITH DIFFERENTIAL/PLATELET
Abs Immature Granulocytes: 0.01 10*3/uL (ref 0.00–0.07)
Basophils Absolute: 0 10*3/uL (ref 0.0–0.1)
Basophils Relative: 1 %
Eosinophils Absolute: 0.1 10*3/uL (ref 0.0–0.5)
Eosinophils Relative: 1 %
HCT: 41.3 % (ref 36.0–46.0)
Hemoglobin: 13.8 g/dL (ref 12.0–15.0)
Immature Granulocytes: 0 %
Lymphocytes Relative: 52 %
Lymphs Abs: 4.2 10*3/uL — ABNORMAL HIGH (ref 0.7–4.0)
MCH: 30.1 pg (ref 26.0–34.0)
MCHC: 33.4 g/dL (ref 30.0–36.0)
MCV: 90.2 fL (ref 80.0–100.0)
Monocytes Absolute: 0.5 10*3/uL (ref 0.1–1.0)
Monocytes Relative: 6 %
Neutro Abs: 3.1 10*3/uL (ref 1.7–7.7)
Neutrophils Relative %: 40 %
Platelets: 287 10*3/uL (ref 150–400)
RBC: 4.58 MIL/uL (ref 3.87–5.11)
RDW: 13.2 % (ref 11.5–15.5)
WBC: 7.9 10*3/uL (ref 4.0–10.5)
nRBC: 0 % (ref 0.0–0.2)

## 2023-06-14 LAB — COMPREHENSIVE METABOLIC PANEL
ALT: 14 U/L (ref 0–44)
AST: 18 U/L (ref 15–41)
Albumin: 3.6 g/dL (ref 3.5–5.0)
Alkaline Phosphatase: 52 U/L (ref 38–126)
Anion gap: 6 (ref 5–15)
BUN: 10 mg/dL (ref 8–23)
CO2: 24 mmol/L (ref 22–32)
Calcium: 8.7 mg/dL — ABNORMAL LOW (ref 8.9–10.3)
Chloride: 110 mmol/L (ref 98–111)
Creatinine, Ser: 0.87 mg/dL (ref 0.44–1.00)
GFR, Estimated: >60 mL/min (ref >60)
Glucose, Bld: 109 mg/dL — ABNORMAL HIGH (ref 70–99)
Potassium: 4.1 mmol/L (ref 3.5–5.1)
Sodium: 140 mmol/L (ref 135–145)
Total Bilirubin: 0.6 mg/dL (ref 0.3–1.2)
Total Protein: 6.1 g/dL — ABNORMAL LOW (ref 6.5–8.1)

## 2023-06-14 MED ORDER — DIPHENHYDRAMINE HCL 25 MG PO CAPS: 25.0000 mg | ORAL_CAPSULE | ORAL | Status: DC

## 2023-06-14 MED ORDER — SODIUM CHLORIDE 0.9 % IV SOLN
3.0000 mg/kg | INTRAVENOUS | Status: DC
  Administered 2023-06-14: 200 mg via INTRAVENOUS
  Filled 2023-06-14: qty 20

## 2023-06-14 MED ORDER — ACETAMINOPHEN 325 MG PO TABS: 650.0000 mg | ORAL_TABLET | ORAL | Status: DC

## 2023-06-14 NOTE — Progress Notes (Signed)
Message sent to [facility] for new orders.

## 2023-06-14 NOTE — Progress Notes (Signed)
Next infusion scheduled for Remicade on 07/26/2023 and due for updated orders. Diagnosis: uveitis, sarcoidosis  Dose: 3mg /kg every 6 weeks  Last Clinic Visit: 05/25/23 Next Clinic Visit: 5 months  Last infusion: 06/14/23  Labs: CBC and CMP on 06/14/23 TB Gold: negative on 06/14/23  Orders placed for Remicade x 2 doses along with premedication of acetaminophen and diphenhydramine to be administered 30 minutes before medication infusion.  Standing CBC with diff/platelet and CMP with GFR orders placed to be drawn every 12 weeks (with every other infusion).  Next TB gold due 06/13/24  Chesley Mires, PharmD, MPH, BCPS, CPP Clinical Pharmacist (Rheumatology and Pulmonology)

## 2023-06-15 NOTE — Progress Notes (Signed)
Calcium is low.  Patient should take calcium total 1200 mg daily between dietary and supplement.  CBC is stable.

## 2023-06-16 LAB — QUANTIFERON-TB GOLD PLUS (RQFGPL)
QuantiFERON Mitogen Value: >10 [IU]/mL
QuantiFERON Nil Value: 0.05 [IU]/mL
QuantiFERON TB1 Ag Value: 0.06 [IU]/mL
QuantiFERON TB2 Ag Value: 0.06 [IU]/mL

## 2023-06-16 LAB — QUANTIFERON-TB GOLD PLUS: QuantiFERON-TB Gold Plus: NEGATIVE

## 2023-06-16 NOTE — Progress Notes (Signed)
TB gold negative

## 2023-07-12 ENCOUNTER — Other Ambulatory Visit (HOSPITAL_BASED_OUTPATIENT_CLINIC_OR_DEPARTMENT_OTHER): Payer: Self-pay

## 2023-07-26 ENCOUNTER — Ambulatory Visit (HOSPITAL_COMMUNITY)
Admission: RE | Admit: 2023-07-26 | Discharge: 2023-07-26 | Disposition: A | Discharge status: Home / Self Care | Payer: Medicare Other | Source: Ambulatory Visit | Attending: Rheumatology | Admitting: Rheumatology

## 2023-07-26 DIAGNOSIS — Z79899 Other long term (current) drug therapy: Secondary | ICD-10-CM | POA: Diagnosis not present

## 2023-07-26 DIAGNOSIS — H209 Unspecified iridocyclitis: Secondary | ICD-10-CM | POA: Diagnosis not present

## 2023-07-26 DIAGNOSIS — D869 Sarcoidosis, unspecified: Secondary | ICD-10-CM | POA: Diagnosis not present

## 2023-07-26 DIAGNOSIS — M5412 Radiculopathy, cervical region: Secondary | ICD-10-CM | POA: Diagnosis not present

## 2023-07-26 LAB — CBC WITH DIFFERENTIAL/PLATELET
Abs Immature Granulocytes: 0.02 10*3/uL (ref 0.00–0.07)
Basophils Absolute: 0 10*3/uL (ref 0.0–0.1)
Basophils Relative: 0 %
Eosinophils Absolute: 0.1 10*3/uL (ref 0.0–0.5)
Eosinophils Relative: 1 %
HCT: 41.1 % (ref 36.0–46.0)
Hemoglobin: 14 g/dL (ref 12.0–15.0)
Immature Granulocytes: 0 %
Lymphocytes Relative: 48 %
Lymphs Abs: 3.4 10*3/uL (ref 0.7–4.0)
MCH: 31.7 pg (ref 26.0–34.0)
MCHC: 34.1 g/dL (ref 30.0–36.0)
MCV: 93 fL (ref 80.0–100.0)
Monocytes Absolute: 0.5 10*3/uL (ref 0.1–1.0)
Monocytes Relative: 7 %
Neutro Abs: 3.2 10*3/uL (ref 1.7–7.7)
Neutrophils Relative %: 44 %
Platelets: 281 10*3/uL (ref 150–400)
RBC: 4.42 MIL/uL (ref 3.87–5.11)
RDW: 13.2 % (ref 11.5–15.5)
WBC: 7.3 10*3/uL (ref 4.0–10.5)
nRBC: 0 % (ref 0.0–0.2)

## 2023-07-26 LAB — COMPREHENSIVE METABOLIC PANEL
ALT: 16 U/L (ref 0–44)
AST: 20 U/L (ref 15–41)
Albumin: 4.1 g/dL (ref 3.5–5.0)
Alkaline Phosphatase: 60 U/L (ref 38–126)
Anion gap: 8 (ref 5–15)
BUN: 13 mg/dL (ref 8–23)
CO2: 25 mmol/L (ref 22–32)
Calcium: 8.8 mg/dL — ABNORMAL LOW (ref 8.9–10.3)
Chloride: 107 mmol/L (ref 98–111)
Creatinine, Ser: 1.02 mg/dL — ABNORMAL HIGH (ref 0.44–1.00)
GFR, Estimated: 59 mL/min — ABNORMAL LOW (ref >60)
Glucose, Bld: 149 mg/dL — ABNORMAL HIGH (ref 70–99)
Potassium: 3.8 mmol/L (ref 3.5–5.1)
Sodium: 140 mmol/L (ref 135–145)
Total Bilirubin: 1 mg/dL (ref 0.3–1.2)
Total Protein: 6.9 g/dL (ref 6.5–8.1)

## 2023-07-26 MED ORDER — ACETAMINOPHEN 325 MG PO TABS: 650.0000 mg | ORAL_TABLET | ORAL | Status: DC

## 2023-07-26 MED ORDER — SODIUM CHLORIDE 0.9 % IV SOLN
3.0000 mg/kg | INTRAVENOUS | Status: DC
  Administered 2023-07-26: 200 mg via INTRAVENOUS
  Filled 2023-07-26: qty 20

## 2023-07-26 MED ORDER — DIPHENHYDRAMINE HCL 25 MG PO CAPS: 25.0000 mg | ORAL_CAPSULE | ORAL | Status: DC

## 2023-07-27 ENCOUNTER — Other Ambulatory Visit: Payer: Self-pay | Admitting: Neurosurgery

## 2023-07-27 DIAGNOSIS — M5412 Radiculopathy, cervical region: Secondary | ICD-10-CM

## 2023-07-27 NOTE — Progress Notes (Signed)
CBC is normal.  Glucose is elevated at 149.  Creatinine is mildly elevated.  Patient is clinically doing well at the last visit.  Please advise patient to reduce methotrexate from 8 tablets p.o. weekly to 6 tablets p.o. weekly.  Calcium is low.  Patient should take calcium supplement.  Patient should avoid all NSAIDs and increase water intake.

## 2023-07-28 ENCOUNTER — Other Ambulatory Visit: Payer: Self-pay | Admitting: *Deleted

## 2023-07-28 MED ORDER — METHOTREXATE SODIUM 2.5 MG PO TABS: 15.0000 mg | ORAL_TABLET | ORAL | Status: DC

## 2023-07-28 NOTE — Telephone Encounter (Signed)
-----   Message from [facility] sent at 07/27/2023  8:32 AM EDT ----- CBC is normal.  Glucose is elevated at 149.  Creatinine is mildly elevated.  Patient is clinically doing well at the last visit.  Please advise patient to reduce methotrexate from 8 tablets p.o. weekly to 6 tablets p.o. weekly.  Calcium is low.  Pati ent should take calcium supplement.  Patient should avoid all NSAIDs and increase water intake.

## 2023-08-15 ENCOUNTER — Ambulatory Visit
Admission: RE | Admit: 2023-08-15 | Discharge: 2023-08-15 | Disposition: A | Discharge status: Home / Self Care | Payer: Medicare Other | Source: Ambulatory Visit | Attending: Neurosurgery | Admitting: Neurosurgery

## 2023-08-15 DIAGNOSIS — M542 Cervicalgia: Secondary | ICD-10-CM | POA: Diagnosis not present

## 2023-08-15 DIAGNOSIS — M25512 Pain in left shoulder: Secondary | ICD-10-CM | POA: Diagnosis not present

## 2023-08-15 DIAGNOSIS — M25511 Pain in right shoulder: Secondary | ICD-10-CM | POA: Diagnosis not present

## 2023-08-15 DIAGNOSIS — M5412 Radiculopathy, cervical region: Secondary | ICD-10-CM

## 2023-08-24 DIAGNOSIS — M5412 Radiculopathy, cervical region: Secondary | ICD-10-CM | POA: Diagnosis not present

## 2023-08-24 DIAGNOSIS — G5601 Carpal tunnel syndrome, right upper limb: Secondary | ICD-10-CM | POA: Diagnosis not present

## 2023-08-24 DIAGNOSIS — G5602 Carpal tunnel syndrome, left upper limb: Secondary | ICD-10-CM | POA: Diagnosis not present

## 2023-08-25 DIAGNOSIS — M5412 Radiculopathy, cervical region: Secondary | ICD-10-CM | POA: Diagnosis not present

## 2023-08-26 ENCOUNTER — Other Ambulatory Visit: Payer: Self-pay | Admitting: Physician Assistant

## 2023-08-26 NOTE — Telephone Encounter (Signed)
Last Fill: 05/19/2023  Labs: 07/26/2023 CBC is normal.  Glucose is elevated at 149.  Creatinine is mildly elevated  Calcium is low.   Next Visit: 10/26/2022  Last Visit: 05/25/2023  DX: Uveitis   Current Dose per lab note 07/26/2023: reduce methotrexate from 8 tablets p.o. weekly to 6 tablets p.o. weekly.   Okay to refill Methotrexate?

## 2023-09-01 ENCOUNTER — Telehealth: Payer: Self-pay | Admitting: Primary Care

## 2023-09-01 DIAGNOSIS — K219 Gastro-esophageal reflux disease without esophagitis: Secondary | ICD-10-CM

## 2023-09-01 MED ORDER — PANTOPRAZOLE SODIUM 40 MG PO TBEC: DELAYED_RELEASE_TABLET | ORAL | 2 refills | Status: AC

## 2023-09-01 NOTE — Telephone Encounter (Signed)
Prescription Request  09/01/2023  LOV: 06/02/2023  What is the name of the medication or equipment? pantoprazole (PROTONIX) 40 MG tablet   Have you contacted your pharmacy to request a refill? Yes   Which pharmacy would you like this sent to?  [id number] - [place], [place] - [address] AT [facility] [address] [place] [address] Phone: [phone] Fax: [phone]    Patient notified that their request is being sent to the clinical staff for review and that they should receive a response within 2 business days.   Please advise at Mobile [phone] (mobile)  Pt states she's been requesting a refill for meds for weeks now. Pt states she's out of meds & needs rx sent in

## 2023-09-01 NOTE — Telephone Encounter (Signed)
Refills sent to pharmacy. 

## 2023-09-03 DIAGNOSIS — M5412 Radiculopathy, cervical region: Secondary | ICD-10-CM | POA: Diagnosis not present

## 2023-09-06 ENCOUNTER — Other Ambulatory Visit: Payer: Self-pay | Admitting: Pharmacist

## 2023-09-06 ENCOUNTER — Ambulatory Visit (HOSPITAL_COMMUNITY)
Admission: RE | Admit: 2023-09-06 | Discharge: 2023-09-06 | Disposition: A | Discharge status: Home / Self Care | Payer: Medicare Other | Source: Ambulatory Visit | Attending: Rheumatology | Admitting: Rheumatology

## 2023-09-06 DIAGNOSIS — H209 Unspecified iridocyclitis: Secondary | ICD-10-CM | POA: Diagnosis not present

## 2023-09-06 DIAGNOSIS — Z79899 Other long term (current) drug therapy: Secondary | ICD-10-CM | POA: Insufficient documentation

## 2023-09-06 DIAGNOSIS — D869 Sarcoidosis, unspecified: Secondary | ICD-10-CM | POA: Diagnosis not present

## 2023-09-06 MED ORDER — ACETAMINOPHEN 325 MG PO TABS: 650.0000 mg | ORAL_TABLET | ORAL | Status: DC

## 2023-09-06 MED ORDER — DIPHENHYDRAMINE HCL 25 MG PO CAPS
25.0000 mg | ORAL_CAPSULE | ORAL | Status: DC
Start: 2023-09-06 — End: 2023-09-07

## 2023-09-06 MED ORDER — INFLIXIMAB 100 MG IV SOLR
3.0000 mg/kg | INTRAVENOUS | Status: DC
  Administered 2023-09-06: 200 mg via INTRAVENOUS
  Filled 2023-09-06: qty 20

## 2023-09-06 NOTE — Progress Notes (Signed)
Next infusion scheduled for Remicade (G9562) on 10/18/2023 and due for updated orders. Diagnosis: uveitis, sarcoidosis  Dose: 3mg /kg every 6 weeks  Last Clinic Visit: 05/25/2023 Next Clinic Visit: 10/27/2023  Last infusion: 09/06/2023  Labs: CBC and CMP on 07/26/2023 TB Gold: negative on 06/14/2023   Orders placed for Remicade ([id number]) x 2 doses along with premedication of acetaminophen and diphenhydramine to be administered 30 minutes before medication infusion.  Standing CBC with diff/platelet and CMP with GFR orders placed to be drawn every 3 months.  Next TB gold due 06/13/2024  Chesley Mires, PharmD, MPH, BCPS, CPP Clinical Pharmacist (Rheumatology and Pulmonology)

## 2023-09-07 DIAGNOSIS — Z1231 Encounter for screening mammogram for malignant neoplasm of breast: Secondary | ICD-10-CM | POA: Diagnosis not present

## 2023-09-07 DIAGNOSIS — G5603 Carpal tunnel syndrome, bilateral upper limbs: Secondary | ICD-10-CM | POA: Diagnosis not present

## 2023-09-07 LAB — HM MAMMOGRAPHY

## 2023-09-08 DIAGNOSIS — M5412 Radiculopathy, cervical region: Secondary | ICD-10-CM | POA: Diagnosis not present

## 2023-09-10 DIAGNOSIS — M25562 Pain in left knee: Secondary | ICD-10-CM | POA: Diagnosis not present

## 2023-09-10 DIAGNOSIS — D869 Sarcoidosis, unspecified: Secondary | ICD-10-CM | POA: Diagnosis not present

## 2023-09-10 DIAGNOSIS — M064 Inflammatory polyarthropathy: Secondary | ICD-10-CM | POA: Diagnosis not present

## 2023-09-14 DIAGNOSIS — M5412 Radiculopathy, cervical region: Secondary | ICD-10-CM | POA: Diagnosis not present

## 2023-09-20 ENCOUNTER — Ambulatory Visit
Admission: EM | Admit: 2023-09-20 | Discharge: 2023-09-20 | Disposition: A | Discharge status: Home / Self Care | Payer: Medicare Other | Attending: Emergency Medicine | Admitting: Emergency Medicine

## 2023-09-20 ENCOUNTER — Ambulatory Visit: Payer: Medicare Other

## 2023-09-20 ENCOUNTER — Telehealth: Payer: Self-pay | Admitting: Emergency Medicine

## 2023-09-20 DIAGNOSIS — R079 Chest pain, unspecified: Secondary | ICD-10-CM | POA: Diagnosis not present

## 2023-09-20 DIAGNOSIS — R0781 Pleurodynia: Secondary | ICD-10-CM | POA: Diagnosis not present

## 2023-09-20 MED ORDER — PREDNISONE 10 MG (21) PO TBPK: ORAL_TABLET | Freq: Every day | ORAL | 0 refills | Status: DC

## 2023-09-20 MED ORDER — BACLOFEN 10 MG PO TABS: 5.0000 mg | ORAL_TABLET | Freq: Two times a day (BID) | ORAL | 0 refills | Status: AC

## 2023-09-20 NOTE — Telephone Encounter (Signed)
Reported x-ray results to patient via telephone, 2 patient identifiers used, patient requesting a relaxant to help her rest at nighttime, baclofen prescribed

## 2023-09-20 NOTE — ED Triage Notes (Signed)
Pt reports sternum pain wrapping around lower ribs into back. Reports it as a fullness or pressure. Worse with walking. Took Tums with no relief. States the only time she has felt this way was with pleurisy and when she had lesions on her lung.

## 2023-09-20 NOTE — ED Provider Notes (Signed)
Renaldo Fiddler    CSN: [id number] Arrival date & time: 09/20/23  1325      History   Chief Complaint Chief Complaint  Patient presents with   Rib Injury    Rib pain, no injury     HPI Renee Jones is a 72 y.o. female.   Patient presents for evaluation of bilateral chest wall pain located underneath the bilateral ribs radiating into the back present for 1 day. Pain exacerbated by movement. Has had similar symptoms in the past and at that time she had lesions noted to the lungs and diagnosed with sarcoidosis, currently taking transfusions and methotrexate for treatment, followed by pulmonology.    Endorses increased physical activity while writing herself for the upcoming holidays but denies direct injury.  Has attempted use of heat and over-the-counter arthritis medicine which has been ineffective.  Denies chest pain or tightness, shortness of breath, wheezing or cough.  Past Medical History:  Diagnosis Date   Allergy    Asthma    Bronchial spasm 09/14/2016   DDD (degenerative disc disease), lumbar 09/14/2016   Detached retina 09/14/2016   Diastolic dysfunction 08/28/2015   Essential hypertension 08/28/2015   GERD (gastroesophageal reflux disease)    Glaucoma 09/14/2016   Neuromuscular disorder (HCC)    Osteoarthritis of both hands 09/14/2016   Osteoarthritis of both knees 09/14/2016   Pulmonary nodules 09/30/2012   Pure hypercholesterolemia 02/20/2021   Sarcoidosis    with +lymph node biopsy   SVT (supraventricular tachycardia) (HCC) 08/28/2015   Tachycardia    Uveitis     Patient Active Problem List   Diagnosis Date Noted   Persistent cough for 3 weeks or longer 03/09/2023   Cervical spondylosis 11/12/2021   Hyperlipidemia 02/20/2021   Prediabetes 12/21/2018   High risk medications (not anticoagulants) long-term use 01/13/2017   GERD (gastroesophageal reflux disease) 09/14/2016   Glaucoma 09/14/2016   Osteoarthritis of both hands 09/14/2016    Osteoarthritis of both knees 09/14/2016   DDD (degenerative disc disease), lumbar 09/14/2016   Uveitis 06/22/2016   SVT (supraventricular tachycardia) (HCC) 08/28/2015   Essential hypertension 08/28/2015   Diastolic dysfunction 08/28/2015   Sarcoidosis 09/30/2012   Tachycardia 09/30/2012   Sarcoid arthropathy 09/30/2012    Past Surgical History:  Procedure Laterality Date   CATARACT EXTRACTION, BILATERAL  2018   CYSTECTOMY     EYE SURGERY Bilateral 2018   cataract removal    LUMBAR SPINE SURGERY  2009   ROTATOR CUFF REPAIR Right 2011   ROTATOR CUFF REPAIR Left 06/2018   TONSILLECTOMY AND ADENOIDECTOMY  1962   TOTAL ABDOMINAL HYSTERECTOMY  1984    OB History   No obstetric history on file.      Home Medications    Prior to Admission medications   Medication Sig Start Date End Date Taking? Authorizing Provider  albuterol (VENTOLIN HFA) 108 (90 Base) MCG/ACT inhaler Inhale 1-2 puffs into the lungs every 6 (six) hours as needed (cough). 01/11/23  Yes Doreene Nest, NP  amLODipine (NORVASC) 5 MG tablet Take 1 tablet (5 mg total) by mouth daily. for blood pressure. 07/29/22  Yes Doreene Nest, NP  benzonatate (TESSALON PERLES) 100 MG capsule 1-2 capsules up to twice daily as needed for cough. 01/14/23  Yes Kriste Basque R, DO  fluticasone (FLONASE) 50 MCG/ACT nasal spray Place 2 sprays into both nostrils daily as needed for allergies or rhinitis.   Yes [provider]  folic acid (FOLVITE) 1 MG tablet TAKE 2 TABLETS(2  MG) BY MOUTH DAILY 04/13/23  Yes Gearldine Bienenstock, PA-C  InFLIXimab (REMICADE IV) Inject into the vein every 6 (six) weeks.   Yes [provider]  methotrexate (RHEUMATREX) 2.5 MG tablet Take 6 tablets (15 mg total) by mouth once a week. 08/26/23  Yes Gearldine Bienenstock, PA-C  metoprolol tartrate (LOPRESSOR) 50 MG tablet Take 1.5 tablets (75 mg total) by mouth 2 (two) times daily. 03/01/23  Yes Chilton Si, MD  ondansetron (ZOFRAN) 4 MG tablet  Take 1 tablet (4 mg total) by mouth every 6 (six) hours as needed. 05/25/23  Yes Gearldine Bienenstock, PA-C  pantoprazole (PROTONIX) 40 MG tablet TAKE 1 TABLET BY MOUTH ONCE DAILY for heartburn 09/01/23  Yes Doreene Nest, NP  pravastatin (PRAVACHOL) 40 MG tablet TAKE 1 TABLET(40 MG) BY MOUTH DAILY FOR CHOLESTEROL 06/13/23  Yes Doreene Nest, NP  predniSONE (STERAPRED UNI-PAK 21 TAB) 10 MG (21) TBPK tablet Take by mouth daily. Take 6 tabs by mouth daily  for 1 days, then 5 tabs for 1 days, then 4 tabs for 1 days, then 3 tabs for 1 days, 2 tabs for 1 days, then 1 tab by mouth daily for 1 days 09/20/23  Yes Johntay Doolen R, NP  SYSTANE ULTRA 0.4-0.3 % SOLN SMARTSIG:1 Drop(s) In Eye(s) 6 Times Daily 07/15/20  Yes [provider]  VITAMIN D PO Take 1,000 Units by mouth daily.   Yes [provider]  cyclobenzaprine (FLEXERIL) 5 MG tablet Take 1 tablet (5 mg total) by mouth at bedtime as needed for muscle spasms. 05/22/22   Doreene Nest, NP    Family History Family History  Adopted: Yes  Problem Relation Age of Onset   Scleroderma Daughter     Social History Social History   Tobacco Use   Smoking status: Never    Passive exposure: Never   Smokeless tobacco: Never  Vaping Use   Vaping status: Never Used  Substance Use Topics   Alcohol use: No    Alcohol/week: 0.0 standard drinks of alcohol   Drug use: No     Allergies   Codeine, Amoxicillin-pot clavulanate, Erythromycin base, Other, Sulfa antibiotics, Tramadol, Tramadol hcl, and Erythromycin   Review of Systems Review of Systems   Physical Exam Triage Vital Signs ED Triage Vitals  Encounter Vitals Group     BP 09/20/23 1427 138/66     Systolic BP Percentile --      Diastolic BP Percentile --      Pulse Rate 09/20/23 1427 65     Resp 09/20/23 1427 16     Temp 09/20/23 1427 98.2 F (36.8 C)     Temp Source 09/20/23 1427 Oral     SpO2 09/20/23 1427 100 %     Weight --      Height --      Head  Circumference --      Peak Flow --      Pain Score 09/20/23 1439 8     Pain Loc --      Pain Education --      Exclude from Growth Chart --    No data found.  Updated Vital Signs BP 138/66 (BP Location: Left Arm)   Pulse 65   Temp 98.2 F (36.8 C) (Oral)   Resp 16   SpO2 100%   Visual Acuity Right Eye Distance:   Left Eye Distance:   Bilateral Distance:    Right Eye Near:   Left Eye Near:  Bilateral Near:     Physical Exam Constitutional:      Appearance: Normal appearance.  Eyes:     Extraocular Movements: Extraocular movements intact.  Cardiovascular:     Rate and Rhythm: Normal rate and regular rhythm.     Pulses: Normal pulses.     Heart sounds: Normal heart sounds.  Pulmonary:     Effort: Pulmonary effort is normal.     Breath sounds: Normal breath sounds.  Chest:     Comments: Point tenderness around the left anterior chest wall and left flank extending from ribs 4 through 6, no right-sided chest wall pain, chest wall is symmetrical without deformity Musculoskeletal:     Comments: Tenderness present to the bilateral thoracic region of the back without ecchymosis swelling or deformity, able to twist turn and bend  Neurological:     Mental Status: She is alert and oriented to person, place, and time. Mental status is at baseline.      UC Treatments / Results  Labs (all labs ordered are listed, but only abnormal results are displayed) Labs Reviewed - No data to display  EKG   Radiology No results found.  Procedures Procedures (including critical care time)  Medications Ordered in UC Medications - No data to display  Initial Impression / Assessment and Plan / UC Course  I have reviewed the triage vital signs and the nursing notes.  Pertinent labs & imaging results that were available during my care of the patient were reviewed by me and considered in my medical decision making (see chart for details).  Rib pain on right and left side  Vitals  are stable, patient denies respiratory symptoms, low suspicion for involvement however chest x-ray is pending, some of tenderness is reproducible on exam therefore most likely muscular, discussed this with patient, prescribed prednisone and recommended supportive care through [facility], heat massage stretching and activity as tolerated, if pain continues to persist past use of medicine she is to follow-up with her pulmonologist or primary doctor for reevaluation however for worsening or new symptoms she is to go to the nearest emergency department, verbalized understanding Final Clinical Impressions(s) / UC Diagnoses   Final diagnoses:  Rib pain on left side  Rib pain on right side     Discharge Instructions      Chest x-ray is pending, you will be notified of results via telephone  Your lungs are clear and your heart sounds normal, blood pressure and heart rate and oxygen level are all within normal range  Able to cause some of your discomfort by pressing on your body which is a reassuring comfort that this is most likely muscular pain as we are unable to press touch the heart or lungs  begin prednisone every morning with food as directed to reduce inflammation was Tylenol or any topical medicines in addition to this  May use heat or ice over the affected area in 10 to 15-minute intervals  May massage and stretch as tolerated  If your symptoms continue to persist but do not worsen please follow-up with your primary doctor or pulmonologist  If at any point your symptoms worsen or you begin to have new respiratory symptoms such as shortness of breath please go to the nearest emergency department for management   ED Prescriptions     Medication Sig Dispense Auth. Provider   predniSONE (STERAPRED UNI-PAK 21 TAB) 10 MG (21) TBPK tablet Take by mouth daily. Take 6 tabs by mouth daily  for 1 days,  then 5 tabs for 1 days, then 4 tabs for 1 days, then 3 tabs for 1 days, 2 tabs for 1 days, then 1  tab by mouth daily for 1 days 21 tablet Jamarii Banks, Elita Boone, NP      PDMP not reviewed this encounter.   Valinda Hoar, NP 09/20/23 [phone]

## 2023-09-20 NOTE — Discharge Instructions (Signed)
Chest x-ray is pending, you will be notified of results via telephone  Your lungs are clear and your heart sounds normal, blood pressure and heart rate and oxygen level are all within normal range  Able to cause some of your discomfort by pressing on your body which is a reassuring comfort that this is most likely muscular pain as we are unable to press touch the heart or lungs  begin prednisone every morning with food as directed to reduce inflammation was Tylenol or any topical medicines in addition to this  May use heat or ice over the affected area in 10 to 15-minute intervals  May massage and stretch as tolerated  If your symptoms continue to persist but do not worsen please follow-up with your primary doctor or pulmonologist  If at any point your symptoms worsen or you begin to have new respiratory symptoms such as shortness of breath please go to the nearest emergency department for management

## 2023-09-27 DIAGNOSIS — M5416 Radiculopathy, lumbar region: Secondary | ICD-10-CM | POA: Diagnosis not present

## 2023-09-28 DIAGNOSIS — G5601 Carpal tunnel syndrome, right upper limb: Secondary | ICD-10-CM | POA: Diagnosis not present

## 2023-09-28 DIAGNOSIS — G5602 Carpal tunnel syndrome, left upper limb: Secondary | ICD-10-CM | POA: Diagnosis not present

## 2023-09-28 DIAGNOSIS — M5412 Radiculopathy, cervical region: Secondary | ICD-10-CM | POA: Diagnosis not present

## 2023-09-29 DIAGNOSIS — M5412 Radiculopathy, cervical region: Secondary | ICD-10-CM | POA: Diagnosis not present

## 2023-10-01 DIAGNOSIS — K219 Gastro-esophageal reflux disease without esophagitis: Secondary | ICD-10-CM | POA: Diagnosis not present

## 2023-10-01 DIAGNOSIS — K5900 Constipation, unspecified: Secondary | ICD-10-CM | POA: Diagnosis not present

## 2023-10-15 ENCOUNTER — Telehealth: Payer: Self-pay | Admitting: Pharmacist

## 2023-10-15 NOTE — Telephone Encounter (Signed)
Patient has Medicare A/B and generic commercial supplement. ATC patient regarding insurance changes in 2025 (if any). Left VM   Medication: Remicade 2024 coverage: Medicare A/B and generic commercial supplement   Her supplemental plan follows Medicare guidelines. Pre-certification is not required for Part B drugs. Patient will be responsible for Part B deductible. Medicare will cover 80% of cost of infusion and the supplement will pick up the 20% for which the patient would be responsible.  First infusion in 2025 is on 11/29/2023   Chesley Mires, PharmD, MPH, BCPS, CPP Clinical Pharmacist (Rheumatology and Pulmonology)

## 2023-10-15 NOTE — Progress Notes (Signed)
 Office Visit Note  Patient: Renee Jones             Date of Birth: 1951/04/01           MRN: [id number]             PCP: Gretta Comer POUR, NP Referring: Gretta Comer POUR, NP Visit Date: 10/27/2023 Occupation: @GUAROCC @  Subjective:  Medication monitoring   History of Present Illness: Renee Jones is a 72 y.o. female with history of uveitis, sarcoidosis, and osteoarthritis.  Patient remains on  Remicade  IV infusion 3 mg/kg every 6 weeks, methotrexate  2.5 mg 6 tablets by mouth once weekly every 7 days, and folic acid  1 mg 2 tablets daily.  She is tolerating nation therapy without any side effects.  She reduce the dose of methotrexate  from 8 tablets to 6 tablets about 3 months ago.  She has been tolerating the reduced dose of methotrexate  better and has not noticed any new or worsening symptoms.  She denies any signs or symptoms of uveitis flare.  She uses Systane eyedrops for dry eyes but has not had any eye pain, eye redness, or sensitivity to light.  Patient denies any new or worsening pulmonary symptoms.  She had an updated chest x-ray on 09/20/2023. Patient continues to experience intermittent aching and joint stiffness with weather changes.  She has had some increased discomfort in the left knee and would like to reapply for viscosupplementation in spring 2025.  She denies any joint swelling at this time.  Patient reports that she has been under the care of Dr. Gillie and was recently diagnosed with carpal tunnel syndrome affecting both hands, right greater than left. She denies any recent or recurrent infections. She denies any new medical conditions.  She has been making dietary changes to try to improve her blood glucose level.  She has been primarily eating vegetables and obtaining protein in her diet.  Activities of Daily Living:  Patient reports morning stiffness for 3-5 minutes.   Patient Reports nocturnal pain.  Difficulty dressing/grooming:  Denies Difficulty climbing stairs: Denies Difficulty getting out of chair: Denies Difficulty using hands for taps, buttons, cutlery, and/or writing: Denies  Review of Systems  Constitutional:  Negative for fatigue.  HENT:  Negative for mouth sores and mouth dryness.   Eyes:  Positive for dryness.  Respiratory:  Negative for shortness of breath.   Cardiovascular:  Positive for palpitations. Negative for chest pain.  Gastrointestinal:  Negative for blood in stool, constipation and diarrhea.  Endocrine: Positive for increased urination.  Genitourinary:  Negative for involuntary urination.  Musculoskeletal:  Positive for joint pain, joint pain, myalgias, morning stiffness and myalgias. Negative for gait problem, joint swelling, muscle weakness and muscle tenderness.  Skin:  Negative for color change, rash, hair loss and sensitivity to sunlight.  Allergic/Immunologic: Negative for susceptible to infections.  Neurological:  Negative for dizziness and headaches.  Hematological:  Negative for swollen glands.  Psychiatric/Behavioral:  Positive for sleep disturbance. Negative for depressed mood. The patient is nervous/anxious.     PMFS History:  Patient Active Problem List   Diagnosis Date Noted   Persistent cough for 3 weeks or longer 03/09/2023   Cervical spondylosis 11/12/2021   Hyperlipidemia 02/20/2021   Prediabetes 12/21/2018   High risk medications (not anticoagulants) long-term use 01/13/2017   GERD (gastroesophageal reflux disease) 09/14/2016   Glaucoma 09/14/2016   Osteoarthritis of both hands 09/14/2016   Osteoarthritis of both knees 09/14/2016   DDD (degenerative disc disease),  lumbar 09/14/2016   Uveitis 06/22/2016   SVT (supraventricular tachycardia) (HCC) 08/28/2015   Essential hypertension 08/28/2015   Diastolic dysfunction 08/28/2015   Sarcoidosis 09/30/2012   Tachycardia 09/30/2012   Sarcoid arthropathy 09/30/2012    Past Medical History:  Diagnosis Date   Allergy     Asthma    Bronchial spasm 09/14/2016   DDD (degenerative disc disease), lumbar 09/14/2016   Detached retina 09/14/2016   Diastolic dysfunction 08/28/2015   Essential hypertension 08/28/2015   GERD (gastroesophageal reflux disease)    Glaucoma 09/14/2016   Neuromuscular disorder (HCC)    Osteoarthritis of both hands 09/14/2016   Osteoarthritis of both knees 09/14/2016   Pulmonary nodules 09/30/2012   Pure hypercholesterolemia 02/20/2021   Sarcoidosis    with +lymph node biopsy   SVT (supraventricular tachycardia) (HCC) 08/28/2015   Tachycardia    Uveitis     Family History  Adopted: Yes  Problem Relation Age of Onset   Scleroderma Daughter    Past Surgical History:  Procedure Laterality Date   CATARACT EXTRACTION, BILATERAL  2018   CYSTECTOMY     EYE SURGERY Bilateral 2018   cataract removal    LUMBAR SPINE SURGERY  2009   ROTATOR CUFF REPAIR Right 2011   ROTATOR CUFF REPAIR Left 06/2018   TONSILLECTOMY AND ADENOIDECTOMY  1962   TOTAL ABDOMINAL HYSTERECTOMY  1984   Social History   Social History Narrative   Widower.   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   Moved from [place] .   Immunization History  Administered Date(s) Administered   DTaP 07/26/2010   Influenza Split 07/19/2014   Influenza,inj,Quad PF,6+ Mos 07/19/2016, 12/21/2018   Influenza-Unspecified 07/09/2019   Pneumococcal Conjugate-13 01/21/2015   Pneumococcal Polysaccharide-23 09/19/2012, 09/21/2012   Pneumococcal-Unspecified 07/26/2012, 01/18/2015   Td 03/31/2010   Tdap 05/07/2010   Varicella 09/19/2012   Zoster, Live 07/26/2012, 09/21/2012     Objective: Vital Signs: BP 113/67 (BP Location: Left Arm, Patient Position: Sitting, Cuff Size: Normal)   Pulse 61   Resp 12   Ht 5' 3 (1.6 m)   Wt 130 lb (59 kg)   BMI 23.03 kg/m    Physical Exam Vitals and nursing note reviewed.  Constitutional:      Appearance: She is well-developed.  HENT:     Head: Normocephalic and atraumatic.   Eyes:     Conjunctiva/sclera: Conjunctivae normal.  Cardiovascular:     Rate and Rhythm: Normal rate and regular rhythm.     Heart sounds: Normal heart sounds.  Pulmonary:     Effort: Pulmonary effort is normal.     Breath sounds: Normal breath sounds.  Abdominal:     General: Bowel sounds are normal.     Palpations: Abdomen is soft.  Musculoskeletal:     Cervical back: Normal range of motion.  Lymphadenopathy:     Cervical: No cervical adenopathy.  Skin:    General: Skin is warm and dry.     Capillary Refill: Capillary refill takes less than 2 seconds.  Neurological:     Mental Status: She is alert and oriented to person, place, and time.  Psychiatric:        Behavior: Behavior normal.      Musculoskeletal Exam: C-spine, thoracic spine, and lumbar spine good ROM.  Shoulder joints, elbow joints, wrist joints, Mcps, PIPs, and DIPs have good ROM with no synovitis.  Complete fist formation bilaterally.  Hip joints have good ROM with no groin pain.  Knee joints have good ROM with  no warmth or effusion.  Ankle joints have good ROM with no tenderness or swelling.     CDAI Exam: CDAI Score: -- Patient Global: --; Provider Global: -- Swollen: --; Tender: -- Joint Exam 10/27/2023   No joint exam has been documented for this visit   There is currently no information documented on the homunculus. Go to the Rheumatology activity and complete the homunculus joint exam.  Investigation: No additional findings.  Imaging: No results found.   Recent Labs: Lab Results  Component Value Date   WBC 8.6 10/18/2023   HGB 13.0 10/18/2023   PLT 312 10/18/2023   NA 138 10/18/2023   K 3.4 (L) 10/18/2023   CL 110 10/18/2023   CO2 23 10/18/2023   GLUCOSE 176 (H) 10/18/2023   BUN 13 10/18/2023   CREATININE 0.82 10/18/2023   BILITOT 0.7 10/18/2023   ALKPHOS 60 10/18/2023   AST 19 10/18/2023   ALT 16 10/18/2023   PROT 5.8 (L) 10/18/2023   ALBUMIN 3.5 10/18/2023   CALCIUM  8.7 (L)  10/18/2023   GFRAA 79 05/20/2020   QFTBGOLD Negative 06/16/2017   QFTBGOLDPLUS Negative 06/14/2023    Speciality Comments: REMICADE  3 mg/kg x 6 weeks-ACY 06/17/20   Procedures:  No procedures performed Allergies: Codeine, Amoxicillin-pot clavulanate, Erythromycin base, Other, Sulfa antibiotics, Tramadol, Tramadol hcl, and Erythromycin       Assessment / Plan:     Visit Diagnoses: Uveitis - [facility]: She has not had any signs or symptoms of uveitis flare.  She has been using Systane eyedrops for symptoms of dry eyes.  She has been following up at [facility] eye care on a yearly basis.  Patient remains on IV Remicade  infusions 3 mg/kg every 6 weeks and methotrexate  6 tablets by mouth once weekly.  She has reduced the dose of methotrexate  from 8 tablets to 6 tablets weekly about 3 months ago and has not noticed any new or worsening symptoms.  No medication changes will be made at this time.  She was advised to notify us  if she starts to have recurrent flares of uveitis.  Sarcoidosis: She has not been exhibiting any signs or symptoms of active disease.  No new or worsening pulmonary symptoms.  No signs of inflammatory arthritis.  No recent uveitis flares.  CXR no cardiopulmonary process noted on 09/20/23.  Patient remains on IV Remicade  infusions 3 mg/kg every 6 weeks and methotrexate  6 tablets by mouth once weekly.  The dose of methotrexate  was reduced from 8 tablets to 6 tablets weekly due to elevated creatinine in October 2024. Creatinine and GFR have improved and she has been tolerating the reduced dose of methotrexate  better.   No medication changes will be made at this time.  She was advised to notify us  if she develops any signs or symptoms of a flare.  High risk medications (not anticoagulants) long-term use - Remicade  IV infusion 3 mg/kg every 6 weeks, methotrexate  2.5 mg 6 tablets by mouth once weekly every 7 days, and folic acid  1 mg 2 tablets daily. The dose of methotrexate  was reduced  from 8 tablets to 6 tablets weekly after lab work on 07/26/2023 at which time her creatinine was 1.02 and GFR was 59.  Creatinine improved to 0.82 and GFR was greater than 60 on 10/18/2023. CBC and CMP updated on 10/18/23.  She will continue to have updated lab work with infusions.   She has been making dietary changes to reduce her blood glucose level. TB gold negative on 06/14/23.  No recent or recurrent infections. Discussed the importance of holding methotrexate  and postponing remicade  infusions if she develops signs or symptoms of an infection and to resume once the infection has completely cleared.    Pulmonary nodules: Chest x-ray updated on 09/20/2023: Normal pulmonary vasculature.  No evidence of effusion, infiltrate, or pneumothorax.  No acute bony abnormality.  S/P left rotator cuff repair: Not currently symptomatic.   History of repair of right rotator cuff: Not currently symptomatic.   Primary osteoarthritis of both hands: No tenderness or synovitis noted.   Right hand paresthesia: Patient has been under the care of Dr. Gillie and has been diagnosed with carpal tunnel syndrome bilaterally, right more severe than left.    Primary osteoarthritis of both knees: Patient underwent viscosupplementation for both knees in June/July 2023.  She had repeat viscosupplementation performed for the left knee in April/May 2024.  She has had a recurrence of discomfort involving the left knee and plans to reapply for viscosupplementation in the spring 2025.  On examination today she has good range of motion of both knee joints with no warmth or effusion.  This patient is diagnosed with osteoarthritis of the knee(s).    Radiographs show evidence of joint space narrowing, osteophytes, subchondral sclerosis and/or subchondral cysts.  This patient has knee pain which interferes with functional and activities of daily living.    This patient has experienced inadequate response, adverse effects and/or  intolerance with conservative treatments such as acetaminophen , NSAIDS, topical creams, physical therapy or regular exercise, knee bracing and/or weight loss.   This patient has experienced inadequate response or has a contraindication to intra articular steroid injections for at least 3 months.   This patient is not scheduled to have a total knee replacement within 6 months of starting treatment with viscosupplementation.  Degeneration of intervertebral disc of lumbar region without discogenic back pain or lower extremity pain: Not currently symptomatic.  No symptoms of radiculopathy.   Other medical conditions are listed as follows:  SVT (supraventricular tachycardia) (HCC)  Essential hypertension: Blood pressure is 113/67 today in the office.  Gastroesophageal reflux disease without esophagitis  Orders: No orders of the defined types were placed in this encounter.  No orders of the defined types were placed in this encounter.    Follow-Up Instructions: Return in about 5 months (around 03/26/2024) for Uveitis, Sarcoidosis.   Waddell CHRISTELLA Craze, PA-C  Note - This record has been created using Dragon software.  Chart creation errors have been sought, but may not always  have been located. Such creation errors do not reflect on  the standard of medical care.

## 2023-10-18 ENCOUNTER — Ambulatory Visit (HOSPITAL_COMMUNITY)
Admission: RE | Admit: 2023-10-18 | Discharge: 2023-10-18 | Disposition: A | Discharge status: Home / Self Care | Payer: Medicare Other | Source: Ambulatory Visit | Attending: Rheumatology | Admitting: Rheumatology

## 2023-10-18 DIAGNOSIS — H209 Unspecified iridocyclitis: Secondary | ICD-10-CM | POA: Diagnosis not present

## 2023-10-18 DIAGNOSIS — D869 Sarcoidosis, unspecified: Secondary | ICD-10-CM | POA: Diagnosis not present

## 2023-10-18 DIAGNOSIS — Z79899 Other long term (current) drug therapy: Secondary | ICD-10-CM | POA: Insufficient documentation

## 2023-10-18 LAB — COMPREHENSIVE METABOLIC PANEL
ALT: 16 U/L (ref 0–44)
AST: 19 U/L (ref 15–41)
Albumin: 3.5 g/dL (ref 3.5–5.0)
Alkaline Phosphatase: 60 U/L (ref 38–126)
Anion gap: 5 (ref 5–15)
BUN: 13 mg/dL (ref 8–23)
CO2: 23 mmol/L (ref 22–32)
Calcium: 8.7 mg/dL — ABNORMAL LOW (ref 8.9–10.3)
Chloride: 110 mmol/L (ref 98–111)
Creatinine, Ser: 0.82 mg/dL (ref 0.44–1.00)
GFR, Estimated: >60 mL/min (ref >60)
Glucose, Bld: 176 mg/dL — ABNORMAL HIGH (ref 70–99)
Potassium: 3.4 mmol/L — ABNORMAL LOW (ref 3.5–5.1)
Sodium: 138 mmol/L (ref 135–145)
Total Bilirubin: 0.7 mg/dL (ref 0.0–1.2)
Total Protein: 5.8 g/dL — ABNORMAL LOW (ref 6.5–8.1)

## 2023-10-18 LAB — CBC WITH DIFFERENTIAL/PLATELET
Abs Immature Granulocytes: 0.02 10*3/uL (ref 0.00–0.07)
Basophils Absolute: 0 10*3/uL (ref 0.0–0.1)
Basophils Relative: 0 %
Eosinophils Absolute: 0 10*3/uL (ref 0.0–0.5)
Eosinophils Relative: 1 %
HCT: 38.9 % (ref 36.0–46.0)
Hemoglobin: 13 g/dL (ref 12.0–15.0)
Immature Granulocytes: 0 %
Lymphocytes Relative: 38 %
Lymphs Abs: 3.3 10*3/uL (ref 0.7–4.0)
MCH: 31.2 pg (ref 26.0–34.0)
MCHC: 33.4 g/dL (ref 30.0–36.0)
MCV: 93.3 fL (ref 80.0–100.0)
Monocytes Absolute: 0.6 10*3/uL (ref 0.1–1.0)
Monocytes Relative: 7 %
Neutro Abs: 4.7 10*3/uL (ref 1.7–7.7)
Neutrophils Relative %: 54 %
Platelets: 312 10*3/uL (ref 150–400)
RBC: 4.17 MIL/uL (ref 3.87–5.11)
RDW: 14.1 % (ref 11.5–15.5)
WBC: 8.6 10*3/uL (ref 4.0–10.5)
nRBC: 0 % (ref 0.0–0.2)

## 2023-10-18 MED ORDER — ACETAMINOPHEN 325 MG PO TABS
650.0000 mg | ORAL_TABLET | ORAL | Status: DC
Start: 2023-10-18 — End: 2024-01-10

## 2023-10-18 MED ORDER — INFLIXIMAB 100 MG IV SOLR
3.0000 mg/kg | INTRAVENOUS | Status: DC
  Administered 2023-10-18: 200 mg via INTRAVENOUS
  Filled 2023-10-18: qty 20

## 2023-10-18 MED ORDER — DIPHENHYDRAMINE HCL 25 MG PO CAPS: 25.0000 mg | ORAL_CAPSULE | ORAL | Status: DC

## 2023-10-18 NOTE — Progress Notes (Signed)
Glucose is elevated- 176.  Calcium and total protein remain low.  Please clarify if she is taking a calcium supplement?  Potassium is also low and trending down over past few months--please forward results to PCP.

## 2023-10-18 NOTE — Progress Notes (Signed)
CBC WNL

## 2023-10-27 ENCOUNTER — Ambulatory Visit: Payer: Medicare Other | Attending: Rheumatology | Admitting: Physician Assistant

## 2023-10-27 ENCOUNTER — Encounter: Payer: Self-pay | Admitting: Physician Assistant

## 2023-10-27 VITALS — BP 113/67 | HR 61 | Resp 12 | Ht 63.0 in | Wt 130.0 lb

## 2023-10-27 DIAGNOSIS — Z9889 Other specified postprocedural states: Secondary | ICD-10-CM | POA: Diagnosis present

## 2023-10-27 DIAGNOSIS — M51369 Other intervertebral disc degeneration, lumbar region without mention of lumbar back pain or lower extremity pain: Secondary | ICD-10-CM | POA: Diagnosis present

## 2023-10-27 DIAGNOSIS — I10 Essential (primary) hypertension: Secondary | ICD-10-CM

## 2023-10-27 DIAGNOSIS — M19042 Primary osteoarthritis, left hand: Secondary | ICD-10-CM

## 2023-10-27 DIAGNOSIS — Z79899 Other long term (current) drug therapy: Secondary | ICD-10-CM

## 2023-10-27 DIAGNOSIS — I471 Supraventricular tachycardia, unspecified: Secondary | ICD-10-CM

## 2023-10-27 DIAGNOSIS — H209 Unspecified iridocyclitis: Secondary | ICD-10-CM | POA: Diagnosis not present

## 2023-10-27 DIAGNOSIS — R918 Other nonspecific abnormal finding of lung field: Secondary | ICD-10-CM | POA: Diagnosis present

## 2023-10-27 DIAGNOSIS — D869 Sarcoidosis, unspecified: Secondary | ICD-10-CM

## 2023-10-27 DIAGNOSIS — R202 Paresthesia of skin: Secondary | ICD-10-CM | POA: Diagnosis present

## 2023-10-27 DIAGNOSIS — M1712 Unilateral primary osteoarthritis, left knee: Secondary | ICD-10-CM | POA: Diagnosis not present

## 2023-10-27 DIAGNOSIS — M19041 Primary osteoarthritis, right hand: Secondary | ICD-10-CM

## 2023-10-27 DIAGNOSIS — M170 Bilateral primary osteoarthritis of knee: Secondary | ICD-10-CM

## 2023-10-27 DIAGNOSIS — K219 Gastro-esophageal reflux disease without esophagitis: Secondary | ICD-10-CM | POA: Diagnosis not present

## 2023-11-02 ENCOUNTER — Ambulatory Visit: Payer: Medicare Other | Admitting: Primary Care

## 2023-11-02 ENCOUNTER — Encounter: Payer: Self-pay | Admitting: Primary Care

## 2023-11-02 VITALS — BP 122/78 | HR 64 | Temp 97.2°F | Ht 63.0 in | Wt 129.0 lb

## 2023-11-02 DIAGNOSIS — R7303 Prediabetes: Secondary | ICD-10-CM

## 2023-11-02 DIAGNOSIS — E1165 Type 2 diabetes mellitus with hyperglycemia: Secondary | ICD-10-CM

## 2023-11-02 DIAGNOSIS — R002 Palpitations: Secondary | ICD-10-CM | POA: Diagnosis not present

## 2023-11-02 DIAGNOSIS — E876 Hypokalemia: Secondary | ICD-10-CM

## 2023-11-02 DIAGNOSIS — G8929 Other chronic pain: Secondary | ICD-10-CM | POA: Diagnosis not present

## 2023-11-02 DIAGNOSIS — R1031 Right lower quadrant pain: Secondary | ICD-10-CM | POA: Diagnosis not present

## 2023-11-02 LAB — POCT GLYCOSYLATED HEMOGLOBIN (HGB A1C): Hemoglobin A1C: 6.5 % — AB (ref 4.0–5.6)

## 2023-11-02 LAB — POTASSIUM: Potassium: 3.9 meq/L (ref 3.5–5.1)

## 2023-11-02 LAB — TSH: TSH: 2.09 u[IU]/mL (ref 0.35–5.50)

## 2023-11-02 NOTE — Assessment & Plan Note (Addendum)
 New diagnosis as of today, discussed in detail with patient.  She is serious about improving her lifestyle, has already cut back on sugary foods. Continue to work on diet.  Increase protein, fresh fruits and vegetables.  Will defer medication management at this time.  Close follow-up in 3 months for repeat A1c.

## 2023-11-02 NOTE — Progress Notes (Signed)
 Subjective:    Patient ID: Renee Jones, female    DOB: [birth year], 73 y.o.   MRN: [id number]  HPI  Eliese Kerwood is a very pleasant 73 y.o. female with a history of hypertension, SVT, sarcoidosis, prediabetes, hyperlipidemia who presents today to discuss several concerns.  1) Chest Vibrations: Following with cardiology. Over the last 3 weeks she's noticed a constant vibration of her left chest. Symptoms occur at rest and exertion, lasting several seconds.   She denies increased stress/anxiety, chest pain, shortness of breath, esophageal burning. She is compliant to her pantoprazole  to 40 mg daily which has helped with overall heartburn.   She contacted her cardiologist's office this morning, is waiting to hear back.   2) Hypokalemia: Evaluated by rheumatology in December 2024 in January 2025 for medication monitoring of Remicade  IV infusion every 6 weeks and methotrexate  15 mg weekly.  Lab work obtained in December 2024 showed mild hypokalemia with potassium of 3.4.  No prior history of chronic hypokalemia.  Glucose level was 176.  She was advised to discuss mild hypokalemia with PCP.  She has a history of prediabetes.  A1c from August 2024 was 6.3.  She is not managed on diuretic therapy.  She has since increased intake of dietary potassium in the form of bananas. She's reduced intake of added sugars as of 10/18/23.  3) Groin Pain: Chronic since Summer 2024. Located to the right groin/pelvic region. Symptoms are intermittent, occurs with coughing, or certain movements. She underwent partial hysterectomy years ago, has both ovaries.   She has a history of constipation, follows with GI, is managed on 145 mcg of Linzess now, is having bowel movements every other day.    Wt Readings from Last 3 Encounters:  11/02/23 129 lb (58.5 kg)  10/27/23 130 lb (59 kg)  10/18/23 132 lb (59.9 kg)     Review of Systems  Constitutional:  Negative for diaphoresis and fatigue.   Respiratory:  Negative for shortness of breath.   Cardiovascular:  Positive for palpitations. Negative for chest pain.  Gastrointestinal:  Positive for constipation.       Right groin pain  Genitourinary:  Positive for pelvic pain.  Neurological:  Negative for dizziness.         Past Medical History:  Diagnosis Date   Allergy    Asthma    Bronchial spasm 09/14/2016   DDD (degenerative disc disease), lumbar 09/14/2016   Detached retina 09/14/2016   Diastolic dysfunction 08/28/2015   Essential hypertension 08/28/2015   GERD (gastroesophageal reflux disease)    Glaucoma 09/14/2016   Neuromuscular disorder (HCC)    Osteoarthritis of both hands 09/14/2016   Osteoarthritis of both knees 09/14/2016   Pulmonary nodules 09/30/2012   Pure hypercholesterolemia 02/20/2021   Sarcoidosis    with +lymph node biopsy   SVT (supraventricular tachycardia) (HCC) 08/28/2015   Tachycardia    Uveitis     Social History   Socioeconomic History   Marital status: Widowed    Spouse name: Not on file   Number of children: Not on file   Years of education: Not on file   Highest education level: Not on file  Occupational History   Not on file  Tobacco Use   Smoking status: Never    Passive exposure: Never   Smokeless tobacco: Never  Vaping Use   Vaping status: Never Used  Substance and Sexual Activity   Alcohol use: No    Alcohol/week: 0.0 standard drinks of alcohol  Drug use: No   Sexual activity: Yes  Other Topics Concern   Not on file  Social History Narrative   Widower.   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   Moved from [place] .   Social Drivers of Corporate Investment Banker Strain: Low Risk  (04/14/2023)   Overall Financial Resource Strain (CARDIA)    Difficulty of Paying Living Expenses: Not hard at all  Food Insecurity: No Food Insecurity (04/14/2023)   Hunger Vital Sign    Worried About Running Out of Food in the Last Year: Never true    Ran Out of Food in the Last  Year: Never true  Transportation Needs: No Transportation Needs (04/14/2023)   PRAPARE - Administrator, Civil Service (Medical): No    Lack of Transportation (Non-Medical): No  Physical Activity: Sufficiently Active (04/14/2023)   Exercise Vital Sign    Days of Exercise per Week: 3 days    Minutes of Exercise per Session: 60 min  Stress: No Stress Concern Present (04/14/2023)   [employer] of Occupational Health - Occupational Stress Questionnaire    Feeling of Stress : Not at all  Social Connections: Moderately Integrated (04/14/2023)   Social Connection and Isolation Panel [NHANES]    Frequency of Communication with Friends and Family: More than three times a week    Frequency of Social Gatherings with Friends and Family: More than three times a week    Attends Religious Services: More than 4 times per year    Active Member of [employer] or Organizations: Yes    Attends Banker Meetings: More than 4 times per year    Marital Status: Widowed  Intimate Partner Violence: Not At Risk (04/14/2023)   Humiliation, Afraid, Rape, and Kick questionnaire    Fear of Current or Ex-Partner: No    Emotionally Abused: No    Physically Abused: No    Sexually Abused: No    Past Surgical History:  Procedure Laterality Date   CATARACT EXTRACTION, BILATERAL  2018   CYSTECTOMY     EYE SURGERY Bilateral 2018   cataract removal    LUMBAR SPINE SURGERY  2009   ROTATOR CUFF REPAIR Right 2011   ROTATOR CUFF REPAIR Left 06/2018   TONSILLECTOMY AND ADENOIDECTOMY  1962   TOTAL ABDOMINAL HYSTERECTOMY  1984    Family History  Adopted: Yes  Problem Relation Age of Onset   Scleroderma Daughter     Allergies  Allergen Reactions   Codeine Itching and Hives    Other reaction(s): itching internally   Amoxicillin-Pot Clavulanate Diarrhea   Erythromycin Base    Other     Other reaction(s): Unknown Other reaction(s): itching internally   Sulfa Antibiotics Itching     vomiting    Tramadol Itching   Tramadol Hcl     Other reaction(s): hives   Erythromycin Itching and Hives    Other reaction(s): itching internally    Current Outpatient Medications on File Prior to Visit  Medication Sig Dispense Refill   albuterol  (VENTOLIN  HFA) 108 (90 Base) MCG/ACT inhaler Inhale 1-2 puffs into the lungs every 6 (six) hours as needed (cough). 6.7 g 0   amLODipine  (NORVASC ) 5 MG tablet Take 1 tablet (5 mg total) by mouth daily. for blood pressure. 90 tablet 2   benzonatate  (TESSALON  PERLES) 100 MG capsule 1-2 capsules up to twice daily as needed for cough. 30 capsule 0   cyclobenzaprine  (FLEXERIL ) 5 MG tablet Take 1 tablet (5 mg total) by  mouth at bedtime as needed for muscle spasms. 30 tablet 0   fluticasone (FLONASE) 50 MCG/ACT nasal spray Place 2 sprays into both nostrils daily as needed for allergies or rhinitis.     folic acid  (FOLVITE ) 1 MG tablet TAKE 2 TABLETS(2 MG) BY MOUTH DAILY 180 tablet 3   InFLIXimab  (REMICADE  IV) Inject into the vein every 6 (six) weeks.     methotrexate  (RHEUMATREX) 2.5 MG tablet Take 6 tablets (15 mg total) by mouth once a week. 72 tablet 0   metoprolol  tartrate (LOPRESSOR ) 50 MG tablet Take 1.5 tablets (75 mg total) by mouth 2 (two) times daily. 270 tablet 0   ondansetron  (ZOFRAN ) 4 MG tablet Take 1 tablet (4 mg total) by mouth every 6 (six) hours as needed. 30 tablet 0   pantoprazole  (PROTONIX ) 40 MG tablet TAKE 1 TABLET BY MOUTH ONCE DAILY for heartburn 90 tablet 2   pravastatin  (PRAVACHOL ) 40 MG tablet TAKE 1 TABLET(40 MG) BY MOUTH DAILY FOR CHOLESTEROL 90 tablet 3   SYSTANE ULTRA 0.4-0.3 % SOLN SMARTSIG:1 Drop(s) In Eye(s) 6 Times Daily     triazolam (HALCION) 0.25 MG tablet 0.125 mg.     VITAMIN D PO Take 1,000 Units by mouth daily.     No current facility-administered medications on file prior to visit.    BP 122/78   Pulse 64   Temp (!) 97.2 F (36.2 C) (Temporal)   Ht 5' 3 (1.6 m)   Wt 129 lb (58.5 kg)   SpO2 99%   BMI 22.85  kg/m  Objective:   Physical Exam Cardiovascular:     Rate and Rhythm: Normal rate and regular rhythm.  Pulmonary:     Effort: Pulmonary effort is normal.     Breath sounds: Normal breath sounds.  Abdominal:     General: Bowel sounds are normal.     Palpations: Abdomen is soft.     Tenderness: There is no abdominal tenderness.     Hernia: There is no hernia in the left inguinal area or right inguinal area.  Musculoskeletal:     Cervical back: Neck supple.  Skin:    General: Skin is warm and dry.  Neurological:     Mental Status: She is alert and oriented to person, place, and time.  Psychiatric:        Mood and Affect: Mood normal.           Assessment & Plan:  Chronic groin pain, right Assessment & Plan: Differentials include inguinal hernia, ovarian cyst/mass.  Exam today more representative of inguinal hernia.  Will obtain ultrasound of the inguinal region. Consider transvaginal/pelvic ultrasound if results inconclusive.    Orders: -     US  Abdomen Limited; Future  Prediabetes Assessment & Plan: New diagnosis as of today, discussed in detail with patient.  She is serious about improving her lifestyle, has already cut back on sugary foods. Continue to work on diet.  Increase protein, fresh fruits and vegetables.  Will defer medication management at this time.  Close follow-up in 3 months for repeat A1c.  Orders: -     POCT glycosylated hemoglobin (Hb A1C)  Palpitations Assessment & Plan: EKG today with normal sinus rhythm, rate of 63, no PAC or PVCs. Appears similar to ECG from August 2023.  Checking labs today including TSH, electrolytes. Reviewed CBC from 10/18/2023 per rheumatology. New onset of type 2 diabetes, diagnosed today.  Avoid caffeine.  She will continue to follow-up with her cardiologist.  Orders: -  EKG 12-Lead -     TSH  Type 2 diabetes mellitus with hyperglycemia, without long-term current use of insulin (HCC) Assessment  & Plan: New diagnosis as of today, discussed in detail with patient.  She is serious about improving her lifestyle, has already cut back on sugary foods. Continue to work on diet.  Increase protein, fresh fruits and vegetables.  Will defer medication management at this time.  Close follow-up in 3 months for repeat A1c.   Hypokalemia Assessment & Plan: After further evaluation today, hypokalemia could be secondary to new onset type 2 diabetes.  Discussed with patient.  She will continue to work on improving her diet. Repeat potassium level pending today.    Orders: -     Potassium        Glen Kesinger K Mariona Scholes, NP

## 2023-11-02 NOTE — Assessment & Plan Note (Addendum)
 EKG today with normal sinus rhythm, rate of 63, no PAC or PVCs. Appears similar to ECG from August 2023.  Checking labs today including TSH, electrolytes. Reviewed CBC from 10/18/2023 per rheumatology. New onset of type 2 diabetes, diagnosed today.  Avoid caffeine.  She will continue to follow-up with her cardiologist.

## 2023-11-02 NOTE — Assessment & Plan Note (Signed)
 After further evaluation today, hypokalemia could be secondary to new onset type 2 diabetes.  Discussed with patient.  She will continue to work on improving her diet. Repeat potassium level pending today.

## 2023-11-02 NOTE — Assessment & Plan Note (Signed)
 Differentials include inguinal hernia, ovarian cyst/mass.  Exam today more representative of inguinal hernia.  Will obtain ultrasound of the inguinal region. Consider transvaginal/pelvic ultrasound if results inconclusive.

## 2023-11-02 NOTE — Patient Instructions (Addendum)
 Stop by the lab prior to leaving today. I will notify you of your results once received.   You will be contacted via phone regarding your ultrasound of the groin.  It is important that you improve your diet. Please limit carbohydrates in the form of white bread, rice, pasta, sweets, fast food, fried food, sugary drinks, etc. Increase your consumption of fresh fruits and vegetables, whole grains, lean protein.  Ensure you are consuming 64 ounces of water daily.  Please schedule a follow up visit for 3 months for diabetes check.  It was a pleasure to see you today!

## 2023-11-04 ENCOUNTER — Telehealth: Payer: Self-pay | Admitting: Cardiovascular Disease

## 2023-11-04 ENCOUNTER — Other Ambulatory Visit: Payer: Self-pay | Admitting: *Deleted

## 2023-11-04 ENCOUNTER — Other Ambulatory Visit (HOSPITAL_COMMUNITY): Payer: Self-pay

## 2023-11-04 MED ORDER — METHOTREXATE SODIUM 2.5 MG PO TABS: 15.0000 mg | ORAL_TABLET | ORAL | 0 refills | Status: DC

## 2023-11-04 MED ORDER — METOPROLOL TARTRATE 50 MG PO TABS
75.0000 mg | ORAL_TABLET | Freq: Two times a day (BID) | ORAL | 0 refills | Status: DC
  Filled 2023-11-04: qty 270, 90d supply, fill #0

## 2023-11-04 NOTE — Telephone Encounter (Signed)
*  STAT* If patient is at the pharmacy, call can be transferred to refill team.   1. Which medications need to be refilled? (please list name of each medication and dose if known) metoprolol tartrate (LOPRESSOR) 50 MG tablet   2. Which pharmacy/location (including street and city if local pharmacy) is medication to be sent to?  [id number] - [place], [place] - [address]      3. Do they need a 30 day or 90 day supply? 90 day   Pt is out of medication and has an office visit scheduled for 12/14/23.

## 2023-11-04 NOTE — Telephone Encounter (Signed)
Refill request received via fax from [facility] [address] [employer]  for Methotrexate  Last Fill: 08/26/2023  Labs: 10/18/2023 Glucose is elevated- 176.  Calcium and total protein remain low. Potassium is also low and trending down over past few months   Next Visit: 04/12/2024  Last Visit: 10/27/2023  DX: Sarcoidosis   Current Dose per office note 10/27/2023: methotrexate 2.5 mg 6 tablets by mouth once weekly every 7 days   Okay to refill Methotrexate?

## 2023-11-16 ENCOUNTER — Ambulatory Visit: Payer: Medicare Other

## 2023-11-16 ENCOUNTER — Ambulatory Visit
Admission: RE | Admit: 2023-11-16 | Discharge: 2023-11-16 | Disposition: A | Discharge status: Home / Self Care | Payer: Medicare Other | Source: Ambulatory Visit | Attending: Primary Care | Admitting: Primary Care

## 2023-11-16 DIAGNOSIS — G8929 Other chronic pain: Secondary | ICD-10-CM | POA: Insufficient documentation

## 2023-11-16 DIAGNOSIS — R1031 Right lower quadrant pain: Secondary | ICD-10-CM | POA: Insufficient documentation

## 2023-11-17 ENCOUNTER — Other Ambulatory Visit (HOSPITAL_COMMUNITY): Payer: Self-pay

## 2023-11-19 NOTE — Telephone Encounter (Signed)
ATC patient regarding insurance status for 2025 calendar year. Left VM requesting confirmation that she has or has not had change in insurance  Chesley Mires, PharmD, MPH, BCPS, CPP Clinical Pharmacist (Rheumatology and Pulmonology)

## 2023-11-25 ENCOUNTER — Other Ambulatory Visit: Payer: Self-pay

## 2023-11-25 DIAGNOSIS — I10 Essential (primary) hypertension: Secondary | ICD-10-CM

## 2023-11-25 MED ORDER — AMLODIPINE BESYLATE 5 MG PO TABS: 5.0000 mg | ORAL_TABLET | Freq: Every day | ORAL | 2 refills | Status: DC

## 2023-11-29 ENCOUNTER — Ambulatory Visit (HOSPITAL_COMMUNITY)
Admission: RE | Admit: 2023-11-29 | Discharge: 2023-11-29 | Disposition: A | Discharge status: Home / Self Care | Payer: Medicare Other | Source: Ambulatory Visit | Attending: Rheumatology | Admitting: Rheumatology

## 2023-11-29 ENCOUNTER — Other Ambulatory Visit: Payer: Self-pay | Admitting: Pharmacist

## 2023-11-29 DIAGNOSIS — Z79899 Other long term (current) drug therapy: Secondary | ICD-10-CM | POA: Insufficient documentation

## 2023-11-29 DIAGNOSIS — H209 Unspecified iridocyclitis: Secondary | ICD-10-CM | POA: Insufficient documentation

## 2023-11-29 DIAGNOSIS — D869 Sarcoidosis, unspecified: Secondary | ICD-10-CM | POA: Diagnosis not present

## 2023-11-29 LAB — CBC WITH DIFFERENTIAL/PLATELET
Abs Immature Granulocytes: 0.01 10*3/uL (ref 0.00–0.07)
Basophils Absolute: 0 10*3/uL (ref 0.0–0.1)
Basophils Relative: 0 %
Eosinophils Absolute: 0.1 10*3/uL (ref 0.0–0.5)
Eosinophils Relative: 1 %
HCT: 41.2 % (ref 36.0–46.0)
Hemoglobin: 14.1 g/dL (ref 12.0–15.0)
Immature Granulocytes: 0 %
Lymphocytes Relative: 45 %
Lymphs Abs: 3.7 10*3/uL (ref 0.7–4.0)
MCH: 31.9 pg (ref 26.0–34.0)
MCHC: 34.2 g/dL (ref 30.0–36.0)
MCV: 93.2 fL (ref 80.0–100.0)
Monocytes Absolute: 0.6 10*3/uL (ref 0.1–1.0)
Monocytes Relative: 7 %
Neutro Abs: 3.9 10*3/uL (ref 1.7–7.7)
Neutrophils Relative %: 47 %
Platelets: 326 10*3/uL (ref 150–400)
RBC: 4.42 MIL/uL (ref 3.87–5.11)
RDW: 13.4 % (ref 11.5–15.5)
WBC: 8.2 10*3/uL (ref 4.0–10.5)
nRBC: 0 % (ref 0.0–0.2)

## 2023-11-29 LAB — COMPREHENSIVE METABOLIC PANEL
ALT: 19 U/L (ref 0–44)
AST: 26 U/L (ref 15–41)
Albumin: 3.8 g/dL (ref 3.5–5.0)
Alkaline Phosphatase: 50 U/L (ref 38–126)
Anion gap: 7 (ref 5–15)
BUN: 12 mg/dL (ref 8–23)
CO2: 24 mmol/L (ref 22–32)
Calcium: 8.9 mg/dL (ref 8.9–10.3)
Chloride: 111 mmol/L (ref 98–111)
Creatinine, Ser: 0.84 mg/dL (ref 0.44–1.00)
GFR, Estimated: >60 mL/min (ref >60)
Glucose, Bld: 112 mg/dL — ABNORMAL HIGH (ref 70–99)
Potassium: 4.2 mmol/L (ref 3.5–5.1)
Sodium: 142 mmol/L (ref 135–145)
Total Bilirubin: 0.8 mg/dL (ref 0.0–1.2)
Total Protein: 6.2 g/dL — ABNORMAL LOW (ref 6.5–8.1)

## 2023-11-29 MED ORDER — ACETAMINOPHEN 325 MG PO TABS
650.0000 mg | ORAL_TABLET | ORAL | Status: DC
Start: 2023-11-29 — End: 2024-02-21

## 2023-11-29 MED ORDER — DIPHENHYDRAMINE HCL 25 MG PO CAPS: 25.0000 mg | ORAL_CAPSULE | ORAL | Status: DC

## 2023-11-29 MED ORDER — SODIUM CHLORIDE 0.9 % IV SOLN
3.0000 mg/kg | INTRAVENOUS | Status: DC
  Administered 2023-11-29: 200 mg via INTRAVENOUS
  Filled 2023-11-29: qty 20

## 2023-11-29 NOTE — Progress Notes (Signed)
 Next infusion scheduled for Remicade (Z6109) on 01/10/2024 and due for updated orders. Diagnosis: uveitis; sarcoidosis  Dose: 3mg /kg every 6 weeks   Last Clinic Visit: 10/27/2023 Next Clinic Visit: 04/12/2024  Last infusion: 11/29/2023  Labs: CBC and CMP on 11/29/2023 TB Gold: negative on 06/14/2023   Orders placed for Remicade (J1745) x 2 doses along with premedication of acetaminophen and diphenhydramine to be administered 30 minutes before medication infusion.  Standing CBC with diff/platelet and CMP with GFR orders placed to be drawn every other infusion.  Next TB gold due 06/14/2023  Chesley Mires, PharmD, MPH, BCPS, CPP Clinical Pharmacist (Rheumatology and Pulmonology)

## 2023-12-10 ENCOUNTER — Encounter (HOSPITAL_BASED_OUTPATIENT_CLINIC_OR_DEPARTMENT_OTHER): Payer: Self-pay

## 2023-12-14 ENCOUNTER — Ambulatory Visit (INDEPENDENT_AMBULATORY_CARE_PROVIDER_SITE_OTHER): Payer: Medicare Other | Admitting: Cardiovascular Disease

## 2023-12-14 ENCOUNTER — Encounter (HOSPITAL_BASED_OUTPATIENT_CLINIC_OR_DEPARTMENT_OTHER): Payer: Self-pay | Admitting: Cardiovascular Disease

## 2023-12-14 ENCOUNTER — Other Ambulatory Visit (HOSPITAL_BASED_OUTPATIENT_CLINIC_OR_DEPARTMENT_OTHER): Payer: Medicare Other

## 2023-12-14 VITALS — BP 110/50 | HR 65 | Ht 63.0 in | Wt 125.0 lb

## 2023-12-14 DIAGNOSIS — I471 Supraventricular tachycardia, unspecified: Secondary | ICD-10-CM

## 2023-12-14 DIAGNOSIS — I10 Essential (primary) hypertension: Secondary | ICD-10-CM | POA: Diagnosis not present

## 2023-12-14 DIAGNOSIS — E7800 Pure hypercholesterolemia, unspecified: Secondary | ICD-10-CM

## 2023-12-14 DIAGNOSIS — E1165 Type 2 diabetes mellitus with hyperglycemia: Secondary | ICD-10-CM | POA: Diagnosis not present

## 2023-12-14 DIAGNOSIS — D869 Sarcoidosis, unspecified: Secondary | ICD-10-CM | POA: Diagnosis not present

## 2023-12-14 MED ORDER — BENZONATATE 100 MG PO CAPS: ORAL_CAPSULE | ORAL | 3 refills | Status: AC

## 2023-12-14 NOTE — Patient Instructions (Signed)
 Medication Instructions:  Your physician recommends that you continue on your current medications as directed. Please refer to the Current Medication list given to you today.   Testing/Procedures: Your physician has recommended that you wear a Zio monitor.   This monitor is a medical device that records the heart's electrical activity. Doctors most often use these monitors to diagnose arrhythmias. Arrhythmias are problems with the speed or rhythm of the heartbeat. The monitor is a small device applied to your chest. You can wear one while you do your normal daily activities. While wearing this monitor if you have any symptoms to push the button and record what you felt. Once you have worn this monitor for the period of time provider prescribed (Usually 14 days), you will return the monitor device in the postage paid box. Once it is returned they will download the data collected and provide [place] with a report which the provider will then review and we will call you with those results. Important tips:  Avoid showering during the first 24 hours of wearing the monitor. Avoid excessive sweating to help maximize wear time. Do not submerge the device, no hot tubs, and no swimming pools. Keep any lotions or oils away from the patch. After 24 hours you may shower with the patch on. Take brief showers with your back facing the shower head.  Do not remove patch once it has been placed because that will interrupt data and decrease adhesive wear time. Push the button when you have any symptoms and write down what you were feeling. Once you have completed wearing your monitor, remove and place into box which has postage paid and place in your outgoing mailbox.  If for some reason you have misplaced your box then call our office and we can provide another box and/or mail it off for you.  Follow-Up: At [facility], you and your health needs are our priority.  As part of our continuing mission to provide  you with exceptional heart care, we have created designated Provider Care Teams.  These Care Teams include your primary Cardiologist (physician) and Advanced Practice Providers (APPs -  Physician Assistants and Nurse Practitioners) who all work together to provide you with the care you need, when you need it.  We recommend signing up for the patient portal called "MyChart".  Sign up information is provided on this After Visit Summary.  MyChart is used to connect with patients for Virtual Visits (Telemedicine).  Patients are able to view lab/test results, encounter notes, upcoming appointments, etc.  Non-urgent messages can be sent to your provider as well.   To learn more about what you can do with MyChart, go to [email or web address].    Your next appointment:   1-2 months with Dr. Duke Salvia or Gillian Shields, NP

## 2023-12-14 NOTE — Progress Notes (Signed)
 Cardiology Office Note:  .   Date:  12/14/2023  ID:  Renee Jones, DOB May 09, 1951, MRN [id number] PCP: Doreene Nest, NP  [facility] Providers Cardiologist:  None    History of Present Illness: Renee Jones    Renee Jones is a 73 y.o. female with diabetes, paroxysmal SVT, HTN, GERD, and sarcoidosis here for follow up. Renee Jones reported episodes of SVT that have been occurring for as long as she can remember.  She previously took a beta blocker which helped her symptoms but stopped taking it 20 years ago because she didn't think she needed it anymore.  Renee Jones was previously a patient of Dr. Michaelle Copas and had a Holter monitor documenting SVT.  Renee Jones was evaluated in the ED 02/2015 with elevated BP to 197/120.   She had a cardiac MRI 10/2012 that did not reveal any delayed enhancement concerning for cardiac involvement of her sarcoid.  She underwent CPX testing to evaluate shortness of breath and it was suggestive of diastolic dysfunction.  She was started on metoprolol for SVT and it also helped her shortness of breath.  She was referred for an echo 08/2015 that revealed LVEF 55-60% with grade 1 diastolic dysfunction. She saw Dr. Elberta Fortis and has not been interested in ablation. Metoprolol was increased with an improvement in her symptoms.   Renee Jones reported increased episodes of palpitations.  However she misunderstood that her PCP recommended she stop hydrochlorothiazide and actually stopped the metoprolol instead.  It was recommended that she resume metoprolol.  Since then she is feeling much better.  She has very rare episodes of palpitations.  It happened once or twice in the last month and lasted less than a minute.  She is walking for exercise for 30 minutes daily.  She has no exertional symptoms or edema.  She checks her blood pressure and it is been consistently in the 110s to 120s and under 80 diastolic.  She had her knees injected and is feeling well.  She is  also doing intermittent fasting to lose the weight she gained during COVID.  She is very hesitant about getting the Covid vaccine.   Renee Jones's husband died in 01-13-2018.  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  Lately she has been feeling well.  At her last visit 05/2022 she was doing well.  She saw her PCP 10/2023 and reported increased palpitations.  Renee Jones experiences a vibrating sensation on the left side of her chest, described as similar to a cell phone vibrating. This occurs frequently, regardless of her position or activity level, and lasts about an hour. It is not associated with lightheadedness, dizziness, or shortness of breath and differs from her previous palpitations related to supraventricular tachycardia. She does not believe it is related to anxiety, although she has been experiencing anxiety recently.  She has a history of increased esophageal issues, leading to a recent adjustment in her medication due to difficulty eating and coughing after meals. Her sugar levels have been rising, prompting her to increase protein intake and take vitamin D supplements. She is actively managing her sugar levels through diet and exercise to avoid starting insulin.  She feels more tired than usual and has noticed weight loss, which her daughter also commented on. Her appetite is okay, and she remains diligent with her regular health screenings, including mammograms and colonoscopies, last done around  her birthday in [birth year].     ROS:  As per HPI  Studies Reviewed: Renee Jones       CPX 05/01/15: Conclusion: Exercise testing with gas-exchange demonstrates a mild functional impairment when compared to matched sedentary norms. There is no significant evidence of EIB. The combination of reduced PVO2, hypertensive BP, elevated VE/VCO2 slope, and flat O2 pulse suggests possible circulatory limitations (specifically  diastolic dysfunction).    2014 CMRI 1. Normal LV size and systolic function, EF 69%.   2. Normal RV size and systolic function.   3. No myocardial delayed enhancement, so no definitive evidence for prior MI, infiltrative disease, or myocarditis.   4. No evidence on this exam for cardiac sarcoidosis.  Risk Assessment/Calculations:             Physical Exam:   VS:  BP (!) 110/50   Pulse 65   Ht 5\' 3"  (1.6 m)   Wt 125 lb (56.7 kg)   SpO2 98%   BMI 22.14 kg/m  , BMI Body mass index is 22.14 kg/m. GENERAL:  Well appearing HEENT: Pupils equal round and reactive, fundi not visualized, oral mucosa unremarkable NECK:  No jugular venous distention, waveform within normal limits, carotid upstroke brisk and symmetric, no bruits, no thyromegaly LYMPHATICS:  No cervical adenopathy LUNGS:  Clear to auscultation bilaterally HEART:  RRR.  PMI not displaced or sustained,S1 and S2 within normal limits, no S3, no S4, no clicks, no rubs, no murmurs ABD:  Flat, positive bowel sounds normal in frequency in pitch, no bruits, no rebound, no guarding, no midline pulsatile mass, no hepatomegaly, no splenomegaly EXT:  2 plus pulses throughout, no edema, no cyanosis no clubbing SKIN:  No rashes no nodules NEURO:  Cranial nerves II through XII grossly intact, motor grossly intact throughout PSYCH:  Cognitively intact, oriented to person place and time  ASSESSMENT AND PLAN: .    # Chest Vibration Frequent episodes of chest vibration, different from previous palpitations. No associated lightheadedness, dizziness, or shortness of breath. Episodes occur at rest and with exertion. -Order ambulatory cardiac monitor to evaluate for arrhythmia during episodes. - CBC, BMP and thyroid are normal.   # Hypertension:  BP well-controlled on amlodipine and metoprolol.     # SVT:  Stable on metoprolol.   # Bronchitis Annual episodes of bronchitis. -Renewed prescription for Tessalon Perles as needed for  cough.  Follow-up in 1-2 months or sooner if monitor results are abnormal.      Signed, Chilton Si, MD

## 2023-12-20 ENCOUNTER — Other Ambulatory Visit: Payer: Self-pay

## 2023-12-20 NOTE — Telephone Encounter (Signed)
 Patient contacted the office to request a refill of Methotrexate sent to [facility] on [address]. Advised the patient a three month supply was sent in January. Patient states she never picked up that prescription because she didn't need a refill at that time. Patient state she is going on three weeks without the medication.   Last Fill: 11/04/2023  Labs: 11/29/2023 CBC WNL Glucose is 112. Total protein remains low but has improved.  Rest of CMP WNL  Next Visit: 04/12/2024  Last Visit: 10/27/2023  DX: Uveitis   Current Dose per office note 10/27/2023:  methotrexate 2.5 mg 6 tablets by mouth once weekly every 7 days   Okay to refill Methotrexate?

## 2023-12-21 MED ORDER — METHOTREXATE SODIUM 2.5 MG PO TABS: 15.0000 mg | ORAL_TABLET | ORAL | 0 refills | Status: DC

## 2023-12-29 DIAGNOSIS — I471 Supraventricular tachycardia, unspecified: Secondary | ICD-10-CM | POA: Diagnosis not present

## 2023-12-29 DIAGNOSIS — I10 Essential (primary) hypertension: Secondary | ICD-10-CM | POA: Diagnosis not present

## 2024-01-10 ENCOUNTER — Ambulatory Visit (HOSPITAL_COMMUNITY)
Admission: RE | Admit: 2024-01-10 | Discharge: 2024-01-10 | Disposition: A | Discharge status: Home / Self Care | Payer: Medicare Other | Source: Ambulatory Visit | Attending: Rheumatology | Admitting: Rheumatology

## 2024-01-10 DIAGNOSIS — H209 Unspecified iridocyclitis: Secondary | ICD-10-CM | POA: Insufficient documentation

## 2024-01-10 DIAGNOSIS — Z79899 Other long term (current) drug therapy: Secondary | ICD-10-CM | POA: Diagnosis not present

## 2024-01-10 DIAGNOSIS — D869 Sarcoidosis, unspecified: Secondary | ICD-10-CM | POA: Diagnosis not present

## 2024-01-10 MED ORDER — SODIUM CHLORIDE 0.9 % IV SOLN
3.0000 mg/kg | INTRAVENOUS | Status: DC
  Administered 2024-01-10: 200 mg via INTRAVENOUS
  Filled 2024-01-10: qty 20

## 2024-01-10 MED ORDER — ACETAMINOPHEN 325 MG PO TABS: 650.0000 mg | ORAL_TABLET | ORAL | Status: DC

## 2024-01-10 MED ORDER — DIPHENHYDRAMINE HCL 25 MG PO CAPS: 25.0000 mg | ORAL_CAPSULE | ORAL | Status: DC

## 2024-01-24 DIAGNOSIS — I471 Supraventricular tachycardia, unspecified: Secondary | ICD-10-CM

## 2024-01-24 DIAGNOSIS — I10 Essential (primary) hypertension: Secondary | ICD-10-CM

## 2024-02-01 ENCOUNTER — Ambulatory Visit: Payer: Medicare Other | Admitting: Primary Care

## 2024-02-02 NOTE — Progress Notes (Unsigned)
 Cardiology Office Note:  .   Date:  02/03/2024  ID:  [name], DOB 1950-12-09, MRN [id number] PCP: Gabriel John, NP  [facility] Providers Cardiologist:  None    Patient Profile: .      PMH Paroxysmal SVT Hypertension GERD Hyperlipidemia Adopted, does not know family history   Cardiac MRI 10/2013 with no evidence of delayed enhancement concerning for cardiac involvement of sarcoidosis.  Prior CPX suggestive of diastolic dysfunction.  Initially referred to cardiology and seen by Dr. Theodis Fiscal August 2016 for palpitations.  She reported recent rapid heart rate that seem to come out of nowhere.  Episodes occur 5-6 times daily and last for approximately 5 minutes.  Home HR noted at 122 bpm.  Holter monitor recorded SVT. Was diagnosed with myocarditis as a child.  Her husband died in 17-Feb-2018.  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  She has maintained consistent follow-up.  Last cardiology clinic visit was 12/14/2023 with Dr. Theodis Fiscal at which time she reported a vibrating sensation in her left chest, described as similar to a cell phone vibrating, different from previous palpitations. No lightheadedness, presyncope, or syncope associated with tachycardia.  CBC, BMP, and thyroid unremarkable.  10-day ZIO monitor revealed rare PACs and PVCs, predominant underlying rhythm sinus rhythm with average HR 68 bpm, no significant pauses.        History of Present Illness: .    History of Present Illness Renee Jones is a very pleasant 73 y.o. female who is here today for follow-up of palpitations. She reports increased palpitations after she took off the cardiac monitor. She reports history of myocarditis as a child, but does not know family history of heart disease because she is adopted. She continues to have a 'vibrating sensation' and sporadic chest discomfort. She describes the sensation of 'blood flowing through vessels but not  going through smoothly.' It is likened to a 'gurgling' sensation, similar to the feeling of a stomach gurgling or the sensation of milk filling up in a breast during nursing. This sensation is disruptive to sleep at times. She is active, walking frequently and doing other exercises such as squats. She was previously using a treadmill but was advised against it due to knee and back issues.  She has not had any increase in chest discomfort or palpitations with exertion. Correlation between these sensations and exercise. She lives alone and primarily eats chicken, fish, salads, and kale. She has cut out beef, pork, ice cream, and sodas from her diet, and is focusing on increasing her protein intake. She has also been working on improving her A1c levels. Her daughter worries that she does not eat enough.    Discussed the use of AI scribe software for clinical note transcription with the patient, who gave verbal consent to proceed.   ROS: See HPI       Studies Reviewed: Aaron Aas        No results found for: "LIPOA"   Risk Assessment/Calculations:             Physical Exam:   VS:  BP 126/74 (BP Location: Left Arm, Patient Position: Sitting, Cuff Size: Normal)   Pulse 68   Ht 5' 3.5" (1.613 m)   Wt 129 lb 6.4 oz (58.7 kg)   SpO2 98%   BMI 22.56 kg/m    Wt Readings from Last 3 Encounters:  02/03/24 129 lb 6.4 oz (58.7 kg)  01/10/24 127 lb (57.6 kg)  12/14/23 125 lb (56.7 kg)    GEN: Well nourished, well developed in no acute distress NECK: No JVD; No carotid bruits CARDIAC: RRR, soft systolic murmur. No rubs, gallops RESPIRATORY:  Clear to auscultation without rales, wheezing or rhonchi  ABDOMEN: Soft, non-tender, non-distended EXTREMITIES:  No edema; No deformity     ASSESSMENT AND PLAN: .    Assessment & Plan Palpitations/SVT Increased palpitations over the past several months. Reviewed monitor results in detail with < 1% PAC/PVC burden and no sustained episodes of SVT. We will get an  echocardiogram to assess heart structure and mitral valve function. Encouraged continued good hydration and dietary recommendations to increase protein and calorie intake. Consider increasing metoprolol dosage if symptoms worsen.   Mitral Valve Regurgitation   Mild MR on echo 08/2015. She has mild systolic murmur on exam. We will get echo to evaluate heart and valve function.   Hyperlipidemia She is unsure how long she has been on pravastatin for elevated cholesterol.  Most recent lipid panel 06/02/2023 with total cholesterol 159, triglycerides 138, HDL 49, and LDL 82.  She has been working on eating a healthier diet and eliminating soda and ice cream. Continue pravastatin.          Disposition:6 months with Dr. Theodis Fiscal or APP  Signed, Slater Duncan, NP-C

## 2024-02-03 ENCOUNTER — Ambulatory Visit (INDEPENDENT_AMBULATORY_CARE_PROVIDER_SITE_OTHER): Payer: Medicare Other | Admitting: Nurse Practitioner

## 2024-02-03 ENCOUNTER — Encounter (HOSPITAL_BASED_OUTPATIENT_CLINIC_OR_DEPARTMENT_OTHER): Payer: Self-pay | Admitting: Nurse Practitioner

## 2024-02-03 VITALS — BP 126/74 | HR 68 | Ht 63.5 in | Wt 129.4 lb

## 2024-02-03 DIAGNOSIS — I340 Nonrheumatic mitral (valve) insufficiency: Secondary | ICD-10-CM

## 2024-02-03 DIAGNOSIS — E785 Hyperlipidemia, unspecified: Secondary | ICD-10-CM | POA: Diagnosis not present

## 2024-02-03 DIAGNOSIS — R002 Palpitations: Secondary | ICD-10-CM

## 2024-02-03 DIAGNOSIS — I471 Supraventricular tachycardia, unspecified: Secondary | ICD-10-CM | POA: Diagnosis not present

## 2024-02-03 NOTE — Patient Instructions (Signed)
 Medication Instructions:   Your physician recommends that you continue on your current medications as directed. Please refer to the Current Medication list given to you today.   *If you need a refill on your cardiac medications before your next appointment, please call your pharmacy*  Lab Work:  None ordered.  If you have labs (blood work) drawn today and your tests are completely normal, you will receive your results only by: MyChart Message (if you have MyChart) OR A paper copy in the mail If you have any lab test that is abnormal or we need to change your treatment, we will call you to review the results.  Testing/Procedures:   Your physician has requested that you have an echocardiogram. Echocardiography is a painless test that uses sound waves to create images of your heart. It provides your doctor with information about the size and shape of your heart and how well your heart's chambers and valves are working. This procedure takes approximately one hour. There are no restrictions for this procedure. Please do NOT wear cologne, perfume, or lotions (deodorant is allowed). Please arrive 15 minutes prior to your appointment time.  Please note: We ask at that you not bring children with you during ultrasound (echo/ vascular) testing. Due to room size and safety concerns, children are not allowed in the ultrasound rooms during exams. Our front office staff cannot provide observation of children in our lobby area while testing is being conducted. An adult accompanying a patient to their appointment will only be allowed in the ultrasound room at the discretion of the ultrasound technician under special circumstances. We apologize for any inconvenience.   Follow-Up: At [facility], you and your health needs are our priority.  As part of our continuing mission to provide you with exceptional heart care, our providers are all part of one team.  This team includes your primary  Cardiologist (physician) and Advanced Practice Providers or APPs (Physician Assistants and Nurse Practitioners) who all work together to provide you with the care you need, when you need it.  Your next appointment:   6 month(s)  Provider:   Maudine Sos, MD, Slater Duncan, NP, or Neomi Banks, NP    We recommend signing up for the patient portal called "MyChart".  Sign up information is provided on this After Visit Summary.  MyChart is used to connect with patients for Virtual Visits (Telemedicine).  Patients are able to view lab/test results, encounter notes, upcoming appointments, etc.  Non-urgent messages can be sent to your provider as well.   To learn more about what you can do with MyChart, go to [email or web address].   Other Instructions  Your physician wants you to follow-up in: 6 months.  You will receive a reminder letter in the mail two months in advance. If you don't receive a letter, please call our office to schedule the follow-up appointment.

## 2024-02-08 ENCOUNTER — Ambulatory Visit: Admitting: Primary Care

## 2024-02-17 ENCOUNTER — Ambulatory Visit: Admitting: Primary Care

## 2024-02-17 ENCOUNTER — Encounter: Payer: Self-pay | Admitting: Primary Care

## 2024-02-17 VITALS — BP 142/80 | HR 59 | Temp 97.2°F | Ht 63.5 in | Wt 127.0 lb

## 2024-02-17 DIAGNOSIS — H2013 Chronic iridocyclitis, bilateral: Secondary | ICD-10-CM | POA: Diagnosis not present

## 2024-02-17 DIAGNOSIS — H40013 Open angle with borderline findings, low risk, bilateral: Secondary | ICD-10-CM | POA: Diagnosis not present

## 2024-02-17 DIAGNOSIS — H0102A Squamous blepharitis right eye, upper and lower eyelids: Secondary | ICD-10-CM | POA: Diagnosis not present

## 2024-02-17 DIAGNOSIS — Z961 Presence of intraocular lens: Secondary | ICD-10-CM | POA: Diagnosis not present

## 2024-02-17 DIAGNOSIS — R7303 Prediabetes: Secondary | ICD-10-CM

## 2024-02-17 DIAGNOSIS — E1165 Type 2 diabetes mellitus with hyperglycemia: Secondary | ICD-10-CM

## 2024-02-17 DIAGNOSIS — H0102B Squamous blepharitis left eye, upper and lower eyelids: Secondary | ICD-10-CM | POA: Diagnosis not present

## 2024-02-17 DIAGNOSIS — E119 Type 2 diabetes mellitus without complications: Secondary | ICD-10-CM | POA: Diagnosis not present

## 2024-02-17 LAB — HM DIABETES EYE EXAM

## 2024-02-17 LAB — POCT GLYCOSYLATED HEMOGLOBIN (HGB A1C): Hemoglobin A1C: 5.9 % — AB (ref 4.0–5.6)

## 2024-02-17 LAB — MICROALBUMIN / CREATININE URINE RATIO
Creatinine,U: 177.3 mg/dL
Microalb Creat Ratio: 8.5 mg/g (ref 0.0–30.0)
Microalb, Ur: 1.5 mg/dL (ref 0.0–1.9)

## 2024-02-17 NOTE — Assessment & Plan Note (Signed)
 Moderate improvement and controlled with A1C of 5.9 today!  Commended her on regular exercises and changes in her diet! Continue off treatment.   Foot exam today. Urine microalbumin pending today.  Follow up in 6 months.

## 2024-02-17 NOTE — Patient Instructions (Signed)
 Stop by the lab prior to leaving today. I will notify you of your results once received.   Please schedule a follow up visit for 6 months for a diabetes check.  It was a pleasure to see you today!

## 2024-02-17 NOTE — Progress Notes (Signed)
 Subjective:    Patient ID: Renee Jones, female    DOB: 1951/02/04, 73 y.o.   MRN: [id number]  HPI  Renee Jones is a very pleasant 73 y.o. female with a history of type 2 diabetes, hypertension, hyperlipidemia who presents today for follow-up of diabetes.  New onset of type 2 diabetes as of January 2025.  Current medications include: None.  She is checking her blood glucose 1 times daily and is getting readings of:  AM fasting: low 100s  Last A1C: 6.5 in January 2025, 5.9 today Last Eye Exam: Due Last Foot Exam: Due Pneumonia Vaccination: 2016 Urine Microalbumin: Due Statin: Pravastatin    Dietary changes since last visit: Unsweeted beverages. Increased protein. She's cut back on sodas and ice cream.    Exercise: Several times weekly   BP Readings from Last 3 Encounters:  02/17/24 (!) 142/80  02/03/24 126/74  01/10/24 125/64       Review of Systems  Eyes:  Negative for visual disturbance.  Respiratory:  Negative for shortness of breath.   Cardiovascular:  Negative for chest pain.  Neurological:  Negative for dizziness and numbness.         Past Medical History:  Diagnosis Date   Allergy    Asthma    Bronchial spasm 09/14/2016   DDD (degenerative disc disease), lumbar 09/14/2016   Detached retina 09/14/2016   Diastolic dysfunction 08/28/2015   Essential hypertension 08/28/2015   GERD (gastroesophageal reflux disease)    Glaucoma 09/14/2016   Neuromuscular disorder (HCC)    Osteoarthritis of both hands 09/14/2016   Osteoarthritis of both knees 09/14/2016   Pulmonary nodules 09/30/2012   Pure hypercholesterolemia 02/20/2021   Sarcoidosis    with +lymph node biopsy   SVT (supraventricular tachycardia) (HCC) 08/28/2015   Tachycardia    Uveitis     Social History   Socioeconomic History   Marital status: Widowed    Spouse name: Not on file   Number of children: Not on file   Years of education: Not on file   Highest education  level: Not on file  Occupational History   Not on file  Tobacco Use   Smoking status: Never    Passive exposure: Never   Smokeless tobacco: Never  Vaping Use   Vaping status: Never Used  Substance and Sexual Activity   Alcohol use: No    Alcohol/week: 0.0 standard drinks of alcohol   Drug use: No   Sexual activity: Yes  Other Topics Concern   Not on file  Social History Narrative   Widower.   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   Moved from [place] .   Social Drivers of Corporate investment banker Strain: Low Risk  (04/14/2023)   Overall Financial Resource Strain (CARDIA)    Difficulty of Paying Living Expenses: Not hard at all  Food Insecurity: No Food Insecurity (04/14/2023)   Hunger Vital Sign    Worried About Running Out of Food in the Last Year: Never true    Ran Out of Food in the Last Year: Never true  Transportation Needs: No Transportation Needs (04/14/2023)   PRAPARE - Administrator, Civil Service (Medical): No    Lack of Transportation (Non-Medical): No  Physical Activity: Sufficiently Active (04/14/2023)   Exercise Vital Sign    Days of Exercise per Week: 3 days    Minutes of Exercise per Session: 60 min  Stress: No Stress Concern Present (04/14/2023)   [employer] of Occupational Health -  Occupational Stress Questionnaire    Feeling of Stress : Not at all  Social Connections: Moderately Integrated (04/14/2023)   Social Connection and Isolation Panel [NHANES]    Frequency of Communication with Friends and Family: More than three times a week    Frequency of Social Gatherings with Friends and Family: More than three times a week    Attends Religious Services: More than 4 times per year    Active Member of [employer] or Organizations: Yes    Attends Banker Meetings: More than 4 times per year    Marital Status: Widowed  Intimate Partner Violence: Not At Risk (04/14/2023)   Humiliation, Afraid, Rape, and Kick questionnaire    Fear of  Current or Ex-Partner: No    Emotionally Abused: No    Physically Abused: No    Sexually Abused: No    Past Surgical History:  Procedure Laterality Date   CATARACT EXTRACTION, BILATERAL  2018   CYSTECTOMY     EYE SURGERY Bilateral 2018   cataract removal    LUMBAR SPINE SURGERY  2009   ROTATOR CUFF REPAIR Right 2011   ROTATOR CUFF REPAIR Left 06/2018   TONSILLECTOMY AND ADENOIDECTOMY  1962   TOTAL ABDOMINAL HYSTERECTOMY  1984    Family History  Adopted: Yes  Problem Relation Age of Onset   Scleroderma Daughter     Allergies  Allergen Reactions   Codeine Itching and Hives    Other reaction(s): itching internally   Amoxicillin-Pot Clavulanate Diarrhea   Erythromycin Base    Other     Other reaction(s): Unknown Other reaction(s): itching internally   Sulfa Antibiotics Itching     vomiting   Tramadol Itching   Tramadol Hcl     Other reaction(s): hives   Erythromycin Itching and Hives    Other reaction(s): itching internally    Current Outpatient Medications on File Prior to Visit  Medication Sig Dispense Refill   albuterol  (VENTOLIN  HFA) 108 (90 Base) MCG/ACT inhaler Inhale 1-2 puffs into the lungs every 6 (six) hours as needed (cough). 6.7 g 0   amLODipine  (NORVASC ) 5 MG tablet Take 1 tablet (5 mg total) by mouth daily. for blood pressure. 90 tablet 2   fluticasone (FLONASE) 50 MCG/ACT nasal spray Place 2 sprays into both nostrils daily as needed for allergies or rhinitis.     folic acid  (FOLVITE ) 1 MG tablet TAKE 2 TABLETS(2 MG) BY MOUTH DAILY 180 tablet 3   InFLIXimab  (REMICADE  IV) Inject into the vein every 6 (six) weeks.     methotrexate  (RHEUMATREX) 2.5 MG tablet Take 6 tablets (15 mg total) by mouth once a week. 72 tablet 0   metoprolol  tartrate (LOPRESSOR ) 50 MG tablet Take 1.5 tablets (75 mg total) by mouth 2 (two) times daily. Please keep upcoming visit for further refills. 270 tablet 0   ondansetron  (ZOFRAN ) 4 MG tablet Take 1 tablet (4 mg total) by mouth  every 6 (six) hours as needed. 30 tablet 0   pantoprazole  (PROTONIX ) 40 MG tablet TAKE 1 TABLET BY MOUTH ONCE DAILY for heartburn 90 tablet 2   pravastatin  (PRAVACHOL ) 40 MG tablet TAKE 1 TABLET(40 MG) BY MOUTH DAILY FOR CHOLESTEROL 90 tablet 3   SYSTANE ULTRA 0.4-0.3 % SOLN SMARTSIG:1 Drop(s) In Eye(s) 6 Times Daily     triazolam (HALCION) 0.25 MG tablet Take 0.125 mg by mouth at bedtime as needed.     VITAMIN D PO Take 1,000 Units by mouth daily.     benzonatate  (TESSALON   PERLES) 100 MG capsule 1-2 capsules up to twice daily as needed for cough. (Patient not taking: Reported on 02/17/2024) 30 capsule 3   No current facility-administered medications on file prior to visit.    BP (!) 142/80   Pulse (!) 59   Temp (!) 97.2 F (36.2 C) (Temporal)   Ht 5' 3.5" (1.613 m)   Wt 127 lb (57.6 kg)   SpO2 97%   BMI 22.14 kg/m  Objective:   Physical Exam Cardiovascular:     Rate and Rhythm: Normal rate and regular rhythm.  Pulmonary:     Effort: Pulmonary effort is normal.     Breath sounds: Normal breath sounds.  Musculoskeletal:     Cervical back: Neck supple.  Skin:    General: Skin is warm and dry.  Neurological:     Mental Status: She is alert and oriented to person, place, and time.  Psychiatric:        Mood and Affect: Mood normal.           Assessment & Plan:  Type 2 diabetes mellitus with hyperglycemia, without long-term current use of insulin (HCC) Assessment & Plan: Moderate improvement and controlled with A1C of 5.9 today!  Commended her on regular exercises and changes in her diet! Continue off treatment.   Foot exam today. Urine microalbumin pending today.  Follow up in 6 months.   Orders: -     Microalbumin / creatinine urine ratio  Prediabetes -     POCT glycosylated hemoglobin (Hb A1C)        Gabriel John, NP

## 2024-02-21 ENCOUNTER — Ambulatory Visit (HOSPITAL_COMMUNITY)
Admission: RE | Admit: 2024-02-21 | Discharge: 2024-02-21 | Disposition: A | Discharge status: Home / Self Care | Source: Ambulatory Visit | Attending: Rheumatology | Admitting: Rheumatology

## 2024-02-21 ENCOUNTER — Other Ambulatory Visit: Payer: Self-pay | Admitting: Pharmacist

## 2024-02-21 DIAGNOSIS — D869 Sarcoidosis, unspecified: Secondary | ICD-10-CM | POA: Insufficient documentation

## 2024-02-21 DIAGNOSIS — Z79899 Other long term (current) drug therapy: Secondary | ICD-10-CM | POA: Diagnosis not present

## 2024-02-21 DIAGNOSIS — Z111 Encounter for screening for respiratory tuberculosis: Secondary | ICD-10-CM

## 2024-02-21 DIAGNOSIS — H209 Unspecified iridocyclitis: Secondary | ICD-10-CM | POA: Diagnosis not present

## 2024-02-21 LAB — COMPREHENSIVE METABOLIC PANEL WITH GFR
ALT: 12 U/L (ref 0–44)
AST: 31 U/L (ref 15–41)
Albumin: 4 g/dL (ref 3.5–5.0)
Alkaline Phosphatase: 57 U/L (ref 38–126)
Anion gap: 6 (ref 5–15)
BUN: 13 mg/dL (ref 8–23)
CO2: 24 mmol/L (ref 22–32)
Calcium: 9.6 mg/dL (ref 8.9–10.3)
Chloride: 109 mmol/L (ref 98–111)
Creatinine, Ser: 0.89 mg/dL (ref 0.44–1.00)
GFR, Estimated: >60 mL/min (ref >60)
Glucose, Bld: 118 mg/dL — ABNORMAL HIGH (ref 70–99)
Potassium: 4.9 mmol/L (ref 3.5–5.1)
Sodium: 139 mmol/L (ref 135–145)
Total Bilirubin: 0.7 mg/dL (ref 0.0–1.2)
Total Protein: 6.6 g/dL (ref 6.5–8.1)

## 2024-02-21 LAB — CBC WITH DIFFERENTIAL/PLATELET
Abs Immature Granulocytes: 0.03 10*3/uL (ref 0.00–0.07)
Basophils Absolute: 0.1 10*3/uL (ref 0.0–0.1)
Basophils Relative: 1 %
Eosinophils Absolute: 0.1 10*3/uL (ref 0.0–0.5)
Eosinophils Relative: 1 %
HCT: 44.5 % (ref 36.0–46.0)
Hemoglobin: 14.9 g/dL (ref 12.0–15.0)
Immature Granulocytes: 0 %
Lymphocytes Relative: 49 %
Lymphs Abs: 4.6 10*3/uL — ABNORMAL HIGH (ref 0.7–4.0)
MCH: 31 pg (ref 26.0–34.0)
MCHC: 33.5 g/dL (ref 30.0–36.0)
MCV: 92.7 fL (ref 80.0–100.0)
Monocytes Absolute: 0.6 10*3/uL (ref 0.1–1.0)
Monocytes Relative: 7 %
Neutro Abs: 3.9 10*3/uL (ref 1.7–7.7)
Neutrophils Relative %: 42 %
Platelets: 305 10*3/uL (ref 150–400)
RBC: 4.8 MIL/uL (ref 3.87–5.11)
RDW: 12.8 % (ref 11.5–15.5)
WBC: 9.3 10*3/uL (ref 4.0–10.5)
nRBC: 0 % (ref 0.0–0.2)

## 2024-02-21 MED ORDER — SODIUM CHLORIDE 0.9 % IV SOLN
3.0000 mg/kg | INTRAVENOUS | Status: DC
  Administered 2024-02-21: 200 mg via INTRAVENOUS
  Filled 2024-02-21: qty 20

## 2024-02-21 MED ORDER — ACETAMINOPHEN 325 MG PO TABS: 650.0000 mg | ORAL_TABLET | ORAL | Status: DC

## 2024-02-21 MED ORDER — DIPHENHYDRAMINE HCL 25 MG PO CAPS: 25.0000 mg | ORAL_CAPSULE | ORAL | Status: DC

## 2024-02-21 NOTE — Progress Notes (Signed)
 Next infusion scheduled for Remicade  (Z6109) on 04/03/2024 and due for updated orders. Diagnosis: uveitis, sarcoidosis  Dose: 3mg /kg every 6 weeks   Last Clinic Visit: 10/27/2023 Next Clinic Visit: 04/12/2024  Last infusion: 02/21/24  Labs: CBC and CMP on 02/21/2024 TB Gold: negative on 06/14/2023   Orders placed for Remicade  (J1745) x 2 doses along with premedication of acetaminophen  and diphenhydramine  to be administered 30 minutes before medication infusion.  Standing CBC with diff/platelet and CMP with GFR orders placed to be drawn every 3 months.  Next TB gold due August 2025  Geraldene Kleine, PharmD, MPH, BCPS, CPP Clinical Pharmacist (Rheumatology and Pulmonology)

## 2024-02-21 NOTE — Progress Notes (Signed)
 CBC normal except lymphocyte count is mildly elevated.  Will continue to monitor.

## 2024-02-21 NOTE — Progress Notes (Signed)
 Glucose is mildly elevated, probably not a fasting sample.

## 2024-02-24 ENCOUNTER — Encounter (HOSPITAL_BASED_OUTPATIENT_CLINIC_OR_DEPARTMENT_OTHER): Payer: Self-pay

## 2024-02-24 ENCOUNTER — Ambulatory Visit (HOSPITAL_BASED_OUTPATIENT_CLINIC_OR_DEPARTMENT_OTHER)

## 2024-02-24 DIAGNOSIS — I471 Supraventricular tachycardia, unspecified: Secondary | ICD-10-CM

## 2024-02-24 DIAGNOSIS — I340 Nonrheumatic mitral (valve) insufficiency: Secondary | ICD-10-CM | POA: Diagnosis not present

## 2024-02-24 LAB — ECHOCARDIOGRAM COMPLETE
Area-P 1/2: 3.99 cm2
S' Lateral: 1.56 cm

## 2024-02-26 ENCOUNTER — Encounter (HOSPITAL_BASED_OUTPATIENT_CLINIC_OR_DEPARTMENT_OTHER): Payer: Self-pay | Admitting: Cardiovascular Disease

## 2024-03-22 ENCOUNTER — Ambulatory Visit (INDEPENDENT_AMBULATORY_CARE_PROVIDER_SITE_OTHER): Admitting: Primary Care

## 2024-03-22 ENCOUNTER — Encounter: Payer: Self-pay | Admitting: Primary Care

## 2024-03-22 VITALS — BP 146/78 | HR 88 | Temp 97.3°F | Ht 63.5 in | Wt 128.0 lb

## 2024-03-22 DIAGNOSIS — G44319 Acute post-traumatic headache, not intractable: Secondary | ICD-10-CM | POA: Diagnosis not present

## 2024-03-22 DIAGNOSIS — G44309 Post-traumatic headache, unspecified, not intractable: Secondary | ICD-10-CM | POA: Insufficient documentation

## 2024-03-22 HISTORY — DX: Post-traumatic headache, unspecified, not intractable: G44.309

## 2024-03-22 NOTE — Progress Notes (Addendum)
 Subjective:    Patient ID: Renee Jones, female    DOB: 06/15/1951, 73 y.o.   MRN: [id number]  Headache  Pertinent negatives include no dizziness, nausea or vomiting.    Renee Jones is a very pleasant 73 y.o. female with a history of hypertension, SVT, type 2 diabetes, osteoarthritis who presents today to discuss headache.  She accidentally hit her head on the garage door 2.5 weeks ago. The garage door hit her on the top of her head. Initially, she did notice head pressure to the bilateral parietal region. She did not experience dizziness, nausea, blurred vision.    About 1 week ago she began experiencing constant headaches to the left parietal lobe with radiation down to left occipital lobe. Headaches wax and wane. She describes her pain as a pulsation. She continues to denies dizziness, blurred vision, nausea.   She's been so anxious since her headaches began which have caused difficulty sleeping. She's been taking Tylenol  intermittently without much improvement.   Today she's feeling slightly better as the headache is not as intense. She is compliant to amlodipine .  She is not managed on blood thinners including aspirin .  BP Readings from Last 3 Encounters:  03/22/24 (!) 146/78  02/21/24 139/69  02/17/24 (!) 142/80      Review of Systems  Eyes:  Negative for visual disturbance.  Gastrointestinal:  Negative for nausea and vomiting.  Neurological:  Positive for headaches. Negative for dizziness and light-headedness.  Psychiatric/Behavioral:  Positive for sleep disturbance. The patient is nervous/anxious.          Past Medical History:  Diagnosis Date   Allergy    Asthma    Bronchial spasm 09/14/2016   DDD (degenerative disc disease), lumbar 09/14/2016   Detached retina 09/14/2016   Diastolic dysfunction 08/28/2015   Essential hypertension 08/28/2015   GERD (gastroesophageal reflux disease)    Glaucoma 09/14/2016   Neuromuscular disorder (HCC)     Osteoarthritis of both hands 09/14/2016   Osteoarthritis of both knees 09/14/2016   Pulmonary nodules 09/30/2012   Pure hypercholesterolemia 02/20/2021   Sarcoidosis    with +lymph node biopsy   SVT (supraventricular tachycardia) (HCC) 08/28/2015   Tachycardia    Uveitis     Social History   Socioeconomic History   Marital status: Widowed    Spouse name: Not on file   Number of children: Not on file   Years of education: Not on file   Highest education level: Not on file  Occupational History   Not on file  Tobacco Use   Smoking status: Never    Passive exposure: Never   Smokeless tobacco: Never  Vaping Use   Vaping status: Never Used  Substance and Sexual Activity   Alcohol use: No    Alcohol/week: 0.0 standard drinks of alcohol   Drug use: No   Sexual activity: Yes  Other Topics Concern   Not on file  Social History Narrative   Widower.   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   Moved from [place] .   Social Drivers of Corporate investment banker Strain: Low Risk  (04/14/2023)   Overall Financial Resource Strain (CARDIA)    Difficulty of Paying Living Expenses: Not hard at all  Food Insecurity: No Food Insecurity (04/14/2023)   Hunger Vital Sign    Worried About Running Out of Food in the Last Year: Never true    Ran Out of Food in the Last Year: Never true  Transportation Needs: No Transportation  Needs (04/14/2023)   PRAPARE - Administrator, Civil Service (Medical): No    Lack of Transportation (Non-Medical): No  Physical Activity: Sufficiently Active (04/14/2023)   Exercise Vital Sign    Days of Exercise per Week: 3 days    Minutes of Exercise per Session: 60 min  Stress: No Stress Concern Present (04/14/2023)   [employer] of Occupational Health - Occupational Stress Questionnaire    Feeling of Stress : Not at all  Social Connections: Moderately Integrated (04/14/2023)   Social Connection and Isolation Panel [NHANES]    Frequency of  Communication with Friends and Family: More than three times a week    Frequency of Social Gatherings with Friends and Family: More than three times a week    Attends Religious Services: More than 4 times per year    Active Member of [employer] or Organizations: Yes    Attends Banker Meetings: More than 4 times per year    Marital Status: Widowed  Intimate Partner Violence: Not At Risk (04/14/2023)   Humiliation, Afraid, Rape, and Kick questionnaire    Fear of Current or Ex-Partner: No    Emotionally Abused: No    Physically Abused: No    Sexually Abused: No    Past Surgical History:  Procedure Laterality Date   CATARACT EXTRACTION, BILATERAL  2018   CYSTECTOMY     EYE SURGERY Bilateral 2018   cataract removal    LUMBAR SPINE SURGERY  2009   ROTATOR CUFF REPAIR Right 2011   ROTATOR CUFF REPAIR Left 06/2018   TONSILLECTOMY AND ADENOIDECTOMY  1962   TOTAL ABDOMINAL HYSTERECTOMY  1984    Family History  Adopted: Yes  Problem Relation Age of Onset   Scleroderma Daughter     Allergies  Allergen Reactions   Codeine Itching and Hives    Other reaction(s): itching internally   Amoxicillin-Pot Clavulanate Diarrhea   Erythromycin Base    Other     Other reaction(s): Unknown Other reaction(s): itching internally   Sulfa Antibiotics Itching     vomiting   Tramadol Itching   Tramadol Hcl     Other reaction(s): hives   Erythromycin Itching and Hives    Other reaction(s): itching internally    Current Outpatient Medications on File Prior to Visit  Medication Sig Dispense Refill   albuterol  (VENTOLIN  HFA) 108 (90 Base) MCG/ACT inhaler Inhale 1-2 puffs into the lungs every 6 (six) hours as needed (cough). 6.7 g 0   amLODipine  (NORVASC ) 5 MG tablet Take 1 tablet (5 mg total) by mouth daily. for blood pressure. 90 tablet 2   fluticasone (FLONASE) 50 MCG/ACT nasal spray Place 2 sprays into both nostrils daily as needed for allergies or rhinitis.     folic acid  (FOLVITE ) 1  MG tablet TAKE 2 TABLETS(2 MG) BY MOUTH DAILY 180 tablet 3   InFLIXimab  (REMICADE  IV) Inject into the vein every 6 (six) weeks.     methotrexate  (RHEUMATREX) 2.5 MG tablet Take 6 tablets (15 mg total) by mouth once a week. 72 tablet 0   metoprolol  tartrate (LOPRESSOR ) 50 MG tablet Take 1.5 tablets (75 mg total) by mouth 2 (two) times daily. Please keep upcoming visit for further refills. 270 tablet 0   ondansetron  (ZOFRAN ) 4 MG tablet Take 1 tablet (4 mg total) by mouth every 6 (six) hours as needed. 30 tablet 0   pantoprazole  (PROTONIX ) 40 MG tablet TAKE 1 TABLET BY MOUTH ONCE DAILY for heartburn 90 tablet 2  pravastatin  (PRAVACHOL ) 40 MG tablet TAKE 1 TABLET(40 MG) BY MOUTH DAILY FOR CHOLESTEROL 90 tablet 3   SYSTANE ULTRA 0.4-0.3 % SOLN SMARTSIG:1 Drop(s) In Eye(s) 6 Times Daily     VITAMIN D PO Take 1,000 Units by mouth daily.     benzonatate  (TESSALON  PERLES) 100 MG capsule 1-2 capsules up to twice daily as needed for cough. (Patient not taking: Reported on 03/22/2024) 30 capsule 3   triazolam (HALCION) 0.25 MG tablet Take 0.125 mg by mouth at bedtime as needed. (Patient not taking: Reported on 03/22/2024)     No current facility-administered medications on file prior to visit.    BP (!) 146/78   Pulse 88   Temp (!) 97.3 F (36.3 C) (Temporal)   Ht 5' 3.5" (1.613 m)   Wt 128 lb (58.1 kg)   SpO2 98%   BMI 22.32 kg/m  Objective:   Physical Exam HENT:     Right Ear: Tympanic membrane and ear canal normal.     Left Ear: Tympanic membrane and ear canal normal.  Eyes:     Extraocular Movements: Extraocular movements intact.     Pupils: Pupils are equal, round, and reactive to light.  Cardiovascular:     Rate and Rhythm: Normal rate and regular rhythm.  Pulmonary:     Effort: Pulmonary effort is normal.     Breath sounds: Normal breath sounds.  Musculoskeletal:     Cervical back: Neck supple.  Skin:    General: Skin is warm and dry.  Neurological:     Mental Status: She is alert  and oriented to person, place, and time.     Cranial Nerves: No cranial nerve deficit.     Coordination: Coordination normal.  Psychiatric:        Mood and Affect: Mood normal.           Assessment & Plan:  Acute post-traumatic headache, not intractable Assessment & Plan: Overall minor trauma based on HPI.  We had a long discussion today regarding her symptoms.  Fortunately, she has had no neurological deficits or other symptoms.  Also, headaches are improving. Neuro exam today unremarkable.  We discussed obtaining CT head we agreed to hold off as she was improving and had a normal neurological exam.  She will update regarding symptoms in 1 week.  If symptoms do not continue to improve or if they progress she will notify sooner.  We will also have her keep track of her blood pressure.          Erza Mothershead K Armanda Forand, NP

## 2024-03-22 NOTE — Assessment & Plan Note (Signed)
 Overall minor trauma based on HPI.  We had a long discussion today regarding her symptoms.  Fortunately, she has had no neurological deficits or other symptoms.  Also, headaches are improving. Neuro exam today unremarkable.  We discussed obtaining CT head we agreed to hold off as she was improving and had a normal neurological exam.  She will update regarding symptoms in 1 week.  If symptoms do not continue to improve or if they progress she will notify sooner.  We will also have her keep track of her blood pressure.

## 2024-03-29 NOTE — Progress Notes (Unsigned)
 Office Visit Note  Patient: Renee Jones             Date of Birth: 02/03/1951           MRN: [id number]             PCP: Gretta Comer POUR, NP Referring: Gretta Comer POUR, NP Visit Date: 04/12/2024 Occupation: @GUAROCC @  Subjective:  Arthralgias   History of Present Illness: Renee Jones is a 73 y.o. female with history of sarcoidosis and uveitis.  Patient remains on  Remicade  IV infusion 3 mg/kg every 6 weeks, methotrexate  2.5 mg 6 tablets by mouth once weekly every 7 days, and folic acid  1 mg 2 tablets daily.  She continues to tolerate combination therapy without any side effects and has not had any gaps in therapy.  She denies any recent uveitis flares.  She continues to have chronic eye dryness and has been using Systane eyedrops for relief.  She remains under the care of of [facility] yearly.  She denies any signs or symptoms of a sarcoidosis flare.  No new or worsening pulmonary symptoms.  No recent rashes.  She continues to experience intermittent arthralgias particularly in her shoulders, hands, and both knee joints.  She denies any joint swelling.  She is been taking Tylenol  arthritis as needed for symptomatic relief.  She has been starting to stretch every morning as well as walking in the evenings for exercise.  She occasionally has increased discomfort in the left knee and has been using Biofreeze and heat topically for relief.   Activities of Daily Living:  Patient reports morning stiffness for 1 minute.   Patient Denies nocturnal pain.  Difficulty dressing/grooming: Denies Difficulty climbing stairs: Denies Difficulty getting out of chair: Denies Difficulty using hands for taps, buttons, cutlery, and/or writing: Reports  Review of Systems  Constitutional: Negative.  Negative for fatigue.  HENT: Negative.  Negative for mouth sores and mouth dryness.   Eyes:  Positive for dryness.  Respiratory: Negative.  Negative for shortness of breath.    Cardiovascular:  Positive for chest pain. Negative for palpitations.  Gastrointestinal: Negative.  Negative for blood in stool, constipation and diarrhea.  Endocrine: Negative.  Negative for increased urination.  Genitourinary: Negative.  Negative for involuntary urination.  Musculoskeletal:  Positive for joint pain, joint pain and morning stiffness. Negative for gait problem, joint swelling, myalgias, muscle weakness, muscle tenderness and myalgias.  Skin: Negative.  Negative for color change, rash, hair loss and sensitivity to sunlight.  Allergic/Immunologic: Negative.  Negative for susceptible to infections.  Neurological:  Positive for headaches. Negative for dizziness.  Hematological: Negative.  Negative for swollen glands.  Psychiatric/Behavioral:  Negative for depressed mood and sleep disturbance. The patient is not nervous/anxious.     PMFS History:  Patient Active Problem List   Diagnosis Date Noted   Post-traumatic headache 03/22/2024   Palpitations 11/02/2023   Chronic groin pain, right 11/02/2023   Hypokalemia 11/02/2023   Persistent cough for 3 weeks or longer 03/09/2023   Cervical spondylosis 11/12/2021   Hyperlipidemia 02/20/2021   Type 2 diabetes mellitus with hyperglycemia (HCC) 12/21/2018   High risk medications (not anticoagulants) long-term use 01/13/2017   GERD (gastroesophageal reflux disease) 09/14/2016   Glaucoma 09/14/2016   Osteoarthritis of both hands 09/14/2016   Osteoarthritis of both knees 09/14/2016   DDD (degenerative disc disease), lumbar 09/14/2016   Uveitis 06/22/2016   SVT (supraventricular tachycardia) (HCC) 08/28/2015   Essential hypertension 08/28/2015   Diastolic dysfunction  08/28/2015   Sarcoidosis 09/30/2012   Tachycardia 09/30/2012   Sarcoid arthropathy 09/30/2012    Past Medical History:  Diagnosis Date   Allergy    Asthma    Bronchial spasm 09/14/2016   DDD (degenerative disc disease), lumbar 09/14/2016   Detached retina  09/14/2016   Diastolic dysfunction 08/28/2015   Essential hypertension 08/28/2015   GERD (gastroesophageal reflux disease)    Glaucoma 09/14/2016   Neuromuscular disorder (HCC)    Osteoarthritis of both hands 09/14/2016   Osteoarthritis of both knees 09/14/2016   Pulmonary nodules 09/30/2012   Pure hypercholesterolemia 02/20/2021   Sarcoidosis    with +lymph node biopsy   SVT (supraventricular tachycardia) (HCC) 08/28/2015   Tachycardia    Uveitis     Family History  Adopted: Yes  Problem Relation Age of Onset   Scleroderma Daughter    Past Surgical History:  Procedure Laterality Date   CATARACT EXTRACTION, BILATERAL  2018   CYSTECTOMY     EYE SURGERY Bilateral 2018   cataract removal    LUMBAR SPINE SURGERY  2009   ROTATOR CUFF REPAIR Right 2011   ROTATOR CUFF REPAIR Left 06/2018   TONSILLECTOMY AND ADENOIDECTOMY  1962   TOTAL ABDOMINAL HYSTERECTOMY  1984   Social History   Social History Narrative   Widower.   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   Moved from [place] .   Immunization History  Administered Date(s) Administered   DTaP 07/26/2010   Influenza Split 07/19/2014   Influenza,inj,Quad PF,6+ Mos 07/19/2016, 12/21/2018   Influenza-Unspecified 07/09/2019   Pneumococcal Conjugate-13 01/21/2015   Pneumococcal Polysaccharide-23 09/19/2012, 09/21/2012   Pneumococcal-Unspecified 07/26/2012, 01/18/2015   Td 03/31/2010   Tdap 05/07/2010   Varicella 09/19/2012   Zoster, Live 07/26/2012, 09/21/2012     Objective: Vital Signs: BP 112/69 (BP Location: Left Arm, Patient Position: Sitting, Cuff Size: Normal)   Pulse 66   Resp 16   Ht 5' 3.5 (1.613 m)   Wt 129 lb (58.5 kg)   BMI 22.49 kg/m    Physical Exam Vitals and nursing note reviewed.  Constitutional:      Appearance: She is well-developed.  HENT:     Head: Normocephalic and atraumatic.   Eyes:     Conjunctiva/sclera: Conjunctivae normal.    Cardiovascular:     Rate and Rhythm: Normal rate and  regular rhythm.     Heart sounds: Normal heart sounds.  Pulmonary:     Effort: Pulmonary effort is normal.     Breath sounds: Normal breath sounds.  Abdominal:     General: Bowel sounds are normal.     Palpations: Abdomen is soft.   Musculoskeletal:     Cervical back: Normal range of motion.  Lymphadenopathy:     Cervical: No cervical adenopathy.   Skin:    General: Skin is warm and dry.     Capillary Refill: Capillary refill takes less than 2 seconds.   Neurological:     Mental Status: She is alert and oriented to person, place, and time.   Psychiatric:        Behavior: Behavior normal.      Musculoskeletal Exam: C-spine, thoracic spine, lumbar spine good range of motion.  Shoulder joints, elbow joints, wrist joints, MCPs, PIPs, DIPs have good range of motion with no synovitis.  Complete fist formation bilaterally.  PIP and DIP thickening consistent with osteoarthritis of both hands.  Hip joints have good range of motion with no groin pain.  Knee joints have good range of  motion no warmth or effusion.  Ankle joints have good range of motion with no tenderness or joint swelling.  CDAI Exam: CDAI Score: -- Patient Global: --; Provider Global: -- Swollen: --; Tender: -- Joint Exam 04/12/2024   No joint exam has been documented for this visit   There is currently no information documented on the homunculus. Go to the Rheumatology activity and complete the homunculus joint exam.  Investigation: No additional findings.  Imaging: No results found.  Recent Labs: Lab Results  Component Value Date   WBC 9.3 02/21/2024   HGB 14.9 02/21/2024   PLT 305 02/21/2024   NA 139 02/21/2024   K 4.9 02/21/2024   CL 109 02/21/2024   CO2 24 02/21/2024   GLUCOSE 118 (H) 02/21/2024   BUN 13 02/21/2024   CREATININE 0.89 02/21/2024   BILITOT 0.7 02/21/2024   ALKPHOS 57 02/21/2024   AST 31 02/21/2024   ALT 12 02/21/2024   PROT 6.6 02/21/2024   ALBUMIN 4.0 02/21/2024   CALCIUM  9.6  02/21/2024   GFRAA 79 05/20/2020   QFTBGOLD Negative 06/16/2017   QFTBGOLDPLUS Negative 06/14/2023    Speciality Comments: REMICADE  3 mg/kg x 6 weeks-ACY 06/17/20   Procedures:  No procedures performed Allergies: Codeine, Amoxicillin-pot clavulanate, Erythromycin base, Other, Sulfa antibiotics, Tramadol, Tramadol hcl, and Erythromycin    Assessment / Plan:     Visit Diagnoses: Sarcoidosis - CXR-- no cardiopulmonary process noted on 09/20/23.  No signs or symptoms of a sarcoidosis flare.  No new or worsening pulmonary symptoms.  No signs or symptoms of uveitis flare.  No recent rashes.  No signs of inflammatory arthritis.  Patient has clinically been doing well on Remicade  3 mg/kg IV infusions every 6 weeks and methotrexate  6 tablets by mouth once weekly.  She has not had any recent gaps in therapy.  No recent or recurrent infections.  A refill of methotrexate  was sent to the pharmacy today.  No medication changes will be made at this time.  She was advised to notify us  if she develops signs or symptoms of a flare.  She will follow-up in the office in 5 months or sooner if needed.  Uveitis - She has not had any signs or symptoms of uveitis flare.  No conjunctival injection noted.  She has chronic dry eyes and uses Systane eyedrops for symptomatic relief.  She continues to follow-up at [facility] on a yearly basis.  She remains on IV Remicade  infusions 3 mg/kg every 6 weeks and methotrexate  6 tablets by mouth once weekly.  She will notify us  if she develops any signs or symptoms of a flare.  High risk medications (not anticoagulants) long-term use - Remicade  IV infusion 3 mg/kg every 6 weeks, methotrexate  2.5 mg 6 tablets by mouth once weekly every 7 days, and folic acid  1 mg 2 tablets daily. CBC and CMP updated on 02/21/24--absent lymphocyte count was slightly elevated.  Plan to follow lab work closely.  She will continue to have updated lab work with infusions TB gold negative on 06/14/23. No  recent or recurrent infections.  Discussed the importance of holding remicade  and methotrexate  if she develops signs or symptoms of an infection and to resume once the infection has completely cleared.   Pulmonary nodules - Chest x-ray updated on 09/20/2023: Normal pulmonary vasculature.  No evidence of effusion, infiltrate, or pneumothorax.  No acute bony abnormality.  S/P left rotator cuff repair: She has intermittent discomfort involving both shoulders.  She has good range of  motion on examination today.  No effusion noted.  She applies Biofreeze topically as needed for pain relief.  History of repair of right rotator cuff: She experiences intermittent discomfort in both shoulders.  She applies Biofreeze topically as needed for relief.  Primary osteoarthritis of both hands: She has PIP and DIP thickening consistent with osteoarthritis of both hands.  No synovitis or dactylitis noted.  Complete fist formation bilaterally.  She experiences intermittent arthralgias and joint stiffness involving both hands but has not had any joint swelling.  She was given a list of natural anti-inflammatories which she can start taking including turmeric, tart cherry, ginger, and omega-3.  Right hand paresthesia - Under the care of Dr. Gillie and has been diagnosed with carpal tunnel syndrome bilaterally, right more severe than left.  Primary osteoarthritis of both knees: Patient experiences intermittent discomfort involving the left knee.  No warmth or effusion noted on examination today.  She uses Biofreeze and applies heat as needed for pain relief.  She has not been using a brace.  Patient was given a list of natural anti-inflammatories which she can start taking.  She has been taking Tylenol  arthritis as needed for pain relief.  Patient has been stretching in the mornings as well as walking in the evenings for exercise.  She underwent viscosupplementation for the left knee in April/May 2020 for which righted  significant relief.  She has had lasting results and does not need to reapply for viscosupplementation at this time.  She will notify us  when and if she would like for us  to reapply for viscosupplementation in the future.  Degeneration of intervertebral disc of lumbar region without discogenic back pain or lower extremity pain: No symptoms of radiculopathy.  She has been performing stretching exercises daily.  Other medical conditions are listed as follows:  SVT (supraventricular tachycardia) (HCC)  Essential hypertension: Blood pressures was 112/69.  Gastroesophageal reflux disease without esophagitis  Osteoporosis screening: DEXA updated on 05/21/2023: Right femoral neck BMD 0.750 with T-score -0.9.  Plan to update DEXA in August 2026.  Orders: No orders of the defined types were placed in this encounter.  Meds ordered this encounter  Medications   methotrexate  (RHEUMATREX) 2.5 MG tablet    Sig: Take 6 tablets (15 mg total) by mouth once a week.    Dispense:  72 tablet    Refill:  0    Follow-Up Instructions: Return in about 5 months (around 09/12/2024) for Sarcoidosis, Uveitis.   Waddell CHRISTELLA Craze, PA-C  Note - This record has been created using Dragon software.  Chart creation errors have been sought, but may not always  have been located. Such creation errors do not reflect on  the standard of medical care.

## 2024-04-03 ENCOUNTER — Ambulatory Visit (HOSPITAL_COMMUNITY)
Admission: RE | Admit: 2024-04-03 | Discharge: 2024-04-03 | Disposition: A | Discharge status: Home / Self Care | Source: Ambulatory Visit | Attending: Rheumatology | Admitting: Rheumatology

## 2024-04-03 VITALS — BP 130/68 | HR 74 | Temp 98.0°F | Resp 18 | Wt 128.1 lb

## 2024-04-03 DIAGNOSIS — Z79899 Other long term (current) drug therapy: Secondary | ICD-10-CM | POA: Diagnosis not present

## 2024-04-03 DIAGNOSIS — D869 Sarcoidosis, unspecified: Secondary | ICD-10-CM | POA: Diagnosis not present

## 2024-04-03 MED ORDER — DIPHENHYDRAMINE HCL 25 MG PO CAPS: 25.0000 mg | ORAL_CAPSULE | ORAL | Status: DC

## 2024-04-03 MED ORDER — SODIUM CHLORIDE 0.9 % IV SOLN
3.0000 mg/kg | INTRAVENOUS | Status: DC
  Filled 2024-04-03: qty 20

## 2024-04-03 MED ORDER — ACETAMINOPHEN 325 MG PO TABS: 650.0000 mg | ORAL_TABLET | ORAL | Status: DC

## 2024-04-12 ENCOUNTER — Ambulatory Visit: Payer: Medicare Other | Attending: Rheumatology | Admitting: Physician Assistant

## 2024-04-12 ENCOUNTER — Encounter: Payer: Self-pay | Admitting: Physician Assistant

## 2024-04-12 VITALS — BP 112/69 | HR 66 | Resp 16 | Ht 63.5 in | Wt 129.0 lb

## 2024-04-12 DIAGNOSIS — Z9889 Other specified postprocedural states: Secondary | ICD-10-CM | POA: Insufficient documentation

## 2024-04-12 DIAGNOSIS — M19042 Primary osteoarthritis, left hand: Secondary | ICD-10-CM | POA: Insufficient documentation

## 2024-04-12 DIAGNOSIS — D869 Sarcoidosis, unspecified: Secondary | ICD-10-CM | POA: Insufficient documentation

## 2024-04-12 DIAGNOSIS — M19041 Primary osteoarthritis, right hand: Secondary | ICD-10-CM | POA: Diagnosis not present

## 2024-04-12 DIAGNOSIS — M51369 Other intervertebral disc degeneration, lumbar region without mention of lumbar back pain or lower extremity pain: Secondary | ICD-10-CM | POA: Diagnosis not present

## 2024-04-12 DIAGNOSIS — Z79899 Other long term (current) drug therapy: Secondary | ICD-10-CM | POA: Insufficient documentation

## 2024-04-12 DIAGNOSIS — M170 Bilateral primary osteoarthritis of knee: Secondary | ICD-10-CM | POA: Insufficient documentation

## 2024-04-12 DIAGNOSIS — I471 Supraventricular tachycardia, unspecified: Secondary | ICD-10-CM | POA: Insufficient documentation

## 2024-04-12 DIAGNOSIS — K219 Gastro-esophageal reflux disease without esophagitis: Secondary | ICD-10-CM | POA: Diagnosis not present

## 2024-04-12 DIAGNOSIS — R202 Paresthesia of skin: Secondary | ICD-10-CM | POA: Diagnosis not present

## 2024-04-12 DIAGNOSIS — R918 Other nonspecific abnormal finding of lung field: Secondary | ICD-10-CM | POA: Insufficient documentation

## 2024-04-12 DIAGNOSIS — I10 Essential (primary) hypertension: Secondary | ICD-10-CM | POA: Diagnosis not present

## 2024-04-12 DIAGNOSIS — Z13820 Encounter for screening for osteoporosis: Secondary | ICD-10-CM | POA: Diagnosis not present

## 2024-04-12 DIAGNOSIS — H209 Unspecified iridocyclitis: Secondary | ICD-10-CM | POA: Diagnosis not present

## 2024-04-12 DIAGNOSIS — M1712 Unilateral primary osteoarthritis, left knee: Secondary | ICD-10-CM

## 2024-04-12 MED ORDER — METHOTREXATE SODIUM 2.5 MG PO TABS: 15.0000 mg | ORAL_TABLET | ORAL | 0 refills | Status: DC

## 2024-04-17 ENCOUNTER — Ambulatory Visit (INDEPENDENT_AMBULATORY_CARE_PROVIDER_SITE_OTHER): Payer: Medicare Other

## 2024-04-17 VITALS — BP 112/69 | Ht 63.5 in | Wt 128.0 lb

## 2024-04-17 DIAGNOSIS — Z0000 Encounter for general adult medical examination without abnormal findings: Secondary | ICD-10-CM

## 2024-04-17 NOTE — Patient Instructions (Signed)
 [name] , Thank you for taking time out of your busy schedule to complete your Annual Wellness Visit with me. I enjoyed our conversation and look forward to speaking with you again next year. I, as well as your care team,  appreciate your ongoing commitment to your health goals. Please review the following plan we discussed and let me know if I can assist you in the future. Your Game plan/ To Do List    Referrals: If you haven't heard from the office you've been referred to, please reach out to them at the phone provided.  none Follow up Visits: Next Medicare AWV with our clinical staff: 04/18/2025   Have you seen your provider in the last 6 months (3 months if uncontrolled diabetes)? Yes Next Office Visit with your provider: 08/22/2024  Clinician Recommendations:  Aim for 30 minutes of exercise or brisk walking, 6-8 glasses of water, and 5 servings of fruits and vegetables each day.       This is a list of the screening recommended for you and due dates:  Health Maintenance  Topic Date Due   Hemoglobin A1C  08/19/2024   Yearly kidney health urinalysis for diabetes  02/16/2025   Complete foot exam   02/16/2025   Eye exam for diabetics  02/16/2025   Yearly kidney function blood test for diabetes  02/20/2025   Medicare Annual Wellness Visit  04/17/2025   Mammogram  09/06/2025   Colon Cancer Screening  02/24/2027   DEXA scan (bone density measurement)  Completed   Hepatitis C Screening  Completed   Hepatitis B Vaccine  Aged Out   HPV Vaccine  Aged Out   Meningitis B Vaccine  Aged Out   DTaP/Tdap/Td vaccine  Discontinued   Pneumococcal Vaccine for age over 86  Discontinued   Flu Shot  Discontinued   COVID-19 Vaccine  Discontinued   Zoster (Shingles) Vaccine  Discontinued    Advanced directives: (Copy Requested) Please bring a copy of your health care power of attorney and living will to the office to be added to your chart at your convenience. You can mail to [facility] [address] [place], [place] [address] or email to [email or web address] Advance Care Planning is important because it:  [x]  Makes sure you receive the medical care that is consistent with your values, goals, and preferences  [x]  It provides guidance to your family and loved ones and reduces their decisional burden about whether or not they are making the right decisions based on your wishes.  Follow the link provided in your after visit summary or read over the paperwork we have mailed to you to help you started getting your Advance Directives in place. If you need assistance in completing these, please reach out to us  so that we can help you!  See attachments for Preventive Care and Fall Prevention Tips.

## 2024-04-17 NOTE — Progress Notes (Signed)
 Because this visit was a virtual/telehealth visit,  certain criteria was not obtained, such a blood pressure, CBG if applicable, and timed get up and go. Any medications not marked as taking were not mentioned during the medication reconciliation part of the visit. Any vitals not documented were not able to be obtained due to this being a telehealth visit or patient was unable to self-report a recent blood pressure reading due to a lack of equipment at home via telehealth. Vitals that have been documented are verbally provided by the patient.  This visit was performed by a medical professional under my direct supervision. I was immediately available for consultation/collaboration. I have reviewed and agree with the Annual Wellness Visit documentation.  Subjective:   Renee Jones is a 73 y.o. who presents for a Medicare Wellness preventive visit.  As a reminder, Annual Wellness Visits don't include a physical exam, and some assessments may be limited, especially if this visit is performed virtually. We may recommend an in-person follow-up visit with your provider if needed.  Visit Complete: Virtual I connected with  Renee Jones on 04/17/24 by a audio enabled telemedicine application and verified that I am speaking with the correct person using two identifiers.  Patient Location: Home  Provider Location: Home Office  I discussed the limitations of evaluation and management by telemedicine. The patient expressed understanding and agreed to proceed.  Vital Signs: Because this visit was a virtual/telehealth visit, some criteria may be missing or patient reported. Any vitals not documented were not able to be obtained and vitals that have been documented are patient reported.  VideoDeclined- This patient declined Librarian, academic. Therefore the visit was completed with audio only.  Persons Participating in Visit: Patient.  AWV Questionnaire: No:  Patient Medicare AWV questionnaire was not completed prior to this visit.  Cardiac Risk Factors include: advanced age (>40men, >52 women);dyslipidemia;hypertension     Objective:    Today's Vitals   04/17/24 0819  BP: 112/69  Weight: 128 lb (58.1 kg)  Height: 5' 3.5 (1.613 m)  PainSc: 0-No pain   Body mass index is 22.32 kg/m.     04/17/2024    8:18 AM 04/14/2023    8:27 AM 04/10/2022   10:37 AM 04/09/2021   10:39 AM 02/13/2020   12:12 PM 03/08/2015    3:09 PM 07/07/2014    5:57 PM  Advanced Directives  Does Patient Have a Medical Advance Directive? Yes Yes No Yes Yes No  No   Type of Estate agent of [place];Living will Healthcare Power of [place];Living will  Healthcare Power of [place];Living will Healthcare Power of [place];Living will    Does patient want to make changes to medical advance directive? No - Patient declined        Copy of Healthcare Power of Attorney in Chart? No - copy requested No - copy requested  No - copy requested No - copy requested    Would patient like information on creating a medical advance directive?   No - Patient declined    No - patient declined information      Data saved with a previous flowsheet row definition    Current Medications (verified) Outpatient Encounter Medications as of 04/17/2024  Medication Sig   albuterol  (VENTOLIN  HFA) 108 (90 Base) MCG/ACT inhaler Inhale 1-2 puffs into the lungs every 6 (six) hours as needed (cough).   amLODipine  (NORVASC ) 5 MG tablet Take 1 tablet (5 mg total) by mouth daily. for blood  pressure.   fluticasone (FLONASE) 50 MCG/ACT nasal spray Place 2 sprays into both nostrils daily as needed for allergies or rhinitis.   folic acid  (FOLVITE ) 1 MG tablet TAKE 2 TABLETS(2 MG) BY MOUTH DAILY   InFLIXimab  (REMICADE  IV) Inject into the vein every 6 (six) weeks.   methotrexate  (RHEUMATREX) 2.5 MG tablet Take 6 tablets (15 mg total) by mouth once a week.   metoprolol  tartrate (LOPRESSOR ) 50  MG tablet Take 1.5 tablets (75 mg total) by mouth 2 (two) times daily. Please keep upcoming visit for further refills.   ondansetron  (ZOFRAN ) 4 MG tablet Take 1 tablet (4 mg total) by mouth every 6 (six) hours as needed.   pantoprazole  (PROTONIX ) 40 MG tablet TAKE 1 TABLET BY MOUTH ONCE DAILY for heartburn   pravastatin  (PRAVACHOL ) 40 MG tablet TAKE 1 TABLET(40 MG) BY MOUTH DAILY FOR CHOLESTEROL   SYSTANE ULTRA 0.4-0.3 % SOLN SMARTSIG:1 Drop(s) In Eye(s) 6 Times Daily   VITAMIN D PO Take 1,000 Units by mouth daily.   benzonatate  (TESSALON  PERLES) 100 MG capsule 1-2 capsules up to twice daily as needed for cough. (Patient not taking: Reported on 04/17/2024)   triazolam (HALCION) 0.25 MG tablet Take 0.125 mg by mouth at bedtime as needed. (Patient not taking: Reported on 04/17/2024)   No facility-administered encounter medications on file as of 04/17/2024.    Allergies (verified) Codeine, Amoxicillin-pot clavulanate, Erythromycin base, Other, Sulfa antibiotics, Tramadol, Tramadol hcl, and Erythromycin   History: Past Medical History:  Diagnosis Date   Allergy    Asthma    Bronchial spasm 09/14/2016   DDD (degenerative disc disease), lumbar 09/14/2016   Detached retina 09/14/2016   Diastolic dysfunction 08/28/2015   Essential hypertension 08/28/2015   GERD (gastroesophageal reflux disease)    Glaucoma 09/14/2016   Neuromuscular disorder (HCC)    Osteoarthritis of both hands 09/14/2016   Osteoarthritis of both knees 09/14/2016   Pulmonary nodules 09/30/2012   Pure hypercholesterolemia 02/20/2021   Sarcoidosis    with +lymph node biopsy   SVT (supraventricular tachycardia) (HCC) 08/28/2015   Tachycardia    Uveitis    Past Surgical History:  Procedure Laterality Date   CATARACT EXTRACTION, BILATERAL  2018   CYSTECTOMY     EYE SURGERY Bilateral 2018   cataract removal    LUMBAR SPINE SURGERY  2009   ROTATOR CUFF REPAIR Right 2011   ROTATOR CUFF REPAIR Left 06/2018   TONSILLECTOMY  AND ADENOIDECTOMY  1962   TOTAL ABDOMINAL HYSTERECTOMY  1984   Family History  Adopted: Yes  Problem Relation Age of Onset   Scleroderma Daughter    Social History   Socioeconomic History   Marital status: Widowed    Spouse name: Not on file   Number of children: Not on file   Years of education: Not on file   Highest education level: Not on file  Occupational History   Not on file  Tobacco Use   Smoking status: Never    Passive exposure: Never   Smokeless tobacco: Never  Vaping Use   Vaping status: Never Used  Substance and Sexual Activity   Alcohol use: No    Alcohol/week: 0.0 standard drinks of alcohol   Drug use: No   Sexual activity: Yes  Other Topics Concern   Not on file  Social History Narrative   Widower.   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   Moved from [place] .   Social Drivers of Health   Financial Resource Strain: Low Risk  (04/17/2024)  Overall Financial Resource Strain (CARDIA)    Difficulty of Paying Living Expenses: Not hard at all  Food Insecurity: No Food Insecurity (04/17/2024)   Hunger Vital Sign    Worried About Running Out of Food in the Last Year: Never true    Ran Out of Food in the Last Year: Never true  Transportation Needs: No Transportation Needs (04/17/2024)   PRAPARE - Administrator, Civil Service (Medical): No    Lack of Transportation (Non-Medical): No  Physical Activity: Sufficiently Active (04/17/2024)   Exercise Vital Sign    Days of Exercise per Week: 3 days    Minutes of Exercise per Session: 60 min  Stress: No Stress Concern Present (04/17/2024)   [employer] of Occupational Health - Occupational Stress Questionnaire    Feeling of Stress: Not at all  Social Connections: Moderately Integrated (04/17/2024)   Social Connection and Isolation Panel    Frequency of Communication with Friends and Family: More than three times a week    Frequency of Social Gatherings with Friends and Family: More than three times  a week    Attends Religious Services: More than 4 times per year    Active Member of [employer] or Organizations: Yes    Attends Banker Meetings: More than 4 times per year    Marital Status: Widowed    Tobacco Counseling Counseling given: Not Answered    Clinical Intake:  Pre-visit preparation completed: Yes  Pain : No/denies pain Pain Score: 0-No pain     BMI - recorded: 22.32 Nutritional Status: BMI > 30  Obese Nutritional Risks: None Diabetes: No  Lab Results  Component Value Date   HGBA1C 5.9 (A) 02/17/2024   HGBA1C 6.5 (A) 11/02/2023   HGBA1C 6.3 06/02/2023     How often do you need to have someone help you when you read instructions, pamphlets, or other written materials from your doctor or pharmacy?: 1 - Never What is the last grade level you completed in school?: Masters  Interpreter Needed?: No  Information entered by :: [employer]   Activities of Daily Living     04/17/2024    8:21 AM  In your present state of health, do you have any difficulty performing the following activities:  Hearing? 0  Vision? 0  Difficulty concentrating or making decisions? 0  Walking or climbing stairs? 0  Dressing or bathing? 0  Doing errands, shopping? 0  Preparing Food and eating ? N  Using the Toilet? N  In the past six months, have you accidently leaked urine? N  Do you have problems with loss of bowel control? N  Managing your Medications? N  Managing your Finances? N  Housekeeping or managing your Housekeeping? N    Patient Care Team: Gretta Comer POUR, NP as PCP - General (Internal Medicine) [facility] Raford Riggs, MD as Attending Physician (Cardiology)  I have updated your Care Teams any recent Medical Services you may have received from other providers in the past year.     Assessment:   This is a routine wellness examination for Renee Jones.  Hearing/Vision screen Hearing Screening - Comments:: No  difficulties Vision Screening - Comments:: Patient wears glasses    Goals Addressed             This Visit's Progress    Patient Stated       Keep A1c down       Depression Screen     04/17/2024  8:23 AM 02/17/2024    8:00 AM 06/02/2023   10:43 AM 04/14/2023    8:26 AM 04/10/2022   10:35 AM 04/09/2021   10:40 AM 02/13/2020   12:13 PM  PHQ 2/9 Scores  PHQ - 2 Score 0 0 0 0 0 0 0  PHQ- 9 Score 1  1  0 0 0    Fall Risk     04/17/2024    8:21 AM 02/17/2024    8:00 AM 11/02/2023    9:25 AM 06/02/2023   10:43 AM 04/14/2023    8:28 AM  Fall Risk   Falls in the past year? 0 0 0 1 1  Number falls in past yr: 0 0 0 0 0  Injury with Fall? 0 0 0 1 1  Comment     broke left foot  Risk for fall due to : No Fall Risks No Fall Risks No Fall Risks History of fall(s) Orthopedic patient;Impaired balance/gait  Follow up Falls evaluation completed Falls evaluation completed Falls evaluation completed Falls evaluation completed Falls evaluation completed;Education provided;Falls prevention discussed    MEDICARE RISK AT HOME:  Medicare Risk at Home Any stairs in or around the home?: Yes If so, are there any without handrails?: No Home free of loose throw rugs in walkways, pet beds, electrical cords, etc?: Yes Adequate lighting in your home to reduce risk of falls?: Yes Life alert?: No Use of a cane, walker or w/c?: No Grab bars in the bathroom?: No Shower chair or bench in shower?: No Elevated toilet seat or a handicapped toilet?: No  TIMED UP AND GO:  Was the test performed?  No  Cognitive Function: 6CIT completed    04/09/2021   10:41 AM 02/13/2020   12:16 PM  MMSE - Mini Mental State Exam  Orientation to time 5 5  Orientation to Place 5 5  Registration 3 3  Attention/ Calculation 5 5  Recall 3 3  Language- repeat 1 1        04/17/2024    8:20 AM 04/14/2023    8:31 AM 04/10/2022   10:39 AM  6CIT Screen  What Year? 0 points 0 points 0 points  What month? 0 points 0 points  0 points  What time? 0 points 0 points 0 points  Count back from 20 0 points 0 points 0 points  Months in reverse 0 points 0 points 0 points  Repeat phrase 0 points 0 points 0 points  Total Score 0 points 0 points 0 points    Immunizations Immunization History  Administered Date(s) Administered   DTaP 07/26/2010   Influenza Split 07/19/2014   Influenza,inj,Quad PF,6+ Mos 07/19/2016, 12/21/2018   Influenza-Unspecified 07/09/2019   Pneumococcal Conjugate-13 01/21/2015   Pneumococcal Polysaccharide-23 09/19/2012, 09/21/2012   Pneumococcal-Unspecified 07/26/2012, 01/18/2015   Td 03/31/2010   Tdap 05/07/2010   Varicella 09/19/2012   Zoster, Live 07/26/2012, 09/21/2012    Screening Tests Health Maintenance  Topic Date Due   HEMOGLOBIN A1C  08/19/2024   Diabetic kidney evaluation - Urine ACR  02/16/2025   FOOT EXAM  02/16/2025   OPHTHALMOLOGY EXAM  02/16/2025   Diabetic kidney evaluation - eGFR measurement  02/20/2025   Medicare Annual Wellness (AWV)  04/17/2025   MAMMOGRAM  09/06/2025   Colonoscopy  02/24/2027   DEXA SCAN  Completed   Hepatitis C Screening  Completed   Hepatitis B Vaccines  Aged Out   HPV VACCINES  Aged Out   Meningococcal B Vaccine  Aged Out  DTaP/Tdap/Td  Discontinued   Pneumococcal Vaccine: 50+ Years  Discontinued   INFLUENZA VACCINE  Discontinued   COVID-19 Vaccine  Discontinued   Zoster Vaccines- Shingrix  Discontinued    Health Maintenance  There are no preventive care reminders to display for this patient. Health Maintenance Items Addressed:nothing due at the moment   Additional Screening:  Vision Screening: Recommended annual ophthalmology exams for early detection of glaucoma and other disorders of the eye. Would you like a referral to an eye doctor? No    Dental Screening: Recommended annual dental exams for proper oral hygiene  Community Resource Referral / Chronic Care Management: CRR required this visit?  No   CCM required this  visit?  No   Plan:    I have personally reviewed and noted the following in the patient's chart:   Medical and social history Use of alcohol, tobacco or illicit drugs  Current medications and supplements including opioid prescriptions. Patient is not currently taking opioid prescriptions. Functional ability and status Nutritional status Physical activity Advanced directives List of other physicians Hospitalizations, surgeries, and ER visits in previous 12 months Vitals Screenings to include cognitive, depression, and falls Referrals and appointments  In addition, I have reviewed and discussed with patient certain preventive protocols, quality metrics, and best practice recommendations. A written personalized care plan for preventive services as well as general preventive health recommendations were provided to patient.   Lyle MARLA Right, [place]   04/17/2024   After Visit Summary: (MyChart) Due to this being a telephonic visit, the after visit summary with patients personalized plan was offered to patient via MyChart   Notes: Nothing significant to report at this time.

## 2024-04-19 DIAGNOSIS — G44319 Acute post-traumatic headache, not intractable: Secondary | ICD-10-CM

## 2024-05-09 ENCOUNTER — Ambulatory Visit
Admission: RE | Admit: 2024-05-09 | Discharge: 2024-05-09 | Disposition: A | Discharge status: Home / Self Care | Source: Ambulatory Visit | Attending: Primary Care | Admitting: Primary Care

## 2024-05-09 DIAGNOSIS — I6782 Cerebral ischemia: Secondary | ICD-10-CM | POA: Diagnosis not present

## 2024-05-09 DIAGNOSIS — G44319 Acute post-traumatic headache, not intractable: Secondary | ICD-10-CM | POA: Insufficient documentation

## 2024-05-09 DIAGNOSIS — S0990XA Unspecified injury of head, initial encounter: Secondary | ICD-10-CM | POA: Diagnosis not present

## 2024-05-15 ENCOUNTER — Ambulatory Visit (HOSPITAL_COMMUNITY)
Admission: RE | Admit: 2024-05-15 | Discharge: 2024-05-15 | Disposition: A | Discharge status: Home / Self Care | Source: Ambulatory Visit | Attending: Rheumatology | Admitting: Rheumatology

## 2024-05-15 VITALS — BP 138/71 | HR 60 | Temp 97.8°F | Resp 16 | Wt 130.0 lb

## 2024-05-15 DIAGNOSIS — Z79899 Other long term (current) drug therapy: Secondary | ICD-10-CM | POA: Insufficient documentation

## 2024-05-15 DIAGNOSIS — D869 Sarcoidosis, unspecified: Secondary | ICD-10-CM | POA: Insufficient documentation

## 2024-05-15 DIAGNOSIS — H209 Unspecified iridocyclitis: Secondary | ICD-10-CM | POA: Diagnosis not present

## 2024-05-15 LAB — CBC WITH DIFFERENTIAL/PLATELET
Abs Immature Granulocytes: 0.02 K/uL (ref 0.00–0.07)
Basophils Absolute: 0 K/uL (ref 0.0–0.1)
Basophils Relative: 1 %
Eosinophils Absolute: 0.1 K/uL (ref 0.0–0.5)
Eosinophils Relative: 2 %
HCT: 40.5 % (ref 36.0–46.0)
Hemoglobin: 13.9 g/dL (ref 12.0–15.0)
Immature Granulocytes: 0 %
Lymphocytes Relative: 45 %
Lymphs Abs: 3.4 K/uL (ref 0.7–4.0)
MCH: 31.2 pg (ref 26.0–34.0)
MCHC: 34.3 g/dL (ref 30.0–36.0)
MCV: 90.8 fL (ref 80.0–100.0)
Monocytes Absolute: 0.5 K/uL (ref 0.1–1.0)
Monocytes Relative: 6 %
Neutro Abs: 3.4 K/uL (ref 1.7–7.7)
Neutrophils Relative %: 46 %
Platelets: 292 K/uL (ref 150–400)
RBC: 4.46 MIL/uL (ref 3.87–5.11)
RDW: 13.4 % (ref 11.5–15.5)
WBC: 7.5 K/uL (ref 4.0–10.5)
nRBC: 0 % (ref 0.0–0.2)

## 2024-05-15 LAB — COMPREHENSIVE METABOLIC PANEL WITH GFR
ALT: 15 U/L (ref 0–44)
AST: 20 U/L (ref 15–41)
Albumin: 4.2 g/dL (ref 3.5–5.0)
Alkaline Phosphatase: 65 U/L (ref 38–126)
Anion gap: 9 (ref 5–15)
BUN: 11 mg/dL (ref 8–23)
CO2: 23 mmol/L (ref 22–32)
Calcium: 9.2 mg/dL (ref 8.9–10.3)
Chloride: 108 mmol/L (ref 98–111)
Creatinine, Ser: 0.87 mg/dL (ref 0.44–1.00)
GFR, Estimated: >60 mL/min (ref >60)
Glucose, Bld: 120 mg/dL — ABNORMAL HIGH (ref 70–99)
Potassium: 4 mmol/L (ref 3.5–5.1)
Sodium: 140 mmol/L (ref 135–145)
Total Bilirubin: 0.9 mg/dL (ref 0.0–1.2)
Total Protein: 6.9 g/dL (ref 6.5–8.1)

## 2024-05-15 MED ORDER — SODIUM CHLORIDE 0.9 % IV SOLN
3.0000 mg/kg | INTRAVENOUS | Status: DC
  Administered 2024-05-15: 200 mg via INTRAVENOUS
  Filled 2024-05-15: qty 20

## 2024-05-15 MED ORDER — DIPHENHYDRAMINE HCL 25 MG PO CAPS: 25.0000 mg | ORAL_CAPSULE | ORAL | Status: DC

## 2024-05-15 MED ORDER — ACETAMINOPHEN 325 MG PO TABS: 650.0000 mg | ORAL_TABLET | ORAL | Status: DC

## 2024-05-15 MED ORDER — ACETAMINOPHEN 325 MG PO TABS
ORAL_TABLET | ORAL | Status: AC
  Filled 2024-05-15: qty 2

## 2024-05-15 MED ORDER — DIPHENHYDRAMINE HCL 25 MG PO CAPS
ORAL_CAPSULE | ORAL | Status: AC
  Filled 2024-05-15: qty 1

## 2024-05-16 ENCOUNTER — Ambulatory Visit: Payer: Self-pay | Admitting: Rheumatology

## 2024-05-16 NOTE — Progress Notes (Signed)
 CBC and CMP are normal except glucose is mildly elevated probably not a fasting sample.  TB Gold is pending.

## 2024-05-17 ENCOUNTER — Ambulatory Visit: Payer: Self-pay | Admitting: Primary Care

## 2024-05-17 LAB — QUANTIFERON-TB GOLD PLUS (RQFGPL)
QuantiFERON Mitogen Value: >10 [IU]/mL
QuantiFERON Nil Value: 0.12 [IU]/mL
QuantiFERON TB1 Ag Value: 0.12 [IU]/mL
QuantiFERON TB2 Ag Value: 0.09 [IU]/mL

## 2024-05-17 LAB — QUANTIFERON-TB GOLD PLUS: QuantiFERON-TB Gold Plus: NEGATIVE

## 2024-05-17 NOTE — Progress Notes (Signed)
 TB Gold is negative, CBC and CMP are normal except glucose is mildly elevated, probably not a fasting sample.

## 2024-05-18 ENCOUNTER — Other Ambulatory Visit: Payer: Self-pay | Admitting: Physician Assistant

## 2024-05-18 MED ORDER — FOLIC ACID 1 MG PO TABS: 2.0000 mg | ORAL_TABLET | Freq: Every day | ORAL | 3 refills | Status: AC

## 2024-05-18 NOTE — Telephone Encounter (Signed)
 Last Fill: 04/12/2023  Next Visit: 09/28/2024  Last Visit: 04/12/2024  Dx: Renee Jones   Current Dose per office note on 10/27/2023: folic acid  1 mg 2 tablets daily.   Okay to refill Folic Acid ?

## 2024-06-20 ENCOUNTER — Other Ambulatory Visit: Payer: Self-pay | Admitting: Primary Care

## 2024-06-20 ENCOUNTER — Other Ambulatory Visit: Payer: Self-pay | Admitting: Cardiovascular Disease

## 2024-06-20 DIAGNOSIS — E785 Hyperlipidemia, unspecified: Secondary | ICD-10-CM

## 2024-06-22 ENCOUNTER — Other Ambulatory Visit: Payer: Self-pay | Admitting: Pharmacist

## 2024-06-22 DIAGNOSIS — H209 Unspecified iridocyclitis: Secondary | ICD-10-CM

## 2024-06-22 DIAGNOSIS — Z79899 Other long term (current) drug therapy: Secondary | ICD-10-CM

## 2024-06-22 DIAGNOSIS — D869 Sarcoidosis, unspecified: Secondary | ICD-10-CM

## 2024-06-22 NOTE — Progress Notes (Signed)
 Next infusion for {rxrheuminfusion:26200} {rxpulminfusion:25644} scheduled on *** and due for updated orders. Diagnosis:  Dose: *** every ***  Last Clinic Visit: *** Next Clinic Visit: ***  Last infusion: ***  Labs: *** TB Gold: ***   Orders placed for {rxrheuminfusion:26200} x *** doses along with premedication of {infusionpremedications:32956} to be administered 30 minutes before medication infusion.  Standing CBC with diff/platelet and CMP with GFR orders placed to be drawn every *** months.  Next TB gold due ***  Sherry Pennant, PharmD, MPH, BCPS, CPP Clinical Pharmacist (Rheumatology and Pulmonology)

## 2024-06-23 ENCOUNTER — Telehealth: Payer: Self-pay | Admitting: Pharmacy Technician

## 2024-06-23 NOTE — Telephone Encounter (Signed)
 Auth Submission: NO AUTH NEEDED Site of care: Site of care: [facility] Payer: MEDICARE A/B & SUPP Medication & CPT/J Code(s) submitted: Remicade  (Infliximab ) J1745 Diagnosis Code: D86.9 Route of submission (phone, fax, portal):  Phone # Fax # Auth type: Buy/Bill HB Units/visits requested: 200MG  [id number] Reference number:  Approval from: 03/31/24 to 11/18/24

## 2024-06-26 ENCOUNTER — Ambulatory Visit (HOSPITAL_COMMUNITY)
Admission: RE | Admit: 2024-06-26 | Discharge: 2024-06-26 | Disposition: A | Discharge status: Home / Self Care | Source: Ambulatory Visit | Attending: Rheumatology | Admitting: Rheumatology

## 2024-06-26 ENCOUNTER — Ambulatory Visit: Payer: Self-pay | Admitting: Physician Assistant

## 2024-06-26 DIAGNOSIS — H209 Unspecified iridocyclitis: Secondary | ICD-10-CM | POA: Insufficient documentation

## 2024-06-26 DIAGNOSIS — D869 Sarcoidosis, unspecified: Secondary | ICD-10-CM | POA: Insufficient documentation

## 2024-06-26 DIAGNOSIS — Z79899 Other long term (current) drug therapy: Secondary | ICD-10-CM | POA: Diagnosis not present

## 2024-06-26 LAB — CBC WITH DIFFERENTIAL/PLATELET
Abs Immature Granulocytes: 0.01 K/uL (ref 0.00–0.07)
Basophils Absolute: 0 K/uL (ref 0.0–0.1)
Basophils Relative: 0 %
Eosinophils Absolute: 0.1 K/uL (ref 0.0–0.5)
Eosinophils Relative: 2 %
HCT: 41.1 % (ref 36.0–46.0)
Hemoglobin: 14 g/dL (ref 12.0–15.0)
Immature Granulocytes: 0 %
Lymphocytes Relative: 47 %
Lymphs Abs: 3.8 K/uL (ref 0.7–4.0)
MCH: 31.1 pg (ref 26.0–34.0)
MCHC: 34.1 g/dL (ref 30.0–36.0)
MCV: 91.3 fL (ref 80.0–100.0)
Monocytes Absolute: 0.4 K/uL (ref 0.1–1.0)
Monocytes Relative: 5 %
Neutro Abs: 3.7 K/uL (ref 1.7–7.7)
Neutrophils Relative %: 46 %
Platelets: 277 K/uL (ref 150–400)
RBC: 4.5 MIL/uL (ref 3.87–5.11)
RDW: 13.2 % (ref 11.5–15.5)
WBC: 8.1 K/uL (ref 4.0–10.5)
nRBC: 0 % (ref 0.0–0.2)

## 2024-06-26 LAB — COMPREHENSIVE METABOLIC PANEL WITH GFR
ALT: 14 U/L (ref 0–44)
AST: 25 U/L (ref 15–41)
Albumin: 4.1 g/dL (ref 3.5–5.0)
Alkaline Phosphatase: 64 U/L (ref 38–126)
Anion gap: 10 (ref 5–15)
BUN: 12 mg/dL (ref 8–23)
CO2: 24 mmol/L (ref 22–32)
Calcium: 9.2 mg/dL (ref 8.9–10.3)
Chloride: 107 mmol/L (ref 98–111)
Creatinine, Ser: 0.83 mg/dL (ref 0.44–1.00)
GFR, Estimated: >60 mL/min (ref >60)
Glucose, Bld: 134 mg/dL — ABNORMAL HIGH (ref 70–99)
Potassium: 3.9 mmol/L (ref 3.5–5.1)
Sodium: 141 mmol/L (ref 135–145)
Total Bilirubin: 1 mg/dL (ref 0.0–1.2)
Total Protein: 6.7 g/dL (ref 6.5–8.1)

## 2024-06-26 MED ORDER — SODIUM CHLORIDE 0.9 % IV SOLN
3.0000 mg/kg | INTRAVENOUS | Status: DC
  Administered 2024-06-26: 200 mg via INTRAVENOUS
  Filled 2024-06-26: qty 20

## 2024-06-26 MED ORDER — DIPHENHYDRAMINE HCL 25 MG PO CAPS: 25.0000 mg | ORAL_CAPSULE | ORAL | Status: DC

## 2024-06-26 MED ORDER — ACETAMINOPHEN 325 MG PO TABS: 650.0000 mg | ORAL_TABLET | ORAL | Status: DC

## 2024-06-26 NOTE — Progress Notes (Signed)
 CBC WNL

## 2024-06-26 NOTE — Progress Notes (Signed)
 Glucose is 134. Rest of CMP WNL

## 2024-07-13 ENCOUNTER — Other Ambulatory Visit: Payer: Self-pay | Admitting: Physician Assistant

## 2024-07-14 NOTE — Telephone Encounter (Signed)
 Last Fill: 04/12/2024  Labs: 06/26/2024 Glucose is 134. Rest of CMP WNL   Next Visit: 09/28/2024  Last Visit: 04/12/2024  DX: Sarcoidosis   Current Dose per office note on 04/12/2024: methotrexate  2.5 mg 6 tablets by mouth once weekly every 7 days   Okay to refill Methotrexate ?

## 2024-08-04 ENCOUNTER — Encounter (HOSPITAL_BASED_OUTPATIENT_CLINIC_OR_DEPARTMENT_OTHER): Payer: Self-pay | Admitting: Family

## 2024-08-04 ENCOUNTER — Ambulatory Visit (INDEPENDENT_AMBULATORY_CARE_PROVIDER_SITE_OTHER): Admitting: Family

## 2024-08-04 VITALS — BP 112/50 | HR 79 | Ht 63.0 in | Wt 130.0 lb

## 2024-08-04 DIAGNOSIS — E782 Mixed hyperlipidemia: Secondary | ICD-10-CM

## 2024-08-04 DIAGNOSIS — R42 Dizziness and giddiness: Secondary | ICD-10-CM

## 2024-08-04 DIAGNOSIS — I10 Essential (primary) hypertension: Secondary | ICD-10-CM | POA: Diagnosis not present

## 2024-08-04 DIAGNOSIS — I340 Nonrheumatic mitral (valve) insufficiency: Secondary | ICD-10-CM

## 2024-08-04 DIAGNOSIS — I471 Supraventricular tachycardia, unspecified: Secondary | ICD-10-CM | POA: Diagnosis not present

## 2024-08-04 MED ORDER — AMLODIPINE BESYLATE 2.5 MG PO TABS: 2.5000 mg | ORAL_TABLET | Freq: Every day | ORAL | 1 refills | Status: AC

## 2024-08-04 NOTE — Progress Notes (Signed)
 Cardiology Office Note   Date:  08/04/2024  ID:  Renee Jones, DOB 1951/01/22, MRN [id number] PCP: Gretta Comer POUR, NP  [facility] Providers Cardiologist:  None     History of Present Illness  Discussed the use of AI scribe software for clinical note transcription with the patient, who gave verbal consent to proceed.  History of Present Illness Renee Jones is a 73 year old female with hypertension, paroxysmal SVT, sarcoidosis, GERD, hyperlipidemia mitral valve regurgitation who presents with dizziness and fatigue.  Cardiac MRI 10/2013 with no evidence of delayed enhancement concerning for cardiac involvement of sarcoidosis.  Prior CPX 6 active diastolic dysfunction.  Established with cardiology 05/2015 for palpitations.  Holter monitor with SVT.  Previous diagnosis of myocarditis as a child.  At visit 2/25 she noted vibration sense in her chest with subsequent ZIO with rare PVC/PAC, no significant pauses.  CBC, BMP, thyroid  were unremarkable.  She was last seen 02/03/2024 with updated echo 02/24/2024 normal LVEF 60 to 65%, grade 1 diastolic dysfunction, mild MR.  Presents to for follow-up independently.She experiences dizziness characterized by spinning and lightheadedness, occurring sporadically, sometimes while standing and talking. She recalls stumbling in her supervisor's doorway. Fatigue accompanies these episodes. Palpitations are described as 'flutters' and 'something running through and hitting something it wasn't supposed to.'  These have been overall quiescent on her present dose of metoprolol .  Home blood pressure readings range from 116/52 to 162/90 mmHg, with a target of below 130/80 mmHg. Blood pressure is sporadic, and during infusions, it has been noted to be low.  She maintains a regular diet and stays well-hydrated, primarily drinking water and unsweetened tea. No recent swelling in ankles or changes in fluid retention.  ROS: Please see the history  of present illness.    All other systems reviewed and are negative.   Studies Reviewed      Cardiac Studies & Procedures   ______________________________________________________________________________________________     ECHOCARDIOGRAM  ECHOCARDIOGRAM COMPLETE 02/24/2024  Narrative ECHOCARDIOGRAM REPORT    Patient Name:   Renee Jones Date of Exam: 02/24/2024 Medical Rec #:  [id number]             Height:       63.5 in Accession #:    [id number]            Weight:       127.0 lb Date of Birth:  1950-11-23            BSA:          1.604 m Patient Age:    72 years              BP:           142/80 mmHg Patient Gender: F                     HR:           69 bpm. Exam Location:  Outpatient  Procedure: 2D Echo, Color Doppler and Cardiac Doppler (Both Spectral and Color Flow Doppler were utilized during procedure).  Indications:    Mitral Regurgitation  History:        Patient has prior history of Echocardiogram examinations. Arrythmias:Tachycardia; Risk Factors:Hypertension, Non-Smoker, Dyslipidemia and Diabetes. Sarcoidosis; SVT.  Sonographer:    Orvil Holmes RDCS Referring Phys: [phone] ROSALINE HERO SWINYER  IMPRESSIONS   1. Left ventricular ejection fraction, by estimation, is 60 to 65%. Left ventricular ejection fraction by 3D volume is  69 %. The left ventricle has normal function. The left ventricle has no regional wall motion abnormalities. Left ventricular diastolic parameters are consistent with Grade I diastolic dysfunction (impaired relaxation). The average left ventricular global longitudinal strain is -18.1 %. The global longitudinal strain is normal. 2. Right ventricular systolic function is normal. The right ventricular size is normal. 3. The mitral valve is normal in structure. Mild mitral valve regurgitation. No evidence of mitral stenosis. 4. The aortic valve is normal in structure. Aortic valve regurgitation is not visualized. No aortic stenosis is  present. 5. The inferior vena cava is normal in size with greater than 50% respiratory variability, suggesting right atrial pressure of 3 mmHg.  FINDINGS Left Ventricle: Left ventricular ejection fraction, by estimation, is 60 to 65%. Left ventricular ejection fraction by 3D volume is 69 %. The left ventricle has normal function. The left ventricle has no regional wall motion abnormalities. The average left ventricular global longitudinal strain is -18.1 %. Strain was performed and the global longitudinal strain is normal. The left ventricular internal cavity size was normal in size. There is no left ventricular hypertrophy. Left ventricular diastolic parameters are consistent with Grade I diastolic dysfunction (impaired relaxation).  Right Ventricle: The right ventricular size is normal. No increase in right ventricular wall thickness. Right ventricular systolic function is normal.  Left Atrium: Left atrial size was normal in size.  Right Atrium: Right atrial size was normal in size.  Pericardium: There is no evidence of pericardial effusion.  Mitral Valve: The mitral valve is normal in structure. Mild mitral valve regurgitation. No evidence of mitral valve stenosis.  Tricuspid Valve: The tricuspid valve is normal in structure. Tricuspid valve regurgitation is not demonstrated. No evidence of tricuspid stenosis.  Aortic Valve: The aortic valve is normal in structure. Aortic valve regurgitation is not visualized. No aortic stenosis is present.  Pulmonic Valve: The pulmonic valve was normal in structure. Pulmonic valve regurgitation is not visualized. No evidence of pulmonic stenosis.  Aorta: The aortic root is normal in size and structure.  Venous: The inferior vena cava is normal in size with greater than 50% respiratory variability, suggesting right atrial pressure of 3 mmHg.  IAS/Shunts: No atrial level shunt detected by color flow Doppler.  Additional Comments: 3D was performed not  requiring image post processing on an independent workstation and was normal.   LEFT VENTRICLE PLAX 2D LVIDd:         2.84 cm         Diastology LVIDs:         1.56 cm         LV e' medial:    6.09 cm/s LV PW:         1.00 cm         LV E/e' medial:  8.0 LV IVS:        0.78 cm         LV e' lateral:   9.36 cm/s LVOT diam:     2.00 cm         LV E/e' lateral: 5.2 LV SV:         51 LV SV Index:   32              2D Longitudinal LVOT Area:     3.14 cm        Strain 2D Strain GLS   -15.7 % (A4C): 2D Strain GLS   -19.0 % (A3C): 2D Strain GLS   -19.5 % (A2C):  2D Strain GLS   -18.1 % Avg:  3D Volume EF LV 3D EF:    Left ventricul ar ejection fraction by 3D volume is 69 %.  3D Volume EF: 3D EF:        69 % LV EDV:       72 ml LV ESV:       22 ml LV SV:        50 ml  RIGHT VENTRICLE RV Basal diam:  2.48 cm RV Mid diam:    2.28 cm RV S prime:     9.36 cm/s TAPSE (M-mode): 2.0 cm  LEFT ATRIUM             Index        RIGHT ATRIUM          Index LA diam:        2.70 cm 1.68 cm/m   RA Area:     8.45 cm LA Vol (A2C):   26.5 ml 16.53 ml/m  RA Volume:   16.20 ml 10.10 ml/m LA Vol (A4C):   30.0 ml 18.71 ml/m LA Biplane Vol: 28.3 ml 17.65 ml/m AORTIC VALVE LVOT Vmax:   79.00 cm/s LVOT Vmean:  52.000 cm/s LVOT VTI:    0.162 m  AORTA Ao Root diam: 2.40 cm Ao Asc diam:  2.60 cm  MITRAL VALVE               TRICUSPID VALVE MV Area (PHT): 3.99 cm    TR Peak grad:   21.9 mmHg MV Decel Time: 190 msec    TR Vmax:        234.00 cm/s MV E velocity: 48.90 cm/s MV A velocity: 58.80 cm/s  SHUNTS MV E/A ratio:  0.83        Systemic VTI:  0.16 m Systemic Diam: 2.00 cm  Kardie Tobb DO Electronically signed by Dub Huntsman DO Signature Date/Time: 02/24/2024/2:30:26 PM    Final    MONITORS  LONG TERM MONITOR (3-14 DAYS) 12/30/2023  Narrative 10 Day Zio Monitor  Quality: Fair.  Baseline artifact. Predominant rhythm: Sinus rhythm. Average heart rate: 68 bpm Max heart  rate: 132 bpm Min heart rate: 45 bpm Pauses >2.5 seconds: none  Rare (<1%) PACs and PVCs.  Tiffany C. Raford, MD, [facility] 01/24/2024 6:16 PM     CARDIAC MRI  MR CARDIAC MORPHOLOGY W WO CONTRAST 11/03/2012  Narrative *RADIOLOGY REPORT*  Clinical Data: Evaluate for cardiac sarcoidosis  MR CARDIA MORPHOLOGY WITHOUT AND WITH CONTRAST  GE 1.5 T magnet with dedicated cardiac coil.  FIESTA sequences for function and morphology.  10 minutes after 20 mL Multihance  contrast was injected, inversion recovery sequences were done to assess for myocardial delayed enhancement.  Contrast: 20mL MULTIHANCE  GADOBENATE DIMEGLUMINE  529 MG/ML IV SOLN  Comparison: None.  Findings: Normal left ventricular size and systolic function, EF 69%.  Normal wall thickness and normal wall motion.  Normal right ventricular size and systolic function.  Normal right and left atrial sizes.  Trileaflet aortic valve with no stenosis and no significant regurgitation.  Though flow sequences were not done, there was no evidence for significant mitral regurgitation.  On delaye enhancement images, there was no myocardial delayed enhancement.  Measurements:  LVEDV 73 mL  LV SV 50 mL  LV EF 69%  IMPRESSION: 1. Normal LV size and systolic function, EF 69%.  2. Normal RV size and systolic function.  3. No myocardial delayed enhancement, so no definitive evidence for prior MI, infiltrative disease, or  myocarditis.  4. No evidence on this exam for cardiac sarcoidosis.   Original Report Authenticated By: Ezra Shuck   ______________________________________________________________________________________________      Risk Assessment/Calculations           Physical Exam VS:  BP (!) 112/50 (BP Location: Right Arm, Patient Position: Sitting, Cuff Size: Normal)   Pulse 79   Ht 5' 3 (1.6 m)   Wt 130 lb (59 kg)   SpO2 98%   BMI 23.03 kg/m        Wt Readings from Last 3 Encounters:  08/04/24 130 lb  (59 kg)  06/26/24 129 lb (58.5 kg)  05/15/24 130 lb (59 kg)    GEN: Well nourished, well developed in no acute distress NECK: No JVD; No carotid bruits CARDIAC: RRR, no murmurs, rubs, gallops RESPIRATORY:  Clear to auscultation without rales, wheezing or rhonchi  ABDOMEN: Soft, non-tender, non-distended EXTREMITIES:  No edema; No deformity   ASSESSMENT AND PLAN Assessment & Plan Hypertension with associated dizziness and lightheadedness Hypertension with dizziness and lightheadedness likely due to low blood pressure. Home readings vary, with recent office visits showing lower pressures - Reduce amlodipine  to 2.5 mg daily to address low blood pressure and dizziness. - Discussed to monitor BP at home at least 2 hours after medications and sitting for 5-10 minutes.   Mitral valve regurgitation, mild Mild mitral valve regurgitation on echocardiogram 02/2024.  - Recheck echocardiogram in 3 to 5 years unless symptoms change. - Maintain good blood pressure control.  Palpitations Intermittent palpitations described as flutters. No acute changes or significant concerns. -continue lopressor  75mg  BID.   Hyperlipidemia Hyperlipidemia managed with pravastatin  40mg  daily per PCP          Dispo: follow up in 6 months with Dr. Raford or APP  Signed, Reche GORMAN Finder, NP

## 2024-08-04 NOTE — Patient Instructions (Signed)
 Medication Instructions:  CHANGE Amlodipine  to 2.5mg  once per day  *If you need a refill on your cardiac medications before your next appointment, please call your pharmacy*  Follow-Up: At [facility], you and your health needs are our priority.  As part of our continuing mission to provide you with exceptional heart care, our providers are all part of one team.  This team includes your primary Cardiologist (physician) and Advanced Practice Providers or APPs (Physician Assistants and Nurse Practitioners) who all work together to provide you with the care you need, when you need it.  Your next appointment:   6 month(s)  Provider:   Annabella Scarce, MD, Rosaline Bane, NP, or Reche Finder, NP    We recommend signing up for the patient portal called MyChart.  Sign up information is provided on this After Visit Summary.  MyChart is used to connect with patients for Virtual Visits (Telemedicine).  Patients are able to view lab/test results, encounter notes, upcoming appointments, etc.  Non-urgent messages can be sent to your provider as well.   To learn more about what you can do with MyChart, go to [email or web address].

## 2024-08-07 ENCOUNTER — Ambulatory Visit (HOSPITAL_COMMUNITY)
Admission: RE | Admit: 2024-08-07 | Discharge: 2024-08-07 | Disposition: A | Discharge status: Home / Self Care | Source: Ambulatory Visit | Attending: Rheumatology | Admitting: Rheumatology

## 2024-08-07 VITALS — BP 134/69 | HR 64 | Temp 97.7°F | Resp 16

## 2024-08-07 DIAGNOSIS — D869 Sarcoidosis, unspecified: Secondary | ICD-10-CM | POA: Diagnosis not present

## 2024-08-07 DIAGNOSIS — H209 Unspecified iridocyclitis: Secondary | ICD-10-CM | POA: Diagnosis not present

## 2024-08-07 MED ORDER — SODIUM CHLORIDE 0.9 % IV SOLN
3.0000 mg/kg | Freq: Once | INTRAVENOUS | Status: AC
  Administered 2024-08-07: 200 mg via INTRAVENOUS
  Filled 2024-08-07: qty 20

## 2024-08-07 MED ORDER — ACETAMINOPHEN 325 MG PO TABS: 650.0000 mg | ORAL_TABLET | Freq: Once | ORAL | Status: DC

## 2024-08-07 MED ORDER — DIPHENHYDRAMINE HCL 25 MG PO CAPS: 25.0000 mg | ORAL_CAPSULE | Freq: Once | ORAL | Status: DC

## 2024-08-17 DIAGNOSIS — M25511 Pain in right shoulder: Secondary | ICD-10-CM | POA: Diagnosis not present

## 2024-08-17 DIAGNOSIS — M19011 Primary osteoarthritis, right shoulder: Secondary | ICD-10-CM | POA: Diagnosis not present

## 2024-08-22 ENCOUNTER — Ambulatory Visit: Payer: Self-pay | Admitting: Primary Care

## 2024-08-22 ENCOUNTER — Ambulatory Visit: Admitting: Primary Care

## 2024-08-22 ENCOUNTER — Encounter: Payer: Self-pay | Admitting: Primary Care

## 2024-08-22 VITALS — BP 132/72 | HR 56 | Temp 97.8°F | Ht 63.5 in | Wt 129.4 lb

## 2024-08-22 DIAGNOSIS — E1165 Type 2 diabetes mellitus with hyperglycemia: Secondary | ICD-10-CM | POA: Diagnosis not present

## 2024-08-22 DIAGNOSIS — I10 Essential (primary) hypertension: Secondary | ICD-10-CM | POA: Diagnosis not present

## 2024-08-22 DIAGNOSIS — D8686 Sarcoid arthropathy: Secondary | ICD-10-CM

## 2024-08-22 DIAGNOSIS — H209 Unspecified iridocyclitis: Secondary | ICD-10-CM

## 2024-08-22 DIAGNOSIS — K219 Gastro-esophageal reflux disease without esophagitis: Secondary | ICD-10-CM

## 2024-08-22 DIAGNOSIS — R000 Tachycardia, unspecified: Secondary | ICD-10-CM | POA: Diagnosis not present

## 2024-08-22 DIAGNOSIS — E7800 Pure hypercholesterolemia, unspecified: Secondary | ICD-10-CM

## 2024-08-22 DIAGNOSIS — I471 Supraventricular tachycardia, unspecified: Secondary | ICD-10-CM

## 2024-08-22 DIAGNOSIS — R002 Palpitations: Secondary | ICD-10-CM

## 2024-08-22 LAB — POCT GLYCOSYLATED HEMOGLOBIN (HGB A1C): Hemoglobin A1C: 5.8 % — AB (ref 4.0–5.6)

## 2024-08-22 LAB — LIPID PANEL
Cholesterol: 170 mg/dL (ref 0–200)
HDL: 59.3 mg/dL (ref >39.00)
LDL Cholesterol: 90 mg/dL (ref 0–99)
NonHDL: 110.33
Total CHOL/HDL Ratio: 3
Triglycerides: 103 mg/dL (ref 0.0–149.0)
VLDL: 20.6 mg/dL (ref 0.0–40.0)

## 2024-08-22 NOTE — Assessment & Plan Note (Signed)
 Controlled.  Continue pantoprazole 40 mg daily.

## 2024-08-22 NOTE — Assessment & Plan Note (Signed)
 Repeat lipid panel pending.  Continue pravastatin  40 mg daily.

## 2024-08-22 NOTE — Assessment & Plan Note (Signed)
 Stable.  Continue methotrexate  15 mg weekly, Remicade  infusions every 6 weeks.

## 2024-08-22 NOTE — Assessment & Plan Note (Signed)
 Controlled.  Continue metoprolol  75 mg BID. Following with cardiology, office notes reviewed from October 2025

## 2024-08-22 NOTE — Assessment & Plan Note (Signed)
 Well-controlled with A1c of 5.8 today!. Commended her on lifestyle changes. Remain off treatment.  Follow-up in 6 months.

## 2024-08-22 NOTE — Patient Instructions (Signed)
 Stop by the lab prior to leaving today. I will notify you of your results once received.   Please schedule a follow up visit for 6 months for a diabetes check.  It was a pleasure to see you today!

## 2024-08-22 NOTE — Assessment & Plan Note (Signed)
 Controlled, no concerns today.  Continue metoprolol  tartrate 75 mg twice daily

## 2024-08-22 NOTE — Assessment & Plan Note (Signed)
 Controlled.  Continue metoprolol  to tartrate 75 mg twice daily.

## 2024-08-22 NOTE — Progress Notes (Signed)
 Subjective:    Patient ID: Renee Jones, female    DOB: [birth year], 73 y.o.   MRN: [id number]  Renee Jones is a very pleasant 73 y.o. female with a history of hypertension, type 2 diabetes, GERD, osteoarthritis who presents today for follow-up of chronic conditions   Immunizations: -Tetanus: Completed in 2011 -Influenza: Declines influenza vaccine.  -Shingles: Completed Zostavax -Pneumonia: Completed 2016  Mammogram: Completed in November 2024 Bone Density Scan: Completed in August 2024  Colonoscopy: Completed in around 2021 per [facility].   1) Type 2 Diabetes:  Current medications include: None  Last A1C: 5.9 in May 2025, 5.8 today Last Eye Exam: UTD Last Foot Exam: UTD Pneumonia Vaccination: 2016 Urine Microalbumin: UTD Statin: pravastatin     2) Hypertension/Hyperlipidemia/Palpitations: Currently managed on amlodipine  2.5 mg daily, metoprolol  tartrate 75 mg twice daily.  Following with cardiology, last office visit was 08/04/2024.  Her amlodipine  was reduced to 2.5 mg daily due to lower blood pressure readings and dizziness.  She denies chest pain, shortness of breath.   Her dizziness has yet to resolve but she just reduced her amlodipine  a few days ago.  BP Readings from Last 3 Encounters:  08/22/24 132/72  08/07/24 (P) 136/66  08/04/24 (!) 112/50   3) Sarcoidosis: Following with rheumatology, currently managed on methotrexate  6 tablets once weekly, Remicade  3 mg/kg IV infusions every 6 weeks.  Last office visit was in June 2025.  Last chest x-ray was in December 2024.   She is also managed on albuterol  inhaler PRN, tessalon  pearls 100 mg PRN.  Overall feels well managed. Denies chest pain and shortness of breath. She has no cough.    Review of Systems  Respiratory:  Negative for shortness of breath.   Cardiovascular:  Negative for chest pain.  Gastrointestinal:  Negative for constipation and diarrhea.  Musculoskeletal:  Positive for  arthralgias and back pain.  Psychiatric/Behavioral:  The patient is not nervous/anxious.          Past Medical History:  Diagnosis Date   Allergy    Asthma    Bronchial spasm 09/14/2016   DDD (degenerative disc disease), lumbar 09/14/2016   Detached retina 09/14/2016   Diastolic dysfunction 08/28/2015   Essential hypertension 08/28/2015   GERD (gastroesophageal reflux disease)    Glaucoma 09/14/2016   Neuromuscular disorder (HCC)    Osteoarthritis of both hands 09/14/2016   Osteoarthritis of both knees 09/14/2016   Persistent cough for 3 weeks or longer 03/09/2023   Post-traumatic headache 03/22/2024   Pulmonary nodules 09/30/2012   Pure hypercholesterolemia 02/20/2021   Sarcoidosis    with +lymph node biopsy   SVT (supraventricular tachycardia) 08/28/2015   Tachycardia    Uveitis     Social History   Socioeconomic History   Marital status: Widowed    Spouse name: Not on file   Number of children: Not on file   Years of education: Not on file   Highest education level: Not on file  Occupational History   Not on file  Tobacco Use   Smoking status: Never    Passive exposure: Never   Smokeless tobacco: Never  Vaping Use   Vaping status: Never Used  Substance and Sexual Activity   Alcohol use: No    Alcohol/week: 0.0 standard drinks of alcohol   Drug use: No   Sexual activity: Yes  Other Topics Concern   Not on file  Social History Narrative   Widower.   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  Moved from [place] .   Social Drivers of Corporate Investment Banker Strain: Low Risk  (04/17/2024)   Overall Financial Resource Strain (CARDIA)    Difficulty of Paying Living Expenses: Not hard at all  Food Insecurity: No Food Insecurity (04/17/2024)   Hunger Vital Sign    Worried About Running Out of Food in the Last Year: Never true    Ran Out of Food in the Last Year: Never true  Transportation Needs: No Transportation Needs (04/17/2024)   PRAPARE -  Administrator, Civil Service (Medical): No    Lack of Transportation (Non-Medical): No  Physical Activity: Sufficiently Active (04/17/2024)   Exercise Vital Sign    Days of Exercise per Week: 3 days    Minutes of Exercise per Session: 60 min  Stress: No Stress Concern Present (04/17/2024)   [employer] of Occupational Health - Occupational Stress Questionnaire    Feeling of Stress: Not at all  Social Connections: Moderately Integrated (04/17/2024)   Social Connection and Isolation Panel    Frequency of Communication with Friends and Family: More than three times a week    Frequency of Social Gatherings with Friends and Family: More than three times a week    Attends Religious Services: More than 4 times per year    Active Member of [employer] or Organizations: Yes    Attends Banker Meetings: More than 4 times per year    Marital Status: Widowed  Intimate Partner Violence: Not At Risk (04/17/2024)   Humiliation, Afraid, Rape, and Kick questionnaire    Fear of Current or Ex-Partner: No    Emotionally Abused: No    Physically Abused: No    Sexually Abused: No    Past Surgical History:  Procedure Laterality Date   CATARACT EXTRACTION, BILATERAL  2018   CYSTECTOMY     EYE SURGERY Bilateral 2018   cataract removal    LUMBAR SPINE SURGERY  2009   ROTATOR CUFF REPAIR Right 2011   ROTATOR CUFF REPAIR Left 06/2018   TONSILLECTOMY AND ADENOIDECTOMY  1962   TOTAL ABDOMINAL HYSTERECTOMY  1984    Family History  Adopted: Yes  Problem Relation Age of Onset   Scleroderma Daughter     Allergies  Allergen Reactions   Codeine Itching and Hives    Other reaction(s): itching internally   Amoxicillin-Pot Clavulanate Diarrhea   Erythromycin Base    Other     Other reaction(s): Unknown Other reaction(s): itching internally   Sulfa Antibiotics Itching     vomiting   Tramadol Itching   Tramadol Hcl     Other reaction(s): hives   Erythromycin Itching and Hives     Other reaction(s): itching internally    Current Outpatient Medications on File Prior to Visit  Medication Sig Dispense Refill   albuterol  (VENTOLIN  HFA) 108 (90 Base) MCG/ACT inhaler Inhale 1-2 puffs into the lungs every 6 (six) hours as needed (cough). 6.7 g 0   amLODipine  (NORVASC ) 2.5 MG tablet Take 1 tablet (2.5 mg total) by mouth daily. for blood pressure. 90 tablet 1   benzonatate  (TESSALON  PERLES) 100 MG capsule 1-2 capsules up to twice daily as needed for cough. 30 capsule 3   fluticasone (FLONASE) 50 MCG/ACT nasal spray Place 2 sprays into both nostrils daily as needed for allergies or rhinitis.     folic acid  (FOLVITE ) 1 MG tablet Take 2 tablets (2 mg total) by mouth daily. 180 tablet 3   InFLIXimab  (REMICADE  IV) Inject into  the vein every 6 (six) weeks.     methotrexate  (RHEUMATREX) 2.5 MG tablet TAKE 6 TABLETS(15 MG) BY MOUTH 1 TIME A WEEK 72 tablet 0   metoprolol  tartrate (LOPRESSOR ) 50 MG tablet TAKE 1 AND 1/2 TABLETS(75 MG) BY MOUTH TWICE DAILY 270 tablet 1   ondansetron  (ZOFRAN ) 4 MG tablet Take 1 tablet (4 mg total) by mouth every 6 (six) hours as needed. 30 tablet 0   pantoprazole  (PROTONIX ) 40 MG tablet TAKE 1 TABLET BY MOUTH ONCE DAILY for heartburn 90 tablet 2   pravastatin  (PRAVACHOL ) 40 MG tablet TAKE 1 TABLET(40 MG) BY MOUTH DAILY FOR CHOLESTEROL 90 tablet 0   SYSTANE ULTRA 0.4-0.3 % SOLN SMARTSIG:1 Drop(s) In Eye(s) 6 Times Daily     VITAMIN D PO Take 1,000 Units by mouth daily.     No current facility-administered medications on file prior to visit.    BP 132/72   Pulse (!) 56   Temp 97.8 F (36.6 C) (Oral)   Ht 5' 3.5 (1.613 m)   Wt 129 lb 6 oz (58.7 kg)   SpO2 98%   BMI 22.56 kg/m  Objective:   Physical Exam Cardiovascular:     Rate and Rhythm: Normal rate and regular rhythm.  Pulmonary:     Effort: Pulmonary effort is normal.     Breath sounds: Normal breath sounds.  Musculoskeletal:     Cervical back: Neck supple.  Skin:    General: Skin is  warm and dry.  Neurological:     Mental Status: She is alert and oriented to person, place, and time.  Psychiatric:        Mood and Affect: Mood normal.     Physical Exam        Assessment & Plan:  Type 2 diabetes mellitus with hyperglycemia, without long-term current use of insulin (HCC) Assessment & Plan: Well-controlled with A1c of 5.8 today!. Commended her on lifestyle changes. Remain off treatment.  Follow-up in 6 months.  Orders: -     POCT glycosylated hemoglobin (Hb A1C)  SVT (supraventricular tachycardia) Assessment & Plan: Controlled.  Continue metoprolol  75 mg BID. Following with cardiology, office notes reviewed from October 2025   Essential hypertension Assessment & Plan: Controlled overall.  Continue amlodipine  2.5 mg daily, metoprolol  tartrate 75 mg BID.    Gastroesophageal reflux disease without esophagitis Assessment & Plan: Controlled.  Continue pantoprazole  40 mg daily.   Sarcoid arthropathy [facility]) Assessment & Plan: Following with rheumatology, office notes reviewed from June 2025. Labs reviewed from September 2025.  Continue methotrexate  15 mg weekly, Remicade  infusions every 6 weeks.   Uveitis Assessment & Plan: Stable.  Continue methotrexate  15 mg weekly, Remicade  infusions every 6 weeks.   Tachycardia Assessment & Plan: Controlled.  Continue metoprolol  to tartrate 75 mg twice daily.   Palpitations Assessment & Plan: Controlled, no concerns today.  Continue metoprolol  tartrate 75 mg twice daily   Pure hypercholesterolemia Assessment & Plan: Repeat lipid panel pending. Continue pravastatin  40 mg daily  Orders: -     Lipid panel    Assessment and Plan Assessment & Plan         Comer MARLA Gaskins, NP     History of Present Illness

## 2024-08-22 NOTE — Assessment & Plan Note (Signed)
 Following with rheumatology, office notes reviewed from June 2025. Labs reviewed from September 2025.  Continue methotrexate  15 mg weekly, Remicade  infusions every 6 weeks.

## 2024-08-22 NOTE — Assessment & Plan Note (Signed)
 Controlled overall.  Continue amlodipine  2.5 mg daily, metoprolol  tartrate 75 mg BID.

## 2024-08-24 ENCOUNTER — Other Ambulatory Visit: Payer: Self-pay | Admitting: Neurosurgery

## 2024-08-24 DIAGNOSIS — M25511 Pain in right shoulder: Secondary | ICD-10-CM

## 2024-09-04 ENCOUNTER — Ambulatory Visit
Admission: RE | Admit: 2024-09-04 | Discharge: 2024-09-04 | Disposition: A | Discharge status: Home / Self Care | Source: Ambulatory Visit | Attending: Neurosurgery | Admitting: Neurosurgery

## 2024-09-04 DIAGNOSIS — S43401A Unspecified sprain of right shoulder joint, initial encounter: Secondary | ICD-10-CM | POA: Diagnosis not present

## 2024-09-04 DIAGNOSIS — M25511 Pain in right shoulder: Secondary | ICD-10-CM

## 2024-09-12 DIAGNOSIS — Z1231 Encounter for screening mammogram for malignant neoplasm of breast: Secondary | ICD-10-CM | POA: Diagnosis not present

## 2024-09-12 LAB — HM MAMMOGRAPHY

## 2024-09-18 ENCOUNTER — Inpatient Hospital Stay (HOSPITAL_COMMUNITY): Admission: RE | Admit: 2024-09-18 | Source: Ambulatory Visit

## 2024-09-18 ENCOUNTER — Encounter: Payer: Self-pay | Admitting: Primary Care

## 2024-09-19 DIAGNOSIS — M25511 Pain in right shoulder: Secondary | ICD-10-CM | POA: Diagnosis not present

## 2024-09-25 ENCOUNTER — Ambulatory Visit
Admission: RE | Admit: 2024-09-25 | Discharge: 2024-09-25 | Disposition: A | Discharge status: Home / Self Care | Source: Ambulatory Visit | Attending: Rheumatology | Admitting: Rheumatology

## 2024-09-25 DIAGNOSIS — K219 Gastro-esophageal reflux disease without esophagitis: Secondary | ICD-10-CM | POA: Insufficient documentation

## 2024-09-25 DIAGNOSIS — M51369 Other intervertebral disc degeneration, lumbar region without mention of lumbar back pain or lower extremity pain: Secondary | ICD-10-CM | POA: Insufficient documentation

## 2024-09-25 DIAGNOSIS — Z13820 Encounter for screening for osteoporosis: Secondary | ICD-10-CM | POA: Insufficient documentation

## 2024-09-25 DIAGNOSIS — M19041 Primary osteoarthritis, right hand: Secondary | ICD-10-CM | POA: Insufficient documentation

## 2024-09-25 DIAGNOSIS — R202 Paresthesia of skin: Secondary | ICD-10-CM | POA: Insufficient documentation

## 2024-09-25 DIAGNOSIS — D869 Sarcoidosis, unspecified: Secondary | ICD-10-CM | POA: Insufficient documentation

## 2024-09-25 DIAGNOSIS — R918 Other nonspecific abnormal finding of lung field: Secondary | ICD-10-CM | POA: Insufficient documentation

## 2024-09-25 DIAGNOSIS — Z9889 Other specified postprocedural states: Secondary | ICD-10-CM | POA: Insufficient documentation

## 2024-09-25 DIAGNOSIS — I10 Essential (primary) hypertension: Secondary | ICD-10-CM | POA: Insufficient documentation

## 2024-09-25 DIAGNOSIS — M19042 Primary osteoarthritis, left hand: Secondary | ICD-10-CM | POA: Insufficient documentation

## 2024-09-25 DIAGNOSIS — Z79899 Other long term (current) drug therapy: Secondary | ICD-10-CM | POA: Insufficient documentation

## 2024-09-25 DIAGNOSIS — M170 Bilateral primary osteoarthritis of knee: Secondary | ICD-10-CM | POA: Insufficient documentation

## 2024-09-25 DIAGNOSIS — H209 Unspecified iridocyclitis: Secondary | ICD-10-CM | POA: Insufficient documentation

## 2024-09-25 DIAGNOSIS — I471 Supraventricular tachycardia, unspecified: Secondary | ICD-10-CM | POA: Insufficient documentation

## 2024-09-25 NOTE — Progress Notes (Unsigned)
 Office Visit Note  Patient: Renee Jones             Date of Birth: [birth year]           MRN: [id number]             PCP: Gretta Comer POUR, NP Referring: Gretta Comer POUR, NP Visit Date: 10/09/2024 Occupation: Data Unavailable  Subjective:  No chief complaint on file.   History of Present Illness: Renee Jones is a 73 y.o. female ***     Activities of Daily Living:  Patient reports Renee stiffness for *** {minute/hour:19697}.   Patient {ACTIONS;DENIES/REPORTS:21021675::Denies} nocturnal pain.  Difficulty dressing/grooming: {ACTIONS;DENIES/REPORTS:21021675::Denies} Difficulty climbing stairs: {ACTIONS;DENIES/REPORTS:21021675::Denies} Difficulty getting out of chair: {ACTIONS;DENIES/REPORTS:21021675::Denies} Difficulty using hands for taps, buttons, cutlery, and/or writing: {ACTIONS;DENIES/REPORTS:21021675::Denies}  No Rheumatology ROS completed.   PMFS History:  Patient Active Problem List   Diagnosis Date Noted   Palpitations 11/02/2023   Chronic groin pain, right 11/02/2023   Hypokalemia 11/02/2023   Cervical spondylosis 11/12/2021   Hyperlipidemia 02/20/2021   Type 2 diabetes mellitus with hyperglycemia (HCC) 12/21/2018   High risk medications (not anticoagulants) long-term use 01/13/2017   GERD (gastroesophageal reflux disease) 09/14/2016   Glaucoma 09/14/2016   Osteoarthritis of both hands 09/14/2016   Osteoarthritis of both knees 09/14/2016   DDD (degenerative disc disease), lumbar 09/14/2016   Uveitis 06/22/2016   SVT (supraventricular tachycardia) 08/28/2015   Essential hypertension 08/28/2015   Diastolic dysfunction 08/28/2015   Sarcoidosis 09/30/2012   Tachycardia 09/30/2012   Sarcoid arthropathy (HCC) 09/30/2012    Past Medical History:  Diagnosis Date   Allergy    Asthma    Bronchial spasm 09/14/2016   DDD (degenerative disc disease), lumbar 09/14/2016   Detached retina 09/14/2016   Diastolic dysfunction  08/28/2015   Essential hypertension 08/28/2015   GERD (gastroesophageal reflux disease)    Glaucoma 09/14/2016   Neuromuscular disorder (HCC)    Osteoarthritis of both hands 09/14/2016   Osteoarthritis of both knees 09/14/2016   Persistent cough for 3 weeks or longer 03/09/2023   Post-traumatic headache 03/22/2024   Pulmonary nodules 09/30/2012   Pure hypercholesterolemia 02/20/2021   Sarcoidosis    with +lymph node biopsy   SVT (supraventricular tachycardia) 08/28/2015   Tachycardia    Uveitis     Family History  Adopted: Yes  Problem Relation Age of Onset   Scleroderma Daughter    Past Surgical History:  Procedure Laterality Date   CATARACT EXTRACTION, BILATERAL  2018   CYSTECTOMY     EYE SURGERY Bilateral 2018   cataract removal    LUMBAR SPINE SURGERY  2009   ROTATOR CUFF REPAIR Right 2011   ROTATOR CUFF REPAIR Left 06/2018   TONSILLECTOMY AND ADENOIDECTOMY  1962   TOTAL ABDOMINAL HYSTERECTOMY  1984   Social History   Tobacco Use   Smoking status: Never    Passive exposure: Never   Smokeless tobacco: Never  Vaping Use   Vaping status: Never Used  Substance Use Topics   Alcohol use: No    Alcohol/week: 0.0 standard drinks of alcohol   Drug use: No   Social History   Social History Narrative   Widower.   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   Moved from [place] .     Immunization History  Administered Date(s) Administered   DTaP 07/26/2010   Fluzone Influenza virus vaccine,trivalent (IIV3), split virus 07/14/2010   Influenza Split 07/19/2014   Influenza, Seasonal, Injecte, Preservative Fre 07/21/2011   Influenza,inj,Quad PF,6+ Mos 07/19/2016,  12/21/2018   Influenza-Unspecified 07/09/2019   Pneumococcal Conjugate-13 01/21/2015   Pneumococcal Polysaccharide-23 09/19/2012, 09/21/2012   Pneumococcal-Unspecified 07/26/2012, 01/18/2015   Td 03/31/2010   Tdap 05/07/2010   Varicella 09/19/2012   Zoster, Live 07/26/2012, 09/21/2012      Objective: Vital Signs: There were no vitals taken for this visit.   Physical Exam   Musculoskeletal Exam: ***  CDAI Exam: CDAI Score: -- Patient Global: --; Provider Global: -- Swollen: --; Tender: -- Joint Exam 10/09/2024   No joint exam has been documented for this visit   There is currently no information documented on the homunculus. Go to the Rheumatology activity and complete the homunculus joint exam.  Investigation: No additional findings.  Imaging: MR SHOULDER RIGHT WO CONTRAST Result Date: 09/07/2024 CLINICAL DATA:  Right shoulder pain. History of rotator cuff repair in 2011. EXAM: MRI OF THE RIGHT SHOULDER WITHOUT CONTRAST TECHNIQUE: Multiplanar, multisequence MR imaging of the shoulder was performed. No intravenous contrast was administered. COMPARISON:  Right shoulder radiographs dated 08/17/2024. FINDINGS: Rotator cuff: Evidence of prior rotator cuff repair with suture anchors in the humeral head near the level of the supraspinatus and subscapularis tendon insertions. Mild tendinosis of the subscapularis and supraspinatus tendons. Region of linear T2 hyperintense signal along the articular margin of the supraspinatus tendon insertion, near the critical zone, could reflect sequela of postsurgical change or low-grade partial-thickness articular sided tear. Moderate tendinosis of the infraspinatus tendon. Teres minor tendon is intact. Muscles: No muscle atrophy or edema. Biceps Long Head: The intra-articular portion of the biceps tendon is not clearly visualized, which could reflect sequela of prior surgery or chronic retracted tear. Acromioclavicular Joint: Mild arthropathy of the acromioclavicular joint. Redemonstrated 7 x 6 mm well corticated ossicle at the superior aspect of the acromioclavicular joint. Fluid within the subacromial/subdeltoid space, suggestive of bursitis. Glenohumeral Joint: No joint effusion. No chondral defect. Labrum: Degeneration of the superior labrum  with tear, although, evaluation is limited by lack of intraarticular fluid/contrast. Bones: No acute fracture or dislocation. No marrow replacing lesion. Suture anchors within the humeral head. Other: None. IMPRESSION: 1. Evidence of prior rotator cuff repair with suture anchors in the humeral head near the level of the supraspinatus and subscapularis tendon insertions. Mild tendinosis of the subscapularis and supraspinatus tendons. Region of linear hyperintense signal along the articular margin of the supraspinatus tendon insertion, near the critical zone, could reflect sequela of postsurgical change or low-grade partial-thickness articular sided tear. Moderate tendinosis of the infraspinatus tendon. 2. The intra-articular portion of the biceps tendon is not clearly visualized, which could reflect sequela of prior surgery or chronic retracted tear. 3. Fluid within the subacromial/subdeltoid space, suggestive of bursitis. 4. Degeneration of the superior labrum with tear. 5. Mild arthropathy of the acromioclavicular joint with well-corticated ossicle at the superior joint space. Electronically Signed   By: Harrietta Sherry M.D.   On: 09/07/2024 14:48    Recent Labs: Lab Results  Component Value Date   WBC 8.1 06/26/2024   HGB 14.0 06/26/2024   PLT 277 06/26/2024   NA 141 06/26/2024   K 3.9 06/26/2024   CL 107 06/26/2024   CO2 24 06/26/2024   GLUCOSE 134 (H) 06/26/2024   BUN 12 06/26/2024   CREATININE 0.83 06/26/2024   BILITOT 1.0 06/26/2024   ALKPHOS 64 06/26/2024   AST 25 06/26/2024   ALT 14 06/26/2024   PROT 6.7 06/26/2024   ALBUMIN 4.1 06/26/2024   CALCIUM  9.2 06/26/2024   GFRAA 79 05/20/2020   QFTBGOLD  Negative 06/16/2017   QFTBGOLDPLUS Negative 05/15/2024    Speciality Comments: REMICADE  3 mg/kg x 6 weeks-ACY 06/17/20   Procedures:  No procedures performed Allergies: Codeine, Amoxicillin-pot clavulanate, Erythromycin base, Other, Sulfa antibiotics, Tramadol, Tramadol hcl, and  Erythromycin   Assessment / Plan:     Visit Diagnoses: No diagnosis found.  Orders: No orders of the defined types were placed in this encounter.  No orders of the defined types were placed in this encounter.   Face-to-face time spent with patient was *** minutes. Greater than 50% of time was spent in counseling and coordination of care.  Follow-Up Instructions: No follow-ups on file.   Daved JAYSON Gavel, CMA  Note - This record has been created using Animal nutritionist.  Chart creation errors have been sought, but may not always  have been located. Such creation errors do not reflect on  the standard of medical care.

## 2024-09-25 NOTE — Progress Notes (Signed)
 Patient here today for IV Remicade ; however, had to start oral antibiotics for sinus infection on Saturday (09/23/24) for 10 days. Per Dr. Myrtis need to reschedule for 3-4 days after completion of antibiotics as long as symptoms are gone. Patient verbalized understanding. Rescheduled for 10/09/24 at 1000.

## 2024-09-28 ENCOUNTER — Ambulatory Visit: Admitting: Rheumatology

## 2024-09-29 DIAGNOSIS — K219 Gastro-esophageal reflux disease without esophagitis: Secondary | ICD-10-CM | POA: Diagnosis not present

## 2024-09-29 DIAGNOSIS — K5900 Constipation, unspecified: Secondary | ICD-10-CM | POA: Diagnosis not present

## 2024-09-29 DIAGNOSIS — I340 Nonrheumatic mitral (valve) insufficiency: Secondary | ICD-10-CM | POA: Diagnosis not present

## 2024-10-02 DIAGNOSIS — M25511 Pain in right shoulder: Secondary | ICD-10-CM | POA: Diagnosis not present

## 2024-10-02 DIAGNOSIS — M7541 Impingement syndrome of right shoulder: Secondary | ICD-10-CM | POA: Diagnosis not present

## 2024-10-09 ENCOUNTER — Ambulatory Visit

## 2024-10-09 ENCOUNTER — Ambulatory Visit (INDEPENDENT_AMBULATORY_CARE_PROVIDER_SITE_OTHER)

## 2024-10-09 ENCOUNTER — Ambulatory Visit: Admitting: Rheumatology

## 2024-10-09 ENCOUNTER — Inpatient Hospital Stay (HOSPITAL_COMMUNITY): Admission: RE | Admit: 2024-10-09 | Discharge: 2024-10-09 | Attending: Rheumatology

## 2024-10-09 ENCOUNTER — Ambulatory Visit: Payer: Self-pay | Admitting: Rheumatology

## 2024-10-09 ENCOUNTER — Telehealth: Payer: Self-pay

## 2024-10-09 ENCOUNTER — Encounter: Payer: Self-pay | Admitting: Rheumatology

## 2024-10-09 ENCOUNTER — Ambulatory Visit (HOSPITAL_COMMUNITY)
Admission: RE | Admit: 2024-10-09 | Discharge: 2024-10-09 | Disposition: A | Discharge status: Home / Self Care | Source: Ambulatory Visit | Attending: Rheumatology | Admitting: Rheumatology

## 2024-10-09 VITALS — BP 133/77 | HR 67 | Temp 97.5°F | Resp 16 | Wt 130.0 lb

## 2024-10-09 VITALS — BP 164/76 | HR 59 | Temp 97.2°F | Resp 15 | Ht 63.5 in | Wt 130.4 lb

## 2024-10-09 DIAGNOSIS — R918 Other nonspecific abnormal finding of lung field: Secondary | ICD-10-CM | POA: Diagnosis present

## 2024-10-09 DIAGNOSIS — M170 Bilateral primary osteoarthritis of knee: Secondary | ICD-10-CM | POA: Insufficient documentation

## 2024-10-09 DIAGNOSIS — Z13820 Encounter for screening for osteoporosis: Secondary | ICD-10-CM | POA: Diagnosis present

## 2024-10-09 DIAGNOSIS — D869 Sarcoidosis, unspecified: Secondary | ICD-10-CM

## 2024-10-09 DIAGNOSIS — Z9889 Other specified postprocedural states: Secondary | ICD-10-CM

## 2024-10-09 DIAGNOSIS — Z79899 Other long term (current) drug therapy: Secondary | ICD-10-CM | POA: Diagnosis not present

## 2024-10-09 DIAGNOSIS — M19042 Primary osteoarthritis, left hand: Secondary | ICD-10-CM | POA: Diagnosis present

## 2024-10-09 DIAGNOSIS — M51369 Other intervertebral disc degeneration, lumbar region without mention of lumbar back pain or lower extremity pain: Secondary | ICD-10-CM

## 2024-10-09 DIAGNOSIS — H209 Unspecified iridocyclitis: Secondary | ICD-10-CM

## 2024-10-09 DIAGNOSIS — R202 Paresthesia of skin: Secondary | ICD-10-CM

## 2024-10-09 DIAGNOSIS — I10 Essential (primary) hypertension: Secondary | ICD-10-CM | POA: Diagnosis not present

## 2024-10-09 DIAGNOSIS — I471 Supraventricular tachycardia, unspecified: Secondary | ICD-10-CM | POA: Diagnosis not present

## 2024-10-09 DIAGNOSIS — K219 Gastro-esophageal reflux disease without esophagitis: Secondary | ICD-10-CM | POA: Diagnosis not present

## 2024-10-09 DIAGNOSIS — M19041 Primary osteoarthritis, right hand: Secondary | ICD-10-CM

## 2024-10-09 LAB — CBC WITH DIFFERENTIAL/PLATELET
Abs Immature Granulocytes: 0.02 K/uL (ref 0.00–0.07)
Basophils Absolute: 0 K/uL (ref 0.0–0.1)
Basophils Relative: 0 %
Eosinophils Absolute: 0.1 K/uL (ref 0.0–0.5)
Eosinophils Relative: 1 %
HCT: 41.5 % (ref 36.0–46.0)
Hemoglobin: 13.8 g/dL (ref 12.0–15.0)
Immature Granulocytes: 0 %
Lymphocytes Relative: 43 %
Lymphs Abs: 3.6 K/uL (ref 0.7–4.0)
MCH: 30.7 pg (ref 26.0–34.0)
MCHC: 33.3 g/dL (ref 30.0–36.0)
MCV: 92.4 fL (ref 80.0–100.0)
Monocytes Absolute: 0.7 K/uL (ref 0.1–1.0)
Monocytes Relative: 8 %
Neutro Abs: 3.9 K/uL (ref 1.7–7.7)
Neutrophils Relative %: 48 %
Platelets: 312 K/uL (ref 150–400)
RBC: 4.49 MIL/uL (ref 3.87–5.11)
RDW: 13.8 % (ref 11.5–15.5)
WBC: 8.3 K/uL (ref 4.0–10.5)
nRBC: 0 % (ref 0.0–0.2)

## 2024-10-09 LAB — COMPREHENSIVE METABOLIC PANEL WITH GFR
ALT: 11 U/L (ref 0–44)
AST: 17 U/L (ref 15–41)
Albumin: 4.1 g/dL (ref 3.5–5.0)
Alkaline Phosphatase: 78 U/L (ref 38–126)
Anion gap: 7 (ref 5–15)
BUN: 15 mg/dL (ref 8–23)
CO2: 27 mmol/L (ref 22–32)
Calcium: 8.8 mg/dL — ABNORMAL LOW (ref 8.9–10.3)
Chloride: 107 mmol/L (ref 98–111)
Creatinine, Ser: 0.78 mg/dL (ref 0.44–1.00)
GFR, Estimated: >60 mL/min
Glucose, Bld: 117 mg/dL — ABNORMAL HIGH (ref 70–99)
Potassium: 4.2 mmol/L (ref 3.5–5.1)
Sodium: 140 mmol/L (ref 135–145)
Total Bilirubin: 0.4 mg/dL (ref 0.0–1.2)
Total Protein: 6.4 g/dL — ABNORMAL LOW (ref 6.5–8.1)

## 2024-10-09 MED ORDER — ACETAMINOPHEN 325 MG PO TABS: 650.0000 mg | ORAL_TABLET | Freq: Once | ORAL | Status: DC

## 2024-10-09 MED ORDER — SODIUM CHLORIDE 0.9 % IV SOLN
3.0000 mg/kg | Freq: Once | INTRAVENOUS | Status: AC
  Administered 2024-10-09: 200 mg via INTRAVENOUS
  Filled 2024-10-09: qty 20

## 2024-10-09 MED ORDER — DIPHENHYDRAMINE HCL 25 MG PO CAPS: 25.0000 mg | ORAL_CAPSULE | Freq: Once | ORAL | Status: DC

## 2024-10-09 NOTE — Telephone Encounter (Signed)
Please apply for bilateral knee visco, per Dr. Deveshwar. Thanks!  

## 2024-10-09 NOTE — Progress Notes (Signed)
 Glucose is mildly elevated, probably not a fasting sample.  Calcium  is low.  Patient should consume calcium  rich diet.  Total Calcium  intake should be about 1200 mg. CMP is normal.

## 2024-10-09 NOTE — Patient Instructions (Addendum)
 Vaccines You are taking a medication(s) that can suppress your immune system.  The following immunizations are recommended: Flu annually RSV Covid-19  Td/Tdap (tetanus, diphtheria, pertussis) every 10 years Pneumonia (Prevnar 15 then Pneumovax 23 at least 1 year apart.  Alternatively, can take Prevnar 20 without needing additional dose) Shingrix: 2 doses from 4 weeks to 6 months apart  Please check with your PCP to make sure you are up to date.   If you have signs or symptoms of an infection or start antibiotics: First, call your PCP for workup of your infection. Hold your medication through the infection, until you complete your antibiotics, and until symptoms resolve if you take the following: Injectable medication (Actemra, Benlysta, Cimzia, Cosentyx, Enbrel, Humira, Kevzara, Orencia, Remicade , Simponi, Stelara, Taltz, Tremfya) Methotrexate  Leflunomide (Arava) Mycophenolate (Cellcept) Earma, Olumiant, or Rinvoq   Please get an annual skin examination to screen for skin cancer while you are on Remicade  infusions.  Please use sunscreen and sun protection.

## 2024-10-13 ENCOUNTER — Other Ambulatory Visit: Payer: Self-pay | Admitting: Rheumatology

## 2024-10-16 NOTE — Telephone Encounter (Signed)
 Last Fill: 07/14/2024  Labs: 10/09/2024 Glucose is mildly elevated, probably not a fasting sample. Calcium  is low. Patient should consume calcium  rich diet. Total Calcium  intake should be about 1200 mg. CMP is normal.   Next Visit: due May 2026, message sent to the front desk to schedule.   Last Visit: 10/09/2024  DX: Sarcoidosis   Current Dose per office note on 10/09/2024: methotrexate  2.5 mg 6 tablets by mouth once weekly every 7 days   Okay to refill Methotrexate ?

## 2024-10-24 NOTE — Telephone Encounter (Signed)
 VOB submitted for Euflexxa, Bilateral knee(s) BV pending

## 2024-10-24 NOTE — Telephone Encounter (Signed)
 Please call to schedule visco injections.  Approved for Euflexxa, Bilateral knee(s). [employer] Deductible must be met before coverage applies ($716 / met 0). As per Medicare guidelines, a standard course of treatment for Euflexxa is 3 injections per knee in a 6 month period.   Additional injections may not be considered reasonable and necessary and may deny.   For services to be approved there must be radiological evidence to support the diagnosis of osteoarthritis; and patient must have tried and failed with conservative forms of treatment.   Plan may request medical notes upon receipt of claim.   Pre-certification not required.  Secondary Primary insurance deductible must be met before coverage applies.   This plan covers the 20% medicare coinsurance but not the Part B deductible.  Plan follows Medicare guidelines.   Pre-certification, Medical notes and referrals are not required.

## 2024-10-26 ENCOUNTER — Ambulatory Visit: Payer: Self-pay | Admitting: Rheumatology

## 2024-10-26 NOTE — Telephone Encounter (Signed)
 SCHEDULED: check box once inj arrives

## 2024-10-30 ENCOUNTER — Encounter (HOSPITAL_COMMUNITY)

## 2024-11-01 ENCOUNTER — Other Ambulatory Visit: Payer: Self-pay | Admitting: *Deleted

## 2024-11-01 DIAGNOSIS — E785 Hyperlipidemia, unspecified: Secondary | ICD-10-CM

## 2024-11-01 MED ORDER — PRAVASTATIN SODIUM 40 MG PO TABS: 40.0000 mg | ORAL_TABLET | Freq: Every day | ORAL | 1 refills | Status: AC

## 2024-11-06 ENCOUNTER — Encounter (HOSPITAL_COMMUNITY)

## 2024-11-15 ENCOUNTER — Telehealth (HOSPITAL_COMMUNITY): Payer: Self-pay

## 2024-11-15 NOTE — Telephone Encounter (Signed)
 Auth Submission: NO AUTH NEEDED Site of care: Site of care: [facility] Payer: Medicare A/B, [employer] Supplement Medication & CPT/J Code(s) submitted: Remicade  (Infliximab ) J1745 Diagnosis Code: D86.9, H20.9 Route of submission (phone, fax, portal):  Phone # Fax # Auth type: Buy/Bill HB Units/visits requested: 3mg /kg q6weeks Reference number:  Approval from: 11/19/24 to 11/18/25

## 2024-11-20 ENCOUNTER — Inpatient Hospital Stay (HOSPITAL_COMMUNITY): Admission: RE | Admit: 2024-11-20 | Source: Ambulatory Visit

## 2024-11-28 ENCOUNTER — Ambulatory Visit: Admitting: Primary Care

## 2024-12-04 ENCOUNTER — Encounter (HOSPITAL_COMMUNITY)

## 2024-12-26 ENCOUNTER — Ambulatory Visit: Attending: Rheumatology | Admitting: Physician Assistant

## 2025-01-01 ENCOUNTER — Encounter (HOSPITAL_COMMUNITY)

## 2025-01-02 ENCOUNTER — Ambulatory Visit: Admitting: Physician Assistant

## 2025-01-09 ENCOUNTER — Ambulatory Visit: Admitting: Physician Assistant

## 2025-01-15 ENCOUNTER — Encounter (HOSPITAL_COMMUNITY)

## 2025-02-20 ENCOUNTER — Ambulatory Visit: Admitting: Primary Care

## 2025-03-16 ENCOUNTER — Ambulatory Visit: Attending: Rheumatology | Admitting: Rheumatology

## 2025-04-18 ENCOUNTER — Ambulatory Visit
